# Patient Record
Sex: Male | Born: 1945
Health system: Southern US, Community
[De-identification: ages and names within clinical notes are randomized; demographics above are authoritative.]

## PROBLEM LIST (undated history)

## (undated) DIAGNOSIS — J45909 Unspecified asthma, uncomplicated: Secondary | ICD-10-CM

## (undated) DIAGNOSIS — H269 Unspecified cataract: Secondary | ICD-10-CM

## (undated) DIAGNOSIS — J8410 Pulmonary fibrosis, unspecified: Secondary | ICD-10-CM

## (undated) DIAGNOSIS — J84112 Idiopathic pulmonary fibrosis: Secondary | ICD-10-CM

## (undated) DIAGNOSIS — R0600 Dyspnea, unspecified: Secondary | ICD-10-CM

## (undated) DIAGNOSIS — H9190 Unspecified hearing loss, unspecified ear: Secondary | ICD-10-CM

## (undated) DIAGNOSIS — I4891 Unspecified atrial fibrillation: Secondary | ICD-10-CM

## (undated) DIAGNOSIS — R079 Chest pain, unspecified: Secondary | ICD-10-CM

## (undated) DIAGNOSIS — K219 Gastro-esophageal reflux disease without esophagitis: Secondary | ICD-10-CM

## (undated) DIAGNOSIS — E785 Hyperlipidemia, unspecified: Secondary | ICD-10-CM

## (undated) DIAGNOSIS — H409 Unspecified glaucoma: Secondary | ICD-10-CM

## (undated) DIAGNOSIS — M25512 Pain in left shoulder: Secondary | ICD-10-CM

## (undated) DIAGNOSIS — M1990 Unspecified osteoarthritis, unspecified site: Secondary | ICD-10-CM

## (undated) DIAGNOSIS — I219 Acute myocardial infarction, unspecified: Secondary | ICD-10-CM

## (undated) DIAGNOSIS — M47812 Spondylosis without myelopathy or radiculopathy, cervical region: Secondary | ICD-10-CM

## (undated) DIAGNOSIS — Z789 Other specified health status: Secondary | ICD-10-CM

## (undated) DIAGNOSIS — I10 Essential (primary) hypertension: Secondary | ICD-10-CM

## (undated) DIAGNOSIS — L409 Psoriasis, unspecified: Secondary | ICD-10-CM

## (undated) HISTORY — DX: Chest pain, unspecified: R07.9

## (undated) HISTORY — DX: Pain in left shoulder: M25.512

## (undated) HISTORY — DX: Spondylosis without myelopathy or radiculopathy, cervical region: M47.812

## (undated) HISTORY — DX: Hyperlipidemia, unspecified: E78.5

## (undated) HISTORY — PX: NECK SURGERY: SHX720

## (undated) HISTORY — PX: COLONOSCOPY: SHX174

## (undated) HISTORY — DX: Gastro-esophageal reflux disease without esophagitis: K21.9

## (undated) HISTORY — DX: Pulmonary fibrosis, unspecified: J84.10

## (undated) HISTORY — DX: Psoriasis, unspecified: L40.9

## (undated) HISTORY — DX: Other specified health status: Z78.9

## (undated) HISTORY — DX: Unspecified cataract: H26.9

## (undated) HISTORY — DX: Unspecified hearing loss, unspecified ear: H91.90

## (undated) HISTORY — PX: BRAIN SURGERY: SHX531

---

## 2000-11-04 ENCOUNTER — Ambulatory Visit (HOSPITAL_COMMUNITY): Admission: RE | Admit: 2000-11-04 | Discharge: 2000-11-04 | Payer: Self-pay | Admitting: Family Medicine

## 2000-11-04 ENCOUNTER — Encounter: Payer: Self-pay | Admitting: Family Medicine

## 2000-11-06 ENCOUNTER — Ambulatory Visit (HOSPITAL_COMMUNITY): Admission: RE | Admit: 2000-11-06 | Discharge: 2000-11-07 | Payer: Self-pay | Admitting: Neurosurgery

## 2008-11-16 ENCOUNTER — Ambulatory Visit (HOSPITAL_COMMUNITY): Admission: RE | Admit: 2008-11-16 | Discharge: 2008-11-16 | Payer: Self-pay | Admitting: Family Medicine

## 2009-01-07 DIAGNOSIS — M25512 Pain in left shoulder: Secondary | ICD-10-CM

## 2009-01-07 HISTORY — DX: Pain in left shoulder: M25.512

## 2009-03-17 ENCOUNTER — Ambulatory Visit (HOSPITAL_COMMUNITY): Admission: RE | Admit: 2009-03-17 | Discharge: 2009-03-17 | Payer: Self-pay | Admitting: Family Medicine

## 2009-03-23 ENCOUNTER — Ambulatory Visit (HOSPITAL_COMMUNITY): Admission: RE | Admit: 2009-03-23 | Discharge: 2009-03-23 | Payer: Self-pay | Admitting: Family Medicine

## 2009-04-27 ENCOUNTER — Ambulatory Visit (HOSPITAL_COMMUNITY): Admission: RE | Admit: 2009-04-27 | Discharge: 2009-04-27 | Payer: Self-pay | Admitting: Family Medicine

## 2009-08-15 ENCOUNTER — Ambulatory Visit (HOSPITAL_COMMUNITY): Admission: RE | Admit: 2009-08-15 | Discharge: 2009-08-15 | Payer: Self-pay | Admitting: Family Medicine

## 2009-10-19 ENCOUNTER — Ambulatory Visit (HOSPITAL_COMMUNITY)
Admission: RE | Admit: 2009-10-19 | Discharge: 2009-10-19 | Payer: Self-pay | Source: Home / Self Care | Admitting: Family Medicine

## 2009-12-08 ENCOUNTER — Ambulatory Visit (HOSPITAL_COMMUNITY)
Admission: RE | Admit: 2009-12-08 | Discharge: 2009-12-08 | Payer: Self-pay | Source: Home / Self Care | Admitting: Neurosurgery

## 2009-12-15 ENCOUNTER — Ambulatory Visit (HOSPITAL_COMMUNITY)
Admission: RE | Admit: 2009-12-15 | Discharge: 2009-12-15 | Payer: Self-pay | Source: Home / Self Care | Attending: Neurosurgery | Admitting: Neurosurgery

## 2010-01-07 DIAGNOSIS — J8410 Pulmonary fibrosis, unspecified: Secondary | ICD-10-CM

## 2010-01-07 HISTORY — DX: Pulmonary fibrosis, unspecified: J84.10

## 2010-01-28 ENCOUNTER — Encounter: Payer: Self-pay | Admitting: Family Medicine

## 2010-02-01 ENCOUNTER — Ambulatory Visit: Admit: 2010-02-01 | Payer: Self-pay | Admitting: Pulmonary Disease

## 2010-02-16 ENCOUNTER — Institutional Professional Consult (permissible substitution): Payer: Self-pay | Admitting: Pulmonary Disease

## 2010-03-16 ENCOUNTER — Institutional Professional Consult (permissible substitution) (INDEPENDENT_AMBULATORY_CARE_PROVIDER_SITE_OTHER): Payer: Medicare PPO | Admitting: Pulmonary Disease

## 2010-03-16 ENCOUNTER — Other Ambulatory Visit: Payer: Self-pay | Admitting: Pulmonary Disease

## 2010-03-16 ENCOUNTER — Encounter: Payer: Self-pay | Admitting: Pulmonary Disease

## 2010-03-16 ENCOUNTER — Other Ambulatory Visit: Payer: Medicare PPO

## 2010-03-16 DIAGNOSIS — L409 Psoriasis, unspecified: Secondary | ICD-10-CM | POA: Insufficient documentation

## 2010-03-16 DIAGNOSIS — R0989 Other specified symptoms and signs involving the circulatory and respiratory systems: Secondary | ICD-10-CM

## 2010-03-16 DIAGNOSIS — J8410 Pulmonary fibrosis, unspecified: Secondary | ICD-10-CM

## 2010-03-16 DIAGNOSIS — J309 Allergic rhinitis, unspecified: Secondary | ICD-10-CM | POA: Insufficient documentation

## 2010-03-16 DIAGNOSIS — R0609 Other forms of dyspnea: Secondary | ICD-10-CM | POA: Insufficient documentation

## 2010-03-16 LAB — SEDIMENTATION RATE: Sed Rate: 20 mm/hr (ref 0–22)

## 2010-03-20 ENCOUNTER — Telehealth (INDEPENDENT_AMBULATORY_CARE_PROVIDER_SITE_OTHER): Payer: Self-pay | Admitting: *Deleted

## 2010-03-27 ENCOUNTER — Ambulatory Visit (INDEPENDENT_AMBULATORY_CARE_PROVIDER_SITE_OTHER): Payer: Medicare PPO | Admitting: Pulmonary Disease

## 2010-03-27 DIAGNOSIS — J8410 Pulmonary fibrosis, unspecified: Secondary | ICD-10-CM

## 2010-03-27 LAB — PULMONARY FUNCTION TEST

## 2010-03-27 NOTE — Progress Notes (Signed)
Summary: returning call  Phone Note Call from Patient Call back at Home Phone [phone]   Caller: Patient Call For: clance Summary of Call: Returning Megan's call. Initial call taken by: Darletta Moll,  March 20, 2010 12:40 PM  Follow-up for Phone Call        Spoke with pt and notified of the lab results per [facility] append.  Pt verbalized understanding and denied any questions.  Follow-up by: Vernie Murders,  March 20, 2010 12:48 PM

## 2010-03-27 NOTE — Progress Notes (Signed)
PFT performed today. 

## 2010-04-04 ENCOUNTER — Encounter: Payer: Self-pay | Admitting: Pulmonary Disease

## 2010-04-04 DIAGNOSIS — J8410 Pulmonary fibrosis, unspecified: Secondary | ICD-10-CM

## 2010-04-05 ENCOUNTER — Telehealth: Payer: Self-pay | Admitting: Pulmonary Disease

## 2010-04-05 NOTE — Assessment & Plan Note (Signed)
Summary: consult for evaluation of ISLD   Visit Type:  Initial Consult Copy to:  Lilyan Punt Primary Provider/Referring Provider:  Lilyan Punt  CC:  Pulmonary Consult.  Pt c/o difficulty breathing x 1 year.  Pt states he has "scar tissue in his lungs." .  History of Present Illness: the pt is a 65y/o male who I have been asked to see for ISLD noted on ct chest.  The pt has doe over the last 5-68yrs, and has been especially progressive over the last 2 yrs.  He has doe primarily with heavier exertional activities, but at times can get winded with lighter activities.  He thinks he can walk a mile or more at moderate pace without getting sob, but will get winded walking up one flight of stairs.  He denies chronic cough, but can have intermittantly that is dry in nature.  He denies any significant occupational exposures, though he did have very limited asbestos exposure while in the service (no intense or prolonged exposure).  He denies any symptoms c/w autoimmune disease, though does have joint discomfort.  He has never been on macrodantin or amiodarone.  He has had spirometry which shows no obstruction and definite restriction.  His ct chest shows subpleural fibrotic changes with some pleural thickening (no calcified pleural plaque).    Preventive Screening-Counseling & Management  Alcohol-Tobacco     Smoking Status: quit  Current Medications (verified): 1)  Advair Diskus 500-50 Mcg/dose  Misc (Fluticasone-Salmeterol) .... Use As Needed 2)  Aleve 220 Mg Tabs (Naproxen Sodium) .... Take 2 Tabs By Mouth Daily 3)  Prilosec Otc 20 Mg Tbec (Omeprazole Magnesium) .... Take 1 Tab By Mouth Every Other Day 4)  Hydrochlorothiazide 25 Mg Tabs (Hydrochlorothiazide) .... Take 1 Tablet By Mouth Once A Day 5)  Ventolin Hfa 108 (90 Base) Mcg/act  Aers (Albuterol Sulfate) .Marland Kitchen.. 1-2 Puffs Every 4-6 Hours As Needed  Allergies (verified): 1)  ! Prednisone  Past History:  Past Medical History:  ALLERGIC  RHINITIS (ICD-477.9) HYPERLIPIDEMIA (ICD-272.4) HYPERTENSION (ICD-401.9) PSORIASIS (ICD-696.1)    Past Surgical History: neck surgery d/t ruptured disc 2002  Family History: Reviewed history and no changes required. allergies: mother, brother, sisters asthma: mother rheumatism: mother, sisters cancer: sister (breast)   Social History: Reviewed history and no changes required. Patient states former smoker.  started at age 63.  1/2 ppd.  Quit Fall 2009.  pt is married and lives with spouse. pt has children. pt is a retired Music therapist. Smoking Status:  quit  Review of Systems       The patient complains of shortness of breath with activity, productive cough, headaches, nasal congestion/difficulty breathing through nose, hand/feet swelling, joint stiffness or pain, and rash.  The patient denies shortness of breath at rest, non-productive cough, coughing up blood, chest pain, irregular heartbeats, acid heartburn, indigestion, loss of appetite, weight change, abdominal pain, difficulty swallowing, sore throat, tooth/dental problems, sneezing, itching, ear ache, anxiety, depression, change in color of mucus, and fever.    Vital Signs:  Patient profile:   65 year old male Height:      73 inches Weight:      266.38 pounds BMI:     35.27 O2 Sat:      91 % on Room air Temp:     97.9 degrees F oral Pulse rate:   79 / minute BP sitting:   140 / 82  (left arm) Cuff size:   large  Vitals Entered By: Arman Filter LPN (March 16, 2010 2:30 PM)  O2 Flow:  Room air  Serial Vital Signs/Assessments:  Comments: 3:53 PM Ambulatory Pulse Oximetry  Resting; HR__89___    02 Sat_93%ra____  Lap1 (185 feet)   HR___104__   02 Sat__93%ra___ Lap2 (185 feet)   HR___108__   02 Sat___92%ra__    Lap3 (185 feet)   HR___112__   02 Sat__86%ra___  _X__Test Completed without Difficulty ___Test Stopped due to:  Arman Filter LPN  March 15, 9145 3:54 PM  By: Arman Filter LPN   CC: Pulmonary  Consult.  Pt c/o difficulty breathing x 1 year.  Pt states he has "scar tissue in his lungs."  Comments Medications reviewed with patient Arman Filter LPN  March 15, 8293 2:43 PM    Physical Exam  General:  ow male in nad  Eyes:  PERRLA and EOMI.   Nose:  patent with mild septal deviation to left Mouth:  clear, no exudates or lesions seen  Neck:  no jvd, tmg, LN Lungs:  crackles 1/3 up bilat, no wheezing  Heart:  rrr, no mrg  Abdomen:  soft and nontender, bs+ Extremities:  mild ankle edema, no cyanosis  pulses intact distally Neurologic:  alert and oriented, moves all 4  Skin:  psoriasis changes on UE, LE   Impression & Recommendations:  Problem # 1:  INTERSTITIAL LUNG DISEASE (ICD-515) the pt has ISLD on his ct chest that is in a pattern most suspicious for IPF.  However, he did have some asbestos exposure in the past, and has some areas of pleural thickening.  However, the pattern of fibrosis is not typical for that seen with asbestosis.  Although the pt is symptomatic, he still maintains a very high QOL.  At this point, would like to establish his baseline with full pfts, and also r/o possible autoimmune process.  I have had a long discussion with him about ISLD, the different causes, and some of the longterm ramifications.  If this is IPF, the only treatment shown to be effective at this time is transplant.  Obviously, he is not at that point currently.  I also have discussed with him the ongoing trials with the drug pirfenadone, accepted in [place] as a disease "stabilizer", but requiring additional trials in the [place] before being accepted.  Our practice is participating in the trial, and offered him the option of being screened for this.  He will consider.  He did desaturate in the office today with rapid ambulation.  I offered him supplemental oxygen for home with heavier exertional activities, but he did not think he would wear it.  Will call if he changes his mind.  Finally, would  consider possible referral to pulmonary rehab at [facility], but the pt feels he is motivated enough on his own.  Medications Added to Medication List This Visit: 1)  Advair Diskus 500-50 Mcg/dose Misc (Fluticasone-salmeterol) .... Use as needed 2)  Aleve 220 Mg Tabs (Naproxen sodium) .... Take 2 tabs by mouth daily 3)  Prilosec Otc 20 Mg Tbec (Omeprazole magnesium) .... Take 1 tab by mouth every other day 4)  Hydrochlorothiazide 25 Mg Tabs (Hydrochlorothiazide) .... Take 1 tablet by mouth once a day 5)  Ventolin Hfa 108 (90 Base) Mcg/act Aers (Albuterol sulfate) .Marland Kitchen.. 1-2 puffs every 4-6 hours as needed  Other Orders: Consultation Level V (62130) T-Angiotensin i-Converting Enzyme (86578-46962) T-Antinuclear Antib (ANA) (95284-13244) T-Rheumatoid Factor (01027-25366) TLB-Sedimentation Rate (ESR) (85652-ESR) Pulse Oximetry, Ambulatory (44034)  Patient Instructions: 1)  will check bloodwork today to  evaluate for autoimmune disease 2)  will schedule for full breathing studies, and call you with the results. 3)  work on weight loss, exercise program 4)  followup with me in 6mos, or sooner if you feel your breathing is worsening.

## 2010-04-05 NOTE — Telephone Encounter (Signed)
Called and spoke with pt.  Pt aware of PFT results and KC's recs.  Pt verbalized understanding and denied any questions.   

## 2010-04-05 NOTE — Telephone Encounter (Signed)
Let pt know that his breathing tests show his lungs are moderately restricted, as expected.  Keep apptm with me as discussed.

## 2010-04-11 ENCOUNTER — Encounter: Payer: Self-pay | Admitting: Pulmonary Disease

## 2010-05-25 NOTE — Op Note (Signed)
[place]  Patient:    Curtis [name], Curtis [name] Visit Number: [id number] MRN: [id number]          Service Type: DSU Location: [id number] Attending Physician:  Cristi Loron Dictated by:   Cristi Loron, M.D. Proc. Date: 11/06/00 Admit Date:  11/06/2000 Discharge Date: 11/07/2000                             Operative Report  PREOPERATIVE DIAGNOSES:  C4-5 herniated nucleus pulposus, spinal stenosis, degenerative disk disease, cervical radiculopathy, cervicalgia.  POSTOPERATIVE DIAGNOSES:  C4-5 herniated nucleus pulposus, spinal stenosis, degenerative disk disease, cervical radiculopathy, cervicalgia.  PROCEDURES:  C4-5 extensive anterior cervical diskectomy, interbody iliac crest allograft arthrodesis, anterior cervical plating (Codman titanium plate and screws).  SURGEON:  Cristi Loron, M.D.  ASSISTANT:  Stefani Dama, M.D.  ANESTHESIA:  General endotracheal.  ESTIMATED BLOOD LOSS:  100 cc.  SPECIMENS:  None.  DRAINS:  None.  COMPLICATIONS:  None.  BRIEF HISTORY:  This patient is a 65 year old white male who has suffered from approximately two weeks of severe neck and left shoulder pain.  He has developed significant left deltoid weakness approximately six days ago.  He has been worked up with a cervical MRI, which demonstrated a large herniated disk at C4-5 on the left as well as degenerative disk disease and spondylosis at C5-6 and C6-7, predominantly anteriorly.  I recommended surgery at C4-5, and the patient weighed the risks, benefits, and alternatives of surgery and decided to proceed with the operation.  DESCRIPTION OF PROCEDURE:  The patient was brought to the operating room by the anesthesia team.  General endotracheal anesthesia was induced.  The patient remained in supine position.  A roll was placed under the patients shoulders to place his neck in slight extension.  The anterior cervical region was  then prepared with Betadine scrub and Betadine solution, and sterile drapes were applied.  I then injected the area to be incised with Marcaine with epinephrine solution and used a scalpel to make a transverse incision in the patients left anterior neck.  I used the Metzenbaum scissors to divide the platysma muscle and dissect medial to the sternocleidomastoid muscle, jugular vein, and carotid artery.  I bluntly dissected down toward the anterior cervical spine, carefully identifying the esophagus and retracting it medially.  I cleared soft tissue from the exposed interspace using the Kitner swabs.  I inserted a bent spinal needle into the exposed interspace and obtained the intraoperative radiograph.  It demonstrated we were at C4-5.  I used the electrocautery to detach the medial border of the longus colli muscle bilaterally from the C4-5 interspace.  Of note, there were large bone spurs anteriorly at C5-6 and C6-7.  I inserted the Caspar self-retaining retractor for exposure, and then I incised the C4-5 intervertebral disk with the 15 blade scalpel.  I performed a partial diskectomy using the pituitary forceps. I then inserted the distraction screws at C4 and C5, distracted the interspace, and then I used the high-speed drill to decorticate the vertebral end plates at X3-2, drill away the remainder of the C4-5 intervertebral disk. I thinned out the posterior longitudinal ligament with the drill, incised it with the arachnoid knife, and removed it with the Kerrison punch, undercutting the vertebral end plates at G4-0, decompressing the thecal sac.  There was a large herniated disk at C4-5 on the left, which I removed with a pituitary  forceps and the Kerrison punch.  I performed a foraminotomy about the bilateral C5 nerve root.  At this point I had a good decompression of the thecal sac and the bilateral C5 nerve roots.  I now turned my attention to the arthrodesis.  I obtained the iliac  crest tricortical allograft bone graft and fashioned it to these approximate dimensions:  8 mm in height, 1 cm in depth.  I then inserted the bone graft into the distracted C4-5 interspace.  I removed the distraction screws.  There was a good, snug fit of the bone graft.  I now turned my attention to the anterior spinal instrumentation and obtained the appropriate length Codman anterior cervical plate, laid it along the anterior aspect of C4 and C5 after drilling away some bone spurs from the C5 vertebral body anteriorly.  I drilled two holes at C4, two at C5, tapped the holes, and secured the plate to the vertebral bodies with two 15 mm screws at each level.  I then obtained the intraoperative radiograph, and it demonstrated good position of the plate, screws, and interbody graft at C4-5. I then secured the screws to the plate using the cam tightener at each screw. I then achieved stringent hemostasis using bipolar electrocautery and Gelfoam. I copiously irrigated the wound with bacitracin solution.  I removed the solution and then removed the Caspar self-retaining retractor and inspected the esophagus for any damage.  There was none apparent.  I then reapproximated the patients platysma muscle with interrupted 3-0 Vicryl suture, subcutaneous tissue with interrupted 3-0 Vicryl suture, and the skin with Steri-Strips and benzoin.  The wound was then coated with bacitracin ointment and a sterile dressing was applied, the drapes were removed, and the patient was subsequently extubated by the anesthesia team and transported to the postanesthesia care unit in stable condition.  All sponge, instrument, and needle counts were correct at the end of this case. Dictated by:   Cristi Loron, M.D. Attending Physician:  Tressie Stalker D DD:  11/06/00 TD:  11/08/00 Job: [id number]

## 2010-05-30 ENCOUNTER — Telehealth: Payer: Self-pay | Admitting: Pulmonary Disease

## 2010-05-30 DIAGNOSIS — R0602 Shortness of breath: Secondary | ICD-10-CM

## 2010-05-30 NOTE — Telephone Encounter (Signed)
I spoke to the pt and he states he will wear the oxygen with activity, so per [facility] send order for 2 liters with exertion. Order placed. Pt aware.Carron Curie, CMA

## 2010-05-30 NOTE — Telephone Encounter (Signed)
Spouse advised pt is c/o DOE with the warmer days and is requesting O2 therapy. Per KC last OV from 03/12 "He did desaturate in the office today with rapid ambulation. I offered him supplemental oxygen for home with heavier exertional activities, but he did not think he would wear it. Will call if he changes his mind." Pt does not have a DME. Please advise. Thank you.

## 2010-05-30 NOTE — Telephone Encounter (Signed)
The pt's spouse is requesting oxygen, but will the pt wear it.  Please ask the pt if he will wear it, and if so will send an order to [facility].

## 2010-05-30 NOTE — Telephone Encounter (Signed)
ATC pt line busy and no option to leave msg

## 2010-05-30 NOTE — Telephone Encounter (Signed)
PLEASE CALL BACK AT [phone]

## 2010-05-30 NOTE — Telephone Encounter (Signed)
Also advise spouse that she will receive a call back tomorrow, she is okay with this and can be reached at her work # [phone].

## 2010-06-01 ENCOUNTER — Ambulatory Visit (INDEPENDENT_AMBULATORY_CARE_PROVIDER_SITE_OTHER): Payer: Medicare PPO | Admitting: Pulmonary Disease

## 2010-06-01 ENCOUNTER — Ambulatory Visit: Payer: Medicare PPO | Admitting: Pulmonary Disease

## 2010-06-01 ENCOUNTER — Encounter: Payer: Self-pay | Admitting: Pulmonary Disease

## 2010-06-01 DIAGNOSIS — R0602 Shortness of breath: Secondary | ICD-10-CM

## 2010-06-01 NOTE — Progress Notes (Signed)
SATURATION QUALIFICATIONS:  Patient Saturations on Room Air at Rest = 93%  Patient Saturations on Room Air while Ambulating = 86%  Patient Saturations on 2 Liters of oxygen while Ambulating = 93%  Arman Filter, LPN

## 2010-06-01 NOTE — Progress Notes (Signed)
SATURATION QUALIFICATIONS:  Patient Saturations on Room Air at Rest = 93%  Patient Saturations on Room Air while Ambulating = 86%  Patient Saturations on 2 Liters of oxygen while Ambulating = 93%  Romain Erion, LPN  

## 2010-06-14 ENCOUNTER — Telehealth: Payer: Self-pay | Admitting: *Deleted

## 2010-06-15 NOTE — Telephone Encounter (Signed)
Faxed notes.Curtis [name]

## 2010-09-19 ENCOUNTER — Encounter: Payer: Self-pay | Admitting: Pulmonary Disease

## 2010-09-19 ENCOUNTER — Ambulatory Visit (INDEPENDENT_AMBULATORY_CARE_PROVIDER_SITE_OTHER): Payer: Medicare PPO | Admitting: Pulmonary Disease

## 2010-09-19 VITALS — BP 140/82 | HR 81 | Temp 97.8°F | Ht 73.0 in | Wt 271.0 lb

## 2010-09-19 DIAGNOSIS — J8410 Pulmonary fibrosis, unspecified: Secondary | ICD-10-CM

## 2010-09-19 NOTE — Assessment & Plan Note (Addendum)
The patient has interstitial lung disease of unknown origin, but continues to be very functional and has seen little change overall in his breathing.  At this point, both he and I would like to follow him clinically with yearly chest x-rays and PFTs, and he will let us know if subjectively his symptoms worsen.  I suspect this is IPF, and therefore no good treatment is available other than transplantation.  The patient is not interested in the ascend trial at this time.

## 2010-09-19 NOTE — Patient Instructions (Signed)
Will check cxr next visit Will schedule for breathing tests in 6mos, and see you back same day for review. Stay active, and call if you feel your breathing is becoming an issue.

## 2010-09-19 NOTE — Progress Notes (Signed)
  Subjective:    Patient ID: Curtis [name], male    DOB: 03/24/1945, 65 y.o.   MRN: [id number]  HPI The pt comes in today for f/u of his known ISLD or unknown origin.  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  He had a little more shortness of breath this summer in the high heat and humidity, but this is much improved since the temperatures have gotten cooler.  He denies any significant cough or mucus production.   Review of Systems  Constitutional: Negative for fever and unexpected weight change.  HENT: Negative for ear pain, nosebleeds, congestion, sore throat, rhinorrhea, sneezing, trouble swallowing, dental problem, postnasal drip and sinus pressure.   Eyes: Positive for redness and itching.  Respiratory: Positive for cough, shortness of breath and wheezing. Negative for chest tightness.   Cardiovascular: Negative for palpitations and leg swelling.  Gastrointestinal: Negative for nausea and vomiting.  Genitourinary: Negative for dysuria.  Musculoskeletal: Negative for joint swelling.  Skin: Negative for rash.  Neurological: Positive for headaches.  Hematological: Does not bruise/bleed easily.  Psychiatric/Behavioral: Negative for dysphoric mood. The patient is not nervous/anxious.        Objective:   Physical Exam Ow male in nad Nose without purulence or discharge noted. Chest with basilar crackles, no wheezing or rhonchi Cor with rrr LE with mild ankle edema, no cyanosis Alert and oriented, moves all 4        Assessment & Plan:

## 2010-09-19 NOTE — Progress Notes (Signed)
Addended by: Salli Quarry on: 09/19/2010 09:53 AM   Modules accepted: Orders

## 2011-03-21 ENCOUNTER — Other Ambulatory Visit: Payer: Self-pay | Admitting: Pulmonary Disease

## 2011-03-21 DIAGNOSIS — J8410 Pulmonary fibrosis, unspecified: Secondary | ICD-10-CM

## 2011-03-22 ENCOUNTER — Ambulatory Visit (INDEPENDENT_AMBULATORY_CARE_PROVIDER_SITE_OTHER): Payer: Medicare Other | Admitting: Pulmonary Disease

## 2011-03-22 ENCOUNTER — Ambulatory Visit (INDEPENDENT_AMBULATORY_CARE_PROVIDER_SITE_OTHER)
Admission: RE | Admit: 2011-03-22 | Discharge: 2011-03-22 | Disposition: A | Discharge status: Home / Self Care | Payer: Medicare Other | Source: Ambulatory Visit | Attending: Pulmonary Disease | Admitting: Pulmonary Disease

## 2011-03-22 ENCOUNTER — Encounter: Payer: Self-pay | Admitting: Pulmonary Disease

## 2011-03-22 VITALS — BP 146/90 | HR 86 | Temp 97.9°F | Ht 74.0 in | Wt 268.0 lb

## 2011-03-22 DIAGNOSIS — J8410 Pulmonary fibrosis, unspecified: Secondary | ICD-10-CM

## 2011-03-22 LAB — PULMONARY FUNCTION TEST

## 2011-03-22 NOTE — Progress Notes (Signed)
PFT done today. 

## 2011-03-22 NOTE — Assessment & Plan Note (Signed)
The patient has known interstitial disease that has been fairly stable from a subjective and objective standpoint.  His chest x-ray is unchanged today as are his symptoms.  His pulmonary function studies show a 600 cc decline in total lung capacity from a year ago, however his diffusion capacity is mildly improved.  He does have some centripetal obesity.  Given the fact that his CT appearance is most consistent with UIP, and that his diagnosis is likely IPF, we will continue to follow him.  If he has a significant decline in objective or subjective symptoms, he may require a lung biopsy.  He has not been interested in investigational studies, however I would start him on pirfenidone if approved by the FDA.

## 2011-03-22 NOTE — Patient Instructions (Signed)
No change in treatment plans for now except consider coming off advair for a trial Work on weight loss and conditioning Wear your oxygen with consistent exertional activities as much as possible.  followup with me in 6mos, but call if breathing changes.

## 2011-03-22 NOTE — Progress Notes (Signed)
  Subjective:    Patient ID: Curtis [name], male    DOB: May 02, 1945, 66 y.o.   MRN: [id number]  HPI The patient comes in today for followup of his known interstitial disease.  He is felt to have IPF on the basis of his CT chest and a negative autoimmune workup.  The patient has been very functional, and has not had a significant change in objective parameters such as chest x-ray and PFTs.  He comes in today where he feels he is near his usual baseline, but clearly gets winded with moderate to heavy exertional activities.  He is not wearing his oxygen and with heavy exertion compliant light.  He has very little cough at this time.  The patient is taking Advair, but does not have air flow obstruction by his pulmonary function studies.  I have asked him to consider coming off of this.   Review of Systems  Constitutional: Negative for fever and unexpected weight change.  HENT: Positive for congestion, rhinorrhea and postnasal drip. Negative for ear pain, nosebleeds, sore throat, sneezing, trouble swallowing, dental problem and sinus pressure.   Eyes: Negative for redness and itching.  Respiratory: Positive for cough, shortness of breath and wheezing. Negative for chest tightness.   Cardiovascular: Negative for palpitations and leg swelling.  Gastrointestinal: Negative for nausea and vomiting.  Genitourinary: Negative for dysuria.  Musculoskeletal: Negative for joint swelling.  Skin: Negative for rash.  Neurological: Negative for headaches.  Hematological: Does not bruise/bleed easily.  Psychiatric/Behavioral: Negative for dysphoric mood. The patient is not nervous/anxious.        Objective:   Physical Exam Overweight male in no acute distress Nose without purulence or discharge noted Chest with faint basilar crackles, no wheezes or rhonchi Cardiac exam with regular rate and rhythm Lower extremities without significant edema, No cyanosis Alert and oriented, moves all 4  extremities.       Assessment & Plan:

## 2011-09-27 ENCOUNTER — Ambulatory Visit: Payer: Medicare Other | Admitting: Pulmonary Disease

## 2011-10-04 ENCOUNTER — Ambulatory Visit (INDEPENDENT_AMBULATORY_CARE_PROVIDER_SITE_OTHER): Payer: Medicare Other | Admitting: Pulmonary Disease

## 2011-10-04 ENCOUNTER — Encounter: Payer: Self-pay | Admitting: Pulmonary Disease

## 2011-10-04 VITALS — BP 132/88 | HR 70 | Temp 97.7°F | Ht 73.0 in | Wt 265.8 lb

## 2011-10-04 DIAGNOSIS — J8410 Pulmonary fibrosis, unspecified: Secondary | ICD-10-CM

## 2011-10-04 NOTE — Assessment & Plan Note (Signed)
The patient has been stable from a pulmonary standpoint since the last visit.  I have encouraged him to work aggressively on weight loss and some type of conditioning program.  He is due for followup in 6 months, and we will do his yearly PFTs and chest x-ray at that visit.

## 2011-10-04 NOTE — Patient Instructions (Addendum)
Continue working on weight loss and conditioning.  Will see you back in 6mos with breathing tests and cxr the same day.

## 2011-10-04 NOTE — Progress Notes (Signed)
  Subjective:    Patient ID: Curtis [name], male    DOB: [birth year], 66 y.o.   MRN: [id number]  HPI Patient comes in today for followup of his known interstitial lung disease, felt secondary to IPF.  He is doing very well from a breathing standpoint, and feels that his exertional tolerance is quite stable.  He does not have any significant cough or chest discomfort.  He has decided to stay on the Advair because he thinks it helps with his breathing during the day.   Review of Systems  Constitutional: Negative for fever and unexpected weight change.  HENT: Negative for ear pain, nosebleeds, congestion, sore throat, rhinorrhea, sneezing, trouble swallowing, dental problem, postnasal drip and sinus pressure.   Eyes: Negative for redness and itching.  Respiratory: Negative for cough, chest tightness, shortness of breath and wheezing.   Cardiovascular: Negative for palpitations and leg swelling.  Gastrointestinal: Negative for nausea and vomiting.  Genitourinary: Negative for dysuria.  Musculoskeletal: Negative for joint swelling.  Skin: Negative for rash.  Neurological: Positive for headaches.  Hematological: Does not bruise/bleed easily.  Psychiatric/Behavioral: Negative for dysphoric mood. The patient is not nervous/anxious.        Objective:   Physical Exam Overweight male in no acute distress Nose without purulence or discharge noted Chest with basilar crackles one third the way up bilaterally, no wheezing Cardiac exam is regular rate and rhythm Lower extremities with mild ankle edema, no cyanosis Alert and oriented, moves all 4 extremities.       Assessment & Plan:

## 2011-10-24 ENCOUNTER — Emergency Department (HOSPITAL_COMMUNITY): Payer: Medicare Other

## 2011-10-24 ENCOUNTER — Encounter (HOSPITAL_COMMUNITY): Payer: Self-pay | Admitting: Emergency Medicine

## 2011-10-24 ENCOUNTER — Emergency Department (HOSPITAL_COMMUNITY)
Admission: EM | Admit: 2011-10-24 | Discharge: 2011-10-24 | Disposition: A | Discharge status: Home / Self Care | Payer: Medicare Other | Attending: Emergency Medicine | Admitting: Emergency Medicine

## 2011-10-24 DIAGNOSIS — M129 Arthropathy, unspecified: Secondary | ICD-10-CM | POA: Insufficient documentation

## 2011-10-24 DIAGNOSIS — Z87891 Personal history of nicotine dependence: Secondary | ICD-10-CM | POA: Insufficient documentation

## 2011-10-24 DIAGNOSIS — I10 Essential (primary) hypertension: Secondary | ICD-10-CM | POA: Insufficient documentation

## 2011-10-24 DIAGNOSIS — E7800 Pure hypercholesterolemia, unspecified: Secondary | ICD-10-CM | POA: Insufficient documentation

## 2011-10-24 DIAGNOSIS — R079 Chest pain, unspecified: Secondary | ICD-10-CM | POA: Insufficient documentation

## 2011-10-24 HISTORY — DX: Essential (primary) hypertension: I10

## 2011-10-24 HISTORY — DX: Unspecified osteoarthritis, unspecified site: M19.90

## 2011-10-24 HISTORY — DX: Idiopathic pulmonary fibrosis: J84.112

## 2011-10-24 LAB — CBC WITH DIFFERENTIAL/PLATELET
Basophils Absolute: 0 10*3/uL (ref 0.0–0.1)
Basophils Relative: 0 % (ref 0–1)
Eosinophils Absolute: 0.6 10*3/uL (ref 0.0–0.7)
Eosinophils Relative: 6 % — ABNORMAL HIGH (ref 0–5)
Lymphs Abs: 3.2 10*3/uL (ref 0.7–4.0)
MCH: 31.5 pg (ref 26.0–34.0)
MCHC: 34.5 g/dL (ref 30.0–36.0)
MCV: 91.2 fL (ref 78.0–100.0)
Neutrophils Relative %: 55 % (ref 43–77)
Platelets: 110 10*3/uL — ABNORMAL LOW (ref 150–400)
RDW: 12.8 % (ref 11.5–15.5)

## 2011-10-24 LAB — BASIC METABOLIC PANEL
Calcium: 9.6 mg/dL (ref 8.4–10.5)
GFR calc Af Amer: >90 mL/min (ref >90)
GFR calc non Af Amer: 85 mL/min — ABNORMAL LOW (ref >90)
Glucose, Bld: 88 mg/dL (ref 70–99)
Potassium: 3.9 mEq/L (ref 3.5–5.1)
Sodium: 138 mEq/L (ref 135–145)

## 2011-10-24 MED ORDER — ASPIRIN 81 MG PO CHEW
324.0000 mg | CHEWABLE_TABLET | Freq: Once | ORAL | Status: AC
  Administered 2011-10-24: 324 mg via ORAL
  Filled 2011-10-24: qty 4

## 2011-10-24 NOTE — ED Notes (Signed)
Pt c/o chest pressure with tingling in both arms x 2-3 days

## 2011-10-24 NOTE — ED Notes (Signed)
Family member now states that pt is willing to stay (admit)

## 2011-10-24 NOTE — ED Provider Notes (Addendum)
History     CSN: [id number]  Arrival date & time 10/24/11  1842   First MD Initiated Contact with Patient 10/24/11 1857      Chief Complaint  Patient presents with  . Chest Pain    (Consider location/radiation/quality/duration/timing/severity/associated sxs/prior treatment) Patient is a 66 y.o. male presenting with chest pain. The history is provided by the patient.  Chest Pain   He has a long history of interstitial lung disease. He had onset yesterday of episodes of chest heaviness and tightness. Episodes will last approximately 5-10 seconds and resolve. He does notice increased dyspnea when these episodes occur. There is no associated nausea, vomiting, diaphoresis. He is ability to exert himself is severely impaired by his underlying lung disease, but he has not noted any exertional component to the pain. Nothing seems to be connected to when they come on and nothing seems to worsen them and nothing makes them better. He has not tried any treatment. He has not had similar episodes before.  Past Medical History  Diagnosis Date  . Hypertension   . High cholesterol   . Idiopathic pulmonary fibrosis   . Arthritis     Past Surgical History  Procedure Date  . Neck surgery     No family history on file.  History  Substance Use Topics  . Smoking status: Former Smoker -- 0.3 packs/day for 52 years    Types: Cigarettes    Quit date: 09/07/2004  . Smokeless tobacco: Never Used  . Alcohol Use: No     quit drinking 2004      Review of Systems  Cardiovascular: Positive for chest pain.  All other systems reviewed and are negative.    Allergies  Prednisone  Home Medications   Current Outpatient Rx  Name Route Sig Dispense Refill  . ALBUTEROL SULFATE HFA 108 (90 BASE) MCG/ACT IN AERS Inhalation Inhale 2 puffs into the lungs every 6 (six) hours as needed.    . CHLORPHENIRAMINE MALEATE 4 MG PO TABS Oral Take 4 mg by mouth as needed.    Marland Kitchen FLUTICASONE-SALMETEROL 250-50  MCG/DOSE IN AEPB Inhalation Inhale 1 puff into the lungs every 12 (twelve) hours.    Marland Kitchen HYDROCHLOROTHIAZIDE 25 MG PO TABS Oral Take 25 mg by mouth daily.     . IBUPROFEN 200 MG PO TABS Oral Take 600 mg by mouth every 6 (six) hours as needed.    Marland Kitchen OMEPRAZOLE 20 MG PO CPDR Oral Take 20 mg by mouth as needed.      BP 150/90  Pulse 77  Temp 98 F (36.7 C)  Resp 18  Ht 6\' 1"  (1.854 m)  Wt 260 lb (117.935 kg)  BMI 34.30 kg/m2  SpO2 98%  Physical Exam  Nursing note and vitals reviewed. 66 year old male, resting comfortably and in no acute distress. Vital signs are significant for mild hypertension with blood pressure 150/90. Oxygen saturation is 98%, which is normal. Head is normocephalic and atraumatic. PERRLA, EOMI. Oropharynx is clear. Neck is nontender and supple without adenopathy or JVD. Back is nontender and there is no CVA tenderness. Lungs are clear without rales, wheezes, or rhonchi. Chest is nontender. Heart has regular rate and rhythm without murmur. Abdomen is soft, flat, nontender without masses or hepatosplenomegaly and peristalsis is normoactive. Extremities have no cyanosis or edema, full range of motion is present. Skin is warm and dry without rash. Neurologic: Mental status is normal, cranial nerves are intact, there are no motor or sensory deficits.  ED Course  Procedures (including critical care time)  Results for orders placed during the hospital encounter of 10/24/11  CBC WITH DIFFERENTIAL      Component Value Range   WBC 9.9  4.0 - 10.5 K/uL   RBC 4.99  4.22 - 5.81 MIL/uL   Hemoglobin 15.7  13.0 - 17.0 g/dL   HCT 16.1  09.6 - 04.5 %   MCV 91.2  78.0 - 100.0 fL   MCH 31.5  26.0 - 34.0 pg   MCHC 34.5  30.0 - 36.0 g/dL   RDW 40.9  81.1 - 91.4 %   Platelets 110 (*) 150 - 400 K/uL   Neutrophils Relative 55  43 - 77 %   Neutro Abs 5.5  1.7 - 7.7 K/uL   Lymphocytes Relative 32  12 - 46 %   Lymphs Abs 3.2  0.7 - 4.0 K/uL   Monocytes Relative 7  3 - 12 %    Monocytes Absolute 0.7  0.1 - 1.0 K/uL   Eosinophils Relative 6 (*) 0 - 5 %   Eosinophils Absolute 0.6  0.0 - 0.7 K/uL   Basophils Relative 0  0 - 1 %   Basophils Absolute 0.0  0.0 - 0.1 K/uL  BASIC METABOLIC PANEL      Component Value Range   Sodium 138  135 - 145 mEq/L   Potassium 3.9  3.5 - 5.1 mEq/L   Chloride 101  96 - 112 mEq/L   CO2 30  19 - 32 mEq/L   Glucose, Bld 88  70 - 99 mg/dL   BUN 9  6 - 23 mg/dL   Creatinine, Ser 7.82  0.50 - 1.35 mg/dL   Calcium 9.6  8.4 - 95.6 mg/dL   GFR calc non Af Amer 85 (*) >90 mL/min   GFR calc Af Amer >90  >90 mL/min  TROPONIN I      Component Value Range   Troponin I <0.30  <0.30 ng/mL   Dg Chest 2 View  10/24/2011  *RADIOLOGY REPORT*  Clinical Data: Chest pain.  Chronic pulmonary fibrosis.  CHEST - 2 VIEW  Comparison: 03/22/2011  Findings: Chronic subpleural interstitial lung disease is again seen which is unchanged in appearance.  There is no evidence of acute infiltrate or edema.  No evidence of pleural effusion.  Heart size is within normal limits.  No definite mass or lymphadenopathy identified.  IMPRESSION: Stable appearance of chronic interstitial lung disease.  No acute findings.   Original Report Authenticated By: Danae Orleans, M.D.       Date: 10/24/2011  Rate: 80  Rhythm: normal sinus rhythm  QRS Axis: normal  Intervals: normal  ST/T Wave abnormalities: nonspecific ST changes  Conduction Disutrbances:none  Narrative Interpretation: Left ventricular hypertrophy, minor nonspecific ST changes. When compared with ECG of 11/05/2000, no significant changes are seen.  Old EKG Reviewed: unchanged    1. Chest pain       MDM  Chest pain which is not likely to be cardiac. However, I do not see any evidence of any cardiac evaluation in the past. Cardiac markers will be to obtained as well as chest x-ray and he'll be given a dose of aspirin.  Workup is unremarkable. I have recommended hospital stay so they can be evaluated by a  cardiologist and set up for stress testing. Patient states he does not wish to be admitted to the hospital. That is not unreasonable since his pain is really quite atypical. I have discussed  his case with the cardiology fellow at [facility] who will be bored with [facility] cardiology to get him set up for a stress test in the next several days. Because of his lung disease, he will need to have a chemical stress tests since he cannot walk on a treadmill.  Dione Booze, MD 10/24/11 3875  Dione Booze, MD 10/24/11 2106

## 2011-10-24 NOTE — ED Notes (Signed)
Pt states that he does not want to be admitted and will discuss with Dr. Orvan Falconer, no IV access done at this time

## 2011-10-24 NOTE — ED Notes (Signed)
Pt placed on telemetry now pt in room with monitor

## 2011-10-28 ENCOUNTER — Encounter: Payer: Self-pay | Admitting: Cardiology

## 2011-10-29 ENCOUNTER — Encounter: Payer: Self-pay | Admitting: *Deleted

## 2011-10-30 ENCOUNTER — Encounter: Payer: Self-pay | Admitting: *Deleted

## 2011-10-30 ENCOUNTER — Ambulatory Visit (INDEPENDENT_AMBULATORY_CARE_PROVIDER_SITE_OTHER): Payer: Medicare Other | Admitting: Cardiology

## 2011-10-30 ENCOUNTER — Encounter: Payer: Self-pay | Admitting: Cardiology

## 2011-10-30 VITALS — BP 109/80 | HR 91 | Ht 73.0 in | Wt 259.0 lb

## 2011-10-30 DIAGNOSIS — J8410 Pulmonary fibrosis, unspecified: Secondary | ICD-10-CM

## 2011-10-30 DIAGNOSIS — Z888 Allergy status to other drugs, medicaments and biological substances status: Secondary | ICD-10-CM

## 2011-10-30 DIAGNOSIS — I10 Essential (primary) hypertension: Secondary | ICD-10-CM

## 2011-10-30 DIAGNOSIS — R079 Chest pain, unspecified: Secondary | ICD-10-CM

## 2011-10-30 DIAGNOSIS — Z789 Other specified health status: Secondary | ICD-10-CM | POA: Insufficient documentation

## 2011-10-30 DIAGNOSIS — K219 Gastro-esophageal reflux disease without esophagitis: Secondary | ICD-10-CM

## 2011-10-30 NOTE — Progress Notes (Signed)
Patient ID: Curtis [name], male   DOB: 20-Oct-1945, 66 y.o.   MRN: [id number]   HPI  The patient is seen for cardiac consultation for the evaluation of chest discomfort. The patient is very active. He has had an injury to his left arm related to cervical disc disease. He has had surgery in the past. Also he has known interstitial lung disease. This is followed by our pulmonary group in [place] office. The patient does have some GI reflux. He uses meds intermittently. More recently on October 17 he had a burning sensation that persisted in his upper chest. There was no nausea vomiting or diaphoresis. He did feel that it radiated up towards the shoulder that is chronically injured. He also said that he had a sensation in his left arm and tingling in both hands. He went to the emergency room. While he was there he was stable. He felt better with the aspirin he received. His troponin in the emergency room was normal. There was no acute EKG change. The discomfort has been primarily at rest. Thinks he may have had some with stress. He tells me today that his chest felt sore for several days after his emergency room visit. On October 18 he saw Dr.Scott Luking in his office. Decision was made for further cardiac evaluation. He does have risk factors for coronary disease including hypertension, high cholesterol. He has smoked in the past. It is my understanding that he quit in October, 2009.  Allergies  Allergen Reactions  . Prednisone     REACTION: gi upset, nausea    Current Outpatient Prescriptions  Medication Sig Dispense Refill  . albuterol (VENTOLIN HFA) 108 (90 BASE) MCG/ACT inhaler Inhale 2 puffs into the lungs every 6 (six) hours as needed. For shortness of breath      . betamethasone valerate ointment (VALISONE) 0.1 % Apply 1 application topically Once daily as needed. For psioriasis      . fexofenadine (ALLEGRA) 180 MG tablet Take 180 mg by mouth daily as needed. For allergies      .  Fluticasone-Salmeterol (ADVAIR) 250-50 MCG/DOSE AEPB Inhale 1 puff into the lungs daily.       . Glucosamine-Chondroitin (GLUCOSAMINE CHONDR COMPLEX PO) Take 1 tablet by mouth daily.      . hydrochlorothiazide 25 MG tablet Take 25 mg by mouth daily.       Marland Kitchen ibuprofen (ADVIL,MOTRIN) 200 MG tablet Take 600 mg by mouth every 6 (six) hours as needed. For pain/relief      . naproxen sodium (ALEVE) 220 MG tablet Take 440 mg by mouth daily as needed. For pain/relief      . omeprazole (PRILOSEC) 20 MG capsule Take 20 mg by mouth daily as needed. For acid reflux      . zolpidem (AMBIEN) 10 MG tablet Take 5-10 mg by mouth At bedtime as needed. For sleep        History   Social History  . Marital Status: Married    Spouse Name: N/A    Number of Children: N/A  . Years of Education: N/A   Occupational History  . Not on file.   Social History Main Topics  . Smoking status: Former Smoker -- 0.5 packs/day for 52 years    Types: Cigarettes    Quit date: 09/07/2004  . Smokeless tobacco: Never Used  . Alcohol Use: No     quit drinking 2004  . Drug Use: No  . Sexually Active: Not on file   Other Topics  Concern  . Not on file   Social History Narrative  . No narrative on file    No family history on file.  Past Medical History  Diagnosis Date  . Hypertension   . Dyslipidemia     Statin intolerant  . Idiopathic pulmonary fibrosis   . Arthritis   . Chest pain     Emergency room October 24, 2011, no MI  . Psoriasis   . Statin intolerance     Past Surgical History  Procedure Date  . Neck surgery     Patient Active Problem List  Diagnosis  . INTERSTITIAL LUNG DISEASE  . PSORIASIS  . Chest pain  . Hypertension  . Statin intolerance  . Dyslipidemia    ROS  Patient denies fever, chills, headache, sweats, rash, change in vision, change in hearing, cough, nausea vomiting, urinary symptoms. All other systems are reviewed and are negative.  PHYSICAL EXAM Patient is here with  family member. He is oriented to person time and place. Affect is normal. There is no jugulovenous distention. Lungs reveal decreased breath sounds. There is no respiratory distress. Cardiac exam her vitals S1 and S2. There are no clicks or significant murmurs. The abdomen is soft. Is no peripheral edema. He has skin lesions compatible with his psoriasis. There no musculoskeletal deformities.  Filed Vitals:   10/30/11 1311  BP: 109/80  Pulse: 91  Height: 6\' 1"  (1.854 m)  Weight: 259 lb (117.482 kg)  SpO2: 95%   EKG is done today and reviewed by me. There is no significant change since the tracing of October 24, 2011. However the very thin inferior Q waves are slightly more marked. They now register with the computer as inferior infarct, age undetermined.  ASSESSMENT & PLAN

## 2011-10-30 NOTE — Assessment & Plan Note (Signed)
Patient has lipid abnormalities. Unfortunately he is statin intolerant. We still do not have a good proven drug for the treatment of low HDL.

## 2011-10-30 NOTE — Assessment & Plan Note (Signed)
The patient does have significant lung disease. No further workup by me at this time.

## 2011-10-30 NOTE — Assessment & Plan Note (Signed)
At this point the patient does need further workup of his chest sensation of burning. I am not yet convinced that he has unstable cardiac symptoms. We are making plans for a Lexiscan nuclear stress test. He is not able to walk well. We will then arrange for early followup in our office. If his stress study is completely normal I would suggest following him with a followup visit in the office to be sure how he is doing over time. If he has signs of ischemia, proceeding with cardiac catheterization would be appropriate. I have kept him on his single dose of aspirin daily with food. I have not added any other medications.

## 2011-10-30 NOTE — Patient Instructions (Addendum)
Your physician recommends that you schedule a follow-up appointment in: 1 - 2 weeks  Your physician has requested that you have a lexiscan myoview. For further information please visit [email or web address] Please follow instruction sheet, as given.

## 2011-10-30 NOTE — Assessment & Plan Note (Signed)
I do think the patient is having some GERD symptoms. I've encouraged him to use omeprazole 30 minutes before breakfast and 30 minutes before supper for 2 full weeks.

## 2011-10-30 NOTE — Assessment & Plan Note (Signed)
Blood pressure is controlled today. No change in therapy. 

## 2011-11-04 ENCOUNTER — Encounter (HOSPITAL_COMMUNITY)
Admission: RE | Admit: 2011-11-04 | Discharge: 2011-11-04 | Disposition: A | Discharge status: Home / Self Care | Payer: Medicare Other | Source: Ambulatory Visit | Attending: Cardiology | Admitting: Cardiology

## 2011-11-04 ENCOUNTER — Encounter (HOSPITAL_COMMUNITY): Payer: Self-pay

## 2011-11-04 ENCOUNTER — Encounter (HOSPITAL_COMMUNITY): Payer: Self-pay | Admitting: Cardiology

## 2011-11-04 ENCOUNTER — Ambulatory Visit (HOSPITAL_COMMUNITY)
Admission: RE | Admit: 2011-11-04 | Discharge: 2011-11-04 | Disposition: A | Discharge status: Home / Self Care | Payer: Medicare Other | Source: Ambulatory Visit | Attending: Cardiology | Admitting: Cardiology

## 2011-11-04 DIAGNOSIS — R079 Chest pain, unspecified: Secondary | ICD-10-CM

## 2011-11-04 DIAGNOSIS — J8410 Pulmonary fibrosis, unspecified: Secondary | ICD-10-CM | POA: Insufficient documentation

## 2011-11-04 HISTORY — DX: Unspecified asthma, uncomplicated: J45.909

## 2011-11-04 MED ORDER — TECHNETIUM TC 99M SESTAMIBI - CARDIOLITE
30.0000 | Freq: Once | INTRAVENOUS | Status: AC | PRN
  Administered 2011-11-04: 13:00:00 30 via INTRAVENOUS

## 2011-11-04 MED ORDER — REGADENOSON 0.4 MG/5ML IV SOLN
INTRAVENOUS | Status: AC
  Administered 2011-11-04: 0.4 mg via INTRAVENOUS
  Filled 2011-11-04: qty 5

## 2011-11-04 MED ORDER — SODIUM CHLORIDE 0.9 % IJ SOLN
INTRAMUSCULAR | Status: AC
  Administered 2011-11-04: 10 mL via INTRAVENOUS
  Filled 2011-11-04: qty 10

## 2011-11-04 MED ORDER — TECHNETIUM TC 99M SESTAMIBI - CARDIOLITE
10.0000 | Freq: Once | INTRAVENOUS | Status: AC | PRN
  Administered 2011-11-04: 10 via INTRAVENOUS

## 2011-11-04 NOTE — Progress Notes (Signed)
Stress Lab Nurses Notes - [facility] 11/04/2011 Reason for doing test: Chest Pain Type of test: Marlane Hatcher Nurse performing test: Parke Poisson, RN Nuclear Medicine Tech: Lyndel Pleasure Echo Tech: Not Applicable MD performing test: R. Dietrich Pates Family MD: Lilyan Punt Test explained and consent signed: yes IV started: 22g jelco, Saline lock flushed, No redness or edema and Saline lock started in radiology Symptoms: Chest pressure & Flushed Treatment/Intervention: None Reason test stopped: protocol completed After recovery IV was: Discontinued via X-ray tech and No redness or edema Patient to return to Nuc. Med at :13:45 Patient discharged: Home Patient's Condition upon discharge was: stable Comments: During test BP 130/68 & HR 93.  Recovery BP 131/75 &  HR 78.  Symptoms resolved in recovery Erskine Speed T

## 2011-11-07 ENCOUNTER — Encounter: Payer: Self-pay | Admitting: Cardiology

## 2011-11-07 DIAGNOSIS — R079 Chest pain, unspecified: Secondary | ICD-10-CM | POA: Insufficient documentation

## 2011-11-13 ENCOUNTER — Ambulatory Visit: Payer: Medicare Other | Admitting: Physician Assistant

## 2012-02-11 ENCOUNTER — Encounter: Payer: Self-pay | Admitting: Physician Assistant

## 2012-04-03 ENCOUNTER — Encounter (HOSPITAL_COMMUNITY): Payer: Self-pay | Admitting: *Deleted

## 2012-04-03 ENCOUNTER — Ambulatory Visit: Payer: Medicare Other | Admitting: Pulmonary Disease

## 2012-04-03 ENCOUNTER — Emergency Department (HOSPITAL_COMMUNITY): Payer: Medicare Other

## 2012-04-03 ENCOUNTER — Observation Stay (HOSPITAL_COMMUNITY)
Admission: EM | Admit: 2012-04-03 | Discharge: 2012-04-04 | DRG: 446 | Disposition: A | Discharge status: Home / Self Care | Payer: Medicare Other | Attending: General Surgery | Admitting: General Surgery

## 2012-04-03 DIAGNOSIS — K219 Gastro-esophageal reflux disease without esophagitis: Secondary | ICD-10-CM | POA: Diagnosis present

## 2012-04-03 DIAGNOSIS — K8020 Calculus of gallbladder without cholecystitis without obstruction: Principal | ICD-10-CM | POA: Diagnosis present

## 2012-04-03 DIAGNOSIS — J84112 Idiopathic pulmonary fibrosis: Secondary | ICD-10-CM | POA: Diagnosis present

## 2012-04-03 DIAGNOSIS — R109 Unspecified abdominal pain: Secondary | ICD-10-CM

## 2012-04-03 DIAGNOSIS — E785 Hyperlipidemia, unspecified: Secondary | ICD-10-CM | POA: Diagnosis present

## 2012-04-03 DIAGNOSIS — M129 Arthropathy, unspecified: Secondary | ICD-10-CM | POA: Diagnosis present

## 2012-04-03 DIAGNOSIS — Z79899 Other long term (current) drug therapy: Secondary | ICD-10-CM

## 2012-04-03 DIAGNOSIS — J45909 Unspecified asthma, uncomplicated: Secondary | ICD-10-CM | POA: Diagnosis present

## 2012-04-03 DIAGNOSIS — Z87891 Personal history of nicotine dependence: Secondary | ICD-10-CM

## 2012-04-03 DIAGNOSIS — I10 Essential (primary) hypertension: Secondary | ICD-10-CM | POA: Diagnosis present

## 2012-04-03 DIAGNOSIS — L408 Other psoriasis: Secondary | ICD-10-CM | POA: Diagnosis present

## 2012-04-03 LAB — COMPREHENSIVE METABOLIC PANEL
ALT: 37 U/L (ref 0–53)
AST: 47 U/L — ABNORMAL HIGH (ref 0–37)
Alkaline Phosphatase: 79 U/L (ref 39–117)
CO2: 29 mEq/L (ref 19–32)
Calcium: 9.2 mg/dL (ref 8.4–10.5)
GFR calc non Af Amer: 84 mL/min — ABNORMAL LOW (ref >90)
Potassium: 3.6 mEq/L (ref 3.5–5.1)
Sodium: 139 mEq/L (ref 135–145)
Total Protein: 7.6 g/dL (ref 6.0–8.3)

## 2012-04-03 LAB — CBC WITH DIFFERENTIAL/PLATELET
Eosinophils Relative: 3 % (ref 0–5)
HCT: 44.4 % (ref 39.0–52.0)
Lymphocytes Relative: 30 % (ref 12–46)
Lymphs Abs: 3.5 10*3/uL (ref 0.7–4.0)
MCV: 88.4 fL (ref 78.0–100.0)
Monocytes Absolute: 0.8 10*3/uL (ref 0.1–1.0)
Monocytes Relative: 7 % (ref 3–12)
RBC: 5.02 MIL/uL (ref 4.22–5.81)
WBC: 11.8 10*3/uL — ABNORMAL HIGH (ref 4.0–10.5)

## 2012-04-03 MED ORDER — IOHEXOL 300 MG/ML  SOLN: 50.0000 mL | Freq: Once | INTRAMUSCULAR | Status: AC | PRN

## 2012-04-03 MED ORDER — IOHEXOL 300 MG/ML  SOLN: 100.0000 mL | Freq: Once | INTRAMUSCULAR | Status: AC | PRN

## 2012-04-03 MED ORDER — HYDROMORPHONE HCL PF 1 MG/ML IJ SOLN
2.0000 mg | Freq: Once | INTRAMUSCULAR | Status: AC
  Administered 2012-04-03: 2 mg via INTRAVENOUS
  Filled 2012-04-03: qty 2

## 2012-04-03 MED ORDER — SODIUM CHLORIDE 0.9 % IV SOLN
1000.0000 mL | INTRAVENOUS | Status: DC
  Administered 2012-04-03: 1000 mL via INTRAVENOUS

## 2012-04-03 MED ORDER — ONDANSETRON HCL 4 MG/2ML IJ SOLN
4.0000 mg | Freq: Once | INTRAMUSCULAR | Status: AC
  Administered 2012-04-03: 4 mg via INTRAVENOUS
  Filled 2012-04-03: qty 2

## 2012-04-03 MED ORDER — SODIUM CHLORIDE 0.9 % IV SOLN: 1000.0000 mL | Freq: Once | INTRAVENOUS | Status: DC

## 2012-04-03 NOTE — ED Provider Notes (Signed)
History    This chart was scribed for No att. providers found by Toya Smothers, ED Scribe. The patient was seen in room APA16A/APA16A. Patient's care was started at 2052.  CSN: [id number]  Arrival date & time 04/03/12  2052   None     Chief Complaint  Patient presents with  . epi gastric pain     The history is provided by the patient. No language interpreter was used.    Curtis [name] is a 67 y.o. male who presents to the Emergency Department complaining of 5 hours of gradual onset, constant, progressive lower anterior epigastric and RUQ pain after eating. Per Pt, pain began as indigestion and gas, and is unlike any pain before. Typically healthy. Pt reports one episode of emesis.  Nausea has subsided. Despite taking OTC reflux tablets there has been no improvement. No fever, chills, cough, congestion, rhinorrhea, chest pain, SOB, or diarrhea. Pt denies use of tobacco, alcohol, and illicit drug use. Current medications include ASA, aleve, and Omeprazole.   Past Medical History  Diagnosis Date  . Hypertension   . Dyslipidemia     Statin intolerant  . Idiopathic pulmonary fibrosis   . Arthritis   . Chest pain     Emergency room October 24, 2011, no MI  //   Nuclear, October, 2013, adenosine, EF 61%, no scar or ischemia  . Psoriasis   . Statin intolerance   . GERD (gastroesophageal reflux disease)   . Asthma     Past Surgical History  Procedure Laterality Date  . Neck surgery      History reviewed. No pertinent family history.  History  Substance Use Topics  . Smoking status: Former Smoker -- 0.50 packs/day for 52 years    Types: Cigarettes    Quit date: 09/07/2004  . Smokeless tobacco: Never Used  . Alcohol Use: No     Comment: quit drinking 2004      Review of Systems  Gastrointestinal: Positive for vomiting and abdominal pain.    Allergies  Prednisone  Home Medications   Current Outpatient Rx  Name  Route  Sig  Dispense  Refill  . albuterol  (VENTOLIN HFA) 108 (90 BASE) MCG/ACT inhaler   Inhalation   Inhale 2 puffs into the lungs every 6 (six) hours as needed. For shortness of breath         . betamethasone valerate ointment (VALISONE) 0.1 %   Topical   Apply 1 application topically Once daily as needed. For psioriasis         . fexofenadine (ALLEGRA) 180 MG tablet   Oral   Take 180 mg by mouth daily as needed. For allergies         . Fluticasone-Salmeterol (ADVAIR) 250-50 MCG/DOSE AEPB   Inhalation   Inhale 1 puff into the lungs daily.          . Glucosamine-Chondroitin (GLUCOSAMINE CHONDR COMPLEX PO)   Oral   Take 1 tablet by mouth daily.         . hydrochlorothiazide 25 MG tablet   Oral   Take 25 mg by mouth daily.          Marland Kitchen ibuprofen (ADVIL,MOTRIN) 200 MG tablet   Oral   Take 600 mg by mouth every 6 (six) hours as needed. For pain/relief         . naproxen sodium (ALEVE) 220 MG tablet   Oral   Take 440 mg by mouth daily as needed. For pain/relief         .  omeprazole (PRILOSEC) 20 MG capsule   Oral   Take 20 mg by mouth daily as needed. For acid reflux         . zolpidem (AMBIEN) 10 MG tablet   Oral   Take 5-10 mg by mouth At bedtime as needed. For sleep           BP 162/104  Pulse 85  Temp(Src) 98.2 F (36.8 C) (Oral)  Resp 30  SpO2 97%  Physical Exam  Nursing note and vitals reviewed. Constitutional: He is oriented to person, place, and time. He appears well-developed and well-nourished. No distress.  HENT:  Head: Normocephalic and atraumatic.  Eyes: Conjunctivae and EOM are normal.  Neck: Normal range of motion. Neck supple.  No meningeal signs  Cardiovascular: Normal rate, regular rhythm and normal heart sounds.   No murmur heard. Pulmonary/Chest: Effort normal and breath sounds normal. No respiratory distress.  Abdominal: Soft. Bowel sounds are normal. He exhibits no mass. There is no guarding.  Epigastric tenderness.  Musculoskeletal: Normal range of motion. He  exhibits no edema and no tenderness.  Neurological: He is alert and oriented to person, place, and time. No cranial nerve deficit.  Skin: Skin is warm and dry. He is not diaphoretic. No erythema.    ED Course  Procedures DIAGNOSTIC STUDIES: Oxygen Saturation is 97% on room air, normal by my interpretation.    COORDINATION OF CARE: 21:58- Evaluated Pt. Pt is awake, alert, and without distress. 22:03- Patient understand and agree with initial ED impression and plan with expectations set for ED visit. 22:14- Ordered CT Abdomen Pelvis W Contrast 1 time imaging.    10:17 PM  Date: 04/03/2012  Rate: 74  Rhythm: normal sinus rhythm and premature atrial contractions (PAC)  QRS Axis: normal  Intervals: normal  ST/T Wave abnormalities: normal  Conduction Disutrbances:none  Narrative Interpretation: Normal EKG  Old EKG Reviewed: unchanged   11:32 PM Results for orders placed during the hospital encounter of 04/03/12  COMPREHENSIVE METABOLIC PANEL      Result Value Range   Sodium 139  135 - 145 mEq/L   Potassium 3.6  3.5 - 5.1 mEq/L   Chloride 100  96 - 112 mEq/L   CO2 29  19 - 32 mEq/L   Glucose, Bld 90  70 - 99 mg/dL   BUN 8  6 - 23 mg/dL   Creatinine, Ser 1.61  0.50 - 1.35 mg/dL   Calcium 9.2  8.4 - 09.6 mg/dL   Total Protein 7.6  6.0 - 8.3 g/dL   Albumin 3.6  3.5 - 5.2 g/dL   AST 47 (*) 0 - 37 U/L   ALT 37  0 - 53 U/L   Alkaline Phosphatase 79  39 - 117 U/L   Total Bilirubin 1.1  0.3 - 1.2 mg/dL   GFR calc non Af Amer 84 (*) >90 mL/min   GFR calc Af Amer >90  >90 mL/min  LIPASE, BLOOD      Result Value Range   Lipase 59  11 - 59 U/L  CBC WITH DIFFERENTIAL      Result Value Range   WBC 11.8 (*) 4.0 - 10.5 K/uL   RBC 5.02  4.22 - 5.81 MIL/uL   Hemoglobin 15.8  13.0 - 17.0 g/dL   HCT 04.5  40.9 - 81.1 %   MCV 88.4  78.0 - 100.0 fL   MCH 31.5  26.0 - 34.0 pg   MCHC 35.6  30.0 - 36.0 g/dL  RDW 12.8  11.5 - 15.5 %   Platelets 139 (*) 150 - 400 K/uL   Neutrophils  Relative 61  43 - 77 %   Neutro Abs 7.2  1.7 - 7.7 K/uL   Lymphocytes Relative 30  12 - 46 %   Lymphs Abs 3.5  0.7 - 4.0 K/uL   Monocytes Relative 7  3 - 12 %   Monocytes Absolute 0.8  0.1 - 1.0 K/uL   Eosinophils Relative 3  0 - 5 %   Eosinophils Absolute 0.4  0.0 - 0.7 K/uL   Basophils Relative 0  0 - 1 %   Basophils Absolute 0.0  0.0 - 0.1 K/uL   Lab tests show mildly elevated WBC of 11,800, with normal differential.  Chemistries including lipase are essentially normal.  He is doing better after IV pain medicines, and is drinking oral contrast prior to getting abdominal and pelvic CT x-rays.  Signed out to Dr. Colon Branch   CT of abdomen/pelvis showed distended gallbladder, presumably from a gallstone at the gall bladder neck.  Dr. Colon Branch contacted Dr. Franky Macho, general surgeon, who admitted pt.  IMP:  Acute cholecystitis    I personally performed the services described in this documentation, which was scribed in my presence. The recorded information has been reviewed and is accurate.  Osvaldo Human, MD         Carleene Cooper III, MD 04/04/12 [phone]

## 2012-04-03 NOTE — ED Notes (Signed)
Pain rt lower ant chest and RUQ for 5 hours,  Nausea, vomited 1 time.

## 2012-04-03 NOTE — ED Notes (Signed)
Pt reports abdominal pain and cramping today. Pt reports taking laxative today secondary to decreased BM x 3 days after having " the GI virus last week". Pt c/o upper right quad pain and mid abdominal cramping. Denies chest pain.

## 2012-04-03 NOTE — ED Notes (Signed)
Pt in CT, will assess pt when he returns

## 2012-04-04 ENCOUNTER — Encounter (HOSPITAL_COMMUNITY): Payer: Self-pay | Admitting: General Practice

## 2012-04-04 LAB — URINALYSIS, ROUTINE W REFLEX MICROSCOPIC
Bilirubin Urine: NEGATIVE
Glucose, UA: NEGATIVE mg/dL
Hgb urine dipstick: NEGATIVE
Specific Gravity, Urine: <1.005 — ABNORMAL LOW (ref 1.005–1.030)

## 2012-04-04 MED ORDER — HYDROCODONE-ACETAMINOPHEN 5-325 MG PO TABS: 1.0000 | ORAL_TABLET | Freq: Four times a day (QID) | ORAL | Status: DC | PRN

## 2012-04-04 MED ORDER — ONDANSETRON HCL 4 MG/2ML IJ SOLN
4.0000 mg | Freq: Four times a day (QID) | INTRAMUSCULAR | Status: DC | PRN
  Filled 2012-04-04: qty 2

## 2012-04-04 MED ORDER — PANTOPRAZOLE SODIUM 40 MG IV SOLR
40.0000 mg | Freq: Once | INTRAVENOUS | Status: AC
  Administered 2012-04-04: 40 mg via INTRAVENOUS
  Filled 2012-04-04: qty 40

## 2012-04-04 MED ORDER — IOHEXOL 300 MG/ML  SOLN
100.0000 mL | Freq: Once | INTRAMUSCULAR | Status: AC | PRN
  Administered 2012-04-04: 100 mL via INTRAVENOUS

## 2012-04-04 MED ORDER — SODIUM CHLORIDE 0.9 % IV SOLN
INTRAVENOUS | Status: DC
  Administered 2012-04-04: 04:00:00 via INTRAVENOUS

## 2012-04-04 MED ORDER — IOHEXOL 300 MG/ML  SOLN
50.0000 mL | Freq: Once | INTRAMUSCULAR | Status: AC | PRN
  Administered 2012-04-04: 50 mL via ORAL

## 2012-04-04 MED ORDER — HYDROMORPHONE HCL PF 1 MG/ML IJ SOLN: 1.0000 mg | INTRAMUSCULAR | Status: DC | PRN

## 2012-04-04 NOTE — Progress Notes (Signed)
Patient states understanding of discharge instructions, prescriptions given 

## 2012-04-04 NOTE — ED Provider Notes (Signed)
2335 Assumed care/disposition of patient who is here with abdominal pain x 5 hours accompanied by an episode of vomiting. Pain is on the RUQ. Awaiting CT. 1258 CT has returned. Reviewed results with patient and his daugther. Ct Abdomen Pelvis W Contrast  04/04/2012  *RADIOLOGY REPORT*  Clinical Data: Right lower chest and right upper quadrant pain for 5 hours, nausea, vomiting, history hypertension, asthma, GERD  CT ABDOMEN AND PELVIS WITH CONTRAST  Technique:  Multidetector CT imaging of the abdomen and pelvis was performed following the standard protocol during bolus administration of intravenous contrast. Sagittal and coronal MPR images reconstructed from axial data set.  Contrast: 50mL OMNIPAQUE IOHEXOL 300 MG/ML  SOLN, OMNIPAQUE IOHEXOL 300 MG/ML  SOLN  Comparison: None  Findings: Peripheral bibasilar pulmonary fibrosis. Nodular hepatic contours suggesting cirrhosis. Distended gallbladder with density at gallbladder neck which could represent a noncalcified gallstone 9 mm diameter. No definite gallbladder wall thickening or pericholecystic infiltrative changes by CT. Spleen 13 mm diameter. No additional abnormalities of liver, spleen, pancreas, kidneys, or adrenal glands.  Descending and sigmoid colonic diverticulosis without the evidence of diverticulitis. Stomach and bowel loops otherwise unremarkable. Normal appendix. No mass, adenopathy, free fluid or inflammatory process. Unremarkable bladder and ureters. Small umbilical hernia containing fat. No acute osseous findings.  IMPRESSION: Cirrhotic appearing liver. Distended gallbladder with 9 mm density at gallbladder neck question cholelithiasis. Distal colonic diverticulosis without evidence of diverticulitis.   Original Report Authenticated By: Ulyses Southward, M.D.   1:01 AM:  T/C to Dr. Lovell Sheehan, general surgeon, case discussed, including:  HPI, pertinent PM/SHx, VS/PE, dx testing, ED course and treatment.  Agreeable to admission.  Requests to write  temporary orders,med-surg bed.  Pt stable in ED with no significant deterioration in condition.The patient appears reasonably stabilized for admission considering the current resources, flow, and capabilities available in the ED at this time, and I doubt any other [facility] requiring further screening and/or treatment in the ED prior to admission.  Coding   Nicoletta Dress. Colon Branch, MD 04/04/12 7829

## 2012-04-04 NOTE — ED Notes (Signed)
Pt denies any pain at this time, states no chest pain or abd pain, informed pt we are waiting for rsults of CT scan.

## 2012-04-04 NOTE — Discharge Summary (Signed)
Physician Discharge Summary  Patient ID: Curtis [name] MRN: [id number] DOB/AGE: 1945-06-27 67 y.o.  Admit date: 04/03/2012 Discharge date: 04/04/2012  Admission Diagnoses: Right upper quadrant abdominal pain, biliary colic, cholelithiasis, idiopathic chronic pulmonary fibrosis  Discharge Diagnoses: Same Active Problems:   * No active hospital problems. *   Discharged Condition: good  Hospital Course: Patient is a 67 year old white male who was in his usual state of health when he began experiencing right upper quadrant abdominal pain, nausea, vomiting. He presented emergency room and was found in CT scan the abdomen to have biliary colic secondary to cholelithiasis. He did have some gallbladder wall thickening. Was also noted to have some nodular cirrhotic changes in the liver. His liver tests were within normal limits. His white blood cell count was only slightly elevated. He was admitted to the hospital for control of his nausea and pain. His pain and nausea have resolved. He would like to be discharged home and scheduled for laparoscopic cholecystectomy as an outpatient. He is being discharged home in good improving condition.  Discharge Exam: Blood pressure 123/75, pulse 66, temperature 97.5 F (36.4 C), temperature source Oral, resp. rate 20, height 6' 1.5" (1.867 m), weight 113.535 kg (250 lb 4.8 oz), SpO2 95.00%. General appearance: alert, cooperative and no distress Eyes: No scleral icterus Resp: clear to auscultation bilaterally and Minimal expiratory wheezing noted. Cardio: regular rate and rhythm, S1, S2 normal, no murmur, click, rub or gallop GI: soft, non-tender; bowel sounds normal; no masses,  no organomegaly  Disposition: 01-Home or Self Care   Future Appointments Provider Department Dept Phone   05/08/2012 3:00 PM [facility] [phone]   05/08/2012 4:00 PM Barbaraann Share, MD [facility] [phone]       Medication  List    TAKE these medications       ALEVE 220 MG tablet  Generic drug:  naproxen sodium  Take 440 mg by mouth daily as needed. For pain/relief     betamethasone valerate ointment 0.1 %  Commonly known as:  VALISONE  Apply 1 application topically Once daily as needed. For psioriasis     fexofenadine 180 MG tablet  Commonly known as:  ALLEGRA  Take 180 mg by mouth daily as needed. For allergies     Fluticasone-Salmeterol 250-50 MCG/DOSE Aepb  Commonly known as:  ADVAIR  Inhale 1 puff into the lungs daily.     GLUCOSAMINE CHONDR COMPLEX PO  Take 1 tablet by mouth daily.     hydrochlorothiazide 25 MG tablet  Commonly known as:  HYDRODIURIL  Take 25 mg by mouth daily.     HYDROcodone-acetaminophen 5-325 MG per tablet  Commonly known as:  NORCO  Take 1-2 tablets by mouth every 6 (six) hours as needed for pain.     ibuprofen 200 MG tablet  Commonly known as:  ADVIL,MOTRIN  Take 600 mg by mouth every 6 (six) hours as needed. For pain/relief     omeprazole 20 MG capsule  Commonly known as:  PRILOSEC  Take 20 mg by mouth daily as needed. For acid reflux     VENTOLIN HFA 108 (90 BASE) MCG/ACT inhaler  Generic drug:  albuterol  Inhale 2 puffs into the lungs every 6 (six) hours as needed. For shortness of breath     zolpidem 10 MG tablet  Commonly known as:  AMBIEN  Take 5-10 mg by mouth At bedtime as needed. For sleep  Follow-up Information   Follow up with Dalia Heading, MD. (Call office to schedule surgery)    Contact information:   [address] [place] [address] [phone]       Signed: Franky Macho A 04/04/2012, 10:19 AM

## 2012-04-04 NOTE — H&P (Signed)
Curtis [name] is an 67 y.o. male.   Chief Complaint: Right upper quadrant abdominal pain HPI: Patient is a 67 year old white male who presented emergency room with a less than 24-hour history of worsening right upper quadrant abdominal pain, nausea, vomiting. CT scan the abdomen revealed cholelithiasis with a normal common bile duct. Mild gallbladder wall thickening is noted. Evidence of possible nodular cirrhosis was also noted. Patient states that this is his first episode.  Past Medical History  Diagnosis Date  . Hypertension   . Dyslipidemia     Statin intolerant  . Idiopathic pulmonary fibrosis   . Arthritis   . Chest pain     Emergency room October 24, 2011, no MI  //   Nuclear, October, 2013, adenosine, EF 61%, no scar or ischemia  . Psoriasis   . Statin intolerance   . GERD (gastroesophageal reflux disease)   . Asthma     Past Surgical History  Procedure Laterality Date  . Neck surgery      History reviewed. No pertinent family history. Social History:  reports that he quit smoking about 7 years ago. His smoking use included Cigarettes. He has a 26 pack-year smoking history. He has never used smokeless tobacco. He reports that he does not drink alcohol or use illicit drugs.  Allergies:  Allergies  Allergen Reactions  . Prednisone Anxiety    Irritation    Medications Prior to Admission  Medication Sig Dispense Refill  . albuterol (VENTOLIN HFA) 108 (90 BASE) MCG/ACT inhaler Inhale 2 puffs into the lungs every 6 (six) hours as needed. For shortness of breath      . betamethasone valerate ointment (VALISONE) 0.1 % Apply 1 application topically Once daily as needed. For psioriasis      . fexofenadine (ALLEGRA) 180 MG tablet Take 180 mg by mouth daily as needed. For allergies      . Fluticasone-Salmeterol (ADVAIR) 250-50 MCG/DOSE AEPB Inhale 1 puff into the lungs daily.       . Glucosamine-Chondroitin (GLUCOSAMINE CHONDR COMPLEX PO) Take 1 tablet by mouth daily.       . hydrochlorothiazide 25 MG tablet Take 25 mg by mouth daily.       Marland Kitchen ibuprofen (ADVIL,MOTRIN) 200 MG tablet Take 600 mg by mouth every 6 (six) hours as needed. For pain/relief      . naproxen sodium (ALEVE) 220 MG tablet Take 440 mg by mouth daily as needed. For pain/relief      . omeprazole (PRILOSEC) 20 MG capsule Take 20 mg by mouth daily as needed. For acid reflux      . zolpidem (AMBIEN) 10 MG tablet Take 5-10 mg by mouth At bedtime as needed. For sleep        Results for orders placed during the hospital encounter of 04/03/12 (from the past 48 hour(s))  COMPREHENSIVE METABOLIC PANEL     Status: Abnormal   Collection Time    04/03/12 10:25 PM      Result Value Range   Sodium 139  135 - 145 mEq/L   Potassium 3.6  3.5 - 5.1 mEq/L   Chloride 100  96 - 112 mEq/L   CO2 29  19 - 32 mEq/L   Glucose, Bld 90  70 - 99 mg/dL   BUN 8  6 - 23 mg/dL   Creatinine, Ser 1.91  0.50 - 1.35 mg/dL   Calcium 9.2  8.4 - 47.8 mg/dL   Total Protein 7.6  6.0 - 8.3 g/dL   Albumin 3.6  3.5 - 5.2 g/dL   AST 47 (*) 0 - 37 U/L   ALT 37  0 - 53 U/L   Alkaline Phosphatase 79  39 - 117 U/L   Total Bilirubin 1.1  0.3 - 1.2 mg/dL   GFR calc non Af Amer 84 (*) >90 mL/min   GFR calc Af Amer >90  >90 mL/min   Comment:            The eGFR has been calculated     using the CKD EPI equation.     This calculation has not been     validated in all clinical     situations.     eGFR's persistently     <90 mL/min signify     possible Chronic Kidney Disease.  LIPASE, BLOOD     Status: None   Collection Time    04/03/12 10:25 PM      Result Value Range   Lipase 59  11 - 59 U/L  CBC WITH DIFFERENTIAL     Status: Abnormal   Collection Time    04/03/12 10:25 PM      Result Value Range   WBC 11.8 (*) 4.0 - 10.5 K/uL   RBC 5.02  4.22 - 5.81 MIL/uL   Hemoglobin 15.8  13.0 - 17.0 g/dL   HCT 16.1  09.6 - 04.5 %   MCV 88.4  78.0 - 100.0 fL   MCH 31.5  26.0 - 34.0 pg   MCHC 35.6  30.0 - 36.0 g/dL   RDW 40.9  81.1  - 91.4 %   Platelets 139 (*) 150 - 400 K/uL   Neutrophils Relative 61  43 - 77 %   Neutro Abs 7.2  1.7 - 7.7 K/uL   Lymphocytes Relative 30  12 - 46 %   Lymphs Abs 3.5  0.7 - 4.0 K/uL   Monocytes Relative 7  3 - 12 %   Monocytes Absolute 0.8  0.1 - 1.0 K/uL   Eosinophils Relative 3  0 - 5 %   Eosinophils Absolute 0.4  0.0 - 0.7 K/uL   Basophils Relative 0  0 - 1 %   Basophils Absolute 0.0  0.0 - 0.1 K/uL  URINALYSIS, ROUTINE W REFLEX MICROSCOPIC     Status: Abnormal   Collection Time    04/04/12  5:05 AM      Result Value Range   Color, Urine YELLOW  YELLOW   APPearance CLEAR  CLEAR   Specific Gravity, Urine <1.005 (*) 1.005 - 1.030   pH 7.5  5.0 - 8.0   Glucose, UA NEGATIVE  NEGATIVE mg/dL   Hgb urine dipstick NEGATIVE  NEGATIVE   Bilirubin Urine NEGATIVE  NEGATIVE   Ketones, ur NEGATIVE  NEGATIVE mg/dL   Protein, ur NEGATIVE  NEGATIVE mg/dL   Urobilinogen, UA 4.0 (*) 0.0 - 1.0 mg/dL   Nitrite NEGATIVE  NEGATIVE   Leukocytes, UA NEGATIVE  NEGATIVE   Comment: MICROSCOPIC NOT DONE ON URINES WITH NEGATIVE PROTEIN, BLOOD, LEUKOCYTES, NITRITE, OR GLUCOSE <1000 mg/dL.   Ct Abdomen Pelvis W Contrast  04/04/2012  *RADIOLOGY REPORT*  Clinical Data: Right lower chest and right upper quadrant pain for 5 hours, nausea, vomiting, history hypertension, asthma, GERD  CT ABDOMEN AND PELVIS WITH CONTRAST  Technique:  Multidetector CT imaging of the abdomen and pelvis was performed following the standard protocol during bolus administration of intravenous contrast. Sagittal and coronal MPR images reconstructed from axial data set.  Contrast: 50mL OMNIPAQUE IOHEXOL 300  MG/ML  SOLN, OMNIPAQUE IOHEXOL 300 MG/ML  SOLN  Comparison: None  Findings: Peripheral bibasilar pulmonary fibrosis. Nodular hepatic contours suggesting cirrhosis. Distended gallbladder with density at gallbladder neck which could represent a noncalcified gallstone 9 mm diameter. No definite gallbladder wall thickening or  pericholecystic infiltrative changes by CT. Spleen 13 mm diameter. No additional abnormalities of liver, spleen, pancreas, kidneys, or adrenal glands.  Descending and sigmoid colonic diverticulosis without the evidence of diverticulitis. Stomach and bowel loops otherwise unremarkable. Normal appendix. No mass, adenopathy, free fluid or inflammatory process. Unremarkable bladder and ureters. Small umbilical hernia containing fat. No acute osseous findings.  IMPRESSION: Cirrhotic appearing liver. Distended gallbladder with 9 mm density at gallbladder neck question cholelithiasis. Distal colonic diverticulosis without evidence of diverticulitis.   Original Report Authenticated By: Ulyses Southward, M.D.     Review of Systems  Constitutional: Positive for malaise/fatigue.  HENT: Negative.   Eyes: Negative.   Respiratory: Positive for shortness of breath.   Cardiovascular: Negative.   Gastrointestinal: Positive for heartburn, nausea and abdominal pain. Negative for vomiting.  Genitourinary: Negative.   Musculoskeletal: Negative.   Skin: Negative.   Neurological: Negative.   Endo/Heme/Allergies: Negative.     Blood pressure 123/75, pulse 66, temperature 97.5 F (36.4 C), temperature source Oral, resp. rate 20, height 6' 1.5" (1.867 m), weight 113.535 kg (250 lb 4.8 oz), SpO2 95.00%. Physical Exam  Constitutional: He is oriented to person, place, and time. He appears well-developed and well-nourished. No distress.  HENT:  Head: Normocephalic and atraumatic.  Eyes: No scleral icterus.  Neck: Normal range of motion. Neck supple.  Cardiovascular: Normal rate, regular rhythm and normal heart sounds.   Respiratory: Effort normal. No respiratory distress.  Minimal expiratory wheezing noted.  GI: Soft. Bowel sounds are normal. He exhibits no mass. There is no tenderness. There is no rebound.  Minimal right upper quadrant abdominal pain. No rigidity noted.  Neurological: He is alert and oriented to person,  place, and time.  Skin: Skin is warm and dry.  Psychiatric: He has a normal mood and affect. His behavior is normal.     Assessment/Plan Impression: Biliary colic, cholelithiasis Plan: Patient be admitted to the hospital for control of his pain and nausea. IV fluids have been instituted. Patient will need laparoscopic cholecystectomy. We'll review the patient's history of idiopathic pulmonary fibrosis.  Anadelia Kintz A 04/04/2012, 10:10 AM

## 2012-04-07 NOTE — Progress Notes (Signed)
UR Chart Review Completed  

## 2012-04-14 ENCOUNTER — Encounter (INDEPENDENT_AMBULATORY_CARE_PROVIDER_SITE_OTHER): Payer: Self-pay | Admitting: Surgery

## 2012-04-14 ENCOUNTER — Ambulatory Visit (INDEPENDENT_AMBULATORY_CARE_PROVIDER_SITE_OTHER): Payer: Medicare Other | Admitting: Surgery

## 2012-04-14 VITALS — BP 142/94 | HR 76 | Temp 97.7°F | Resp 16 | Ht 73.25 in | Wt 244.8 lb

## 2012-04-14 DIAGNOSIS — K8020 Calculus of gallbladder without cholecystitis without obstruction: Secondary | ICD-10-CM | POA: Insufficient documentation

## 2012-04-14 NOTE — Progress Notes (Signed)
Patient ID: Curtis [name], male   DOB: Dec 17, 1945, 67 y.o.   MRN: [id number]  Chief Complaint  Patient presents with  . Cholelithiasis    new pt    HPI Curtis [name] is a 66 y.o. male.   HPI This pleasant gentleman was recently admitted to him he can hospital for symptomatic cholelithiasis.  He quickly improved and was discharged home. Because his wife had surgery done here for her gallbladder, he decided he wanted to come here. Currently he has mild abdominal pain. During his attack, he had right upper quadrant abdominal pain nausea and vomiting. At the time of his workup, a CAT scan did suggest cirrhosis. He has had no other symptoms of cirrhosis. He has not had alcohol in 10 years. He does have a history of pulmonary fibrosis but is currently not short of breath. Past Medical History  Diagnosis Date  . Hypertension   . Dyslipidemia     Statin intolerant  . Idiopathic pulmonary fibrosis   . Arthritis   . Chest pain     Emergency room October 24, 2011, no MI  //   Nuclear, October, 2013, adenosine, EF 61%, no scar or ischemia  . Psoriasis   . Statin intolerance   . GERD (gastroesophageal reflux disease)   . Asthma     Past Surgical History  Procedure Laterality Date  . Neck surgery      Family History  Problem Relation Age of Onset  . Cancer Sister     breast    Social History History  Substance Use Topics  . Smoking status: Former Smoker -- 0.50 packs/day for 52 years    Types: Cigarettes    Quit date: 09/07/2004  . Smokeless tobacco: Never Used  . Alcohol Use: No     Comment: quit drinking 2004    Allergies  Allergen Reactions  . Prednisone Anxiety    Irritation    Current Outpatient Prescriptions  Medication Sig Dispense Refill  . albuterol (VENTOLIN HFA) 108 (90 BASE) MCG/ACT inhaler Inhale 2 puffs into the lungs every 6 (six) hours as needed. For shortness of breath      . betamethasone valerate ointment (VALISONE) 0.1 % Apply 1 application  topically Once daily as needed. For psioriasis      . fexofenadine (ALLEGRA) 180 MG tablet Take 180 mg by mouth daily as needed. For allergies      . fish oil-omega-3 fatty acids 1000 MG capsule Take 2 g by mouth daily.      . Fluticasone-Salmeterol (ADVAIR) 250-50 MCG/DOSE AEPB Inhale 1 puff into the lungs daily.       . hydrochlorothiazide 25 MG tablet Take 25 mg by mouth daily.       Marland Kitchen HYDROcodone-acetaminophen (NORCO) 5-325 MG per tablet Take 1-2 tablets by mouth every 6 (six) hours as needed for pain.  50 tablet  0  . ibuprofen (ADVIL,MOTRIN) 200 MG tablet Take 600 mg by mouth every 6 (six) hours as needed. For pain/relief      . naproxen sodium (ALEVE) 220 MG tablet Take 440 mg by mouth daily as needed. For pain/relief      . omeprazole (PRILOSEC) 20 MG capsule Take 20 mg by mouth daily as needed. For acid reflux      . zolpidem (AMBIEN) 10 MG tablet Take 5-10 mg by mouth At bedtime as needed. For sleep       No current facility-administered medications for this visit.    Review of Systems Review of Systems  Constitutional: Negative for fever, chills and unexpected weight change.  HENT: Negative for hearing loss, congestion, sore throat, trouble swallowing and voice change.   Eyes: Negative for visual disturbance.  Respiratory: Positive for shortness of breath. Negative for cough and wheezing.   Cardiovascular: Negative for chest pain, palpitations and leg swelling.  Gastrointestinal: Positive for nausea, vomiting and abdominal pain. Negative for diarrhea, constipation, blood in stool, abdominal distention, anal bleeding and rectal pain.  Genitourinary: Negative for hematuria and difficulty urinating.  Musculoskeletal: Negative for arthralgias.  Skin: Negative for rash and wound.  Neurological: Negative for seizures, syncope, weakness and headaches.  Hematological: Negative for adenopathy. Does not bruise/bleed easily.  Psychiatric/Behavioral: Negative for confusion.    Blood  pressure 142/94, pulse 76, temperature 97.7 F (36.5 C), temperature source Oral, resp. rate 16, height 6' 1.25" (1.861 m), weight 244 lb 12.8 oz (111.041 kg).  Physical Exam Physical Exam  Constitutional: He is oriented to person, place, and time. He appears well-developed and well-nourished. No distress.  HENT:  Head: Normocephalic and atraumatic.  Right Ear: External ear normal.  Left Ear: External ear normal.  Nose: Nose normal.  Mouth/Throat: Oropharynx is clear and moist. No oropharyngeal exudate.  Eyes: Conjunctivae are normal. Pupils are equal, round, and reactive to light. Right eye exhibits no discharge. Left eye exhibits no discharge. No scleral icterus.  Neck: Normal range of motion. Neck supple. No tracheal deviation present. No thyromegaly present.  Cardiovascular: Normal rate, regular rhythm, normal heart sounds and intact distal pulses.   No murmur heard. Pulmonary/Chest: Breath sounds normal. No respiratory distress. He has no wheezes.  Abdominal: Soft. Bowel sounds are normal. He exhibits no distension. There is no tenderness. There is no rebound.  Musculoskeletal: Normal range of motion.  Lymphadenopathy:    He has no cervical adenopathy.  Neurological: He is alert and oriented to person, place, and time.  Skin: Skin is warm and dry. No rash noted. He is not diaphoretic. No erythema.  Psychiatric: His behavior is normal. Judgment normal.    Data Reviewed   Assessment    Symptomatic cholelithiasis     Plan    Laparoscopic cholecystectomy with possible  Cholangiogram is recommended. He also may need a liver biopsy at that time if cirrhosis is present. I discussed the risk of surgery which includes but is not limited to bleeding, infection, bile duct injury, bile leak, injury to other structures, Need for conversion to an open procedure, etc. He understands and wishes to proceed. I also discussed postoperative pulmonary complications and DVT. Surgery will be  scheduled        Leonor Darnell A 04/14/2012, 11:53 AM

## 2012-04-27 ENCOUNTER — Ambulatory Visit (INDEPENDENT_AMBULATORY_CARE_PROVIDER_SITE_OTHER): Payer: Self-pay | Admitting: General Surgery

## 2012-05-05 NOTE — Pre-Procedure Instructions (Signed)
Curtis [name]  05/05/2012   Your procedure is scheduled on:  Friday May 08, 2012  Report to [facility] at 0700 AM.  Call this number if you have problems the morning of surgery: [phone]   Remember:   Do not eat food or drink liquids after midnight.Thursday   Take these medicines the morning of surgery with A SIP OF WATER: Omeprazole (Prilosec), and Allegra if needed for allergies. Use and bring with you day of surgery Albuterol inhaler, and Advair . May have Hydrocodone-Acetaminophen if needed for pain.   Do not wear jewelry.  Do not wear lotions, or powders. You may wear deodorant.             Men may shave face and neck.  Do not bring valuables to the hospital.  Contacts, dentures or bridgework may not be worn into surgery.  Leave suitcase in the car. After surgery it may be brought to your room.  For patients admitted to the hospital, checkout time is 11:00 AM the day of  discharge.   Patients discharged the day of surgery will not be allowed to drive  home.  Name and phone number of your driver:   Special Instructions: Shower using CHG 2 nights before surgery and the night before surgery.  If you shower the day of surgery use CHG.  Use special wash - you have one bottle of CHG for all showers.  You should use approximately 1/3 of the bottle for each shower.   Please read over the following fact sheets that you were given: Pain Booklet, Coughing and Deep Breathing, MRSA Information and Surgical Site Infection Prevention

## 2012-05-06 ENCOUNTER — Encounter (HOSPITAL_COMMUNITY)
Admission: RE | Admit: 2012-05-06 | Discharge: 2012-05-06 | Disposition: A | Discharge status: Home / Self Care | Payer: Medicare Other | Source: Ambulatory Visit | Attending: Surgery | Admitting: Surgery

## 2012-05-06 ENCOUNTER — Encounter (HOSPITAL_COMMUNITY): Payer: Self-pay

## 2012-05-06 LAB — CBC WITH DIFFERENTIAL/PLATELET
Basophils Absolute: 0 10*3/uL (ref 0.0–0.1)
Basophils Relative: 0 % (ref 0–1)
Eosinophils Absolute: 0.6 10*3/uL (ref 0.0–0.7)
Eosinophils Relative: 6 % — ABNORMAL HIGH (ref 0–5)
HCT: 44.3 % (ref 39.0–52.0)
Lymphocytes Relative: 27 % (ref 12–46)
MCH: 31.9 pg (ref 26.0–34.0)
MCHC: 35.9 g/dL (ref 30.0–36.0)
MCV: 89 fL (ref 78.0–100.0)
Monocytes Absolute: 0.7 10*3/uL (ref 0.1–1.0)
RDW: 12.8 % (ref 11.5–15.5)

## 2012-05-06 LAB — COMPREHENSIVE METABOLIC PANEL
AST: 42 U/L — ABNORMAL HIGH (ref 0–37)
CO2: 25 mEq/L (ref 19–32)
Calcium: 9.3 mg/dL (ref 8.4–10.5)
Creatinine, Ser: 0.87 mg/dL (ref 0.50–1.35)
GFR calc non Af Amer: 87 mL/min — ABNORMAL LOW (ref >90)
Total Protein: 8 g/dL (ref 6.0–8.3)

## 2012-05-06 LAB — SURGICAL PCR SCREEN: MRSA, PCR: NEGATIVE

## 2012-05-06 LAB — PROTIME-INR: INR: 1.13 (ref 0.00–1.49)

## 2012-05-07 MED ORDER — CEFAZOLIN SODIUM-DEXTROSE 2-3 GM-% IV SOLR: 2.0000 g | INTRAVENOUS | Status: DC

## 2012-05-08 ENCOUNTER — Encounter (HOSPITAL_COMMUNITY): Payer: Self-pay | Admitting: Anesthesiology

## 2012-05-08 ENCOUNTER — Ambulatory Visit (HOSPITAL_COMMUNITY): Payer: Medicare Other | Admitting: Anesthesiology

## 2012-05-08 ENCOUNTER — Encounter (HOSPITAL_COMMUNITY)
Admission: RE | Disposition: A | Discharge status: Home / Self Care | Payer: Self-pay | Source: Ambulatory Visit | Attending: Surgery

## 2012-05-08 ENCOUNTER — Ambulatory Visit: Payer: Medicare Other | Admitting: Pulmonary Disease

## 2012-05-08 ENCOUNTER — Observation Stay (HOSPITAL_COMMUNITY)
Admission: RE | Admit: 2012-05-08 | Discharge: 2012-05-09 | Disposition: A | Discharge status: Home / Self Care | Payer: Medicare Other | Source: Ambulatory Visit | Attending: Surgery | Admitting: Surgery

## 2012-05-08 DIAGNOSIS — K8010 Calculus of gallbladder with chronic cholecystitis without obstruction: Secondary | ICD-10-CM

## 2012-05-08 DIAGNOSIS — K7460 Unspecified cirrhosis of liver: Secondary | ICD-10-CM | POA: Insufficient documentation

## 2012-05-08 DIAGNOSIS — R933 Abnormal findings on diagnostic imaging of other parts of digestive tract: Secondary | ICD-10-CM

## 2012-05-08 DIAGNOSIS — K8020 Calculus of gallbladder without cholecystitis without obstruction: Principal | ICD-10-CM | POA: Insufficient documentation

## 2012-05-08 HISTORY — PX: LIVER BIOPSY: SHX301

## 2012-05-08 HISTORY — PX: CHOLECYSTECTOMY: SHX55

## 2012-05-08 LAB — CBC
Hemoglobin: 15.4 g/dL (ref 13.0–17.0)
MCH: 31.4 pg (ref 26.0–34.0)
MCHC: 34.8 g/dL (ref 30.0–36.0)

## 2012-05-08 SURGERY — LAPAROSCOPIC CHOLECYSTECTOMY
Anesthesia: General | Site: Abdomen | Wound class: Contaminated

## 2012-05-08 MED ORDER — BUPIVACAINE-EPINEPHRINE 0.25% -1:200000 IJ SOLN
INTRAMUSCULAR | Status: DC | PRN
  Administered 2012-05-08: 20 mL

## 2012-05-08 MED ORDER — ONDANSETRON HCL 4 MG/2ML IJ SOLN
INTRAMUSCULAR | Status: DC | PRN
  Administered 2012-05-08: 4 mg via INTRAVENOUS

## 2012-05-08 MED ORDER — MORPHINE SULFATE 4 MG/ML IJ SOLN: 4.0000 mg | INTRAMUSCULAR | Status: DC | PRN

## 2012-05-08 MED ORDER — HYDROMORPHONE HCL PF 1 MG/ML IJ SOLN
1.0000 mg | INTRAMUSCULAR | Status: DC | PRN
  Administered 2012-05-08 – 2012-05-09 (×4): 1 mg via INTRAVENOUS
  Filled 2012-05-08 (×3): qty 1

## 2012-05-08 MED ORDER — ONDANSETRON HCL 4 MG PO TABS: 4.0000 mg | ORAL_TABLET | Freq: Four times a day (QID) | ORAL | Status: DC | PRN

## 2012-05-08 MED ORDER — ONDANSETRON HCL 4 MG/2ML IJ SOLN: 4.0000 mg | Freq: Once | INTRAMUSCULAR | Status: DC | PRN

## 2012-05-08 MED ORDER — MORPHINE SULFATE 2 MG/ML IJ SOLN
INTRAMUSCULAR | Status: AC
  Administered 2012-05-08: 2 mg via INTRAVENOUS
  Filled 2012-05-08: qty 1

## 2012-05-08 MED ORDER — SODIUM CHLORIDE 0.9 % IJ SOLN: 3.0000 mL | INTRAMUSCULAR | Status: DC | PRN

## 2012-05-08 MED ORDER — PROPOFOL 10 MG/ML IV BOLUS
INTRAVENOUS | Status: DC | PRN
  Administered 2012-05-08: 150 mg via INTRAVENOUS

## 2012-05-08 MED ORDER — LIDOCAINE HCL (CARDIAC) 20 MG/ML IV SOLN
INTRAVENOUS | Status: DC | PRN
  Administered 2012-05-08: 50 mg via INTRAVENOUS

## 2012-05-08 MED ORDER — SODIUM CHLORIDE 0.9 % IR SOLN
Status: DC | PRN
  Administered 2012-05-08: 1

## 2012-05-08 MED ORDER — CEFAZOLIN SODIUM-DEXTROSE 2-3 GM-% IV SOLR
INTRAVENOUS | Status: AC
  Administered 2012-05-08: 2 g via INTRAVENOUS
  Filled 2012-05-08: qty 50

## 2012-05-08 MED ORDER — FENTANYL CITRATE 0.05 MG/ML IJ SOLN
INTRAMUSCULAR | Status: DC | PRN
  Administered 2012-05-08: 50 ug via INTRAVENOUS
  Administered 2012-05-08: 100 ug via INTRAVENOUS
  Administered 2012-05-08: 50 ug via INTRAVENOUS

## 2012-05-08 MED ORDER — ONDANSETRON HCL 4 MG/2ML IJ SOLN: 4.0000 mg | Freq: Four times a day (QID) | INTRAMUSCULAR | Status: DC | PRN

## 2012-05-08 MED ORDER — ACETAMINOPHEN 325 MG PO TABS: 650.0000 mg | ORAL_TABLET | ORAL | Status: DC | PRN

## 2012-05-08 MED ORDER — HYDROMORPHONE HCL PF 1 MG/ML IJ SOLN
INTRAMUSCULAR | Status: AC
  Filled 2012-05-08: qty 1

## 2012-05-08 MED ORDER — OXYCODONE HCL 5 MG PO TABS
ORAL_TABLET | ORAL | Status: AC
  Filled 2012-05-08: qty 1

## 2012-05-08 MED ORDER — MIDAZOLAM HCL 5 MG/5ML IJ SOLN
INTRAMUSCULAR | Status: DC | PRN
  Administered 2012-05-08: 1 mg via INTRAVENOUS

## 2012-05-08 MED ORDER — LACTATED RINGERS IV SOLN
INTRAVENOUS | Status: DC | PRN
  Administered 2012-05-08 (×2): via INTRAVENOUS

## 2012-05-08 MED ORDER — OXYCODONE HCL 5 MG PO TABS
5.0000 mg | ORAL_TABLET | ORAL | Status: DC | PRN
  Administered 2012-05-08 (×2): 5 mg via ORAL

## 2012-05-08 MED ORDER — NEOSTIGMINE METHYLSULFATE 1 MG/ML IJ SOLN
INTRAMUSCULAR | Status: DC | PRN
  Administered 2012-05-08: 4 mg via INTRAVENOUS

## 2012-05-08 MED ORDER — LACTATED RINGERS IV SOLN
INTRAVENOUS | Status: DC
  Administered 2012-05-08: 08:00:00 via INTRAVENOUS

## 2012-05-08 MED ORDER — GLYCOPYRROLATE 0.2 MG/ML IJ SOLN
INTRAMUSCULAR | Status: DC | PRN
  Administered 2012-05-08: 0.6 mg via INTRAVENOUS

## 2012-05-08 MED ORDER — OXYCODONE-ACETAMINOPHEN 5-325 MG PO TABS
1.0000 | ORAL_TABLET | ORAL | Status: DC | PRN
  Administered 2012-05-09: 2 via ORAL
  Filled 2012-05-08: qty 2

## 2012-05-08 MED ORDER — ARTIFICIAL TEARS OP OINT
TOPICAL_OINTMENT | OPHTHALMIC | Status: DC | PRN
  Administered 2012-05-08: 1 via OPHTHALMIC

## 2012-05-08 MED ORDER — ROCURONIUM BROMIDE 100 MG/10ML IV SOLN
INTRAVENOUS | Status: DC | PRN
Start: 2012-05-08 — End: 2012-05-08
  Administered 2012-05-08: 40 mg via INTRAVENOUS

## 2012-05-08 MED ORDER — OXYCODONE HCL 5 MG PO TABS: 5.0000 mg | ORAL_TABLET | Freq: Four times a day (QID) | ORAL | Status: DC | PRN

## 2012-05-08 MED ORDER — KETOROLAC TROMETHAMINE 30 MG/ML IJ SOLN
30.0000 mg | Freq: Three times a day (TID) | INTRAMUSCULAR | Status: AC
  Administered 2012-05-08 – 2012-05-09 (×3): 30 mg via INTRAVENOUS
  Filled 2012-05-08 (×3): qty 1

## 2012-05-08 MED ORDER — ACETAMINOPHEN 650 MG RE SUPP: 650.0000 mg | RECTAL | Status: DC | PRN

## 2012-05-08 MED ORDER — SODIUM CHLORIDE 0.9 % IJ SOLN: 3.0000 mL | Freq: Two times a day (BID) | INTRAMUSCULAR | Status: DC

## 2012-05-08 MED ORDER — BUPIVACAINE-EPINEPHRINE PF 0.25-1:200000 % IJ SOLN
INTRAMUSCULAR | Status: AC
  Filled 2012-05-08: qty 30

## 2012-05-08 MED ORDER — ACETAMINOPHEN 10 MG/ML IV SOLN
INTRAVENOUS | Status: AC
  Filled 2012-05-08: qty 100

## 2012-05-08 MED ORDER — SODIUM CHLORIDE 0.9 % IV SOLN: 250.0000 mL | INTRAVENOUS | Status: DC | PRN

## 2012-05-08 MED ORDER — POTASSIUM CHLORIDE IN NACL 20-0.9 MEQ/L-% IV SOLN
INTRAVENOUS | Status: DC
  Administered 2012-05-08 (×2): via INTRAVENOUS
  Filled 2012-05-08 (×4): qty 1000

## 2012-05-08 MED ORDER — MORPHINE SULFATE 2 MG/ML IJ SOLN
INTRAMUSCULAR | Status: AC
  Administered 2012-05-08: 2 mg via INTRAMUSCULAR
  Filled 2012-05-08: qty 1

## 2012-05-08 MED ORDER — ACETAMINOPHEN 10 MG/ML IV SOLN
1000.0000 mg | Freq: Once | INTRAVENOUS | Status: AC | PRN
  Administered 2012-05-08: 1000 mg via INTRAVENOUS

## 2012-05-08 MED ORDER — 0.9 % SODIUM CHLORIDE (POUR BTL) OPTIME
TOPICAL | Status: DC | PRN
  Administered 2012-05-08: 1000 mL

## 2012-05-08 SURGICAL SUPPLY — 41 items
APPLIER CLIP 5 13 M/L LIGAMAX5 (MISCELLANEOUS) ×3
BANDAGE ADHESIVE 1X3 (GAUZE/BANDAGES/DRESSINGS) ×12 IMPLANT
BENZOIN TINCTURE PRP APPL 2/3 (GAUZE/BANDAGES/DRESSINGS) ×3 IMPLANT
CANISTER SUCTION 2500CC (MISCELLANEOUS) ×3 IMPLANT
CHLORAPREP W/TINT 26ML (MISCELLANEOUS) ×3 IMPLANT
CLIP APPLIE 5 13 M/L LIGAMAX5 (MISCELLANEOUS) ×2 IMPLANT
CLOTH BEACON ORANGE TIMEOUT ST (SAFETY) ×3 IMPLANT
COVER MAYO STAND STRL (DRAPES) IMPLANT
COVER SURGICAL LIGHT HANDLE (MISCELLANEOUS) ×3 IMPLANT
DECANTER SPIKE VIAL GLASS SM (MISCELLANEOUS) IMPLANT
DRAPE C-ARM 42X72 X-RAY (DRAPES) IMPLANT
DRESSING TELFA 8X3 (GAUZE/BANDAGES/DRESSINGS) ×3 IMPLANT
ELECT REM PT RETURN 9FT ADLT (ELECTROSURGICAL) ×3
ELECTRODE REM PT RTRN 9FT ADLT (ELECTROSURGICAL) ×2 IMPLANT
GLOVE BIO SURGEON STRL SZ7.5 (GLOVE) ×3 IMPLANT
GLOVE BIOGEL PI IND STRL 7.5 (GLOVE) ×4 IMPLANT
GLOVE BIOGEL PI INDICATOR 7.5 (GLOVE) ×2
GLOVE SURG SIGNA 7.5 PF LTX (GLOVE) ×3 IMPLANT
GLOVE SURG SS PI 6.5 STRL IVOR (GLOVE) ×3 IMPLANT
GOWN PREVENTION PLUS XLARGE (GOWN DISPOSABLE) ×6 IMPLANT
GOWN STRL NON-REIN LRG LVL3 (GOWN DISPOSABLE) ×9 IMPLANT
KIT BASIN OR (CUSTOM PROCEDURE TRAY) ×3 IMPLANT
KIT ROOM TURNOVER OR (KITS) ×3 IMPLANT
NEEDLE BIOPSY 14X6 SOFT TISS (NEEDLE) ×3 IMPLANT
NS IRRIG 1000ML POUR BTL (IV SOLUTION) ×3 IMPLANT
PAD ARMBOARD 7.5X6 YLW CONV (MISCELLANEOUS) ×6 IMPLANT
POUCH SPECIMEN RETRIEVAL 10MM (ENDOMECHANICALS) ×3 IMPLANT
SCISSORS LAP 5X35 DISP (ENDOMECHANICALS) IMPLANT
SET CHOLANGIOGRAPH 5 50 .035 (SET/KITS/TRAYS/PACK) IMPLANT
SET IRRIG TUBING LAPAROSCOPIC (IRRIGATION / IRRIGATOR) ×3 IMPLANT
SLEEVE ENDOPATH XCEL 5M (ENDOMECHANICALS) ×6 IMPLANT
SPECIMEN JAR SMALL (MISCELLANEOUS) ×3 IMPLANT
STRIP CLOSURE SKIN 1/2X4 (GAUZE/BANDAGES/DRESSINGS) ×3 IMPLANT
SUT MNCRL AB 4-0 PS2 18 (SUTURE) ×6 IMPLANT
SUT MON AB 4-0 PC3 18 (SUTURE) ×3 IMPLANT
TOWEL OR 17X24 6PK STRL BLUE (TOWEL DISPOSABLE) ×3 IMPLANT
TOWEL OR 17X26 10 PK STRL BLUE (TOWEL DISPOSABLE) ×3 IMPLANT
TRAY LAPAROSCOPIC (CUSTOM PROCEDURE TRAY) ×3 IMPLANT
TROCAR XCEL BLUNT TIP 100MML (ENDOMECHANICALS) ×3 IMPLANT
TROCAR XCEL NON-BLD 5MMX100MML (ENDOMECHANICALS) ×3 IMPLANT
WATER STERILE IRR 1000ML POUR (IV SOLUTION) IMPLANT

## 2012-05-08 NOTE — Anesthesia Postprocedure Evaluation (Signed)
  Anesthesia Post-op Note  Patient: Curtis [name]  Procedure(s) Performed: Procedure(s): LAPAROSCOPIC CHOLECYSTECTOMY (N/A) LIVER BIOPSY (N/A)  Patient Location: PACU  Anesthesia Type:General  Level of Consciousness: awake, oriented, sedated and patient cooperative  Airway and Oxygen Therapy: Patient Spontanous Breathing  Post-op Pain: mild  Post-op Assessment: Post-op Vital signs reviewed, Patient's Cardiovascular Status Stable, Respiratory Function Stable, Patent Airway, No signs of Nausea or vomiting and Pain level controlled  Post-op Vital Signs: stable  Complications: No apparent anesthesia complications

## 2012-05-08 NOTE — Progress Notes (Signed)
DR [facility] NOTIFIED OF PAIN WAS 5/10 AND NOW IS 10/10 AND ABD FEELS FIRM; ORDER NOTED

## 2012-05-08 NOTE — Progress Notes (Signed)
O2 SAT 89-91 AND CLIENT STATES WEARS HOME O2 AND O2 STARTED AT 2L/MIN AND SAT 95

## 2012-05-08 NOTE — Interval H&P Note (Signed)
History and Physical Interval Note: no change  05/08/2012 8:50 AM  Curtis [name]  has presented today for surgery, with the diagnosis of gallstones, possible cirrhosis  The various methods of treatment have been discussed with the patient and family. After consideration of risks, benefits and other options for treatment, the patient has consented to  Procedure(s): LAPAROSCOPIC CHOLECYSTECTOMY (N/A) INTRAOPERATIVE CHOLANGIOGRAM (N/A) as a surgical intervention .  The patient's history has been reviewed, patient examined, no change in status, stable for surgery.  I have reviewed the patient's chart and labs.  Questions were answered to the patient's satisfaction.     Angella Montas A

## 2012-05-08 NOTE — Op Note (Signed)
LAPAROSCOPIC CHOLECYSTECTOMY, LIVER BIOPSY  Procedure Note  Curtis [name] 05/08/2012   Pre-op Diagnosis: gallstones, possible cirrhosis     Post-op Diagnosis: same  Procedure(s): LAPAROSCOPIC CHOLECYSTECTOMY LIVER BIOPSY  Surgeon(s): Shelly Rubenstein, MD  Anesthesia: General  Staff:  Circulator: Eppie Gibson, RN Relief Circulator: Doy Mince, RN Scrub Person: Janeece Agee Pingue, CST; Doy Mince, RN Circulator Assistant: Sudie Bailey, RN; Megan Day Cavanaugh, RN  Estimated Blood Loss: Minimal               Specimens: sent to path          [facility] A   Date: 05/08/2012  Time: 10:00 AM

## 2012-05-08 NOTE — Progress Notes (Signed)
REPORT CALLED TO RN FOR [phone] AND PAIN MED GIVEN AND CLIENT STATES PAIN DECREASED TO 7/10 AND TRANSFERRED TO 9U04 VIA BED

## 2012-05-08 NOTE — Anesthesia Procedure Notes (Signed)
Procedure Name: Intubation Date/Time: 05/08/2012 9:21 AM Performed by: Fransisca Kaufmann Pre-anesthesia Checklist: Patient identified, Emergency Drugs available, Suction available, Patient being monitored and Timeout performed Patient Re-evaluated:Patient Re-evaluated prior to inductionOxygen Delivery Method: Circle system utilized Preoxygenation: Pre-oxygenation with 100% oxygen Intubation Type: IV induction Ventilation: Mask ventilation without difficulty Laryngoscope Size: Miller and 3 Grade View: Grade I Tube type: Oral Tube size: 7.5 mm Number of attempts: 1 Airway Equipment and Method: Stylet Placement Confirmation: ETT inserted through vocal cords under direct vision and positive ETCO2 Secured at: 7.5 cm Tube secured with: Tape Dental Injury: Teeth and Oropharynx as per pre-operative assessment

## 2012-05-08 NOTE — Progress Notes (Signed)
CLIENT WAS C/O 5/10 ABD PAIN AND NOW MOANING WITH PAIN AND STATES IS 10/10 PAIN; ABD FIRM FEELING AND PAGED DR Magnus Ivan

## 2012-05-08 NOTE — Transfer of Care (Signed)
Immediate Anesthesia Transfer of Care Note  Patient: Curtis [name]  Procedure(s) Performed: Procedure(s): LAPAROSCOPIC CHOLECYSTECTOMY (N/A) LIVER BIOPSY (N/A)  Patient Location: PACU  Anesthesia Type:General  Level of Consciousness: awake, alert , oriented and sedated  Airway & Oxygen Therapy: Patient Spontanous Breathing and Patient connected to nasal cannula oxygen  Post-op Assessment: Report given to PACU RN, Post -op Vital signs reviewed and stable and Patient moving all extremities  Post vital signs: Reviewed and stable  Complications: No apparent anesthesia complications

## 2012-05-08 NOTE — Anesthesia Preprocedure Evaluation (Signed)
Anesthesia Evaluation  Patient identified by MRN, date of birth, ID band Patient awake    Reviewed: Allergy & Precautions, H&P , NPO status , Patient's Chart, lab work & pertinent test results  Airway Mallampati: II TM Distance: >3 FB     Dental  (+) Teeth Intact and Dental Advisory Given   Pulmonary  breath sounds clear to auscultation        Cardiovascular Rhythm:Regular Rate:Normal     Neuro/Psych    GI/Hepatic   Endo/Other    Renal/GU      Musculoskeletal   Abdominal   Peds  Hematology   Anesthesia Other Findings   Reproductive/Obstetrics                           Anesthesia Physical Anesthesia Plan  ASA: III  Anesthesia Plan: General   Post-op Pain Management:    Induction: Intravenous  Airway Management Planned: Oral ETT  Additional Equipment:   Intra-op Plan:   Post-operative Plan: Extubation in OR  Informed Consent: I have reviewed the patients History and Physical, chart, labs and discussed the procedure including the risks, benefits and alternatives for the proposed anesthesia with the patient or authorized representative who has indicated his/her understanding and acceptance.   Dental advisory given  Plan Discussed with: CRNA and Anesthesiologist  Anesthesia Plan Comments:         Anesthesia Quick Evaluation

## 2012-05-08 NOTE — Anesthesia Postprocedure Evaluation (Signed)
  Anesthesia Post-op Note  Patient: Curtis [name]  Procedure(s) Performed: Procedure(s): LAPAROSCOPIC CHOLECYSTECTOMY (N/A) LIVER BIOPSY (N/A)  Patient Location: PACU  Anesthesia Type:General  Level of Consciousness: awake, alert , oriented and patient cooperative  Airway and Oxygen Therapy: Patient Spontanous Breathing  Post-op Pain: mild  Post-op Assessment: Post-op Vital signs reviewed, Patient's Cardiovascular Status Stable, Respiratory Function Stable, Patent Airway, No signs of Nausea or vomiting and Pain level controlled  Post-op Vital Signs: stable  Complications: No apparent anesthesia complications

## 2012-05-08 NOTE — H&P (Signed)
Patient ID: Curtis [name], male DOB: [birth year], 67 y.o. MRN: [id number]  Chief Complaint   Patient presents with   .  Cholelithiasis     new pt   HPI  Curtis [name] is a 67 y.o. male.  HPI  This pleasant gentleman was recently admitted to [facility] for symptomatic cholelithiasis. He quickly improved and was discharged home. Because his wife had surgery done here for her gallbladder, he decided he wanted to come here. Currently he has mild abdominal pain. During his attack, he had right upper quadrant abdominal pain nausea and vomiting. At the time of his workup, a CAT scan did suggest cirrhosis. He has had no other symptoms of cirrhosis. He has not had alcohol in 10 years. He does have a history of pulmonary fibrosis but is currently not short of breath.  Past Medical History   Diagnosis  Date   .  Hypertension    .  Dyslipidemia      Statin intolerant   .  Idiopathic pulmonary fibrosis    .  Arthritis    .  Chest pain      Emergency room October 24, 2011, no MI // Nuclear, October, 2013, adenosine, EF 61%, no scar or ischemia   .  Psoriasis    .  Statin intolerance    .  GERD (gastroesophageal reflux disease)    .  Asthma     Past Surgical History   Procedure  Laterality  Date   .  Neck surgery      Family History   Problem  Relation  Age of Onset   .  Cancer  Sister      breast   Social History  History   Substance Use Topics   .  Smoking status:  Former Smoker -- 0.50 packs/day for 52 years     Types:  Cigarettes     Quit date:  09/07/2004   .  Smokeless tobacco:  Never Used   .  Alcohol Use:  No      Comment: quit drinking 2004    Allergies   Allergen  Reactions   .  Prednisone  Anxiety     Irritation    Current Outpatient Prescriptions   Medication  Sig  Dispense  Refill   .  albuterol (VENTOLIN HFA) 108 (90 BASE) MCG/ACT inhaler  Inhale 2 puffs into the lungs every 6 (six) hours as needed. For shortness of breath     .  betamethasone  valerate ointment (VALISONE) 0.1 %  Apply 1 application topically Once daily as needed. For psioriasis     .  fexofenadine (ALLEGRA) 180 MG tablet  Take 180 mg by mouth daily as needed. For allergies     .  fish oil-omega-3 fatty acids 1000 MG capsule  Take 2 g by mouth daily.     .  Fluticasone-Salmeterol (ADVAIR) 250-50 MCG/DOSE AEPB  Inhale 1 puff into the lungs daily.     .  hydrochlorothiazide 25 MG tablet  Take 25 mg by mouth daily.     Marland Kitchen  HYDROcodone-acetaminophen (NORCO) 5-325 MG per tablet  Take 1-2 tablets by mouth every 6 (six) hours as needed for pain.  50 tablet  0   .  ibuprofen (ADVIL,MOTRIN) 200 MG tablet  Take 600 mg by mouth every 6 (six) hours as needed. For pain/relief     .  naproxen sodium (ALEVE) 220 MG tablet  Take 440 mg by mouth daily as needed. For  pain/relief     .  omeprazole (PRILOSEC) 20 MG capsule  Take 20 mg by mouth daily as needed. For acid reflux     .  zolpidem (AMBIEN) 10 MG tablet  Take 5-10 mg by mouth At bedtime as needed. For sleep      No current facility-administered medications for this visit.   Review of Systems  Review of Systems  Constitutional: Negative for fever, chills and unexpected weight change.  HENT: Negative for hearing loss, congestion, sore throat, trouble swallowing and voice change.  Eyes: Negative for visual disturbance.  Respiratory: Positive for shortness of breath. Negative for cough and wheezing.  Cardiovascular: Negative for chest pain, palpitations and leg swelling.  Gastrointestinal: Positive for nausea, vomiting and abdominal pain. Negative for diarrhea, constipation, blood in stool, abdominal distention, anal bleeding and rectal pain.  Genitourinary: Negative for hematuria and difficulty urinating.  Musculoskeletal: Negative for arthralgias.  Skin: Negative for rash and wound.  Neurological: Negative for seizures, syncope, weakness and headaches.  Hematological: Negative for adenopathy. Does not bruise/bleed easily.   Psychiatric/Behavioral: Negative for confusion.  Blood pressure 142/94, pulse 76, temperature 97.7 F (36.5 C), temperature source Oral, resp. rate 16, height 6' 1.25" (1.861 m), weight 244 lb 12.8 oz (111.041 kg).  Physical Exam  Physical Exam  Constitutional: He is oriented to person, place, and time. He appears well-developed and well-nourished. No distress.  HENT:  Head: Normocephalic and atraumatic.  Right Ear: External ear normal.  Left Ear: External ear normal.  Nose: Nose normal.  Mouth/Throat: Oropharynx is clear and moist. No oropharyngeal exudate.  Eyes: Conjunctivae are normal. Pupils are equal, round, and reactive to light. Right eye exhibits no discharge. Left eye exhibits no discharge. No scleral icterus.  Neck: Normal range of motion. Neck supple. No tracheal deviation present. No thyromegaly present.  Cardiovascular: Normal rate, regular rhythm, normal heart sounds and intact distal pulses.  No murmur heard.  Pulmonary/Chest: Breath sounds normal. No respiratory distress. He has no wheezes.  Abdominal: Soft. Bowel sounds are normal. He exhibits no distension. There is no tenderness. There is no rebound.  Musculoskeletal: Normal range of motion.  Lymphadenopathy:  He has no cervical adenopathy.  Neurological: He is alert and oriented to person, place, and time.  Skin: Skin is warm and dry. No rash noted. He is not diaphoretic. No erythema.  Psychiatric: His behavior is normal. Judgment normal.  Data Reviewed  Assessment   Symptomatic cholelithiasis   Plan   Laparoscopic cholecystectomy with possible Cholangiogram is recommended. He also may need a liver biopsy at that time if cirrhosis is present. I discussed the risk of surgery which includes but is not limited to bleeding, infection, bile duct injury, bile leak, injury to other structures, Need for conversion to an open procedure, etc. He understands and wishes to proceed. I also discussed postoperative pulmonary  complications and DVT. Surgery will be scheduled   Dimitri Dsouza A

## 2012-05-09 ENCOUNTER — Telehealth (INDEPENDENT_AMBULATORY_CARE_PROVIDER_SITE_OTHER): Payer: Self-pay | Admitting: General Surgery

## 2012-05-09 LAB — CBC
HCT: 39.8 % (ref 39.0–52.0)
Hemoglobin: 13.7 g/dL (ref 13.0–17.0)
MCH: 31.8 pg (ref 26.0–34.0)
MCHC: 34.4 g/dL (ref 30.0–36.0)
MCV: 92.3 fL (ref 78.0–100.0)
Platelets: 88 10*3/uL — ABNORMAL LOW (ref 150–400)
RBC: 4.31 MIL/uL (ref 4.22–5.81)
RDW: 13.3 % (ref 11.5–15.5)
WBC: 17.9 10*3/uL — ABNORMAL HIGH (ref 4.0–10.5)

## 2012-05-09 LAB — COMPREHENSIVE METABOLIC PANEL
ALT: 36 U/L (ref 0–53)
Calcium: 8.7 mg/dL (ref 8.4–10.5)
GFR calc Af Amer: 86 mL/min — ABNORMAL LOW (ref >90)
Glucose, Bld: 93 mg/dL (ref 70–99)
Sodium: 140 mEq/L (ref 135–145)
Total Protein: 6.7 g/dL (ref 6.0–8.3)

## 2012-05-09 NOTE — Discharge Summary (Signed)
  Physician Discharge Summary  Patient ID: Curtis [name] MRN: [id number] DOB/AGE: 1945/08/11 67 y.o.  Admit date: 05/08/2012 Discharge date: 05/09/2012  Admitting Diagnosis: Symptomatic cholelithiasis   Discharge Diagnosis same  Consultants None  Procedures Laparoscopic Cholecystectomy with [facility]  Hospital Course 67 yr old male who presented to our office with symptomatic cholelithiasis.  Workup showed cholelithiasis.  Patient was admitted and underwent procedure listed above.  Tolerated procedure well and was transferred to the floor.  Diet was advanced as tolerated.  On POD#1, the patient was voiding well, tolerating diet, ambulating well, pain well controlled, vital signs stable, incisions c/d/i and felt stable for discharge home.  Patient will follow up in our office in 2 weeks and knows to call with questions or concerns.  Physical Exam VSS afebrile Heart: RRR Lungs: CTA bil Abd: Soft, mildly tender, incisions cdi    Medication List    TAKE these medications       ALEVE 220 MG tablet  Generic drug:  naproxen sodium  Take 440 mg by mouth daily as needed. For pain/relief     betamethasone valerate ointment 0.1 %  Commonly known as:  VALISONE  Apply 1 application topically Once daily as needed. For psioriasis     fexofenadine 180 MG tablet  Commonly known as:  ALLEGRA  Take 180 mg by mouth daily as needed. For allergies     fish oil-omega-3 fatty acids 1000 MG capsule  Take 2 g by mouth daily.     Fluticasone-Salmeterol 250-50 MCG/DOSE Aepb  Commonly known as:  ADVAIR  Inhale 1 puff into the lungs daily.     hydrochlorothiazide 25 MG tablet  Commonly known as:  HYDRODIURIL  Take 25 mg by mouth daily.     HYDROcodone-acetaminophen 5-325 MG per tablet  Commonly known as:  NORCO  Take 1-2 tablets by mouth every 6 (six) hours as needed for pain.     ibuprofen 200 MG tablet  Commonly known as:  ADVIL,MOTRIN  Take 600 mg by mouth every 6 (six) hours as  needed. For pain/relief     mupirocin ointment 2 %  Commonly known as:  BACTROBAN  Place 1 application into the nose 2 (two) times daily.     omeprazole 20 MG capsule  Commonly known as:  PRILOSEC  Take 20 mg by mouth daily as needed. For acid reflux     oxyCODONE 5 MG immediate release tablet  Commonly known as:  Oxy IR/ROXICODONE  Take 1-2 tablets (5-10 mg total) by mouth every 6 (six) hours as needed for pain.     VENTOLIN HFA 108 (90 BASE) MCG/ACT inhaler  Generic drug:  albuterol  Inhale 2 puffs into the lungs every 6 (six) hours as needed. For shortness of breath     zolpidem 10 MG tablet  Commonly known as:  AMBIEN  Take 5-10 mg by mouth At bedtime as needed. For sleep             Follow-up Information   Follow up with [facility] A, MD. Call in 2 weeks.   Contact information:   [address] [place] [address] [phone]       Signed: Denny Levy [facility] [phone]  05/09/2012, 9:43 AM

## 2012-05-09 NOTE — Discharge Summary (Signed)
Discussed with LW,PA. He has done well overnight, got out before I could see him however.

## 2012-05-09 NOTE — Op Note (Signed)
NAMEYONY, ROULSTON NO.:  [id number]  MEDICAL RECORD NO.:  [id number]  LOCATION:  6N29C                        FACILITY:  [facility]  PHYSICIAN:  Abigail Miyamoto, M.D. DATE OF BIRTH:  07-10-1945  DATE OF PROCEDURE:  05/08/2012 DATE OF DISCHARGE:                              OPERATIVE REPORT   POSTOPERATIVE DIAGNOSES:  Symptomatic cholelithiasis, possible cirrhosis.  POSTOPERATIVE DIAGNOSES:  Symptomatic cholelithiasis, cirrhosis.  PROCEDURES: 1. Laparoscopic cholecystectomy. 2. Tru-Cut liver biopsy.  SURGEON:  Abigail Miyamoto, M.D.  ANESTHESIA:  General endotracheal anesthesia.  ESTIMATED BLOOD LOSS:  Minimal.  INDICATIONS:  This is an 67 year old gentleman who presents with symptomatic cholelithiasis.  He has had a previous CAT scan suggesting cirrhosis, his liver function tests have been normal.  Decision was made to proceed with laparoscopic cholecystectomy and possible liver biopsy.  FINDINGS:  The patient was found to have moderate cirrhosis of the liver.  A Tru-Cut biopsy was performed.  PROCEDURE IN DETAIL:  The patient was brought to the operating room, identified as Curtis [name].  He was placed supine on the operating room table and general anesthesia was induced.  His abdomen was then prepped and draped in usual sterile fashion.  I made a small incision just above the umbilicus.  I took this down to the fascia, which was opened with the scalpel.  A hemostat was then used to pass into the peritoneal cavity under direct vision.  A 0 Vicryl pursestring suture was then placed around the fascial opening.  The Hasson port was placed through the opening and insufflation of the abdomen was begun.  A 5-mm port was then placed to the patient's epigastrium and two more in the right upper quadrant all under direct vision.  The patient's liver was cirrhotic in appearance, but was soft.  The gallbladder was mildly inflamed.  I was able to grasp and  easily retract it above the liver bed.  Dissection at the base of the gallbladder was then carried out. The patient's cystic artery was slightly anterior and I clipped it 3 times proximally, once distally and transected.  The cystic duct was then easily identified and critical window was achieved around it.  It was clipped 3 times proximally, once distally, and transected as well. The gallbladder was then easily dissected off the liver bed with the electrocautery.  Once it was free from the liver bed, I placed an endosac and removed it through the incision at the umbilicus.  I evaluated the liver bed and again hemostasis had already appeared to be easily achieved.  I made a separate incision at the patient's right upper quadrant and then placed a Tru-Cut needle biopsy through this and did a single biopsy of the liver.  Adequate tissue appeared to be achieved.  I achieved hemostasis at the biopsy site with the electrocautery.  At this point, I thoroughly irrigated the abdomen with normal saline.  Again, hemostasis appeared to be achieved.  All ports were then removed under direct vision and the abdomen was deflated.  The 0 Vicryl with umbilicus was tied in place closing the fascial defect. All incisions were anesthetized with Marcaine and closed with 4-0 Monocryl subcuticular sutures.  Steri-Strips and Band-Aids were then applied.  The patient tolerated the procedure well. All of the counts were correct at the end of the procedure.  The patient was then extubated in the operating room and taken in a stable condition to the recovery room.     Abigail Miyamoto, M.D.     DB/MEDQ  D:  05/08/2012  T:  05/09/2012  Job:  [id number]

## 2012-05-09 NOTE — Telephone Encounter (Signed)
Called this evening due to low grade fevers and burning with urination.  Denied nausea or severe abd pain.  Instructed pt on deep breathing, drinking plenty of fluids and starting some scheduled tylenol with his oxycodone.  They will call back if his symptoms get worse.

## 2012-05-11 ENCOUNTER — Encounter (HOSPITAL_COMMUNITY): Payer: Self-pay | Admitting: Surgery

## 2012-05-13 ENCOUNTER — Other Ambulatory Visit (HOSPITAL_COMMUNITY): Payer: Self-pay | Admitting: Family Medicine

## 2012-05-14 NOTE — Telephone Encounter (Signed)
May have 4 refills 

## 2012-05-15 ENCOUNTER — Encounter: Payer: Self-pay | Admitting: *Deleted

## 2012-05-26 ENCOUNTER — Ambulatory Visit (INDEPENDENT_AMBULATORY_CARE_PROVIDER_SITE_OTHER): Payer: Medicare Other | Admitting: Surgery

## 2012-05-26 ENCOUNTER — Encounter (INDEPENDENT_AMBULATORY_CARE_PROVIDER_SITE_OTHER): Payer: Self-pay | Admitting: Surgery

## 2012-05-26 VITALS — BP 132/76 | HR 66 | Temp 98.1°F | Resp 14 | Ht 73.5 in | Wt 238.6 lb

## 2012-05-26 DIAGNOSIS — Z09 Encounter for follow-up examination after completed treatment for conditions other than malignant neoplasm: Secondary | ICD-10-CM

## 2012-05-26 NOTE — Progress Notes (Signed)
Subjective:     Patient ID: Curtis [name], male   DOB: [birth year], 67 y.o.   MRN: [id number]  HPI He is here for his first postop visit status post laparoscopic cholecystectomy and liver biopsy He is doing well and has no complaints Review of Systems     Objective:   Physical Exam His incisions are healing well   The final pathology showed chronic cholecystitis with gallstones. The liver biopsy showed mild chronic inflammation without obvious cirrhosis Assessment:     Patient stable postop     Plan:     He may resume normal activity. He will continue his care with his primary care physicians. I will see him back as needed.  I did renew his hydrocodone

## 2012-05-29 ENCOUNTER — Encounter (HOSPITAL_COMMUNITY): Payer: Self-pay | Admitting: Pharmacy Technician

## 2012-06-03 ENCOUNTER — Encounter (HOSPITAL_COMMUNITY): Payer: Self-pay

## 2012-06-03 ENCOUNTER — Encounter (HOSPITAL_COMMUNITY)
Admission: RE | Admit: 2012-06-03 | Discharge: 2012-06-03 | Disposition: A | Discharge status: Home / Self Care | Payer: Medicare Other | Source: Ambulatory Visit | Attending: Ophthalmology | Admitting: Ophthalmology

## 2012-06-03 HISTORY — DX: Unspecified glaucoma: H40.9

## 2012-06-03 NOTE — Patient Instructions (Addendum)
Your procedure is scheduled on: 06/11/2012  Report to [facility] at  1020  AM.  Call this number if you have problems the morning of surgery: [phone]   Do not eat food or drink liquids :After Midnight.      Take these medicines the morning of surgery with A SIP OF WATER: allegra,hydrochlorothiazide,prilosec, oxycodone. Take albuterol and advair before you come.   Do not wear jewelry, make-up or nail polish.  Do not wear lotions, powders, or perfumes.  Do not shave 48 hours prior to surgery.  Do not bring valuables to the hospital.  Contacts, dentures or bridgework may not be worn into surgery.  Leave suitcase in the car. After surgery it may be brought to your room.  For patients admitted to the hospital, checkout time is 11:00 AM the day of discharge.   Patients discharged the day of surgery will not be allowed to drive home.  :     Please read over the following fact sheets that you were given: Coughing and Deep Breathing, Surgical Site Infection Prevention, Anesthesia Post-op Instructions and Care and Recovery After Surgery    Cataract A cataract is a clouding of the lens of the eye. When a lens becomes cloudy, vision is reduced based on the degree and nature of the clouding. Many cataracts reduce vision to some degree. Some cataracts make people more near-sighted as they develop. Other cataracts increase glare. Cataracts that are ignored and become worse can sometimes look white. The white color can be seen through the pupil. CAUSES   Aging. However, cataracts may occur at any age, even in newborns.   Certain drugs.   Trauma to the eye.   Certain diseases such as diabetes.   Specific eye diseases such as chronic inflammation inside the eye or a sudden attack of a rare form of glaucoma.   Inherited or acquired medical problems.  SYMPTOMS   Gradual, progressive drop in vision in the affected eye.   Severe, rapid visual loss. This most often happens when trauma is the cause.   DIAGNOSIS  To detect a cataract, an eye doctor examines the lens. Cataracts are best diagnosed with an exam of the eyes with the pupils enlarged (dilated) by drops.  TREATMENT  For an early cataract, vision may improve by using different eyeglasses or stronger lighting. If that does not help your vision, surgery is the only effective treatment. A cataract needs to be surgically removed when vision loss interferes with your everyday activities, such as driving, reading, or watching TV. A cataract may also have to be removed if it prevents examination or treatment of another eye problem. Surgery removes the cloudy lens and usually replaces it with a substitute lens (intraocular lens, IOL).  At a time when both you and your doctor agree, the cataract will be surgically removed. If you have cataracts in both eyes, only one is usually removed at a time. This allows the operated eye to heal and be out of danger from any possible problems after surgery (such as infection or poor wound healing). In rare cases, a cataract may be doing damage to your eye. In these cases, your caregiver may advise surgical removal right away. The vast majority of people who have cataract surgery have better vision afterward. HOME CARE INSTRUCTIONS  If you are not planning surgery, you may be asked to do the following:  Use different eyeglasses.   Use stronger or brighter lighting.   Ask your eye doctor about reducing  your medicine dose or changing medicines if it is thought that a medicine caused your cataract. Changing medicines does not make the cataract go away on its own.   Become familiar with your surroundings. Poor vision can lead to injury. Avoid bumping into things on the affected side. You are at a higher risk for tripping or falling.   Exercise extreme care when driving or operating machinery.   Wear sunglasses if you are sensitive to bright light or experiencing problems with glare.  SEEK IMMEDIATE MEDICAL CARE  IF:   You have a worsening or sudden vision loss.   You notice redness, swelling, or increasing pain in the eye.   You have a fever.  Document Released: 12/24/2004 Document Revised: 12/13/2010 Document Reviewed: 08/17/2010 [facility] Patient Information 2012 [place].PATIENT INSTRUCTIONS POST-ANESTHESIA  IMMEDIATELY FOLLOWING SURGERY:  Do not drive or operate machinery for the first twenty four hours after surgery.  Do not make any important decisions for twenty four hours after surgery or while taking narcotic pain medications or sedatives.  If you develop intractable nausea and vomiting or a severe headache please notify your doctor immediately.  FOLLOW-UP:  Please make an appointment with your surgeon as instructed. You do not need to follow up with anesthesia unless specifically instructed to do so.  WOUND CARE INSTRUCTIONS (if applicable):  Keep a dry clean dressing on the anesthesia/puncture wound site if there is drainage.  Once the wound has quit draining you may leave it open to air.  Generally you should leave the bandage intact for twenty four hours unless there is drainage.  If the epidural site drains for more than 36-48 hours please call the anesthesia department.  QUESTIONS?:  Please feel free to call your physician or the hospital operator if you have any questions, and they will be happy to assist you.

## 2012-06-10 MED ORDER — PHENYLEPHRINE HCL 2.5 % OP SOLN
OPHTHALMIC | Status: AC
  Filled 2012-06-10: qty 2

## 2012-06-10 MED ORDER — TETRACAINE HCL 0.5 % OP SOLN
OPHTHALMIC | Status: AC
  Filled 2012-06-10: qty 2

## 2012-06-10 MED ORDER — NEOMYCIN-POLYMYXIN-DEXAMETH 3.5-10000-0.1 OP OINT
TOPICAL_OINTMENT | OPHTHALMIC | Status: AC
  Filled 2012-06-10: qty 3.5

## 2012-06-10 MED ORDER — LIDOCAINE HCL (PF) 1 % IJ SOLN
INTRAMUSCULAR | Status: AC
  Filled 2012-06-10: qty 2

## 2012-06-10 MED ORDER — CYCLOPENTOLATE-PHENYLEPHRINE 0.2-1 % OP SOLN
OPHTHALMIC | Status: AC
  Filled 2012-06-10: qty 2

## 2012-06-10 MED ORDER — LIDOCAINE HCL 3.5 % OP GEL
OPHTHALMIC | Status: AC
  Filled 2012-06-10: qty 5

## 2012-06-11 ENCOUNTER — Encounter (HOSPITAL_COMMUNITY): Payer: Self-pay | Admitting: Anesthesiology

## 2012-06-11 ENCOUNTER — Other Ambulatory Visit: Payer: Self-pay | Admitting: Ophthalmology

## 2012-06-11 ENCOUNTER — Ambulatory Visit (HOSPITAL_COMMUNITY): Payer: Medicare Other | Admitting: Anesthesiology

## 2012-06-11 ENCOUNTER — Encounter (HOSPITAL_COMMUNITY): Payer: Self-pay | Admitting: *Deleted

## 2012-06-11 ENCOUNTER — Ambulatory Visit (HOSPITAL_COMMUNITY)
Admission: RE | Admit: 2012-06-11 | Discharge: 2012-06-11 | Disposition: A | Discharge status: Home / Self Care | Payer: Medicare Other | Source: Ambulatory Visit | Attending: Ophthalmology | Admitting: Ophthalmology

## 2012-06-11 ENCOUNTER — Encounter (HOSPITAL_COMMUNITY)
Admission: RE | Disposition: A | Discharge status: Home / Self Care | Payer: Self-pay | Source: Ambulatory Visit | Attending: Ophthalmology

## 2012-06-11 DIAGNOSIS — H2589 Other age-related cataract: Secondary | ICD-10-CM | POA: Insufficient documentation

## 2012-06-11 DIAGNOSIS — Z79899 Other long term (current) drug therapy: Secondary | ICD-10-CM | POA: Insufficient documentation

## 2012-06-11 DIAGNOSIS — I10 Essential (primary) hypertension: Secondary | ICD-10-CM | POA: Insufficient documentation

## 2012-06-11 HISTORY — PX: CATARACT EXTRACTION W/PHACO: SHX586

## 2012-06-11 SURGERY — PHACOEMULSIFICATION, CATARACT, WITH IOL INSERTION
Anesthesia: Monitor Anesthesia Care | Site: Eye | Laterality: Right | Wound class: Clean

## 2012-06-11 MED ORDER — ACETAZOLAMIDE 250 MG PO TABS
250.0000 mg | ORAL_TABLET | Freq: Once | ORAL | Status: AC
  Administered 2012-06-11: 250 mg via ORAL

## 2012-06-11 MED ORDER — LACTATED RINGERS IV SOLN
INTRAVENOUS | Status: DC
  Administered 2012-06-11: 11:00:00 via INTRAVENOUS

## 2012-06-11 MED ORDER — LIDOCAINE HCL (PF) 1 % IJ SOLN
INTRAOCULAR | Status: DC | PRN
  Administered 2012-06-11: 11:00:00 via OPHTHALMIC

## 2012-06-11 MED ORDER — MIDAZOLAM HCL 2 MG/2ML IJ SOLN
INTRAMUSCULAR | Status: AC
  Filled 2012-06-11: qty 2

## 2012-06-11 MED ORDER — TETRACAINE HCL 0.5 % OP SOLN
1.0000 [drp] | OPHTHALMIC | Status: AC
  Administered 2012-06-11 (×3): 1 [drp] via OPHTHALMIC

## 2012-06-11 MED ORDER — LIDOCAINE HCL 3.5 % OP GEL
1.0000 "application " | Freq: Once | OPHTHALMIC | Status: AC
  Administered 2012-06-11: 1 via OPHTHALMIC

## 2012-06-11 MED ORDER — PHENYLEPHRINE HCL 2.5 % OP SOLN
1.0000 [drp] | OPHTHALMIC | Status: AC
  Administered 2012-06-11 (×3): 1 [drp] via OPHTHALMIC

## 2012-06-11 MED ORDER — POVIDONE-IODINE 5 % OP SOLN
OPHTHALMIC | Status: DC | PRN
  Administered 2012-06-11: 1 via OPHTHALMIC

## 2012-06-11 MED ORDER — CYCLOPENTOLATE-PHENYLEPHRINE 0.2-1 % OP SOLN
1.0000 [drp] | OPHTHALMIC | Status: AC
  Administered 2012-06-11 (×3): 1 [drp] via OPHTHALMIC

## 2012-06-11 MED ORDER — EPINEPHRINE HCL 1 MG/ML IJ SOLN
INTRAMUSCULAR | Status: AC
  Filled 2012-06-11: qty 1

## 2012-06-11 MED ORDER — EPINEPHRINE HCL 1 MG/ML IJ SOLN
INTRAOCULAR | Status: DC | PRN
  Administered 2012-06-11: 11:00:00

## 2012-06-11 MED ORDER — NEOMYCIN-POLYMYXIN-DEXAMETH 0.1 % OP OINT
TOPICAL_OINTMENT | OPHTHALMIC | Status: DC | PRN
  Administered 2012-06-11: 1 via OPHTHALMIC

## 2012-06-11 MED ORDER — ACETAZOLAMIDE ER 500 MG PO CP12: 500.0000 mg | ORAL_CAPSULE | Freq: Once | ORAL | Status: DC

## 2012-06-11 MED ORDER — LIDOCAINE HCL (PF) 1 % IJ SOLN: INTRAMUSCULAR | Status: DC | PRN

## 2012-06-11 MED ORDER — LIDOCAINE 3.5 % OP GEL OPTIME - NO CHARGE
OPHTHALMIC | Status: DC | PRN
  Administered 2012-06-11: 1 [drp] via OPHTHALMIC

## 2012-06-11 MED ORDER — BSS IO SOLN
INTRAOCULAR | Status: DC | PRN
  Administered 2012-06-11: 15 mL via INTRAOCULAR

## 2012-06-11 MED ORDER — ACETAZOLAMIDE 250 MG PO TABS
ORAL_TABLET | ORAL | Status: AC
  Filled 2012-06-11: qty 1

## 2012-06-11 MED ORDER — PROVISC 10 MG/ML IO SOLN
INTRAOCULAR | Status: DC | PRN
  Administered 2012-06-11: 8.5 mg via INTRAOCULAR

## 2012-06-11 MED ORDER — MIDAZOLAM HCL 2 MG/2ML IJ SOLN
1.0000 mg | INTRAMUSCULAR | Status: DC | PRN
  Administered 2012-06-11: 2 mg via INTRAVENOUS

## 2012-06-11 SURGICAL SUPPLY — 32 items

## 2012-06-11 NOTE — Op Note (Signed)
Date of Admission: 06/11/2012  Date of Surgery: 06/11/2012  Pre-Op Dx: Cataract  Right  Eye  Post-Op Dx: Combined Cataract  Right  Eye,  Dx Code 366.19  Surgeon: Gemma Payor, M.D.  Assistants: None  Anesthesia: Topical with MAC  Indications: Painless, progressive loss of vision with compromise of daily activities.  Surgery: Cataract Extraction with Intraocular lens Implant Right Eye  Discription: The patient had dilating drops and viscous lidocaine placed into the left eye in the pre-op holding area. After transfer to the operating room, a time out was performed. The patient was then prepped and draped. Beginning with a 75 degree blade a paracentesis port was made at the surgeon's 2 o'clock position. The anterior chamber was then filled with 1% non-preserved lidocaine. This was followed by filling the anterior chamber with Provisc. A bent cystatome needle was used to create a continuous tear capsulotomy. Hydrodissection was performed with balanced salt solution on a Fine canula. The lens nucleus was then removed using the phacoemulsification handpiece. Residual cortex was removed with the I&A handpiece. The anterior chamber and capsular bag were refilled with Provisc. A posterior chamber intraocular lens was placed into the capsular bag with it's injector. The implant was positioned with the Kuglan hook. The Provisc was then removed from the anterior chamber and capsular bag with the I&A handpiece. Stromal hydration of the main incision and paracentesis port was performed with BSS on a Fine canula. The wounds were tested for leak which was negative. The patient tolerated the procedure well. There were no operative complications. The patient was then transferred to the recovery room in stable condition.  Complications: None  Specimen: None  EBL: None  Prosthetic device: B&L enVista, MX60, power 21.5.

## 2012-06-11 NOTE — Anesthesia Postprocedure Evaluation (Signed)
  Anesthesia Post-op Note  Patient: Curtis [name]  Procedure(s) Performed: Procedure(s) with comments: CATARACT EXTRACTION PHACO AND INTRAOCULAR LENS PLACEMENT (IOC) (Right) - CDE:  14.43  Patient Location: Short Stay  Anesthesia Type:MAC  Level of Consciousness: awake, alert , oriented and patient cooperative  Airway and Oxygen Therapy: Patient Spontanous Breathing  Post-op Pain: none  Post-op Assessment: Post-op Vital signs reviewed, Patient's Cardiovascular Status Stable, Respiratory Function Stable, Patent Airway and Pain level controlled  Post-op Vital Signs: Reviewed and stable  Complications: No apparent anesthesia complications

## 2012-06-11 NOTE — H&P (Signed)
I have reviewed the H&P, the patient was re-examined, and I have identified no interval changes in medical condition and plan of care since the history and physical of record  

## 2012-06-11 NOTE — Transfer of Care (Signed)
Immediate Anesthesia Transfer of Care Note  Patient: Curtis [name]  Procedure(s) Performed: Procedure(s) with comments: CATARACT EXTRACTION PHACO AND INTRAOCULAR LENS PLACEMENT (IOC) (Right) - CDE:  14.43  Patient Location: Short Stay  Anesthesia Type:MAC  Level of Consciousness: awake, alert , oriented and patient cooperative  Airway & Oxygen Therapy: Patient Spontanous Breathing  Post-op Assessment: Report given to PACU RN and Post -op Vital signs reviewed and stable  Post vital signs: Reviewed and stable  Complications: No apparent anesthesia complications

## 2012-06-11 NOTE — Brief Op Note (Signed)
Pre-Op Dx: Cataract OD Post-Op Dx: Cataract OD Surgeon: Makai Agostinelli Anesthesia: Topical with MAC Surgery: Cataract Extraction with Intraocular lens Implant OD Implant: B&L enVista Specimen: None Complications: None 

## 2012-06-11 NOTE — Anesthesia Preprocedure Evaluation (Signed)
Anesthesia Evaluation  Patient identified by MRN, date of birth, ID band Patient awake    Reviewed: Allergy & Precautions, H&P , NPO status , Patient's Chart, lab work & pertinent test results  Airway Mallampati: II TM Distance: >3 FB     Dental  (+) Teeth Intact and Dental Advisory Given   Pulmonary shortness of breath, asthma , pneumonia -, resolved,  breath sounds clear to auscultation        Cardiovascular hypertension, Rhythm:Regular Rate:Normal     Neuro/Psych    GI/Hepatic GERD-  Medicated,  Endo/Other    Renal/GU      Musculoskeletal   Abdominal   Peds  Hematology   Anesthesia Other Findings   Reproductive/Obstetrics                           Anesthesia Physical Anesthesia Plan  ASA: III  Anesthesia Plan: MAC   Post-op Pain Management:    Induction: Intravenous  Airway Management Planned:   Additional Equipment:   Intra-op Plan:   Post-operative Plan:   Informed Consent: I have reviewed the patients History and Physical, chart, labs and discussed the procedure including the risks, benefits and alternatives for the proposed anesthesia with the patient or authorized representative who has indicated his/her understanding and acceptance.     Plan Discussed with:   Anesthesia Plan Comments:         Anesthesia Quick Evaluation  

## 2012-06-12 ENCOUNTER — Encounter (HOSPITAL_COMMUNITY): Payer: Self-pay | Admitting: Pharmacy Technician

## 2012-06-12 ENCOUNTER — Encounter (HOSPITAL_COMMUNITY): Payer: Self-pay | Admitting: Ophthalmology

## 2012-06-23 ENCOUNTER — Encounter (HOSPITAL_COMMUNITY)
Admission: RE | Admit: 2012-06-23 | Discharge: 2012-06-23 | Disposition: A | Discharge status: Home / Self Care | Payer: Medicare Other | Source: Ambulatory Visit | Attending: Ophthalmology | Admitting: Ophthalmology

## 2012-06-26 ENCOUNTER — Ambulatory Visit: Payer: Medicare Other | Admitting: Pulmonary Disease

## 2012-06-26 MED ORDER — ONDANSETRON HCL 4 MG/2ML IJ SOLN: 4.0000 mg | Freq: Once | INTRAMUSCULAR | Status: AC | PRN

## 2012-06-26 MED ORDER — LIDOCAINE HCL 3.5 % OP GEL
OPHTHALMIC | Status: AC
  Filled 2012-06-26: qty 5

## 2012-06-26 MED ORDER — CYCLOPENTOLATE-PHENYLEPHRINE 0.2-1 % OP SOLN
OPHTHALMIC | Status: AC
  Filled 2012-06-26: qty 2

## 2012-06-26 MED ORDER — LIDOCAINE HCL (PF) 1 % IJ SOLN
INTRAMUSCULAR | Status: AC
  Filled 2012-06-26: qty 2

## 2012-06-26 MED ORDER — FENTANYL CITRATE 0.05 MG/ML IJ SOLN: 25.0000 ug | INTRAMUSCULAR | Status: DC | PRN

## 2012-06-26 MED ORDER — TETRACAINE HCL 0.5 % OP SOLN
OPHTHALMIC | Status: AC
  Filled 2012-06-26: qty 2

## 2012-06-26 MED ORDER — NEOMYCIN-POLYMYXIN-DEXAMETH 3.5-10000-0.1 OP OINT
TOPICAL_OINTMENT | OPHTHALMIC | Status: AC
  Filled 2012-06-26: qty 3.5

## 2012-06-29 ENCOUNTER — Ambulatory Visit (HOSPITAL_COMMUNITY): Payer: Medicare Other | Admitting: Anesthesiology

## 2012-06-29 ENCOUNTER — Encounter (HOSPITAL_COMMUNITY): Payer: Self-pay | Admitting: *Deleted

## 2012-06-29 ENCOUNTER — Encounter (HOSPITAL_COMMUNITY): Payer: Self-pay | Admitting: Anesthesiology

## 2012-06-29 ENCOUNTER — Encounter (HOSPITAL_COMMUNITY)
Admission: RE | Disposition: A | Discharge status: Home / Self Care | Payer: Self-pay | Source: Ambulatory Visit | Attending: Ophthalmology

## 2012-06-29 ENCOUNTER — Ambulatory Visit (HOSPITAL_COMMUNITY)
Admission: RE | Admit: 2012-06-29 | Discharge: 2012-06-29 | Disposition: A | Discharge status: Home / Self Care | Payer: Medicare Other | Source: Ambulatory Visit | Attending: Ophthalmology | Admitting: Ophthalmology

## 2012-06-29 DIAGNOSIS — H2589 Other age-related cataract: Secondary | ICD-10-CM | POA: Insufficient documentation

## 2012-06-29 DIAGNOSIS — H268 Other specified cataract: Secondary | ICD-10-CM | POA: Insufficient documentation

## 2012-06-29 DIAGNOSIS — I10 Essential (primary) hypertension: Secondary | ICD-10-CM | POA: Insufficient documentation

## 2012-06-29 HISTORY — PX: CATARACT EXTRACTION W/PHACO: SHX586

## 2012-06-29 SURGERY — PHACOEMULSIFICATION, CATARACT, WITH IOL INSERTION
Anesthesia: Monitor Anesthesia Care | Site: Eye | Laterality: Left | Wound class: Clean

## 2012-06-29 MED ORDER — POVIDONE-IODINE 5 % OP SOLN
OPHTHALMIC | Status: DC | PRN
  Administered 2012-06-29: 1 via OPHTHALMIC

## 2012-06-29 MED ORDER — EPINEPHRINE HCL 1 MG/ML IJ SOLN
INTRAOCULAR | Status: DC | PRN
  Administered 2012-06-29: 12:00:00

## 2012-06-29 MED ORDER — TETRACAINE HCL 0.5 % OP SOLN
1.0000 [drp] | OPHTHALMIC | Status: AC
  Administered 2012-06-29 (×3): 1 [drp] via OPHTHALMIC

## 2012-06-29 MED ORDER — EPINEPHRINE HCL 1 MG/ML IJ SOLN
INTRAMUSCULAR | Status: AC
  Filled 2012-06-29: qty 1

## 2012-06-29 MED ORDER — FENTANYL CITRATE 0.05 MG/ML IJ SOLN: 25.0000 ug | INTRAMUSCULAR | Status: DC | PRN

## 2012-06-29 MED ORDER — CYCLOPENTOLATE-PHENYLEPHRINE 0.2-1 % OP SOLN
1.0000 [drp] | OPHTHALMIC | Status: AC
  Administered 2012-06-29 (×3): 1 [drp] via OPHTHALMIC

## 2012-06-29 MED ORDER — MIDAZOLAM HCL 2 MG/2ML IJ SOLN
INTRAMUSCULAR | Status: AC
  Filled 2012-06-29: qty 2

## 2012-06-29 MED ORDER — LIDOCAINE HCL 3.5 % OP GEL
1.0000 "application " | Freq: Once | OPHTHALMIC | Status: AC
  Administered 2012-06-29: 1 via OPHTHALMIC

## 2012-06-29 MED ORDER — LIDOCAINE HCL (PF) 1 % IJ SOLN
INTRAOCULAR | Status: DC | PRN
  Administered 2012-06-29: 12:00:00 via OPHTHALMIC

## 2012-06-29 MED ORDER — MIDAZOLAM HCL 2 MG/2ML IJ SOLN
1.0000 mg | INTRAMUSCULAR | Status: DC | PRN
  Administered 2012-06-29 (×2): 2 mg via INTRAVENOUS

## 2012-06-29 MED ORDER — NEOMYCIN-POLYMYXIN-DEXAMETH 0.1 % OP OINT
TOPICAL_OINTMENT | OPHTHALMIC | Status: DC | PRN
  Administered 2012-06-29: 1 via OPHTHALMIC

## 2012-06-29 MED ORDER — LACTATED RINGERS IV SOLN
INTRAVENOUS | Status: DC
  Administered 2012-06-29: 11:00:00 via INTRAVENOUS

## 2012-06-29 MED ORDER — BSS IO SOLN
INTRAOCULAR | Status: DC | PRN
  Administered 2012-06-29: 15 mL via INTRAOCULAR

## 2012-06-29 MED ORDER — PHENYLEPHRINE HCL 2.5 % OP SOLN
1.0000 [drp] | OPHTHALMIC | Status: AC
  Administered 2012-06-29 (×3): 1 [drp] via OPHTHALMIC

## 2012-06-29 MED ORDER — ONDANSETRON HCL 4 MG/2ML IJ SOLN: 4.0000 mg | Freq: Once | INTRAMUSCULAR | Status: DC | PRN

## 2012-06-29 MED ORDER — PROVISC 10 MG/ML IO SOLN
INTRAOCULAR | Status: DC | PRN
  Administered 2012-06-29: 8.5 mg via INTRAOCULAR

## 2012-06-29 SURGICAL SUPPLY — 31 items
CAPSULAR TENSION RING-AMO (OPHTHALMIC RELATED) IMPLANT
CLOTH BEACON ORANGE TIMEOUT ST (SAFETY) ×2 IMPLANT
EYE SHIELD UNIVERSAL CLEAR (GAUZE/BANDAGES/DRESSINGS) ×2 IMPLANT
GLOVE BIO SURGEON STRL SZ 6.5 (GLOVE) IMPLANT
GLOVE BIOGEL PI IND STRL 6.5 (GLOVE) ×1 IMPLANT
GLOVE BIOGEL PI IND STRL 7.0 (GLOVE) ×1 IMPLANT
GLOVE BIOGEL PI IND STRL 7.5 (GLOVE) IMPLANT
GLOVE BIOGEL PI INDICATOR 6.5 (GLOVE) ×1
GLOVE BIOGEL PI INDICATOR 7.0 (GLOVE) ×1
GLOVE BIOGEL PI INDICATOR 7.5 (GLOVE)
GLOVE ECLIPSE 6.5 STRL STRAW (GLOVE) IMPLANT
GLOVE ECLIPSE 7.0 STRL STRAW (GLOVE) IMPLANT
GLOVE ECLIPSE 7.5 STRL STRAW (GLOVE) IMPLANT
GLOVE EXAM NITRILE LRG STRL (GLOVE) IMPLANT
GLOVE EXAM NITRILE MD LF STRL (GLOVE) IMPLANT
GLOVE SKINSENSE NS SZ6.5 (GLOVE)
GLOVE SKINSENSE NS SZ7.0 (GLOVE)
GLOVE SKINSENSE STRL SZ6.5 (GLOVE) IMPLANT
GLOVE SKINSENSE STRL SZ7.0 (GLOVE) IMPLANT
KIT VITRECTOMY (OPHTHALMIC RELATED) IMPLANT
PAD ARMBOARD 7.5X6 YLW CONV (MISCELLANEOUS) ×2 IMPLANT
PROC W NO LENS (INTRAOCULAR LENS)
PROC W SPEC LENS (INTRAOCULAR LENS)
PROCESS W NO LENS (INTRAOCULAR LENS) IMPLANT
PROCESS W SPEC LENS (INTRAOCULAR LENS) IMPLANT
RING MALYGIN (MISCELLANEOUS) IMPLANT
SIGHTPATH CAT PROC W REG LENS (Ophthalmic Related) ×2 IMPLANT
SYR TB 1ML LL NO SAFETY (SYRINGE) ×2 IMPLANT
TAPE CLOTH SOFT 2X10 (GAUZE/BANDAGES/DRESSINGS) ×2 IMPLANT
VISCOELASTIC ADDITIONAL (OPHTHALMIC RELATED) IMPLANT
WATER STERILE IRR 250ML POUR (IV SOLUTION) ×2 IMPLANT

## 2012-06-29 NOTE — Anesthesia Postprocedure Evaluation (Signed)
  Anesthesia Post-op Note  Patient: Curtis [name]  Procedure(s) Performed: Procedure(s) with comments: CATARACT EXTRACTION PHACO AND INTRAOCULAR LENS PLACEMENT (IOC) (Left) - CDE:14.11  Patient Location: PACU and Short Stay  Anesthesia Type:MAC  Level of Consciousness: awake, alert  and oriented  Airway and Oxygen Therapy: Patient Spontanous Breathing  Post-op Pain: none  Post-op Assessment: Post-op Vital signs reviewed, Patient's Cardiovascular Status Stable, Respiratory Function Stable, Patent Airway and No signs of Nausea or vomiting  Post-op Vital Signs: Reviewed and stable  Complications: No apparent anesthesia complications

## 2012-06-29 NOTE — Op Note (Signed)
Date of Admission: 06/29/2012  Date of Surgery: 06/29/2012  Pre-Op Dx: Cataract  Left  Eye, Pseudoexfoliation syndrome Left Eye  Post-Op Dx: Cataract  Left  Eye,  Dx Code 366.19, Pseudoexfoliation syndrome Left Eye, Dx code 366.11  Surgeon: Gemma Payor, M.D.  Assistants: None  Anesthesia: Topical with MAC  Indications: Painless, progressive loss of vision with compromise of daily activities.  Surgery: Cataract Extraction with Intraocular lens Implant Left Eye  Discription: The patient had dilating drops and viscous lidocaine placed into the left eye in the pre-op holding area. After transfer to the operating room, a time out was performed. The patient was then prepped and draped. Beginning with a 75 degree blade a paracentesis port was made at the surgeon's 2 o'clock position. The anterior chamber was then filled with 1% non-preserved lidocaine. This was followed by filling the anterior chamber with Provisc. A bent cystatome needle was used to create a continuous tear capsulotomy. Hydrodissection was performed with balanced salt solution on a Fine canula. The lens nucleus was then removed using the phacoemulsification handpiece. Residual cortex was removed with the I&A handpiece. The anterior chamber and capsular bag were refilled with Provisc. A posterior chamber intraocular lens was placed into the capsular bag with it's injector. The implant was positioned with the Kuglan hook. The Provisc was then removed from the anterior chamber and capsular bag with the I&A handpiece. Stromal hydration of the main incision and paracentesis port was performed with BSS on a Fine canula. The wounds were tested for leak which was negative. The patient tolerated the procedure well. There were no operative complications. The patient was then transferred to the recovery room in stable condition.  Complications: None  Specimen: None  EBL: None  Prosthetic device: B&L enVista, MX60, power 21.5D, SN  [id number].

## 2012-06-29 NOTE — H&P (Signed)
I have reviewed the H&P, the patient was re-examined, and I have identified no interval changes in medical condition and plan of care since the history and physical of record  

## 2012-06-29 NOTE — Transfer of Care (Signed)
Immediate Anesthesia Transfer of Care Note  Patient: Curtis [name]  Procedure(s) Performed: Procedure(s) with comments: CATARACT EXTRACTION PHACO AND INTRAOCULAR LENS PLACEMENT (IOC) (Left) - CDE:14.11  Patient Location: PACU and Short Stay  Anesthesia Type:MAC  Level of Consciousness: awake  Airway & Oxygen Therapy: Patient Spontanous Breathing  Post-op Assessment: Report given to PACU RN  Post vital signs: Reviewed  Complications: No apparent anesthesia complications

## 2012-06-29 NOTE — Anesthesia Preprocedure Evaluation (Signed)
Anesthesia Evaluation  Patient identified by MRN, date of birth, ID band Patient awake    Reviewed: Allergy & Precautions, H&P , NPO status , Patient's Chart, lab work & pertinent test results  Airway Mallampati: II TM Distance: >3 FB     Dental  (+) Teeth Intact and Dental Advisory Given   Pulmonary shortness of breath, asthma , pneumonia -, resolved,  breath sounds clear to auscultation        Cardiovascular hypertension, Rhythm:Regular Rate:Normal     Neuro/Psych    GI/Hepatic GERD-  Medicated,  Endo/Other    Renal/GU      Musculoskeletal   Abdominal   Peds  Hematology   Anesthesia Other Findings   Reproductive/Obstetrics                           Anesthesia Physical Anesthesia Plan  ASA: III  Anesthesia Plan: MAC   Post-op Pain Management:    Induction: Intravenous  Airway Management Planned:   Additional Equipment:   Intra-op Plan:   Post-operative Plan:   Informed Consent: I have reviewed the patients History and Physical, chart, labs and discussed the procedure including the risks, benefits and alternatives for the proposed anesthesia with the patient or authorized representative who has indicated his/her understanding and acceptance.     Plan Discussed with:   Anesthesia Plan Comments:         Anesthesia Quick Evaluation

## 2012-06-30 ENCOUNTER — Encounter (HOSPITAL_COMMUNITY): Payer: Self-pay | Admitting: Ophthalmology

## 2012-08-12 ENCOUNTER — Other Ambulatory Visit: Payer: Self-pay

## 2012-09-09 ENCOUNTER — Ambulatory Visit (INDEPENDENT_AMBULATORY_CARE_PROVIDER_SITE_OTHER): Payer: Medicare Other | Admitting: Family Medicine

## 2012-09-09 ENCOUNTER — Encounter: Payer: Self-pay | Admitting: Family Medicine

## 2012-09-09 VITALS — BP 120/72 | Ht 73.5 in | Wt 246.5 lb

## 2012-09-09 DIAGNOSIS — E785 Hyperlipidemia, unspecified: Secondary | ICD-10-CM

## 2012-09-09 DIAGNOSIS — Z86010 Personal history of colonic polyps: Secondary | ICD-10-CM

## 2012-09-09 DIAGNOSIS — Z79899 Other long term (current) drug therapy: Secondary | ICD-10-CM

## 2012-09-09 DIAGNOSIS — J8410 Pulmonary fibrosis, unspecified: Secondary | ICD-10-CM

## 2012-09-09 DIAGNOSIS — E669 Obesity, unspecified: Secondary | ICD-10-CM | POA: Insufficient documentation

## 2012-09-09 DIAGNOSIS — Z23 Encounter for immunization: Secondary | ICD-10-CM

## 2012-09-09 DIAGNOSIS — M542 Cervicalgia: Secondary | ICD-10-CM

## 2012-09-09 DIAGNOSIS — Z125 Encounter for screening for malignant neoplasm of prostate: Secondary | ICD-10-CM

## 2012-09-09 DIAGNOSIS — IMO0002 Reserved for concepts with insufficient information to code with codable children: Secondary | ICD-10-CM | POA: Insufficient documentation

## 2012-09-09 DIAGNOSIS — G8929 Other chronic pain: Secondary | ICD-10-CM

## 2012-09-09 MED ORDER — HYDROCODONE-ACETAMINOPHEN 5-325 MG PO TABS: 1.0000 | ORAL_TABLET | ORAL | Status: DC | PRN

## 2012-09-09 NOTE — Progress Notes (Signed)
  Subjective:    Patient ID: Curtis [name], male    DOB: 03/28/1945, 67 y.o.   MRN: [id number]  Hyperlipidemia This is a chronic problem. The current episode started more than 1 year ago. The problem is controlled. There are no known factors aggravating his hyperlipidemia. He is currently on no antihyperlipidemic treatment. The current treatment provides moderate improvement of lipids. There are no compliance problems.  There are no known risk factors for coronary artery disease.   Patient states that he has been having headaches for about 3 weeks now. He wants to discuss pain medications (hydrocodone and oxycodone) that he was prescribed a few months ago by another doctor. Patient relates that he gets pinched nerve in his neck causes pain to start in the left posterior neck radiates up the back of the head causes significant pain and discomfort he denies numbness or tingling down the arm he relates hydrocodone helps wear his oxycodone causes itching he denies any allergic response to hyperbaric he denies abusing  Patient with history of psoriasis hypertension he also went through gallbladder surgery earlier this year and did well Family history noncontributory Social does not smoke    Review of Systems Positive for shortness of breath with activity denies any chest pressure denies vomiting    Objective:   Physical Exam Mild tenderness posterior aspect left side the neck lungs are clear no crackles heart regular pulse normal extremities no edema skin warm       Assessment & Plan:  #1 lung disease-continue current pulmonary medicines. Will be due to pneumonia vaccine in 2016 #2 left posterior neck pain-hydrocodone prescribed #45 one refill one every 4 hours as needed for severe pain cautioned drowsiness not for frequent use #3 hyperlipidemia Dini of labs ordered continue medication #4 followup for wellness exam #5 patient had abnormal colonoscopy several years ago needs followup  colonoscopy

## 2012-09-10 LAB — BASIC METABOLIC PANEL
BUN: 9 mg/dL (ref 6–23)
Calcium: 8.8 mg/dL (ref 8.4–10.5)
Creat: 0.9 mg/dL (ref 0.50–1.35)
Glucose, Bld: 87 mg/dL (ref 70–99)

## 2012-09-10 LAB — LIPID PANEL
Cholesterol: 149 mg/dL (ref 0–200)
Triglycerides: 135 mg/dL (ref <150)

## 2012-09-19 ENCOUNTER — Other Ambulatory Visit: Payer: Self-pay | Admitting: Family Medicine

## 2012-09-21 ENCOUNTER — Telehealth: Payer: Self-pay | Admitting: Family Medicine

## 2012-09-21 NOTE — Telephone Encounter (Addendum)
Rx sent electronically to pharmacy. Patient notified. 

## 2012-09-21 NOTE — Telephone Encounter (Signed)
Patient needs Rx for blood pressure medicine   [facility]

## 2012-09-22 ENCOUNTER — Ambulatory Visit (INDEPENDENT_AMBULATORY_CARE_PROVIDER_SITE_OTHER): Payer: Medicare Other | Admitting: Family Medicine

## 2012-09-22 ENCOUNTER — Encounter: Payer: Self-pay | Admitting: Family Medicine

## 2012-09-22 VITALS — BP 122/84 | HR 60 | Ht 73.5 in | Wt 241.0 lb

## 2012-09-22 DIAGNOSIS — Z0000 Encounter for general adult medical examination without abnormal findings: Secondary | ICD-10-CM

## 2012-09-22 NOTE — Progress Notes (Signed)
  Subjective:    Patient ID: Curtis [name], male    DOB: 1945/01/26, 67 y.o.   MRN: [id number]  HPIHere for physical.   Sometimes has a pain in groin on right side for the past 4-5 months.  AWV- Annual Wellness Visit  The patient was seen for their annual wellness visit. The patient's past medical history, surgical history, and family history were reviewed. Pertinent vaccines were reviewed ( tetanus, pneumonia, shingles, flu) The patient's medication list was reviewed and updated. The height and weight were entered. The patient's current BMI is:  Cognitive screening was completed. Outcome of Mini - WUJ:WJXBJY  Falls within the past 6 months:no Current tobacco usage:none (All patients who use tobacco were given written and verbal information on quitting)  Recent listing of emergency department/hospitalizations over the past year were reviewed.  current specialist the patient sees on a regular basis:none, he will see pulmonary at [facility] in Oct   Medicare annual wellness visit patient questionnaire was reviewed.  A written screening schedule for the patient for the next 5-10 years was given. Appropriate discussion of followup regarding next visit was discussed.       Review of Systems  Constitutional: Negative for fever, activity change and appetite change.  HENT: Negative for congestion, rhinorrhea and neck pain.   Eyes: Negative for discharge.  Respiratory: Negative for cough, chest tightness and wheezing.   Cardiovascular: Negative for chest pain.  Gastrointestinal: Negative for vomiting, abdominal pain and blood in stool.  Genitourinary: Negative for frequency and difficulty urinating.  Musculoskeletal:       Occasional r groin sharp pain  Skin: Negative for rash.  Allergic/Immunologic: Negative for environmental allergies and food allergies.  Neurological: Negative for weakness and headaches.  Psychiatric/Behavioral: Negative for agitation.       Objective:    Physical Exam  Vitals reviewed. Constitutional: He appears well-developed and well-nourished.  HENT:  Head: Normocephalic and atraumatic.  Right Ear: External ear normal.  Left Ear: External ear normal.  Nose: Nose normal.  Mouth/Throat: Oropharynx is clear and moist.  Eyes: EOM are normal. Pupils are equal, round, and reactive to light.  Neck: Normal range of motion. Neck supple. No thyromegaly present.  Cardiovascular: Normal rate, regular rhythm and normal heart sounds.   No murmur heard. Pulmonary/Chest: Effort normal and breath sounds normal. No respiratory distress. He has no wheezes.  Abdominal: Soft. Bowel sounds are normal. He exhibits no distension and no mass. There is no tenderness.  Genitourinary: Prostate normal and penis normal.  No hernia  Musculoskeletal: Normal range of motion. He exhibits no edema.  Lymphadenopathy:    He has no cervical adenopathy.  Neurological: He is alert. He exhibits normal muscle tone.  Skin: Skin is warm and dry. No erythema.  Psychiatric: He has a normal mood and affect. His behavior is normal. Judgment normal.          Assessment & Plan:  See AVS Wellness exam-overall health fairly good followup within 6 months for regular health checkup. Healthy diet, safety, exercise, compliance with medication all discussed.

## 2012-09-22 NOTE — Patient Instructions (Addendum)
Thank you for coming for your annual wellness visit.  Please follow through on any advice that was given to you by today's visit. Remember to maintain compliance with your medications as discussed today.  Also remember it is important to eat a healthy diet and to stay physically active on a daily basis.  Please follow through with any testing or recommended followup office visits as was discussed today. You are due the following test coming up:             Vaccines-pneumonia 2015    Males- Prostate Exam_ in 1 year     Finally remembered that the annual wellness visit does not take the place of regularly scheduled office visits  chronic health problems such as hypertension/diabetes/cholesterol visits.

## 2012-09-29 ENCOUNTER — Encounter: Payer: Self-pay | Admitting: *Deleted

## 2012-11-12 ENCOUNTER — Other Ambulatory Visit: Payer: Self-pay

## 2012-11-19 ENCOUNTER — Other Ambulatory Visit: Payer: Self-pay | Admitting: Family Medicine

## 2012-11-19 NOTE — Telephone Encounter (Signed)
Refill x4 

## 2013-01-22 DIAGNOSIS — J849 Interstitial pulmonary disease, unspecified: Secondary | ICD-10-CM | POA: Insufficient documentation

## 2013-02-02 ENCOUNTER — Other Ambulatory Visit: Payer: Self-pay | Admitting: *Deleted

## 2013-02-02 ENCOUNTER — Telehealth: Payer: Self-pay | Admitting: Family Medicine

## 2013-02-02 MED ORDER — HYDROCODONE-ACETAMINOPHEN 5-325 MG PO TABS: 1.0000 | ORAL_TABLET | ORAL | Status: DC | PRN

## 2013-02-02 MED ORDER — BENZONATATE 200 MG PO CAPS: 200.0000 mg | ORAL_CAPSULE | Freq: Three times a day (TID) | ORAL | Status: DC | PRN

## 2013-02-02 NOTE — Telephone Encounter (Signed)
May have refill, #45, explain to pt now a class II med , will need ov before additional

## 2013-02-02 NOTE — Telephone Encounter (Signed)
Med sent to pharm. Pt notified on voicemail.  

## 2013-02-02 NOTE — Telephone Encounter (Signed)
Having a dry cough. Off and on since November. No wheezing. No fever. NO other symptoms. Using inhalers. Seeing a lung specialist. Next appt feb 18th.  Can something be called in for cough. Cough is during the day while he is awake. [facility] [place].

## 2013-02-02 NOTE — Telephone Encounter (Signed)
Tessalon 200 mg 1 ,3 times a day when necessary cough, swallow don't chew, #21 2 refills

## 2013-02-02 NOTE — Telephone Encounter (Signed)
Patient needs Rx for hydrocodone. °

## 2013-02-05 ENCOUNTER — Other Ambulatory Visit: Payer: Self-pay | Admitting: Family Medicine

## 2013-02-05 MED ORDER — OSELTAMIVIR PHOSPHATE 75 MG PO CAPS: 75.0000 mg | ORAL_CAPSULE | Freq: Two times a day (BID) | ORAL | Status: DC

## 2013-03-22 ENCOUNTER — Other Ambulatory Visit: Payer: Self-pay | Admitting: Family Medicine

## 2013-04-12 ENCOUNTER — Telehealth: Payer: Self-pay | Admitting: Family Medicine

## 2013-04-12 NOTE — Telephone Encounter (Signed)
Letter received from [facility] concerning pt's ZOLPIDEM.  Please see letter, please advise.  (Not sure what I should do with this)

## 2013-04-18 NOTE — Telephone Encounter (Signed)
Patient went patient can currently get this medication. But in the future they might deny in because of his age. On his next visit we can discuss other options including melatonin as well as trazodone.

## 2013-05-20 ENCOUNTER — Other Ambulatory Visit: Payer: Self-pay | Admitting: Family Medicine

## 2013-05-20 NOTE — Telephone Encounter (Signed)
May refill this +3 additional refills, send patient a car that he needs to followup in June or July

## 2013-05-20 NOTE — Telephone Encounter (Signed)
Pt seen 09/22/12

## 2013-05-21 ENCOUNTER — Telehealth: Payer: Self-pay | Admitting: Family Medicine

## 2013-05-21 NOTE — Telephone Encounter (Signed)
Patients wife notified

## 2013-05-21 NOTE — Telephone Encounter (Signed)
Patient needs Rx for zolpidem to [facility].

## 2013-05-24 ENCOUNTER — Telehealth: Payer: Self-pay | Admitting: Family Medicine

## 2013-05-24 NOTE — Telephone Encounter (Signed)
Patient needs [place] to call [facility] and call in his [place]. There fax machine is broke so they have not been receiving our faxes for this.

## 2013-05-24 NOTE — Telephone Encounter (Signed)
Notified patient that [facility] called and we gave a verbal order for his medication to be filled.

## 2013-06-24 ENCOUNTER — Encounter: Payer: Self-pay | Admitting: Family Medicine

## 2013-06-25 ENCOUNTER — Ambulatory Visit (INDEPENDENT_AMBULATORY_CARE_PROVIDER_SITE_OTHER): Payer: Medicare Other | Admitting: Family Medicine

## 2013-06-25 ENCOUNTER — Encounter: Payer: Self-pay | Admitting: Family Medicine

## 2013-06-25 VITALS — BP 130/80 | Temp 97.8°F | Ht 73.5 in | Wt 246.0 lb

## 2013-06-25 DIAGNOSIS — R740 Nonspecific elevation of levels of transaminase and lactic acid dehydrogenase [LDH]: Secondary | ICD-10-CM

## 2013-06-25 DIAGNOSIS — Z79899 Other long term (current) drug therapy: Secondary | ICD-10-CM

## 2013-06-25 DIAGNOSIS — IMO0002 Reserved for concepts with insufficient information to code with codable children: Secondary | ICD-10-CM | POA: Insufficient documentation

## 2013-06-25 DIAGNOSIS — R197 Diarrhea, unspecified: Secondary | ICD-10-CM

## 2013-06-25 DIAGNOSIS — R7401 Elevation of levels of liver transaminase levels: Secondary | ICD-10-CM | POA: Insufficient documentation

## 2013-06-25 DIAGNOSIS — R109 Unspecified abdominal pain: Secondary | ICD-10-CM

## 2013-06-25 DIAGNOSIS — R17 Unspecified jaundice: Secondary | ICD-10-CM

## 2013-06-25 LAB — CBC WITH DIFFERENTIAL/PLATELET
BASOS ABS: 0 10*3/uL (ref 0.0–0.1)
BASOS PCT: 0 % (ref 0–1)
EOS ABS: 0.1 10*3/uL (ref 0.0–0.7)
Eosinophils Relative: 1 % (ref 0–5)
HCT: 47.1 % (ref 39.0–52.0)
Hemoglobin: 16.3 g/dL (ref 13.0–17.0)
LYMPHS PCT: 17 % (ref 12–46)
Lymphs Abs: 1.7 10*3/uL (ref 0.7–4.0)
MCH: 30.8 pg (ref 26.0–34.0)
MCHC: 34.6 g/dL (ref 30.0–36.0)
MCV: 89 fL (ref 78.0–100.0)
MONO ABS: 0.5 10*3/uL (ref 0.1–1.0)
Monocytes Relative: 5 % (ref 3–12)
Neutro Abs: 7.8 10*3/uL — ABNORMAL HIGH (ref 1.7–7.7)
Neutrophils Relative %: 77 % (ref 43–77)
PLATELETS: 131 10*3/uL — AB (ref 150–400)
RBC: 5.29 MIL/uL (ref 4.22–5.81)
RDW: 13.6 % (ref 11.5–15.5)
WBC: 10.1 10*3/uL (ref 4.0–10.5)

## 2013-06-25 MED ORDER — DICYCLOMINE HCL 20 MG PO TABS: 20.0000 mg | ORAL_TABLET | Freq: Three times a day (TID) | ORAL | Status: DC | PRN

## 2013-06-25 MED ORDER — DIPHENOXYLATE-ATROPINE 2.5-0.025 MG PO TABS: 1.0000 | ORAL_TABLET | Freq: Four times a day (QID) | ORAL | Status: DC | PRN

## 2013-06-25 NOTE — Progress Notes (Signed)
   Subjective:    Patient ID: Curtis [name], male    DOB: 08-Sep-1945, 68 y.o.   MRN: [id number]  Abdominal Pain This is a new problem. The current episode started in the past 7 days. The onset quality is sudden. The problem occurs constantly. The problem has been unchanged. The pain is located in the generalized abdominal region. The pain is moderate. The quality of the pain is cramping. The abdominal pain does not radiate. Associated symptoms include diarrhea and flatus. Pertinent negatives include no fever or nausea. Associated symptoms comments: bloating. Nothing aggravates the pain. The pain is relieved by nothing. Treatments tried: Immodium. The treatment provided mild relief.  Patient states he has blurred vision in right eye.  He denies any fevers currently   Review of Systems  Constitutional: Positive for chills, appetite change (less) and fatigue. Negative for fever and activity change.  HENT: Negative for congestion.   Respiratory: Negative for cough and shortness of breath.   Cardiovascular: Negative for chest pain.  Gastrointestinal: Positive for abdominal pain, diarrhea and flatus. Negative for nausea.   he should be noted that this patient shortness of breath is improved on the prednisone but he is nowhere near normal he has pulmonary fibrosis with disability CT scan from one year ago did not show any aortic aneurysm    Objective:   Physical Exam  His lungs are clear hearts regular pulse normal abdomen soft no masses extremities no edema slight tenderness left side no guarding or rebound      Assessment & Plan:  Loose stools we will use Lomotil also recommend stool specimens. We will also check a liver enzymes because of history of elevated liver enzymes and probable cirrhosis. No antibiotics indicated currently

## 2013-06-26 LAB — HEPATIC FUNCTION PANEL
ALBUMIN: 3.7 g/dL (ref 3.5–5.2)
ALT: 21 U/L (ref 0–53)
AST: 32 U/L (ref 0–37)
Alkaline Phosphatase: 68 U/L (ref 39–117)
BILIRUBIN INDIRECT: 1.8 mg/dL — AB (ref 0.2–1.2)
Bilirubin, Direct: 0.5 mg/dL — ABNORMAL HIGH (ref 0.0–0.3)
TOTAL PROTEIN: 7.5 g/dL (ref 6.0–8.3)
Total Bilirubin: 2.3 mg/dL — ABNORMAL HIGH (ref 0.2–1.2)

## 2013-06-26 LAB — BASIC METABOLIC PANEL
BUN: 14 mg/dL (ref 6–23)
CO2: 28 mEq/L (ref 19–32)
Calcium: 9.3 mg/dL (ref 8.4–10.5)
Chloride: 101 mEq/L (ref 96–112)
Creat: 1.06 mg/dL (ref 0.50–1.35)
Glucose, Bld: 100 mg/dL — ABNORMAL HIGH (ref 70–99)
Potassium: 4.8 mEq/L (ref 3.5–5.3)
SODIUM: 137 meq/L (ref 135–145)

## 2013-06-28 LAB — CLOSTRIDIUM DIFFICILE BY PCR: Toxigenic C. Difficile by PCR: NOT DETECTED

## 2013-06-29 LAB — FECAL LACTOFERRIN, QUANT: Lactoferrin: POSITIVE

## 2013-06-30 LAB — STOOL CULTURE

## 2013-06-30 NOTE — Addendum Note (Signed)
Addended byCharolotte Capuchin D on: 06/30/2013 10:12 AM   Modules accepted: Orders

## 2013-07-02 ENCOUNTER — Ambulatory Visit (HOSPITAL_COMMUNITY): Payer: Medicare Other

## 2013-07-21 ENCOUNTER — Other Ambulatory Visit: Payer: Self-pay | Admitting: Family Medicine

## 2013-08-27 ENCOUNTER — Telehealth: Payer: Self-pay | Admitting: Family Medicine

## 2013-08-27 MED ORDER — ZOLPIDEM TARTRATE 10 MG PO TABS: ORAL_TABLET | ORAL | Status: DC

## 2013-08-27 NOTE — Telephone Encounter (Signed)
6 mo worth ok 

## 2013-08-27 NOTE — Telephone Encounter (Signed)
Rx faxed to pharmacy. Patient notified. 

## 2013-08-27 NOTE — Telephone Encounter (Signed)
zolpidem (AMBIEN) 10 MG tablet  Pts spouse states they have been trying since  Wed 8/19 to get this med filled  [facility]

## 2013-09-02 ENCOUNTER — Telehealth: Payer: Self-pay | Admitting: Family Medicine

## 2013-09-02 NOTE — Telephone Encounter (Signed)
Rx prior auth APPROVED for pt's Zolpidem tartrate 10mg  tab, expires 08/27/14 per [facility] w/[employer]

## 2013-09-18 ENCOUNTER — Other Ambulatory Visit: Payer: Self-pay | Admitting: Family Medicine

## 2013-09-20 NOTE — Telephone Encounter (Signed)
Last seen 06/25/13.

## 2013-09-28 ENCOUNTER — Ambulatory Visit (INDEPENDENT_AMBULATORY_CARE_PROVIDER_SITE_OTHER): Payer: Medicare Other

## 2013-09-28 ENCOUNTER — Telehealth: Payer: Self-pay | Admitting: Family Medicine

## 2013-09-28 ENCOUNTER — Other Ambulatory Visit: Payer: Self-pay | Admitting: *Deleted

## 2013-09-28 DIAGNOSIS — Z23 Encounter for immunization: Secondary | ICD-10-CM

## 2013-09-28 MED ORDER — HYDROCODONE-ACETAMINOPHEN 5-325 MG PO TABS: 1.0000 | ORAL_TABLET | ORAL | Status: DC | PRN

## 2013-09-28 NOTE — Telephone Encounter (Signed)
May have this refill, due to [facility] rules he will need OV before next refill ( he may want to schedule accordingly this fall to avoid running out of medication)

## 2013-09-28 NOTE — Telephone Encounter (Signed)
HYDROcodone-acetaminophen (NORCO/VICODIN) 5-325 MG per tablet  Pt has had this in the past, appears last fill date was June 2015  Needs for should, knee pain for intermittent use   Would like to pick up by 2 this afternoon when he comes in for his Flu Shot

## 2013-09-28 NOTE — Telephone Encounter (Signed)
Script ready at nurse's station.  Pt will pick up at nurse visit today.

## 2013-11-20 ENCOUNTER — Other Ambulatory Visit: Payer: Self-pay | Admitting: Family Medicine

## 2013-11-27 ENCOUNTER — Other Ambulatory Visit: Payer: Self-pay | Admitting: Family Medicine

## 2013-11-28 NOTE — Telephone Encounter (Signed)
This patient may have this prescription +2 additional refills. Please send him a card he is due for wellness exam and this needs to be completed within the next 60 days he should call and schedule.

## 2013-11-29 ENCOUNTER — Other Ambulatory Visit: Payer: Self-pay

## 2013-11-29 ENCOUNTER — Other Ambulatory Visit: Payer: Self-pay | Admitting: *Deleted

## 2013-11-29 MED ORDER — ZOLPIDEM TARTRATE 10 MG PO TABS: ORAL_TABLET | ORAL | Status: DC

## 2013-12-23 ENCOUNTER — Telehealth: Payer: Self-pay | Admitting: Family Medicine

## 2013-12-23 NOTE — Telephone Encounter (Signed)
Last seen 06/2013

## 2013-12-23 NOTE — Telephone Encounter (Signed)
Pt is requesting a refill on hydrocodone.  [facility]

## 2013-12-23 NOTE — Telephone Encounter (Signed)
This is now a class II medicine this patient may not understand that it just can't simply be refilled are called in without being seen on a regular basis essentially every 3 months since he has not been seen in greater than 3 months he may have a prescription for 20 tablets and he will need to set up an appointment in January thank you

## 2013-12-23 NOTE — Telephone Encounter (Signed)
I don't think he understood that fact that it is a routine office visit for this med,  He did say however, that he has a change in insurance (which is going to cost him  Significantly more for the hydrocodone script)  come the new year an  That was why he was requesting to fill it till he came back in for his next visit.

## 2013-12-24 NOTE — Telephone Encounter (Signed)
pts family calling to check on this for him, are we still giving him  The 20 days only

## 2014-02-03 LAB — HM DIABETES EYE EXAM

## 2014-02-18 ENCOUNTER — Other Ambulatory Visit: Payer: Self-pay | Admitting: Family Medicine

## 2014-03-01 ENCOUNTER — Encounter: Payer: Self-pay | Admitting: Family Medicine

## 2014-03-01 ENCOUNTER — Ambulatory Visit (INDEPENDENT_AMBULATORY_CARE_PROVIDER_SITE_OTHER): Payer: Medicare HMO | Admitting: Family Medicine

## 2014-03-01 VITALS — BP 128/86 | Ht 73.5 in | Wt 259.0 lb

## 2014-03-01 DIAGNOSIS — R7401 Elevation of levels of liver transaminase levels: Secondary | ICD-10-CM

## 2014-03-01 DIAGNOSIS — K219 Gastro-esophageal reflux disease without esophagitis: Secondary | ICD-10-CM

## 2014-03-01 DIAGNOSIS — M549 Dorsalgia, unspecified: Secondary | ICD-10-CM

## 2014-03-01 DIAGNOSIS — I10 Essential (primary) hypertension: Secondary | ICD-10-CM

## 2014-03-01 DIAGNOSIS — G8929 Other chronic pain: Secondary | ICD-10-CM

## 2014-03-01 DIAGNOSIS — R740 Nonspecific elevation of levels of transaminase and lactic acid dehydrogenase [LDH]: Secondary | ICD-10-CM

## 2014-03-01 DIAGNOSIS — E785 Hyperlipidemia, unspecified: Secondary | ICD-10-CM

## 2014-03-01 DIAGNOSIS — J8410 Pulmonary fibrosis, unspecified: Secondary | ICD-10-CM

## 2014-03-01 DIAGNOSIS — R739 Hyperglycemia, unspecified: Secondary | ICD-10-CM

## 2014-03-01 LAB — LIPID PANEL
Cholesterol: 164 mg/dL (ref 0–200)
HDL: 29 mg/dL — ABNORMAL LOW (ref >=40)
LDL Cholesterol: 109 mg/dL — ABNORMAL HIGH (ref 0–99)
TRIGLYCERIDES: 129 mg/dL (ref <150)
Total CHOL/HDL Ratio: 5.7 Ratio
VLDL: 26 mg/dL (ref 0–40)

## 2014-03-01 LAB — HEMOGLOBIN A1C
HEMOGLOBIN A1C: 5.8 % — AB (ref <5.7)
Mean Plasma Glucose: 120 mg/dL — ABNORMAL HIGH (ref <117)

## 2014-03-01 LAB — HEPATIC FUNCTION PANEL
ALK PHOS: 67 U/L (ref 39–117)
ALT: 22 U/L (ref 0–53)
AST: 25 U/L (ref 0–37)
Albumin: 3.9 g/dL (ref 3.5–5.2)
BILIRUBIN INDIRECT: 1.4 mg/dL — AB (ref 0.2–1.2)
Bilirubin, Direct: 0.3 mg/dL (ref 0.0–0.3)
Total Bilirubin: 1.7 mg/dL — ABNORMAL HIGH (ref 0.2–1.2)
Total Protein: 7.8 g/dL (ref 6.0–8.3)

## 2014-03-01 LAB — BASIC METABOLIC PANEL
BUN: 12 mg/dL (ref 6–23)
CALCIUM: 9.5 mg/dL (ref 8.4–10.5)
CO2: 29 mEq/L (ref 19–32)
Chloride: 102 mEq/L (ref 96–112)
Creat: 0.95 mg/dL (ref 0.50–1.35)
Glucose, Bld: 84 mg/dL (ref 70–99)
Potassium: 4.3 mEq/L (ref 3.5–5.3)
Sodium: 141 mEq/L (ref 135–145)

## 2014-03-01 MED ORDER — HYDROCHLOROTHIAZIDE 25 MG PO TABS: 25.0000 mg | ORAL_TABLET | Freq: Every day | ORAL | Status: DC

## 2014-03-01 MED ORDER — ZOLPIDEM TARTRATE 10 MG PO TABS: ORAL_TABLET | ORAL | Status: DC

## 2014-03-01 MED ORDER — HYDROCODONE-ACETAMINOPHEN 5-325 MG PO TABS: 1.0000 | ORAL_TABLET | ORAL | Status: DC | PRN

## 2014-03-01 NOTE — Progress Notes (Signed)
   Subjective:    Patient ID: Curtis [name], male    DOB: 1945-01-14, 69 y.o.   MRN: [id number]  Hypertension This is a chronic problem. The current episode started more than 1 year ago. Pertinent negatives include no chest pain.   Takes hydrocodone about once every 3 -4 weeks. This patient does have chronic intermittent back pain as well as knee pain he only uses hydrocodone intermittently does not use it often he will need a prescription today He does have a history of insomnia uses it intermittently needs a prescription today.  Needs all refills sent to [facility] except [facility]ien [facility].   Concerns about feeling shaky/nervous. Goes away after eating or drinking. This does not happen frequently. He does try to eat on a regular basis stays physically active. Could be related to slight sugar changes we will be checking some lab work  Dr [facility] -optho . This is his ophthalmologist who is following him closely Review of Systems  Constitutional: Negative for activity change, appetite change and fatigue.  HENT: Negative for congestion.   Respiratory: Negative for cough.   Cardiovascular: Negative for chest pain.  Gastrointestinal: Negative for abdominal pain.  Endocrine: Negative for polydipsia and polyphagia.  Neurological: Negative for weakness.  Psychiatric/Behavioral: Negative for confusion.       Objective:   Physical Exam  Constitutional: He appears well-nourished. No distress.  Cardiovascular: Normal rate, regular rhythm and normal heart sounds.   No murmur heard. Pulmonary/Chest: Effort normal and breath sounds normal. No respiratory distress.  Musculoskeletal: He exhibits no edema.  Lymphadenopathy:    He has no cervical adenopathy.  Neurological: He is alert.  Psychiatric: His behavior is normal.  Vitals reviewed.         Assessment & Plan:  1. INTERSTITIAL LUNG DISEASE His interstitial lung disease is stable. They will see him back again later this year.  He understands that if he starts getting flu like illness he needs to be seen right away - Lipid panel - Basic metabolic panel - PSA - Hemoglobin A1c - Hepatic function panel  2. Hyperglycemia He does have a history hyperglycemia I do recommend checking hemoglobin A1c he is on prednisone I told him to watch carbohydrate intake closely - Lipid panel - Basic metabolic panel - PSA - Hemoglobin A1c - Hepatic function panel  3. Elevated transaminase level He does have a history of elevated transaminase level as well as elevated bilirubin we will check liver profile. - Lipid panel - Basic metabolic panel - PSA - Hemoglobin A1c - Hepatic function panel  4. Essential hypertension Blood pressure under decent control continue current measures follow-up in 6 months. Watch diet closely. - Lipid panel - Basic metabolic panel - PSA - Hemoglobin A1c - Hepatic function panel  5. Gastroesophageal reflux disease without esophagitis His reflexes doing well he is tolerating medicines well not having any flareups. - Lipid panel - Basic metabolic panel - PSA - Hemoglobin A1c - Hepatic function panel  6. Dyslipidemia He does have history of cholesterol issues. Cannot tolerate statins. Check lipid profile await the results - Lipid panel - Basic metabolic panel - PSA - Hemoglobin A1c - Hepatic function panel  25 minutes spent with patient greater than half in discussion of multiple issues Wellness exam somewhere within the next 90 days. Follow-up office visit by early fall.

## 2014-03-02 ENCOUNTER — Encounter: Payer: Self-pay | Admitting: Family Medicine

## 2014-03-02 LAB — PSA: PSA: 0.68 ng/mL (ref <=4.00)

## 2014-03-14 ENCOUNTER — Ambulatory Visit: Payer: Medicare HMO | Admitting: Family Medicine

## 2014-03-30 ENCOUNTER — Ambulatory Visit: Payer: Medicare HMO | Admitting: Neurology

## 2014-04-01 ENCOUNTER — Ambulatory Visit (INDEPENDENT_AMBULATORY_CARE_PROVIDER_SITE_OTHER): Payer: Medicare HMO | Admitting: Neurology

## 2014-04-01 ENCOUNTER — Encounter: Payer: Self-pay | Admitting: Neurology

## 2014-04-01 VITALS — BP 130/92 | HR 88 | Resp 16 | Ht 73.0 in | Wt 259.0 lb

## 2014-04-01 DIAGNOSIS — G500 Trigeminal neuralgia: Secondary | ICD-10-CM

## 2014-04-01 MED ORDER — GABAPENTIN 300 MG PO CAPS: ORAL_CAPSULE | ORAL | Status: DC

## 2014-04-01 NOTE — Progress Notes (Signed)
PATIENT: Curtis [name] DOB: Sep 03, 1945  HISTORICAL  Curtis [name] is a 69 ambidextrous male, referred by his dentist Dr. Modena Nunnery for evaluation of right jaw pain, he is with his wife,   He had a history of interstitial pulmonary fibrosis since 2012, taking prednisone 10 mg daily, not oxygen dependent, since 2014, he had intermittent right lower jaw pain, but it was tolerable, since January 2016, he had increased frequency, and severity of shooting pain starting from right lower molar region worse his right ear, increased frequency, to the point of difficulty drinking, eating, sometimes paresthesia at right lower lip, occasionally right upper lip, he has difficulty eating, brushing his teeth, triggered by touch, movement,  He denies visual loss, had oral x-ray, there was no significant abnormality found He has to take oxycodone, only with temporary relief  REVIEW OF SYSTEMS: Full 14 system review of systems performed and notable only for hearing loss, trouble swallowing, blurry vision, shortness of breath, cough, wheezing, easy bruising, joints pain, allergy, headaches, difficulty swallowing, insomnia  ALLERGIES: Allergies  Allergen Reactions  . Oxycodone Itching  . Pravastatin Other (See Comments)    Chills    . Simvastatin Other (See Comments)    Chills  Chills   . Prednisone Anxiety    Irritation    HOME MEDICATIONS: Current Outpatient Prescriptions  Medication Sig Dispense Refill  . hydrochlorothiazide (HYDRODIURIL) 25 MG tablet Take 1 tablet (25 mg total) by mouth daily. 30 tablet 6  . HYDROcodone-acetaminophen (NORCO/VICODIN) 5-325 MG per tablet Take 1 tablet by mouth every 4 (four) hours as needed. 45 tablet 0  . latanoprost (XALATAN) 0.005 % ophthalmic solution     . naproxen sodium (ALEVE) 220 MG tablet Take 440 mg by mouth daily as needed. For pain/relief    . omeprazole (PRILOSEC) 20 MG capsule Take 20 mg by mouth daily as needed. For acid reflux    .  PREDNISONE PO Take 10 mg by mouth daily.     Marland Kitchen zolpidem (AMBIEN) 10 MG tablet take 1 tablet by mouth at bedtime if needed 30 tablet 5     PAST MEDICAL HISTORY: Past Medical History  Diagnosis Date  . Hypertension   . Dyslipidemia     Statin intolerant  . Idiopathic pulmonary fibrosis   . Arthritis   . Chest pain     Emergency room October 24, 2011, no MI  //   Nuclear, October, 2013, adenosine, EF 61%, no scar or ischemia  . Psoriasis   . Statin intolerance   . GERD (gastroesophageal reflux disease)   . Asthma   . Left shoulder pain 2011  . Cervical spondylosis   . Interstitial pulmonary fibrosis 2012  . Glaucoma   . Hearing loss     PAST SURGICAL HISTORY: Past Surgical History  Procedure Laterality Date  . Neck surgery      cervical disc with cliip  . Colonoscopy    . Cholecystectomy  05/08/2012  . Cholecystectomy N/A 05/08/2012    Procedure: LAPAROSCOPIC CHOLECYSTECTOMY;  Surgeon: Harl Bowie, MD;  Location: [facility];  Service: General;  Laterality: N/A;  . Liver biopsy N/A 05/08/2012    Procedure: LIVER BIOPSY;  Surgeon: Harl Bowie, MD;  Location: [facility];  Service: General;  Laterality: N/A;  . Cataract extraction w/phaco Right 06/11/2012    Procedure: CATARACT EXTRACTION PHACO AND INTRAOCULAR LENS PLACEMENT ([place]);  Surgeon: Tonny Branch, MD;  Location: [facility];  Service: Ophthalmology;  Laterality: Right;  CDE:  14.43  .  Cataract extraction w/phaco Left 06/29/2012    Procedure: CATARACT EXTRACTION PHACO AND INTRAOCULAR LENS PLACEMENT ([facility]);  Surgeon: Tonny Branch, MD;  Location: [facility];  Service: Ophthalmology;  Laterality: Left;  CDE:14.11    FAMILY HISTORY: Family History  Problem Relation Age of Onset  . Cancer Sister     breast  . Hypertension Mother   . Congestive Heart Failure Mother   . COPD Mother   . Stroke Father     SOCIAL HISTORY:  History   Social History  . Marital Status: Married    Spouse Name: N/A  . Number of Children: N/A  . Years of  Education: N/A   Occupational History  . Not on file.   Social History Main Topics  . Smoking status: Former Smoker -- 0.50 packs/day for 52 years    Types: Cigarettes    Quit date: 09/08/2003  . Smokeless tobacco: Never Used  . Alcohol Use: No     Comment: quit drinking 2004  . Drug Use: No  . Sexual Activity: Yes    Birth Control/ Protection: None   Other Topics Concern  . Not on file   Social History Narrative     PHYSICAL EXAM   Filed Vitals:   04/01/14 0940  BP: 130/92  Pulse: 88  Resp: 16  Height: 6\' 1"  (1.854 m)  Weight: 259 lb (117.482 kg)    Not recorded      Body mass index is 34.18 kg/(m^2).  PHYSICAL EXAMNIATION:  Gen: NAD, conversant, well nourised, obese, well groomed                     Cardiovascular: Regular rate rhythm, no peripheral edema, warm, nontender. Eyes: Conjunctivae clear without exudates or hemorrhage Neck: Supple, no carotid bruise. Pulmonary: Clear to auscultation bilaterally   NEUROLOGICAL EXAM:  MENTAL STATUS: Speech:    Speech is normal; fluent and spontaneous with normal comprehension.  Cognition:    The patient is oriented to person, place, and time;     recent and remote memory intact;     language fluent;     normal attention, concentration,     fund of knowledge.  CRANIAL NERVES: CN II: Visual fields are full to confrontation. Fundoscopic exam is normal with sharp discs and no vascular changes. Venous pulsations are present bilaterally. Pupils are 4 mm and briskly reactive to light. Visual acuity is 20/20 bilaterally. CN III, IV, VI: extraocular movement are normal. No ptosis. CN V: Facial sensation is intact to pinprick in all 3 divisions bilaterally. Corneal responses are intact.  CN VII: Face is symmetric with normal eye closure and smile. CN VIII: Hearing is normal to rubbing fingers CN IX, X: Palate elevates symmetrically. Phonation is normal. He has no right lower molar CN XI: Head turning and shoulder  shrug are intact CN XII: Tongue is midline with normal movements and no atrophy.  MOTOR: There is no pronator drift of out-stretched arms. Muscle bulk and tone are normal. Muscle strength is normal.   Shoulder abduction Shoulder external rotation Elbow flexion Elbow extension Wrist flexion Wrist extension Finger abduction Hip flexion Knee flexion Knee extension Ankle dorsi flexion Ankle plantar flexion  R 5 5 5 5 5 5 5 5 5 5 5 5   L 5 5 5 5 5 5 5 5 5 5 5 5     REFLEXES: Reflexes are 2+ and symmetric at the biceps, triceps, knees, and ankles. Plantar responses are flexor.  SENSORY: Light touch, pinprick,  position sense, and vibration sense are intact in fingers and toes.  COORDINATION: Rapid alternating movements and fine finger movements are intact. There is no dysmetria on finger-to-nose and heel-knee-shin. There are no abnormal or extraneous movements.   GAIT/STANCE: Posture is normal. Gait is steady with normal steps, base, arm swing, and turning. Heel and toe walking are normal. Tandem gait is normal.  Romberg is absent.   DIAGNOSTIC DATA (LABS, IMAGING, TESTING) - I reviewed patient records, labs, notes, testing and imaging myself where available.  Lab Results  Component Value Date   WBC 10.1 06/25/2013   HGB 16.3 06/25/2013   HCT 47.1 06/25/2013   MCV 89.0 06/25/2013   PLT 131* 06/25/2013      Component Value Date/Time   NA 141 03/01/2014 0940   K 4.3 03/01/2014 0940   CL 102 03/01/2014 0940   CO2 29 03/01/2014 0940   GLUCOSE 84 03/01/2014 0940   BUN 12 03/01/2014 0940   CREATININE 0.95 03/01/2014 0940   CREATININE 1.01 05/09/2012 0404   CALCIUM 9.5 03/01/2014 0940   PROT 7.8 03/01/2014 0940   ALBUMIN 3.9 03/01/2014 0940   AST 25 03/01/2014 0940   ALT 22 03/01/2014 0940   ALKPHOS 67 03/01/2014 0940   BILITOT 1.7* 03/01/2014 0940   GFRNONAA 74* 05/09/2012 0404   GFRAA 86* 05/09/2012 0404   Lab Results  Component Value Date   CHOL 164 03/01/2014   HDL 29*  03/01/2014   LDLCALC 109* 03/01/2014   TRIG 129 03/01/2014   CHOLHDL 5.7 03/01/2014   Lab Results  Component Value Date   HGBA1C 5.8* 03/01/2014   No results found for: VITAMINB12 No results found for: TSH    ASSESSMENT AND PLAN  Curtis [name] is a 69 y.o. male  with history of pulmonary fibrosis, presenting with intermittent right facial pain, consistent with right trigeminal neuralgia  1. MRI of the brain with without contrast 2. Neurontin 300 mg titrating to 3 tablets 3 times a day 3. return to clinic in 2 weeks  Marcial Pacas, M.D. Ph.D.  [facility] [address] [place], [place] [address] Ph: [phone] Fax: [phone]

## 2014-04-05 ENCOUNTER — Ambulatory Visit (INDEPENDENT_AMBULATORY_CARE_PROVIDER_SITE_OTHER): Payer: Medicare HMO | Admitting: Family Medicine

## 2014-04-05 ENCOUNTER — Encounter: Payer: Self-pay | Admitting: Family Medicine

## 2014-04-05 VITALS — BP 118/72 | Ht 73.5 in | Wt 260.6 lb

## 2014-04-05 DIAGNOSIS — Z0000 Encounter for general adult medical examination without abnormal findings: Secondary | ICD-10-CM

## 2014-04-05 NOTE — Progress Notes (Signed)
   Subjective:    Patient ID: Curtis [name], male    DOB: [birth year], 69 y.o.   MRN: [id number]  HPI AWV- Annual Wellness Visit  The patient was seen for their annual wellness visit. The patient's past medical history, surgical history, and family history were reviewed. Pertinent vaccines were reviewed ( tetanus, pneumonia, shingles, flu) The patient's medication list was reviewed and updated.  The height and weight were entered. The patient's current BMI is:33.9  Cognitive screening was completed. Outcome of Mini - Cog: pass  Falls within the past 6 months:none  Current tobacco usage: no (All patients who use tobacco were given written and verbal information on quitting)  Recent listing of emergency department/hospitalizations over the past year were reviewed.  current specialist the patient sees on a regular basis: yes   Medicare annual wellness visit patient questionnaire was reviewed.  A written screening schedule for the patient for the next 5-10 years was given. Appropriate discussion of followup regarding next visit was discussed.   Seeing neurologist- having facial nerve pain-just started gabapentin- Patient also had a cold and having to use more oxygen then normal.   Review of Systems  Constitutional: Negative for fever, activity change and appetite change.  HENT: Negative for congestion and rhinorrhea.   Eyes: Negative for discharge.  Respiratory: Positive for shortness of breath and wheezing. Negative for cough.   Cardiovascular: Negative for chest pain.  Gastrointestinal: Negative for vomiting, abdominal pain and blood in stool.  Genitourinary: Negative for frequency and difficulty urinating.  Musculoskeletal: Negative for neck pain.  Skin: Negative for rash.  Allergic/Immunologic: Negative for environmental allergies and food allergies.  Neurological: Negative for weakness and headaches.  Psychiatric/Behavioral: Negative for agitation.         Objective:   Physical Exam  Constitutional: He appears well-developed and well-nourished.  HENT:  Head: Normocephalic and atraumatic.  Right Ear: External ear normal.  Left Ear: External ear normal.  Nose: Nose normal.  Mouth/Throat: Oropharynx is clear and moist.  Eyes: EOM are normal. Pupils are equal, round, and reactive to light.  Neck: Normal range of motion. Neck supple. No thyromegaly present.  Cardiovascular: Normal rate, regular rhythm and normal heart sounds.   No murmur heard. Pulmonary/Chest: Effort normal. No respiratory distress. He has wheezes.  Abdominal: Soft. Bowel sounds are normal. He exhibits no distension and no mass. There is no tenderness.  Genitourinary: Rectum normal, prostate normal and penis normal.  Musculoskeletal: Normal range of motion. He exhibits no edema.  Lymphadenopathy:    He has no cervical adenopathy.  Neurological: He is alert. He exhibits normal muscle tone.  Skin: Skin is warm and dry. No erythema.  Psychiatric: He has a normal mood and affect. His behavior is normal. Judgment normal.    Patient denies any fever any discolored drainage relate some coughing some wheezing chronic for pulmonary fibrosis      Assessment & Plan:  Patient has pulmonary fibrosis it is stable. He see specialist again in a few months they have a mom prednisone.  History of prediabetes needs watch diet closely and try to keep starch intake under control be careful with diet try to keep weight under control  Safety dietary measures reviewed  Follow-up in 6 months

## 2014-04-06 ENCOUNTER — Telehealth: Payer: Self-pay | Admitting: Neurology

## 2014-04-06 NOTE — Telephone Encounter (Signed)
[facility] calling asking that the MRI Brain to be changed to MRI Orbits due to the diagnosis being trigeminal neuralgia per their protocol.

## 2014-04-06 NOTE — Telephone Encounter (Signed)
Spoke with Dr. Jannifer Franklin, he said to keep MRI as ordered.  Sasser aware.

## 2014-04-07 ENCOUNTER — Telehealth: Payer: Self-pay | Admitting: Family Medicine

## 2014-04-07 MED ORDER — ALBUTEROL SULFATE HFA 108 (90 BASE) MCG/ACT IN AERS: 2.0000 | INHALATION_SPRAY | Freq: Four times a day (QID) | RESPIRATORY_TRACT | Status: DC | PRN

## 2014-04-07 MED ORDER — AMOXICILLIN 500 MG PO CAPS: 500.0000 mg | ORAL_CAPSULE | Freq: Three times a day (TID) | ORAL | Status: DC

## 2014-04-07 NOTE — Telephone Encounter (Signed)
Patients spouse is calling to speak to a nurse regarding the appointment that patient had earlier this week.  That was all the info she wanted to give me.  There is a DPR on file for [place] to be able to speak with her.

## 2014-04-07 NOTE — Telephone Encounter (Signed)
Pt seen on Tuesday for a physical with Dr. Nicki Reaper. Wife called and said that he has had a cough on and off for a month now along with wheezing at night. Would like something sent into pharmacy. No fever. (Has pulmonary fibrosis and is on prednisone 10mg  daily.)

## 2014-04-07 NOTE — Telephone Encounter (Signed)
Patient's wife notified and verbalized understanding.  

## 2014-04-07 NOTE — Telephone Encounter (Signed)
amox 500 tid ten d, albuterol spray two spr q 6 hrs prn wheeze

## 2014-04-08 ENCOUNTER — Telehealth: Payer: Self-pay | Admitting: Neurology

## 2014-04-08 MED ORDER — CARBAMAZEPINE 100 MG PO CHEW: CHEWABLE_TABLET | ORAL | Status: DC

## 2014-04-08 NOTE — Telephone Encounter (Signed)
WID - Patient's spouse stated he has increased Rx  gabapentin (NEURONTIN) 300 MG capsule to 3 tabs, 3 times a day has instructed.  Also taking Rx HYDROcodone-acetaminophen (NORCO/VICODIN) 5-325 MG per tablet that was written by another MD to help with pain, due to Gabapentin alone has not been beneficial.   Questioning what should they do?  Please call and advise.

## 2014-04-08 NOTE — Telephone Encounter (Signed)
I called the patient, talk with the wife. The patient is on 900 mg 3 times daily of the gabapentin, still not getting good pain control. We will add carbamazepine. Taking 100 mg twice daily for 5 days, then go to 200 mg twice daily.

## 2014-04-11 ENCOUNTER — Telehealth: Payer: Self-pay | Admitting: Family Medicine

## 2014-04-11 ENCOUNTER — Telehealth: Payer: Self-pay | Admitting: Neurology

## 2014-04-11 MED ORDER — PREDNISONE 20 MG PO TABS: ORAL_TABLET | ORAL | Status: DC

## 2014-04-11 MED ORDER — PREGABALIN 100 MG PO CAPS: 100.0000 mg | ORAL_CAPSULE | Freq: Three times a day (TID) | ORAL | Status: DC

## 2014-04-11 NOTE — Telephone Encounter (Signed)
He is aware of new treatment plan - agreeable to try Lyrica.  He will pick up rx 04/12/14.

## 2014-04-11 NOTE — Telephone Encounter (Signed)
Sharyn Lull, please let patient know, I have called in lyrica 100mg  tid for him, he should stop taking gabapentin 300mg  3 tabs tid, once he got the prescription of Lyrica, he should not overlap Lyrica with gabapentin,  Continue MRI of the brain, and follow-up visit

## 2014-04-11 NOTE — Telephone Encounter (Signed)
Pt seen 3/29 for annual exam but was having difficulty breathing at the time You told him if he didn't get better to call back for you to send in some  Prednisone . He is still wheezing   [place]

## 2014-04-11 NOTE — Telephone Encounter (Signed)
Send in prednisone 20 mg, 3qd for 3d then 2qd for 3d then 1qd for 3d, if fever or nasty phlegm then important to let us know

## 2014-04-11 NOTE — Addendum Note (Signed)
Addended by: Marcial Pacas on: 04/11/2014 02:45 PM   Modules accepted: Orders

## 2014-04-11 NOTE — Telephone Encounter (Signed)
Pt's wife is calling stating pt is having side effects of burning, body aches and bad headaches while taking gabapentin (NEURONTIN) 300 MG capsule, he was also given carbamazepine (TEGRETOL) 100 MG chewable tablet which is causing blurred vision.  Please call and advise.

## 2014-04-11 NOTE — Telephone Encounter (Signed)
Call spouse to let know its been sent at   [phone]

## 2014-04-11 NOTE — Telephone Encounter (Signed)
Reports still having pain mouth/facial pain after increasing gabapentin 300mg , three capsules TID.  He called in Friday and carbamazapine 100mg  with titration instructions was added to his treatment.  He added the Tegretol 100mg  - took it Friday night, Saturday BID and one Sunday morning - said the side effects were intolerable (muscle aches, blurred vision and headaches).  He said it did help his mouth pain but he is unable to continue med.  He is no longer taking any medication.  His mouth has not started hurting again yet but he would like something else to try in case it starts again.

## 2014-04-11 NOTE — Telephone Encounter (Signed)
Notified wife med sent to pharmacy and if fever or nasty phlegm then important to let us know. Wife verbalized understanding.

## 2014-04-12 ENCOUNTER — Telehealth: Payer: Self-pay | Admitting: Neurology

## 2014-04-12 NOTE — Telephone Encounter (Signed)
Patient will pick up samples tomorrow - he will keep his appointment to further discuss at his appointment on 04/15/14.

## 2014-04-12 NOTE — Telephone Encounter (Signed)
Pt's wife is calling stating that pt's copay for Lyrica is $268.  She wants to know if there are any samples the pt can get before picking up this medication to make sure this is something he can take.  Please call and advise.

## 2014-04-14 ENCOUNTER — Ambulatory Visit
Admission: RE | Admit: 2014-04-14 | Discharge: 2014-04-14 | Disposition: A | Discharge status: Home / Self Care | Payer: Medicare HMO | Source: Ambulatory Visit | Attending: Neurology | Admitting: Neurology

## 2014-04-14 ENCOUNTER — Inpatient Hospital Stay: Admission: RE | Admit: 2014-04-14 | Payer: Medicare Other | Source: Ambulatory Visit

## 2014-04-14 DIAGNOSIS — G500 Trigeminal neuralgia: Secondary | ICD-10-CM | POA: Diagnosis not present

## 2014-04-14 MED ORDER — GADOBENATE DIMEGLUMINE 529 MG/ML IV SOLN
20.0000 mL | Freq: Once | INTRAVENOUS | Status: AC | PRN
Start: 2014-04-14 — End: 2014-04-14

## 2014-04-15 ENCOUNTER — Telehealth: Payer: Self-pay | Admitting: Neurology

## 2014-04-15 ENCOUNTER — Telehealth: Payer: Self-pay | Admitting: *Deleted

## 2014-04-15 ENCOUNTER — Ambulatory Visit: Payer: Medicare HMO | Admitting: Neurology

## 2014-04-15 ENCOUNTER — Encounter: Payer: Self-pay | Admitting: Neurology

## 2014-04-15 ENCOUNTER — Ambulatory Visit (INDEPENDENT_AMBULATORY_CARE_PROVIDER_SITE_OTHER): Payer: Medicare HMO | Admitting: Neurology

## 2014-04-15 VITALS — BP 128/82 | HR 62 | Ht 73.5 in | Wt 256.0 lb

## 2014-04-15 DIAGNOSIS — G500 Trigeminal neuralgia: Secondary | ICD-10-CM

## 2014-04-15 MED ORDER — CARBAMAZEPINE ER 100 MG PO TB12: ORAL_TABLET | ORAL | Status: DC

## 2014-04-15 NOTE — Progress Notes (Signed)
PATIENT: Curtis [name] DOB: March 22, 1945  HISTORICAL  Avier Jech is a 30 ambidextrous male, referred by his dentist Dr. Modena Nunnery for evaluation of right jaw pain, he is with his wife,   He had a history of interstitial pulmonary fibrosis since 2012, taking prednisone 10 mg daily, not oxygen dependent, since 2014, he had intermittent right lower jaw pain, but it was tolerable, since January 2016, he had increased frequency, and severity of shooting pain starting from right lower molar region worse his right ear, increased frequency, to the point of difficulty drinking, eating, sometimes paresthesia at right lower lip, occasionally right upper lip, he has difficulty eating, brushing his teeth, triggered by touch, movement,  He denies visual loss, had oral x-ray, there was no significant abnormality found He has to take oxycodone, only with temporary relief  UPDATE April 8th 2016: He has tried Lyrica 100mg  tid, his right facial pain has much improved, but is too much expensive,   He could tolerate gabapentin 300mg  3 tab tid, along with tegretol 100mg  tid, which helped his right facial pain,  But he complains of dizziness, not feeling well, ,    REVIEW OF SYSTEMS: Full 14 system review of systems performed and notable only for hearing loss, trouble swallowing, blurry vision, shortness of breath, cough, wheezing, easy bruising, joints pain, allergy, headaches, difficulty swallowing, insomnia  ALLERGIES: Allergies  Allergen Reactions  . Oxycodone Itching  . Pravastatin Other (See Comments)    Chills    . Simvastatin Other (See Comments)    Chills  Chills   . Prednisone Anxiety    Irritation    HOME MEDICATIONS: Current Outpatient Prescriptions  Medication Sig Dispense Refill  . hydrochlorothiazide (HYDRODIURIL) 25 MG tablet Take 1 tablet (25 mg total) by mouth daily. 30 tablet 6  . HYDROcodone-acetaminophen (NORCO/VICODIN) 5-325 MG per tablet Take 1 tablet by mouth  every 4 (four) hours as needed. 45 tablet 0  . latanoprost (XALATAN) 0.005 % ophthalmic solution     . naproxen sodium (ALEVE) 220 MG tablet Take 440 mg by mouth daily as needed. For pain/relief    . omeprazole (PRILOSEC) 20 MG capsule Take 20 mg by mouth daily as needed. For acid reflux    . PREDNISONE PO Take 10 mg by mouth daily.     Marland Kitchen zolpidem (AMBIEN) 10 MG tablet take 1 tablet by mouth at bedtime if needed 30 tablet 5     PAST MEDICAL HISTORY: Past Medical History  Diagnosis Date  . Hypertension   . Dyslipidemia     Statin intolerant  . Idiopathic pulmonary fibrosis   . Arthritis   . Chest pain     Emergency room October 24, 2011, no MI  //   Nuclear, October, 2013, adenosine, EF 61%, no scar or ischemia  . Psoriasis   . Statin intolerance   . GERD (gastroesophageal reflux disease)   . Asthma   . Left shoulder pain 2011  . Cervical spondylosis   . Interstitial pulmonary fibrosis 2012  . Glaucoma   . Hearing loss     PAST SURGICAL HISTORY: Past Surgical History  Procedure Laterality Date  . Neck surgery      cervical disc with cliip  . Colonoscopy    . Cholecystectomy  05/08/2012  . Cholecystectomy N/A 05/08/2012    Procedure: LAPAROSCOPIC CHOLECYSTECTOMY;  Surgeon: Harl Bowie, MD;  Location: [facility];  Service: General;  Laterality: N/A;  . Liver biopsy N/A 05/08/2012    Procedure: LIVER BIOPSY;  Surgeon: Harl Bowie, MD;  Location: [facility];  Service: General;  Laterality: N/A;  . Cataract extraction w/phaco Right 06/11/2012    Procedure: CATARACT EXTRACTION PHACO AND INTRAOCULAR LENS PLACEMENT ([place]);  Surgeon: Tonny Branch, MD;  Location: [facility];  Service: Ophthalmology;  Laterality: Right;  CDE:  14.43  . Cataract extraction w/phaco Left 06/29/2012    Procedure: CATARACT EXTRACTION PHACO AND INTRAOCULAR LENS PLACEMENT ([facility]);  Surgeon: Tonny Branch, MD;  Location: [facility];  Service: Ophthalmology;  Laterality: Left;  CDE:14.11    FAMILY HISTORY: Family History    Problem Relation Age of Onset  . Cancer Sister     breast  . Hypertension Mother   . Congestive Heart Failure Mother   . COPD Mother   . Stroke Father     SOCIAL HISTORY:  History   Social History  . Marital Status: Married    Spouse Name: N/A  . Number of Children: N/A  . Years of Education: N/A   Occupational History  . Not on file.   Social History Main Topics  . Smoking status: Former Smoker -- 0.50 packs/day for 52 years    Types: Cigarettes    Quit date: 09/08/2003  . Smokeless tobacco: Never Used  . Alcohol Use: No     Comment: quit drinking 2004  . Drug Use: No  . Sexual Activity: Yes    Birth Control/ Protection: None   Other Topics Concern  . Not on file   Social History Narrative     PHYSICAL EXAM   Filed Vitals:   04/15/14 1008  BP: 128/82  Pulse: 62  Height: 6' 1.5" (1.867 m)  Weight: 256 lb (116.121 kg)    Not recorded      Body mass index is 33.31 kg/(m^2).  PHYSICAL EXAMNIATION:  Gen: NAD, conversant, well nourised, obese, well groomed                     Cardiovascular: Regular rate rhythm, no peripheral edema, warm, nontender. Eyes: Conjunctivae clear without exudates or hemorrhage Neck: Supple, no carotid bruise. Pulmonary: Clear to auscultation bilaterally   NEUROLOGICAL EXAM:  MENTAL STATUS: Speech:    Speech is normal; fluent and spontaneous with normal comprehension.  Cognition:    The patient is oriented to person, place, and time;     recent and remote memory intact;     language fluent;     normal attention, concentration,     fund of knowledge.  CRANIAL NERVES: CN II: Visual fields are full to confrontation. Fundoscopic exam is normal with sharp discs and no vascular changes. Venous pulsations are present bilaterally. Pupils are 4 mm and briskly reactive to light. Visual acuity is 20/20 bilaterally. CN III, IV, VI: extraocular movement are normal. No ptosis. CN V: Facial sensation is intact to pinprick in all 3  divisions bilaterally. Corneal responses are intact.  CN VII: Face is symmetric with normal eye closure and smile. CN VIII: Hearing is normal to rubbing fingers CN IX, X: Palate elevates symmetrically. Phonation is normal. He has no right lower molar CN XI: Head turning and shoulder shrug are intact CN XII: Tongue is midline with normal movements and no atrophy.  MOTOR: There is no pronator drift of out-stretched arms. Muscle bulk and tone are normal. Muscle strength is normal.   Shoulder abduction Shoulder external rotation Elbow flexion Elbow extension Wrist flexion Wrist extension Finger abduction Hip flexion Knee flexion Knee extension Ankle dorsi flexion Ankle plantar flexion  R 5 5 5 5 5 5 5 5 5 5 5 5   L 5 5 5 5 5 5 5 5 5 5 5 5     REFLEXES: Reflexes are 2+ and symmetric at the biceps, triceps, knees, and ankles. Plantar responses are flexor.  SENSORY: Light touch, pinprick, position sense, and vibration sense are intact in fingers and toes.  COORDINATION: Rapid alternating movements and fine finger movements are intact. There is no dysmetria on finger-to-nose and heel-knee-shin. There are no abnormal or extraneous movements.   GAIT/STANCE: Posture is normal. Gait is steady with normal steps, base, arm swing, and turning. Heel and toe walking are normal. Tandem gait is normal.  Romberg is absent.   DIAGNOSTIC DATA (LABS, IMAGING, TESTING) - I reviewed patient records, labs, notes, testing and imaging myself where available.  Lab Results  Component Value Date   WBC 10.1 06/25/2013   HGB 16.3 06/25/2013   HCT 47.1 06/25/2013   MCV 89.0 06/25/2013   PLT 131* 06/25/2013      Component Value Date/Time   NA 141 03/01/2014 0940   K 4.3 03/01/2014 0940   CL 102 03/01/2014 0940   CO2 29 03/01/2014 0940   GLUCOSE 84 03/01/2014 0940   BUN 12 03/01/2014 0940   CREATININE 0.95 03/01/2014 0940   CREATININE 1.01 05/09/2012 0404   CALCIUM 9.5 03/01/2014 0940   PROT 7.8  03/01/2014 0940   ALBUMIN 3.9 03/01/2014 0940   AST 25 03/01/2014 0940   ALT 22 03/01/2014 0940   ALKPHOS 67 03/01/2014 0940   BILITOT 1.7* 03/01/2014 0940   GFRNONAA 74* 05/09/2012 0404   GFRAA 86* 05/09/2012 0404   Lab Results  Component Value Date   CHOL 164 03/01/2014   HDL 29* 03/01/2014   LDLCALC 109* 03/01/2014   TRIG 129 03/01/2014   CHOLHDL 5.7 03/01/2014   Lab Results  Component Value Date   HGBA1C 5.8* 03/01/2014    ASSESSMENT AND PLAN  Skylor Schnapp is a 69 y.o. male  with history of pulmonary fibrosis, presenting with intermittent right facial pain, consistent with right trigeminal neuralgia  1. Rx Tegretol 100 mg, may titrating to 2 tablets 3 times a day,  also giving him Lyrica sample 50 mg, try Tegretol first, only taking Lyrica if Tegretol alone does not work,  2. return to clinic in 1  weeks  Marcial Pacas, M.D. Ph.D.  [facility] [address] [place], [place] [address] Ph: [phone] Fax: [phone]

## 2014-04-15 NOTE — Telephone Encounter (Signed)
I have reviewed MRi brain with patient, IMPRESSION: Slightly abnormal MRI scan of the brain showing age-related changes of chronic microvascular ischemia and generalized cerebral atrophy. 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

## 2014-04-15 NOTE — Telephone Encounter (Signed)
Apolonio Schneiders with [facility] @ [phone], questioning second refill request for Rx carbamazepine (TEGRETOL XR) 100 MG 12 hr tablet.  Please call and advise.

## 2014-04-15 NOTE — Telephone Encounter (Signed)
I called the pharmacy back.  Verified Rx.  They will fill as prescribed.

## 2014-04-15 NOTE — Telephone Encounter (Signed)
His copay for Lyrica $268 - he is on a fixed income - could you please check on patient assistance for him?

## 2014-04-18 ENCOUNTER — Encounter: Payer: Self-pay | Admitting: *Deleted

## 2014-04-18 ENCOUNTER — Telehealth: Payer: Self-pay | Admitting: Neurology

## 2014-04-18 NOTE — Telephone Encounter (Signed)
Pt is calling stating he has information for the nurse regarding medication.  Please call and advise.

## 2014-04-18 NOTE — Telephone Encounter (Signed)
He attempted to take Tegretol - said he had adverse reaction - flushed face, itching - he discontinued med.  He has continued his Lyrica 100mg  TID and says his symptoms have been well controlled.

## 2014-04-19 NOTE — Telephone Encounter (Addendum)
Jessica: Please check on Lyrica assistant program for him, he needs 100 mg 3 times a day,  He has  a follow-up visit in April 15, likely he had allergic reaction to Tegretol, documented.

## 2014-04-19 NOTE — Telephone Encounter (Signed)
I called the patient.  Got no answer.  Left message with phone number for [employer] [phone].  Patient will need to contact them and provide personal info (income, number of people in household, out of pocket expenses, etc.).  They will then do a screening to determine if he is eligible for assistance.  Asked that he call us back with any questions.

## 2014-04-19 NOTE — Telephone Encounter (Signed)
Spoke to patient - he has contacted [employer] - said they told him he was eligible and will be sending him a form for completion and income verification.  He currently has samples of Lyrica to take.

## 2014-04-22 ENCOUNTER — Ambulatory Visit (INDEPENDENT_AMBULATORY_CARE_PROVIDER_SITE_OTHER): Payer: Medicare HMO | Admitting: Neurology

## 2014-04-22 ENCOUNTER — Telehealth: Payer: Self-pay | Admitting: *Deleted

## 2014-04-22 ENCOUNTER — Encounter: Payer: Self-pay | Admitting: Neurology

## 2014-04-22 VITALS — BP 124/84 | HR 75 | Ht 73.5 in | Wt 256.0 lb

## 2014-04-22 DIAGNOSIS — G500 Trigeminal neuralgia: Secondary | ICD-10-CM

## 2014-04-22 MED ORDER — BACLOFEN 10 MG PO TABS: 10.0000 mg | ORAL_TABLET | Freq: Three times a day (TID) | ORAL | Status: DC

## 2014-04-22 NOTE — Telephone Encounter (Signed)
Hi Curtis [name], This patient is applying for patient assistance for Lyrica.  His dose was increased at today's visit to 150mg  TID.  We provided him with samples to last a couple of weeks while he is getting his paperwork finalized. Thanks, Sharyn Lull

## 2014-04-22 NOTE — Telephone Encounter (Signed)
Noted! Thank you

## 2014-04-22 NOTE — Progress Notes (Signed)
PATIENT: Curtis [name] DOB: [birth year]  HISTORICAL  Lorris Carducci is a 19 ambidextrous male, referred by his dentist Dr. Modena Nunnery for evaluation of right jaw pain, he is with his wife,   He had a history of interstitial pulmonary fibrosis since 2012, taking prednisone 10 mg daily, not oxygen dependent, since 2014, he had intermittent right lower jaw pain, but it was tolerable, since January 2016, he had increased frequency, and severity of shooting pain starting from right lower molar region worse his right ear, increased frequency, to the point of difficulty drinking, eating, sometimes paresthesia at right lower lip, occasionally right upper lip, he has difficulty eating, brushing his teeth, triggered by touch, movement,  He denies visual loss, had oral x-ray, there was no significant abnormality found He has to take oxycodone, only with temporary relief  UPDATE April 8th 2016: He has tried Lyrica 100mg  tid, his right facial pain has much improved, but is too much expensive,   He could tolerate gabapentin 300mg  3 tab tid, along with tegretol 100mg  tid, which helped his right facial pain,  But he complains of dizziness, not feeling well,   UPDATE April 15th 2016: Tegretol has caused rash, skin flush, he is no longer taking it, on allergy list Lyrica 100mg  seems to help, but sometimes, he still has mild to moderate right facial pain, has to take one tab of hydrocodone for his right facial pain  REVIEW OF SYSTEMS: Full 14 system review of systems performed and notable only for right facial pain  ALLERGIES: Allergies  Allergen Reactions  . Oxycodone Itching  . Pravastatin Other (See Comments)    Chills    . Simvastatin Other (See Comments)    Chills  Chills   . Prednisone Anxiety    Irritation  . Tegretol [Carbamazepine] Rash    HOME MEDICATIONS: Current Outpatient Prescriptions  Medication Sig Dispense Refill  . hydrochlorothiazide (HYDRODIURIL) 25 MG tablet Take  1 tablet (25 mg total) by mouth daily. 30 tablet 6  . HYDROcodone-acetaminophen (NORCO/VICODIN) 5-325 MG per tablet Take 1 tablet by mouth every 4 (four) hours as needed. 45 tablet 0  . latanoprost (XALATAN) 0.005 % ophthalmic solution     . naproxen sodium (ALEVE) 220 MG tablet Take 440 mg by mouth daily as needed. For pain/relief    . omeprazole (PRILOSEC) 20 MG capsule Take 20 mg by mouth daily as needed. For acid reflux    . PREDNISONE PO Take 10 mg by mouth daily.     Marland Kitchen zolpidem (AMBIEN) 10 MG tablet take 1 tablet by mouth at bedtime if needed 30 tablet 5     PAST MEDICAL HISTORY: Past Medical History  Diagnosis Date  . Hypertension   . Dyslipidemia     Statin intolerant  . Idiopathic pulmonary fibrosis   . Arthritis   . Chest pain     Emergency room October 24, 2011, no MI  //   Nuclear, October, 2013, adenosine, EF 61%, no scar or ischemia  . Psoriasis   . Statin intolerance   . GERD (gastroesophageal reflux disease)   . Asthma   . Left shoulder pain 2011  . Cervical spondylosis   . Interstitial pulmonary fibrosis 2012  . Glaucoma   . Hearing loss     PAST SURGICAL HISTORY: Past Surgical History  Procedure Laterality Date  . Neck surgery      cervical disc with cliip  . Colonoscopy    . Cholecystectomy  05/08/2012  . Cholecystectomy N/A 05/08/2012  Procedure: LAPAROSCOPIC CHOLECYSTECTOMY;  Surgeon: Harl Bowie, MD;  Location: [facility];  Service: General;  Laterality: N/A;  . Liver biopsy N/A 05/08/2012    Procedure: LIVER BIOPSY;  Surgeon: Harl Bowie, MD;  Location: [facility];  Service: General;  Laterality: N/A;  . Cataract extraction w/phaco Right 06/11/2012    Procedure: CATARACT EXTRACTION PHACO AND INTRAOCULAR LENS PLACEMENT ([place]);  Surgeon: Tonny Branch, MD;  Location: [facility];  Service: Ophthalmology;  Laterality: Right;  CDE:  14.43  . Cataract extraction w/phaco Left 06/29/2012    Procedure: CATARACT EXTRACTION PHACO AND INTRAOCULAR LENS PLACEMENT ([facility]);   Surgeon: Tonny Branch, MD;  Location: [facility];  Service: Ophthalmology;  Laterality: Left;  CDE:14.11    FAMILY HISTORY: Family History  Problem Relation Age of Onset  . Cancer Sister     breast  . Hypertension Mother   . Congestive Heart Failure Mother   . COPD Mother   . Stroke Father     SOCIAL HISTORY:  History   Social History  . Marital Status: Married    Spouse Name: N/A  . Number of Children: N/A  . Years of Education: N/A   Occupational History  . Not on file.   Social History Main Topics  . Smoking status: Former Smoker -- 0.50 packs/day for 52 years    Types: Cigarettes    Quit date: 09/08/2003  . Smokeless tobacco: Never Used  . Alcohol Use: No     Comment: quit drinking 2004  . Drug Use: No  . Sexual Activity: Yes    Birth Control/ Protection: None   Other Topics Concern  . Not on file   Social History Narrative     PHYSICAL EXAM   Filed Vitals:   04/22/14 0851  Height: 6' 1.5" (1.867 m)  Weight: 256 lb (116.121 kg)    Not recorded      Body mass index is 33.31 kg/(m^2).  PHYSICAL EXAMNIATION:  Gen: NAD, conversant, well nourised, obese, well groomed                     Cardiovascular: Regular rate rhythm, no peripheral edema, warm, nontender. Eyes: Conjunctivae clear without exudates or hemorrhage Neck: Supple, no carotid bruise. Pulmonary: Clear to auscultation bilaterally   NEUROLOGICAL EXAM:  MENTAL STATUS: Speech:    Speech is normal; fluent and spontaneous with normal comprehension.  Cognition:    The patient is oriented to person, place, and time;     recent and remote memory intact;     language fluent;     normal attention, concentration,     fund of knowledge.  CRANIAL NERVES: CN II: Visual fields are full to confrontation. Fundoscopic exam is normal with sharp discs and no vascular changes. Venous pulsations are present bilaterally. Pupils are 4 mm and briskly reactive to light. Visual acuity is 20/20 bilaterally. CN  III, IV, VI: extraocular movement are normal. No ptosis. CN V: Facial sensation is intact to pinprick in all 3 divisions bilaterally. Corneal responses are intact.  CN VII: Face is symmetric with normal eye closure and smile. CN VIII: Hearing is normal to rubbing fingers CN IX, X: Palate elevates symmetrically. Phonation is normal. He has no right lower molar CN XI: Head turning and shoulder shrug are intact CN XII: Tongue is midline with normal movements and no atrophy.  MOTOR: There is no pronator drift of out-stretched arms. Muscle bulk and tone are normal. Muscle strength is normal.   Shoulder abduction  Shoulder external rotation Elbow flexion Elbow extension Wrist flexion Wrist extension Finger abduction Hip flexion Knee flexion Knee extension Ankle dorsi flexion Ankle plantar flexion  R 5 5 5 5 5 5 5 5 5 5 5 5   L 5 5 5 5 5 5 5 5 5 5 5 5     REFLEXES: Reflexes are 2+ and symmetric at the biceps, triceps, knees, and ankles. Plantar responses are flexor.  SENSORY: Light touch, pinprick, position sense, and vibration sense are intact in fingers and toes.  COORDINATION: Rapid alternating movements and fine finger movements are intact. There is no dysmetria on finger-to-nose and heel-knee-shin. There are no abnormal or extraneous movements.   GAIT/STANCE: Posture is normal. Gait is steady with normal steps, base, arm swing, and turning. Heel and toe walking are normal. Tandem gait is normal.  Romberg is absent.   DIAGNOSTIC DATA (LABS, IMAGING, TESTING) - I reviewed patient records, labs, notes, testing and imaging myself where available.  Lab Results  Component Value Date   WBC 10.1 06/25/2013   HGB 16.3 06/25/2013   HCT 47.1 06/25/2013   MCV 89.0 06/25/2013   PLT 131* 06/25/2013      Component Value Date/Time   NA 141 03/01/2014 0940   K 4.3 03/01/2014 0940   CL 102 03/01/2014 0940   CO2 29 03/01/2014 0940   GLUCOSE 84 03/01/2014 0940   BUN 12 03/01/2014 0940    CREATININE 0.95 03/01/2014 0940   CREATININE 1.01 05/09/2012 0404   CALCIUM 9.5 03/01/2014 0940   PROT 7.8 03/01/2014 0940   ALBUMIN 3.9 03/01/2014 0940   AST 25 03/01/2014 0940   ALT 22 03/01/2014 0940   ALKPHOS 67 03/01/2014 0940   BILITOT 1.7* 03/01/2014 0940   GFRNONAA 74* 05/09/2012 0404   GFRAA 86* 05/09/2012 0404   Lab Results  Component Value Date   CHOL 164 03/01/2014   HDL 29* 03/01/2014   LDLCALC 109* 03/01/2014   TRIG 129 03/01/2014   CHOLHDL 5.7 03/01/2014   Lab Results  Component Value Date   HGBA1C 5.8* 03/01/2014    ASSESSMENT AND PLAN  Alver Leete is a 69 y.o. male  with history of pulmonary fibrosis, presenting with intermittent right facial pain, consistent with right trigeminal neuralgia, gabapentin up to 2700 mg a day fail to improve his symptoms, he developed allergic reaction to Tegretol,  1. He responded well to Lyrica, titrating dosage to 150 mg 3 times a day 2, he will get Lyrica medical assistant program also give him baclofen prescription 10 mg 3 times a day 3. Return to clinic in 2 months  Leanord Hawking, M.D. Ph.D.  [facility] [address], [place], [place] [address] Ph: [phone] Fax: [phone]

## 2014-05-13 ENCOUNTER — Other Ambulatory Visit: Payer: Self-pay | Admitting: Neurology

## 2014-05-17 NOTE — Telephone Encounter (Signed)
I called P[employer] to follow up on patient's enrollment.  Spoke with [employer].  She said it is still being processed and asked that we allow another 7-10 days for this to be completed.

## 2014-05-18 ENCOUNTER — Telehealth: Payer: Self-pay | Admitting: *Deleted

## 2014-05-18 ENCOUNTER — Ambulatory Visit: Payer: Medicare HMO | Admitting: Neurology

## 2014-05-18 NOTE — Telephone Encounter (Signed)
Patient called the morning of his appt to cancel.

## 2014-05-20 NOTE — Telephone Encounter (Signed)
I called Patient [employer] to follow up on enrollment.  Spoke with [employer].  She verified the patient's application has been approved, and the medication is scheduled to ship next week.  They have notified the patient of this decision.  Approval is valid until 01/07/2015.

## 2014-05-30 ENCOUNTER — Telehealth: Payer: Self-pay | Admitting: Neurology

## 2014-05-30 NOTE — Telephone Encounter (Signed)
Patient's wife Bethena Roys is calling in regard to Lyrica 100 mg.  She states medicine makes him feel terrible, sleepy, groggy, and is not taking care of his pain.  Please call.

## 2014-05-30 NOTE — Telephone Encounter (Signed)
Spoke to patient's wife - Curtis [name] is still having pain despite taking Lyrica 100mg , TID.  In addition to the pain, the medication is still making him feel groggy and is having difficulty working his job in [employer].  He would like to taper off Lyrica and he also wanted to know if there is an alternate medication to try.

## 2014-05-31 NOTE — Telephone Encounter (Signed)
Curtis [name]: chart reviewed, he responded to lyrica well in the past, has tried and failed neurontin, trileptal, tegretol.  All the neuropathic pain medications have similar side effect of dizziness, fatigue,   If he continues to have pain, may give him an earlier follow up visit, may consider other treatment options, such as gamma knife at [facility].

## 2014-05-31 NOTE — Telephone Encounter (Signed)
Spoke to patient - he stopped taking Lyrica and Baclofen last Friday (05/27/14).  Reports the medications had stopped helping his pain and was making him drowsy.  Says his pain is as severe as when he was diagnosed.  He is agreeable to an earlier follow up appt and has been scheduled.

## 2014-06-01 ENCOUNTER — Ambulatory Visit (INDEPENDENT_AMBULATORY_CARE_PROVIDER_SITE_OTHER): Payer: Medicare HMO | Admitting: Neurology

## 2014-06-01 ENCOUNTER — Encounter: Payer: Self-pay | Admitting: Neurology

## 2014-06-01 VITALS — BP 149/102 | HR 77 | Ht 73.5 in | Wt 256.0 lb

## 2014-06-01 DIAGNOSIS — G500 Trigeminal neuralgia: Secondary | ICD-10-CM | POA: Diagnosis not present

## 2014-06-01 MED ORDER — OXCARBAZEPINE 150 MG PO TABS: 150.0000 mg | ORAL_TABLET | Freq: Two times a day (BID) | ORAL | Status: DC

## 2014-06-01 NOTE — Progress Notes (Signed)
Chief Complaint  Patient presents with  . Trigeminal Neuralgia    Reports pain has returned to the same severity as when he was first diagnosed.  He has discontinued use of both baclofen and Lyrica because they were not helpful.  He is here to discuss other treatment options.      PATIENT: Curtis [name] DOB: Jun 02, 1945  HISTORICAL  Curtis [name] is a 7 ambidextrous male, referred by his dentist Dr. Modena Nunnery for evaluation of right jaw pain, he is with his wife,   He had a history of interstitial pulmonary fibrosis since 2012, taking prednisone 10 mg daily, not oxygen dependent, since 2014, he had intermittent right lower jaw pain, but it was tolerable, since January 2016, he had increased frequency, and severity of shooting pain starting from right lower molar region worse his right ear, increased frequency, to the point of difficulty drinking, eating, sometimes paresthesia at right lower lip, occasionally right upper lip, he has difficulty eating, brushing his teeth, triggered by touch, movement,  He denies visual loss, had oral x-ray, there was no significant abnormality found He has to take oxycodone, only with temporary relief  UPDATE April 8th 2016: He has tried Lyrica 100mg  tid, his right facial pain has much improved, but is too much expensive,   He could not tolerate gabapentin 300mg  3 tab tid, along with tegretol 100mg  tid, which helped his right facial pain,  But he complains of dizziness, not feeling well,  There was a concern of possible allergic reaction to tegretol, he had a lot of sun exposure related skin rash, worsening skin itching while taking Tegretol, but he denies new rash developed his chest region  UPDATE April 15th 2016: Lyrica 100mg  3 times a day seems to help, but sometimes, he still has mild to moderate right facial pain, has to take one tab of hydrocodone for his right facial pain, office help him to get Lyrica assistant program 150 mg 3 times a  day  UPDATE May 25 th 2016: Lyrica 150 mg 3 times a day did help him some initially, but the benefit seems to wearing off over the past couple weeks, he also does not like the side effect of blurry vision, dizziness, skin sensitive sensation.  He has quit Lyrica since May 27 2014, now with intermittent but throughout the day right lower jaw radiating pain, pain-free time is less than 50% of day,  He has a lot of bilateral upper extremity and facial rashes, related to previous sun exposure, he works as a Games developer  REVIEW OF SYSTEMS: Full 14 system review of systems performed and notable only for right facial pain  ALLERGIES: Allergies  Allergen Reactions  . Oxycodone Itching  . Pravastatin Other (See Comments)    Chills    . Simvastatin Other (See Comments)    Chills  Chills   . Prednisone Anxiety    Irritation  . Tegretol [Carbamazepine] Rash    HOME MEDICATIONS: Current Outpatient Prescriptions  Medication Sig Dispense Refill  . hydrochlorothiazide (HYDRODIURIL) 25 MG tablet Take 1 tablet (25 mg total) by mouth daily. 30 tablet 6  . HYDROcodone-acetaminophen (NORCO/VICODIN) 5-325 MG per tablet Take 1 tablet by mouth every 4 (four) hours as needed. 45 tablet 0  . latanoprost (XALATAN) 0.005 % ophthalmic solution     . naproxen sodium (ALEVE) 220 MG tablet Take 440 mg by mouth daily as needed. For pain/relief    . omeprazole (PRILOSEC) 20 MG capsule Take 20 mg by mouth daily as needed.  For acid reflux    . PREDNISONE PO Take 10 mg by mouth daily.     Marland Kitchen zolpidem (AMBIEN) 10 MG tablet take 1 tablet by mouth at bedtime if needed 30 tablet 5     PAST MEDICAL HISTORY: Past Medical History  Diagnosis Date  . Hypertension   . Dyslipidemia     Statin intolerant  . Idiopathic pulmonary fibrosis   . Arthritis   . Chest pain     Emergency room October 24, 2011, no MI  //   Nuclear, October, 2013, adenosine, EF 61%, no scar or ischemia  . Psoriasis   . Statin intolerance   .  GERD (gastroesophageal reflux disease)   . Asthma   . Left shoulder pain 2011  . Cervical spondylosis   . Interstitial pulmonary fibrosis 2012  . Glaucoma   . Hearing loss     PAST SURGICAL HISTORY: Past Surgical History  Procedure Laterality Date  . Neck surgery      cervical disc with cliip  . Colonoscopy    . Cholecystectomy  05/08/2012  . Cholecystectomy N/A 05/08/2012    Procedure: LAPAROSCOPIC CHOLECYSTECTOMY;  Surgeon: Harl Bowie, MD;  Location: [facility];  Service: General;  Laterality: N/A;  . Liver biopsy N/A 05/08/2012    Procedure: LIVER BIOPSY;  Surgeon: Harl Bowie, MD;  Location: [facility];  Service: General;  Laterality: N/A;  . Cataract extraction w/phaco Right 06/11/2012    Procedure: CATARACT EXTRACTION PHACO AND INTRAOCULAR LENS PLACEMENT ([place]);  Surgeon: Tonny Branch, MD;  Location: [facility];  Service: Ophthalmology;  Laterality: Right;  CDE:  14.43  . Cataract extraction w/phaco Left 06/29/2012    Procedure: CATARACT EXTRACTION PHACO AND INTRAOCULAR LENS PLACEMENT ([facility]);  Surgeon: Tonny Branch, MD;  Location: [facility];  Service: Ophthalmology;  Laterality: Left;  CDE:14.11    FAMILY HISTORY: Family History  Problem Relation Age of Onset  . Cancer Sister     breast  . Hypertension Mother   . Congestive Heart Failure Mother   . COPD Mother   . Stroke Father     SOCIAL HISTORY:  History   Social History  . Marital Status: Married    Spouse Name: N/A  . Number of Children: N/A  . Years of Education: N/A   Occupational History  . Not on file.   Social History Main Topics  . Smoking status: Former Smoker -- 0.50 packs/day for 52 years    Types: Cigarettes    Quit date: 09/08/2003  . Smokeless tobacco: Never Used  . Alcohol Use: No     Comment: quit drinking 2004  . Drug Use: No  . Sexual Activity: Yes    Birth Control/ Protection: None   Other Topics Concern  . Not on file   Social History Narrative     PHYSICAL EXAM   Filed Vitals:    06/01/14 0852  BP: 149/102  Pulse: 77  Height: 6' 1.5" (1.867 m)  Weight: 256 lb (116.121 kg)    Not recorded      Body mass index is 33.31 kg/(m^2).  PHYSICAL EXAMNIATION:  Gen: NAD, conversant, well nourised, obese, well groomed                     Cardiovascular: Regular rate rhythm, no peripheral edema, warm, nontender. Eyes: Conjunctivae clear without exudates or hemorrhage Neck: Supple, no carotid bruise. Pulmonary: Clear to auscultation bilaterally  Skin: bilateral arm erythematous skin rash  NEUROLOGICAL EXAM:  MENTAL  STATUS: Speech:    Speech is normal; fluent and spontaneous with normal comprehension.  Cognition:    The patient is oriented to person, place, and time;     recent and remote memory intact;     language fluent;     normal attention, concentration,     fund of knowledge.  CRANIAL NERVES: CN II: Visual fields are full to confrontation. Fundoscopic exam is normal with sharp discs and no vascular changes. Venous pulsations are present bilaterally. Pupils are 4 mm and briskly reactive to light. Visual acuity is 20/20 bilaterally. CN III, IV, VI: extraocular movement are normal. No ptosis. CN V: Facial sensation is intact to pinprick in all 3 divisions bilaterally. Corneal responses are intact.  CN VII: Face is symmetric with normal eye closure and smile. CN VIII: Hearing is normal to rubbing fingers CN IX, X: Palate elevates symmetrically. Phonation is normal. He has no right lower molar CN XI: Head turning and shoulder shrug are intact CN XII: Tongue is midline with normal movements and no atrophy.  MOTOR: There is no pronator drift of out-stretched arms. Muscle bulk and tone are normal. Muscle strength is normal. REFLEXES: Reflexes are 2+ and symmetric at the biceps, triceps, knees, and ankles. Plantar responses are flexor.  SENSORY: Light touch, pinprick, position sense, and vibration sense are intact in fingers and toes.  COORDINATION: Rapid  alternating movements and fine finger movements are intact. There is no dysmetria on finger-to-nose and heel-knee-shin. There are no abnormal or extraneous movements.   GAIT/STANCE: Posture is normal. Gait is steady with normal steps, base, arm swing, and turning. Heel and toe walking are normal. Tandem gait is normal.  Romberg is absent.   DIAGNOSTIC DATA (LABS, IMAGING, TESTING) - I reviewed patient records, labs, notes, testing and imaging myself where available.  Lab Results  Component Value Date   WBC 10.1 06/25/2013   HGB 16.3 06/25/2013   HCT 47.1 06/25/2013   MCV 89.0 06/25/2013   PLT 131* 06/25/2013      Component Value Date/Time   NA 141 03/01/2014 0940   K 4.3 03/01/2014 0940   CL 102 03/01/2014 0940   CO2 29 03/01/2014 0940   GLUCOSE 84 03/01/2014 0940   BUN 12 03/01/2014 0940   CREATININE 0.95 03/01/2014 0940   CREATININE 1.01 05/09/2012 0404   CALCIUM 9.5 03/01/2014 0940   PROT 7.8 03/01/2014 0940   ALBUMIN 3.9 03/01/2014 0940   AST 25 03/01/2014 0940   ALT 22 03/01/2014 0940   ALKPHOS 67 03/01/2014 0940   BILITOT 1.7* 03/01/2014 0940   GFRNONAA 74* 05/09/2012 0404   GFRAA 86* 05/09/2012 0404   Lab Results  Component Value Date   CHOL 164 03/01/2014   HDL 29* 03/01/2014   LDLCALC 109* 03/01/2014   TRIG 129 03/01/2014   CHOLHDL 5.7 03/01/2014   Lab Results  Component Value Date   HGBA1C 5.8* 03/01/2014    ASSESSMENT AND PLAN  Curtis [name] is a 69 y.o. male  with history of pulmonary fibrosis, presenting with intermittent right facial pain, consistent with right trigeminal neuralgia, gabapentin up to 2700 mg a day fail to improve his symptoms, could not tolerate side effect of Tegretol,  1. He responded well to Lyrica 150 mg 3 times a day in the past, but now become less  effective, also complains of dizziness, feeling fatigue, 2, we will try Trileptal 150 mg twice a day, potential side effect, including rash was explained to him, he had a  lot of limb skin rash, related to sun exposure, I have advised him to observe anterior chest for possible skin allergic reaction 3, referred to [facility] neurosurgeon Dr. Cleophas Dunker for [facility] knife evaluation  4. RTC in one month  Leanord Hawking, M.D. Ph.D.  [facility] [address] [place], [place] [address] Ph: [phone] Fax: [phone]

## 2014-06-07 ENCOUNTER — Encounter: Payer: Medicare HMO | Admitting: Family Medicine

## 2014-06-13 ENCOUNTER — Telehealth: Payer: Self-pay | Admitting: *Deleted

## 2014-06-13 NOTE — Telephone Encounter (Signed)
Referral placed by Dr. Krista Blue on 06/01/14 - all records faxed to Dr. Marygrace Drought office for review on 06/03/14.  I called Dr. Marygrace Drought office [phone]) and spoke to [place] - the referral was received and she will try to expedite the appointment process.  Spoke to [place] (pt's wife) to let her know the status.

## 2014-06-13 NOTE — Telephone Encounter (Signed)
I also provided Bethena Roys with Dr. Marygrace Drought office information.

## 2014-06-14 NOTE — Telephone Encounter (Signed)
Spoke to [place] - patient has an appointment scheduled on 07/05/14 with Dr. Salomon Fick.

## 2014-06-14 NOTE — Telephone Encounter (Signed)
Curtis [name] with Dr Marygrace Drought office returned Curtis [name]'s call. She can be reached at [phone].

## 2014-06-20 ENCOUNTER — Other Ambulatory Visit: Payer: Self-pay | Admitting: Neurology

## 2014-06-20 NOTE — Telephone Encounter (Signed)
Last OV note says: He has discontinued use of both baclofen and Lyrica because they were not helpful

## 2014-07-05 DIAGNOSIS — G500 Trigeminal neuralgia: Secondary | ICD-10-CM | POA: Insufficient documentation

## 2014-07-07 ENCOUNTER — Ambulatory Visit: Payer: Medicare HMO | Admitting: Neurology

## 2014-07-19 ENCOUNTER — Other Ambulatory Visit: Payer: Self-pay | Admitting: Family Medicine

## 2014-08-18 DIAGNOSIS — E785 Hyperlipidemia, unspecified: Secondary | ICD-10-CM | POA: Insufficient documentation

## 2014-08-18 DIAGNOSIS — I10 Essential (primary) hypertension: Secondary | ICD-10-CM | POA: Insufficient documentation

## 2014-08-18 DIAGNOSIS — K219 Gastro-esophageal reflux disease without esophagitis: Secondary | ICD-10-CM | POA: Insufficient documentation

## 2014-09-14 ENCOUNTER — Other Ambulatory Visit: Payer: Self-pay | Admitting: Family Medicine

## 2014-09-19 ENCOUNTER — Telehealth: Payer: Self-pay | Admitting: Family Medicine

## 2014-09-19 MED ORDER — HYDROCODONE-ACETAMINOPHEN 5-325 MG PO TABS: 1.0000 | ORAL_TABLET | ORAL | Status: DC | PRN

## 2014-09-19 NOTE — Telephone Encounter (Signed)
Error

## 2014-09-19 NOTE — Telephone Encounter (Signed)
May refill med once, if needs further then will need ov

## 2014-09-19 NOTE — Telephone Encounter (Signed)
Rx up front for patient pick up. Patient notified. 

## 2014-09-19 NOTE — Telephone Encounter (Signed)
HYDROcodone-acetaminophen (NORCO/VICODIN) 5-325 MG per tablet  Pt talked with Dr Zorita Pang, he agreed to write the patient a new refill  For his sore tooth. He was told to call here to remind him to write the script  Please do it as soon as possible, he only has one left.

## 2014-10-03 ENCOUNTER — Other Ambulatory Visit: Payer: Self-pay | Admitting: Neurology

## 2014-10-17 ENCOUNTER — Ambulatory Visit (INDEPENDENT_AMBULATORY_CARE_PROVIDER_SITE_OTHER): Payer: Medicare HMO | Admitting: Family Medicine

## 2014-10-17 ENCOUNTER — Encounter: Payer: Self-pay | Admitting: Family Medicine

## 2014-10-17 VITALS — BP 128/84 | Ht 73.25 in | Wt 262.0 lb

## 2014-10-17 DIAGNOSIS — R739 Hyperglycemia, unspecified: Secondary | ICD-10-CM | POA: Diagnosis not present

## 2014-10-17 DIAGNOSIS — G500 Trigeminal neuralgia: Secondary | ICD-10-CM

## 2014-10-17 DIAGNOSIS — I10 Essential (primary) hypertension: Secondary | ICD-10-CM

## 2014-10-17 DIAGNOSIS — Z23 Encounter for immunization: Secondary | ICD-10-CM

## 2014-10-17 DIAGNOSIS — G4700 Insomnia, unspecified: Secondary | ICD-10-CM

## 2014-10-17 LAB — POCT GLYCOSYLATED HEMOGLOBIN (HGB A1C): Hemoglobin A1C: 5.4

## 2014-10-17 MED ORDER — HYDROCODONE-ACETAMINOPHEN 5-325 MG PO TABS: 1.0000 | ORAL_TABLET | Freq: Four times a day (QID) | ORAL | Status: DC | PRN

## 2014-10-17 MED ORDER — HYDROCHLOROTHIAZIDE 25 MG PO TABS: 25.0000 mg | ORAL_TABLET | Freq: Every day | ORAL | Status: DC

## 2014-10-17 MED ORDER — ZOLPIDEM TARTRATE 5 MG PO TABS: ORAL_TABLET | ORAL | Status: DC

## 2014-10-17 NOTE — Progress Notes (Signed)
   Subjective:    Patient ID: Curtis [name], male    DOB: [birth year], 69 y.o.   MRN: [id number]  Hypertension This is a chronic problem. The current episode started more than 1 year ago. Pertinent negatives include no chest pain.   Having right jaw pain. Had surgery for this problem in august. Still no improvement. Taking tylenol, aleve and hydrocodone. Needs refill on hydrocodone. Takes one every 2 -3 days.   Needs refill on ambien. Takes one half tablet prn. Does not take every night.   Trouble with constipation since having gallbladder removed. Tried dulcolax and milk of magnesia.  Prediabetes. A1C today 5.4.  Flu vaccine today.    Review of Systems  Constitutional: Negative for activity change, appetite change and fatigue.  HENT: Negative for congestion.   Respiratory: Negative for cough.   Cardiovascular: Negative for chest pain.  Gastrointestinal: Negative for abdominal pain.  Endocrine: Negative for polydipsia and polyphagia.  Neurological: Negative for weakness.  Psychiatric/Behavioral: Negative for confusion.       Objective:   Physical Exam  Constitutional: He appears well-nourished. No distress.  Cardiovascular: Normal rate, regular rhythm and normal heart sounds.   No murmur heard. Pulmonary/Chest: Effort normal and breath sounds normal. No respiratory distress.  Musculoskeletal: He exhibits no edema.  Lymphadenopathy:    He has no cervical adenopathy.  Neurological: He is alert.  Psychiatric: His behavior is normal.  Vitals reviewed.   Patient has seen specialist for trigeminal neuralgia even tried gamma knife without success he uses hydrocodone sparingly for pain  Patient does try to watch his diet with his hyperglycemia.      Assessment & Plan:  Chronic back pain and arthritis/also chronic right trigeminal neuralgia-uses hydrocodone sparingly no more than 2 in a day. pain medication prescriptions were given when the patient gets to the end of the  his prescription he needs to follow-up patient was told that we can only do enough for 3 months at a time. Patient was told that pain medications can cause constipation is important for him to take MiraLAX half capful dailypatient up-to-date on colonoscopy he is to let us know if ongoing issues  Insomnia Ambien 5 mg daily at bedtime denies problems with it  Interstitial lung disease follows with specialist  HTN lab work in the past with good patient tolerating medicine well  Reflux under good control with current medication  hyperglycemia-A1c looks good watch diet closely  Greater than 25 minutes spent with patient discussing multiple issues including his hyperglycemia blood pressure chronic back pain and right trigeminal neuralgia

## 2014-10-17 NOTE — Patient Instructions (Signed)

## 2014-10-18 LAB — BASIC METABOLIC PANEL
BUN/Creatinine Ratio: 13 (ref 10–22)
BUN: 12 mg/dL (ref 8–27)
CO2: 27 mmol/L (ref 18–29)
CREATININE: 0.94 mg/dL (ref 0.76–1.27)
Calcium: 9.6 mg/dL (ref 8.6–10.2)
Chloride: 100 mmol/L (ref 97–108)
GFR calc Af Amer: 94 mL/min/{1.73_m2} (ref >59)
GFR calc non Af Amer: 81 mL/min/{1.73_m2} (ref >59)
GLUCOSE: 83 mg/dL (ref 65–99)
POTASSIUM: 4.8 mmol/L (ref 3.5–5.2)
Sodium: 140 mmol/L (ref 134–144)

## 2014-11-14 ENCOUNTER — Ambulatory Visit: Payer: Medicare HMO | Admitting: Family Medicine

## 2014-11-16 ENCOUNTER — Encounter: Payer: Self-pay | Admitting: Family Medicine

## 2014-11-16 ENCOUNTER — Ambulatory Visit (INDEPENDENT_AMBULATORY_CARE_PROVIDER_SITE_OTHER): Payer: Medicare HMO | Admitting: Family Medicine

## 2014-11-16 VITALS — BP 122/80 | Ht 73.25 in | Wt 258.0 lb

## 2014-11-16 DIAGNOSIS — M791 Myalgia: Secondary | ICD-10-CM | POA: Diagnosis not present

## 2014-11-16 DIAGNOSIS — M609 Myositis, unspecified: Secondary | ICD-10-CM | POA: Diagnosis not present

## 2014-11-16 DIAGNOSIS — IMO0001 Reserved for inherently not codable concepts without codable children: Secondary | ICD-10-CM

## 2014-11-16 DIAGNOSIS — M25551 Pain in right hip: Secondary | ICD-10-CM

## 2014-11-16 MED ORDER — PREDNISONE 20 MG PO TABS: ORAL_TABLET | ORAL | Status: DC

## 2014-11-16 NOTE — Progress Notes (Signed)
   Subjective:    Patient ID: Curtis [name], male    DOB: 05-30-1945, 69 y.o.   MRN: [id number]  Back Pain This is a new problem. Episode onset: 2 weeks ago. The pain is present in the lumbar spine (both legs). The pain radiates to the left thigh and right thigh. The pain is worse during the day. The symptoms are aggravated by standing (walking). Stiffness is present all day. He has tried heat (hydrocodone ) for the symptoms.    patient states this pain discomfort and right hip and into the legs started over the past 10 days worse over the past few days with sitting walking moving. Denies any numbness or tingling. Denies loss of bowel or bladder control.   Review of Systems  Musculoskeletal: Positive for back pain.   patient with back pain right hip pain pain radiates into both thighs worse with sitting     Objective:   Physical Exam  low back subjective discomfort negative straight leg raise increased pain in the right hip region with internal and external rotation strengthen the lower legs very good reflexes very brisk 3+.      Assessment & Plan:   patient overall I believe has inflammation in the muscles causing this it is possible could have a hip arthritis also possible could be spinal stenosis if symptoms persist MRI of the lower back referral probable next steps he may use hydrocodone as needed for pain we will do a prednisone taper over the next 12 days then resume his normal prednisone check a sedimentation rate patient is given [place] update the next couple weeks how he is doing

## 2014-11-17 LAB — SEDIMENTATION RATE: Sed Rate: 3 mm/hr (ref 0–30)

## 2014-12-09 ENCOUNTER — Other Ambulatory Visit: Payer: Self-pay | Admitting: Family Medicine

## 2015-01-13 ENCOUNTER — Telehealth: Payer: Self-pay | Admitting: Family Medicine

## 2015-01-13 MED ORDER — HYDROCODONE-ACETAMINOPHEN 5-325 MG PO TABS: 1.0000 | ORAL_TABLET | Freq: Four times a day (QID) | ORAL | Status: DC | PRN

## 2015-01-13 NOTE — Telephone Encounter (Signed)
Pt states he needs another refill on the hydrocodone He is going back to [facility] mid February for this tooth to be Fixed if he can get one more refill please

## 2015-01-13 NOTE — Telephone Encounter (Signed)
Notified patient script ready for pick up. 

## 2015-01-13 NOTE — Telephone Encounter (Signed)
Ok times one 

## 2015-02-09 ENCOUNTER — Ambulatory Visit (INDEPENDENT_AMBULATORY_CARE_PROVIDER_SITE_OTHER): Payer: Medicare HMO | Admitting: Family Medicine

## 2015-02-09 ENCOUNTER — Encounter: Payer: Self-pay | Admitting: Family Medicine

## 2015-02-09 VITALS — BP 132/80 | Temp 99.6°F | Ht 73.25 in | Wt 258.0 lb

## 2015-02-09 DIAGNOSIS — G8929 Other chronic pain: Secondary | ICD-10-CM | POA: Diagnosis not present

## 2015-02-09 DIAGNOSIS — G500 Trigeminal neuralgia: Secondary | ICD-10-CM

## 2015-02-09 DIAGNOSIS — J8410 Pulmonary fibrosis, unspecified: Secondary | ICD-10-CM

## 2015-02-09 DIAGNOSIS — M549 Dorsalgia, unspecified: Secondary | ICD-10-CM | POA: Diagnosis not present

## 2015-02-09 DIAGNOSIS — J019 Acute sinusitis, unspecified: Secondary | ICD-10-CM

## 2015-02-09 MED ORDER — AMOXICILLIN-POT CLAVULANATE 875-125 MG PO TABS: 1.0000 | ORAL_TABLET | Freq: Two times a day (BID) | ORAL | Status: DC

## 2015-02-09 MED ORDER — HYDROCODONE-ACETAMINOPHEN 5-325 MG PO TABS: 1.0000 | ORAL_TABLET | Freq: Four times a day (QID) | ORAL | Status: DC | PRN

## 2015-02-09 MED ORDER — BENZONATATE 100 MG PO CAPS: 100.0000 mg | ORAL_CAPSULE | Freq: Three times a day (TID) | ORAL | Status: DC | PRN

## 2015-02-09 MED ORDER — PREDNISONE 20 MG PO TABS: ORAL_TABLET | ORAL | Status: DC

## 2015-02-09 NOTE — Progress Notes (Signed)
   Subjective:    Patient ID: Curtis [name], male    DOB: 1945-06-29, 70 y.o.   MRN: [id number]  Cough This is a new problem. Episode onset: 7 - 10 days. Associated symptoms include chills, headaches, myalgias, nasal congestion, shortness of breath and wheezing. Treatments tried: advil, nyquil.   Patient relates increased shortness breath coughing Patient head congestion drainage coughing sinus pressure  Review of Systems  Constitutional: Positive for chills.  Respiratory: Positive for cough, shortness of breath and wheezing.   Musculoskeletal: Positive for myalgias.  Neurological: Positive for headaches.       Objective:   Physical Exam  Lungs not in respiratory distress does have crackles some wheezing pulmonary issue seems to be a little bit worse then usual consistent with his underlying chronic pulmonary issue hearts regular  Sinus nontender    Assessment & Plan:  Upper respiratory illness Secondary pulmonary infection Antibiotics prescribed warning signs discussed Chronic back pain and jaw pain patient takes anywhere between 1 and 4 tablets per day 3 prescriptions given he denies abusing it he will follow-up when these prescriptions are running out Patient is aware of the potential for drowsiness and he is aware not to overuse the medicine to minimize risk of addiction

## 2015-03-03 ENCOUNTER — Other Ambulatory Visit: Payer: Self-pay | Admitting: *Deleted

## 2015-03-03 ENCOUNTER — Telehealth: Payer: Self-pay | Admitting: Family Medicine

## 2015-03-03 MED ORDER — BENZONATATE 100 MG PO CAPS: 100.0000 mg | ORAL_CAPSULE | Freq: Three times a day (TID) | ORAL | Status: DC | PRN

## 2015-03-03 MED ORDER — CEFPROZIL 500 MG PO TABS: 500.0000 mg | ORAL_TABLET | Freq: Two times a day (BID) | ORAL | Status: DC

## 2015-03-03 NOTE — Telephone Encounter (Signed)
Med sent to pharm. Pt notified.  

## 2015-03-03 NOTE — Telephone Encounter (Signed)
Ok per dr Nicki Reaper. Med sent to pharm.

## 2015-03-03 NOTE — Telephone Encounter (Signed)
Cefzil 500 mg 1 twice a day 10 days follow-up if ongoing troubles 

## 2015-03-03 NOTE — Telephone Encounter (Signed)
Patient was prescribed an antibiotic on 02/09/15 for rhinosinusitis.  The antibiotic cleared it up for the most part, but he started coughing again over the last couple of days, coughing up yellow stuff.  Please advise.   [facility]

## 2015-03-03 NOTE — Telephone Encounter (Signed)
Seen 2/2 prescribed augmentin. Pt states he got better but now symptoms have come back in the past couple of days. Coughing up yellow mucus, wheezing, no fever

## 2015-03-03 NOTE — Telephone Encounter (Signed)
Pt wants to know if he can have refill on tessalon. Does not need a call back if you approve

## 2015-05-03 ENCOUNTER — Other Ambulatory Visit: Payer: Self-pay | Admitting: Family Medicine

## 2015-05-03 NOTE — Telephone Encounter (Signed)
Ok six mo 

## 2015-05-03 NOTE — Telephone Encounter (Signed)
May have this and 5 rf 

## 2015-06-08 ENCOUNTER — Ambulatory Visit (INDEPENDENT_AMBULATORY_CARE_PROVIDER_SITE_OTHER): Payer: Medicare HMO | Admitting: Family Medicine

## 2015-06-08 ENCOUNTER — Encounter: Payer: Self-pay | Admitting: Family Medicine

## 2015-06-08 VITALS — BP 134/80 | Ht 73.5 in | Wt 265.1 lb

## 2015-06-08 DIAGNOSIS — R739 Hyperglycemia, unspecified: Secondary | ICD-10-CM

## 2015-06-08 DIAGNOSIS — G8929 Other chronic pain: Secondary | ICD-10-CM

## 2015-06-08 DIAGNOSIS — D696 Thrombocytopenia, unspecified: Secondary | ICD-10-CM | POA: Diagnosis not present

## 2015-06-08 DIAGNOSIS — I10 Essential (primary) hypertension: Secondary | ICD-10-CM | POA: Diagnosis not present

## 2015-06-08 DIAGNOSIS — G4700 Insomnia, unspecified: Secondary | ICD-10-CM | POA: Insufficient documentation

## 2015-06-08 DIAGNOSIS — M542 Cervicalgia: Secondary | ICD-10-CM

## 2015-06-08 DIAGNOSIS — Z125 Encounter for screening for malignant neoplasm of prostate: Secondary | ICD-10-CM | POA: Diagnosis not present

## 2015-06-08 DIAGNOSIS — Z0000 Encounter for general adult medical examination without abnormal findings: Secondary | ICD-10-CM | POA: Diagnosis not present

## 2015-06-08 MED ORDER — ZOLPIDEM TARTRATE 10 MG PO TABS: 10.0000 mg | ORAL_TABLET | Freq: Every evening | ORAL | Status: DC | PRN

## 2015-06-08 MED ORDER — HYDROCODONE-ACETAMINOPHEN 5-325 MG PO TABS: 1.0000 | ORAL_TABLET | Freq: Four times a day (QID) | ORAL | Status: DC | PRN

## 2015-06-08 MED ORDER — HYDROCODONE-ACETAMINOPHEN 5-325 MG PO TABS
1.0000 | ORAL_TABLET | Freq: Four times a day (QID) | ORAL | Status: DC | PRN
Start: 2015-06-08 — End: 2015-06-08

## 2015-06-08 NOTE — Progress Notes (Signed)
   Subjective:    Patient ID: Curtis [name], male    DOB: Sep 13, 1945, 70 y.o.   MRN: [id number]  HPI AWV- Annual Wellness Visit  The patient was seen for their annual wellness visit. The patient's past medical history, surgical history, and family history were reviewed. Pertinent vaccines were reviewed ( tetanus, pneumonia, shingles, flu) The patient's medication list was reviewed and updated.  The height and weight were entered. The patient's current BMI is: 34.50  Cognitive screening was completed. Outcome of Mini - Cog:  Falls within the past 6 months: none  Current tobacco usage: non-smoker (All patients who use tobacco were given written and verbal information on quitting)  Recent listing of emergency department/hospitalizations over the past year were reviewed.  current specialist the patient sees on a regular basis: none   Medicare annual wellness visit patient questionnaire was reviewed.  A written screening schedule for the patient for the next 5-10 years was given. Appropriate discussion of followup regarding next visit was discussed.  Patient has no concerns at this time.  Past medical history was reviewed including hypertension interstitial lung disease reflux issues chronic neck pain and back pain also insomnia. Patient also has a history of psoriasis.   Review of Systems Patient denies any headaches nausea vomiting chest tightness pressure pain he does get short of breath with activity related to his lung disease he denies abdominal pains vomiting diarrhea rectal bleeding hematuria relates neck pain joint pains and back pain patient denies any falls denies any depression    Objective:   Physical Exam Lungs are clear hearts regular abdomen soft obese extremities no edema skin warm dry neurologic grossly normal       Assessment & Plan:  Adult wellness-complete.wellness physical was conducted today. Importance of diet and exercise were discussed in detail.  In addition to this a discussion regarding safety was also covered. We also reviewed over immunizations and gave recommendations regarding current immunization needed for age. In addition to this additional areas were also touched on including: Preventative health exams needed: Colonoscopy Patient up-to-date next colonoscopy 2025  Patient was advised yearly wellness exam Blood pressure good control continue current measures Chronic neck pain pain medication prescribed for the patient. He knows not to operate heavy equipment while taking this or drive while taking this he will use this only when at rest or at home Insomnia he relates Ambien 5 mg does not help and would like to use a 10 mg this was prescribed history hyperglycemia check metabolic 7 History thrombocytopenia check CBC patient states easy bruising Screening PSA Follow-up 6 months

## 2015-06-13 ENCOUNTER — Other Ambulatory Visit: Payer: Self-pay | Admitting: Family Medicine

## 2015-06-13 LAB — CBC WITH DIFFERENTIAL/PLATELET
BASOS ABS: 0 {cells}/uL (ref 0–200)
Basophils Relative: 0 %
EOS ABS: 372 {cells}/uL (ref 15–500)
Eosinophils Relative: 4 %
HEMATOCRIT: 44.1 % (ref 38.5–50.0)
HEMOGLOBIN: 14.9 g/dL (ref 13.2–17.1)
LYMPHS ABS: 3069 {cells}/uL (ref 850–3900)
LYMPHS PCT: 33 %
MCH: 31.7 pg (ref 27.0–33.0)
MCHC: 33.8 g/dL (ref 32.0–36.0)
MCV: 93.8 fL (ref 80.0–100.0)
MONO ABS: 837 {cells}/uL (ref 200–950)
MPV: 12.9 fL — AB (ref 7.5–12.5)
Monocytes Relative: 9 %
NEUTROS PCT: 54 %
Neutro Abs: 5022 cells/uL (ref 1500–7800)
Platelets: 121 10*3/uL — ABNORMAL LOW (ref 140–400)
RBC: 4.7 MIL/uL (ref 4.20–5.80)
RDW: 12.8 % (ref 11.0–15.0)
WBC: 9.3 10*3/uL (ref 3.8–10.8)

## 2015-06-13 LAB — HEMOGLOBIN A1C
Hgb A1c MFr Bld: 5.6 % (ref <5.7)
Mean Plasma Glucose: 114 mg/dL

## 2015-06-13 LAB — BASIC METABOLIC PANEL
BUN: 7 mg/dL (ref 7–25)
CALCIUM: 9 mg/dL (ref 8.6–10.3)
CO2: 24 mmol/L (ref 20–31)
CREATININE: 0.81 mg/dL (ref 0.70–1.18)
Chloride: 105 mmol/L (ref 98–110)
GLUCOSE: 99 mg/dL (ref 65–99)
Potassium: 3.7 mmol/L (ref 3.5–5.3)
Sodium: 140 mmol/L (ref 135–146)

## 2015-06-13 LAB — LIPID PANEL
CHOLESTEROL: 145 mg/dL (ref 125–200)
HDL: 33 mg/dL — ABNORMAL LOW (ref >=40)
LDL Cholesterol: 92 mg/dL (ref <130)
TRIGLYCERIDES: 101 mg/dL (ref <150)
Total CHOL/HDL Ratio: 4.4 Ratio (ref <=5.0)
VLDL: 20 mg/dL (ref <30)

## 2015-06-13 LAB — HEPATIC FUNCTION PANEL
ALK PHOS: 51 U/L (ref 40–115)
ALT: 17 U/L (ref 9–46)
AST: 23 U/L (ref 10–35)
Albumin: 3.5 g/dL — ABNORMAL LOW (ref 3.6–5.1)
BILIRUBIN INDIRECT: 1 mg/dL (ref 0.2–1.2)
Bilirubin, Direct: 0.3 mg/dL — ABNORMAL HIGH (ref <=0.2)
TOTAL PROTEIN: 6.3 g/dL (ref 6.1–8.1)
Total Bilirubin: 1.3 mg/dL — ABNORMAL HIGH (ref 0.2–1.2)

## 2015-06-14 LAB — PSA: PSA: 0.55 ng/mL (ref <=4.00)

## 2015-06-19 ENCOUNTER — Other Ambulatory Visit: Payer: Self-pay | Admitting: *Deleted

## 2015-06-19 ENCOUNTER — Telehealth: Payer: Self-pay | Admitting: Family Medicine

## 2015-06-19 MED ORDER — CEFPROZIL 500 MG PO TABS: 500.0000 mg | ORAL_TABLET | Freq: Two times a day (BID) | ORAL | Status: DC

## 2015-06-19 MED ORDER — BENZONATATE 100 MG PO CAPS: 100.0000 mg | ORAL_CAPSULE | Freq: Three times a day (TID) | ORAL | Status: DC | PRN

## 2015-06-19 NOTE — Telephone Encounter (Signed)
Having wheezing and coughing up yellow mucus for the past week. Seen 6/1 for physical. 2/2 for intersitial lung disease.wants to get tessalon and cefzil 500 one bid that he took in February.

## 2015-06-19 NOTE — Telephone Encounter (Signed)
Repeat the same per dr Richardson Landry and send message to dr scott for him to see. meds sent to pharm. Pt notified.

## 2015-06-19 NOTE — Telephone Encounter (Signed)
Pt called stating that he was seen back in the winter for a cough and was prescribed an antibiotic. Pt states that Dr. Nicki Reaper told him that if he developed the cough again to call and he would call him in an antibiotic. Pt states that it is from the scar tissue in his lungs? I informed the pt that Dr. Nicki Reaper was out of the office all this week and that he would need to be seen for the antibiotic to be called in. Pt states that is untrue and that only Dr. Nicki Reaper knows what is going on with him. He wants one of the other Drs to look back and call in the antibiotic that he was prescribed in the winter.    Festus Barren

## 2015-06-26 ENCOUNTER — Encounter: Payer: Self-pay | Admitting: Family Medicine

## 2015-06-26 NOTE — Telephone Encounter (Signed)
A letter was sent to the patient to help clarify then in the future if he should have respiratory illnesses it is best for him to be seen for evaluation regarding antibiotic prescription-I believe the patient misunderstood that when I told him to call if having lung related issues that did not mean that we would always send an antibiotic instead it is mentioned that he should schedule an appointment

## 2015-07-20 ENCOUNTER — Encounter: Payer: Self-pay | Admitting: Family Medicine

## 2015-07-20 ENCOUNTER — Ambulatory Visit (INDEPENDENT_AMBULATORY_CARE_PROVIDER_SITE_OTHER): Payer: Medicare HMO | Admitting: Family Medicine

## 2015-07-20 VITALS — BP 122/86 | Temp 98.6°F | Ht 73.5 in | Wt 259.1 lb

## 2015-07-20 DIAGNOSIS — J0100 Acute maxillary sinusitis, unspecified: Secondary | ICD-10-CM | POA: Diagnosis not present

## 2015-07-20 MED ORDER — DOXYCYCLINE HYCLATE 100 MG PO CAPS: 100.0000 mg | ORAL_CAPSULE | Freq: Two times a day (BID) | ORAL | Status: DC

## 2015-07-20 MED ORDER — HYDROCODONE-ACETAMINOPHEN 5-325 MG PO TABS: 1.0000 | ORAL_TABLET | Freq: Four times a day (QID) | ORAL | Status: DC | PRN

## 2015-07-20 NOTE — Progress Notes (Signed)
   Subjective:    Patient ID: Curtis [name], male    DOB: 1945-07-08, 70 y.o.   MRN: [id number]  Sinusitis This is a new problem. The current episode started in the past 7 days. Associated symptoms include coughing, ear pain, headaches, neck pain, sinus pressure and a sore throat. Treatments tried: Advil,    Patient states no other concerns this visit. Patient relates frontal headache also relates ear pain some sinus congestion and sore throat  Review of Systems  HENT: Positive for ear pain, sinus pressure and sore throat.   Respiratory: Positive for cough.   Musculoskeletal: Positive for neck pain.  Neurological: Positive for headaches.  Patient did have a night sweat last night denies any high fever denies tick bites     Objective:   Physical Exam Patient is not tender in the temple region. No sinus tenderness. Eardrums normal throat is normal lungs with some crackles consistent with interstitial lung disease heart regular no rails or rhonchi no respiratory distress  Finger to nose normal Romberg negative no sinus stroke     Assessment & Plan:  Probable sinusitis doxycycline twice a day for 10 days if high fevers difficulty breathing or other problems occur go to ER call us if not improving

## 2015-07-21 ENCOUNTER — Encounter (HOSPITAL_COMMUNITY): Payer: Self-pay | Admitting: Emergency Medicine

## 2015-07-21 ENCOUNTER — Telehealth: Payer: Self-pay | Admitting: Family Medicine

## 2015-07-21 ENCOUNTER — Emergency Department (HOSPITAL_COMMUNITY): Payer: Medicare HMO

## 2015-07-21 ENCOUNTER — Encounter: Payer: Self-pay | Admitting: Family Medicine

## 2015-07-21 ENCOUNTER — Emergency Department (HOSPITAL_COMMUNITY)
Admission: EM | Admit: 2015-07-21 | Discharge: 2015-07-21 | Disposition: A | Discharge status: Home / Self Care | Payer: Medicare HMO | Attending: Emergency Medicine | Admitting: Emergency Medicine

## 2015-07-21 ENCOUNTER — Ambulatory Visit (INDEPENDENT_AMBULATORY_CARE_PROVIDER_SITE_OTHER): Payer: Medicare HMO | Admitting: Family Medicine

## 2015-07-21 VITALS — BP 148/84 | Temp 98.7°F | Ht 73.5 in

## 2015-07-21 DIAGNOSIS — M1990 Unspecified osteoarthritis, unspecified site: Secondary | ICD-10-CM | POA: Insufficient documentation

## 2015-07-21 DIAGNOSIS — G510 Bell's palsy: Secondary | ICD-10-CM | POA: Insufficient documentation

## 2015-07-21 DIAGNOSIS — J45909 Unspecified asthma, uncomplicated: Secondary | ICD-10-CM | POA: Insufficient documentation

## 2015-07-21 DIAGNOSIS — E785 Hyperlipidemia, unspecified: Secondary | ICD-10-CM | POA: Diagnosis not present

## 2015-07-21 DIAGNOSIS — I10 Essential (primary) hypertension: Secondary | ICD-10-CM | POA: Insufficient documentation

## 2015-07-21 DIAGNOSIS — Z79899 Other long term (current) drug therapy: Secondary | ICD-10-CM | POA: Diagnosis not present

## 2015-07-21 DIAGNOSIS — Z5181 Encounter for therapeutic drug level monitoring: Secondary | ICD-10-CM | POA: Insufficient documentation

## 2015-07-21 DIAGNOSIS — R29810 Facial weakness: Secondary | ICD-10-CM | POA: Diagnosis not present

## 2015-07-21 DIAGNOSIS — Z87891 Personal history of nicotine dependence: Secondary | ICD-10-CM | POA: Diagnosis not present

## 2015-07-21 DIAGNOSIS — R270 Ataxia, unspecified: Secondary | ICD-10-CM

## 2015-07-21 DIAGNOSIS — Z791 Long term (current) use of non-steroidal anti-inflammatories (NSAID): Secondary | ICD-10-CM | POA: Insufficient documentation

## 2015-07-21 LAB — APTT: APTT: 36 s (ref 24–37)

## 2015-07-21 LAB — CBC
HCT: 48.1 % (ref 39.0–52.0)
HEMOGLOBIN: 16.1 g/dL (ref 13.0–17.0)
MCH: 31.6 pg (ref 26.0–34.0)
MCHC: 33.5 g/dL (ref 30.0–36.0)
MCV: 94.3 fL (ref 78.0–100.0)
PLATELETS: 85 10*3/uL — AB (ref 150–400)
RBC: 5.1 MIL/uL (ref 4.22–5.81)
RDW: 12.6 % (ref 11.5–15.5)
WBC: 7.5 10*3/uL (ref 4.0–10.5)

## 2015-07-21 LAB — COMPREHENSIVE METABOLIC PANEL
ALBUMIN: 3.7 g/dL (ref 3.5–5.0)
ALK PHOS: 49 U/L (ref 38–126)
ALT: 25 U/L (ref 17–63)
ANION GAP: 7 (ref 5–15)
AST: 31 U/L (ref 15–41)
BUN: 9 mg/dL (ref 6–20)
CALCIUM: 8.9 mg/dL (ref 8.9–10.3)
CO2: 28 mmol/L (ref 22–32)
Chloride: 104 mmol/L (ref 101–111)
Creatinine, Ser: 0.88 mg/dL (ref 0.61–1.24)
GFR calc Af Amer: >60 mL/min (ref >60)
GFR calc non Af Amer: >60 mL/min (ref >60)
GLUCOSE: 93 mg/dL (ref 65–99)
Potassium: 3.7 mmol/L (ref 3.5–5.1)
SODIUM: 139 mmol/L (ref 135–145)
Total Bilirubin: 2.5 mg/dL — ABNORMAL HIGH (ref 0.3–1.2)
Total Protein: 7.2 g/dL (ref 6.5–8.1)

## 2015-07-21 LAB — DIFFERENTIAL
BASOS ABS: 0 10*3/uL (ref 0.0–0.1)
Basophils Relative: 0 %
EOS PCT: 4 %
Eosinophils Absolute: 0.3 10*3/uL (ref 0.0–0.7)
LYMPHS PCT: 17 %
Lymphs Abs: 1.3 10*3/uL (ref 0.7–4.0)
Monocytes Absolute: 0.9 10*3/uL (ref 0.1–1.0)
Monocytes Relative: 12 %
NEUTROS ABS: 5 10*3/uL (ref 1.7–7.7)
NEUTROS PCT: 67 %

## 2015-07-21 LAB — CBG MONITORING, ED: Glucose-Capillary: 92 mg/dL (ref 65–99)

## 2015-07-21 LAB — PROTIME-INR
INR: 1.08 (ref 0.00–1.49)
PROTHROMBIN TIME: 14.2 s (ref 11.6–15.2)

## 2015-07-21 MED ORDER — IBUPROFEN 800 MG PO TABS
800.0000 mg | ORAL_TABLET | Freq: Once | ORAL | Status: AC
  Administered 2015-07-21: 800 mg via ORAL
  Filled 2015-07-21: qty 1

## 2015-07-21 MED ORDER — HYDROCODONE-ACETAMINOPHEN 5-325 MG PO TABS: 1.0000 | ORAL_TABLET | ORAL | Status: DC | PRN

## 2015-07-21 MED ORDER — VALACYCLOVIR HCL 500 MG PO TABS: 500.0000 mg | ORAL_TABLET | Freq: Two times a day (BID) | ORAL | Status: DC

## 2015-07-21 MED ORDER — PREDNISONE 50 MG PO TABS: ORAL_TABLET | ORAL | Status: DC

## 2015-07-21 NOTE — ED Provider Notes (Signed)
CSN: [id number]     Arrival date & time 07/21/15  1507 History   First MD Initiated Contact with Patient 07/21/15 1509     Chief Complaint  Patient presents with  . Facial Droop     (Consider location/radiation/quality/duration/timing/severity/associated sxs/prior Treatment) HPI.Marland KitchenMarland KitchenMarland KitchenPatient presents with numbness on the left side of face since this morning with associated inability to completely open the left eye, disturbance of the tongue on the left side, and left mouth drooping. No extremity weakness or numbness. No confusion. Review systems positive for L headache for 3-4 days.  He saw his primary care doctor today and was referred to the emergency department.  He has taken nothing for his symptoms. Severity is moderate.  Past Medical History  Diagnosis Date  . Hypertension   . Dyslipidemia     Statin intolerant  . Idiopathic pulmonary fibrosis ([place])   . Arthritis   . Chest pain     Emergency room October 24, 2011, no MI  //   Nuclear, October, 2013, adenosine, EF 61%, no scar or ischemia  . Psoriasis   . Statin intolerance   . GERD (gastroesophageal reflux disease)   . Asthma   . Left shoulder pain 2011  . Cervical spondylosis   . Interstitial pulmonary fibrosis ([place]) 2012  . Glaucoma   . Hearing loss    Past Surgical History  Procedure Laterality Date  . Neck surgery      cervical disc with cliip  . Colonoscopy    . Cholecystectomy  05/08/2012  . Cholecystectomy N/A 05/08/2012    Procedure: LAPAROSCOPIC CHOLECYSTECTOMY;  Surgeon: Harl Bowie, MD;  Location: [facility];  Service: General;  Laterality: N/A;  . Liver biopsy N/A 05/08/2012    Procedure: LIVER BIOPSY;  Surgeon: Harl Bowie, MD;  Location: [facility];  Service: General;  Laterality: N/A;  . Cataract extraction w/phaco Right 06/11/2012    Procedure: CATARACT EXTRACTION PHACO AND INTRAOCULAR LENS PLACEMENT ([place]);  Surgeon: Tonny Branch, MD;  Location: [facility];  Service: Ophthalmology;  Laterality: Right;  CDE:  14.43   . Cataract extraction w/phaco Left 06/29/2012    Procedure: CATARACT EXTRACTION PHACO AND INTRAOCULAR LENS PLACEMENT ([facility]);  Surgeon: Tonny Branch, MD;  Location: [facility];  Service: Ophthalmology;  Laterality: Left;  CDE:14.11  . Brain surgery     Family History  Problem Relation Age of Onset  . Cancer Sister     breast  . Hypertension Mother   . Congestive Heart Failure Mother   . COPD Mother   . Stroke Father    Social History  Substance Use Topics  . Smoking status: Former Smoker -- 0.50 packs/day for 52 years    Types: Cigarettes    Quit date: 09/08/2003  . Smokeless tobacco: Never Used  . Alcohol Use: No     Comment: quit drinking 2004    Review of Systems  All other systems reviewed and are negative.     Allergies  Oxycodone; Pravastatin; Simvastatin; Prednisone; and Tegretol  Home Medications   Prior to Admission medications   Medication Sig Start Date End Date Taking? Authorizing Provider  doxycycline (VIBRAMYCIN) 100 MG capsule Take 1 capsule (100 mg total) by mouth 2 (two) times daily. 07/20/15  Yes Kathyrn Drown, MD  hydrochlorothiazide (HYDRODIURIL) 25 MG tablet TAKE 1 TABLET BY MOUTH EVERY DAY 12/09/14  Yes Kathyrn Drown, MD  ibuprofen (ADVIL,MOTRIN) 200 MG tablet Take 600 mg by mouth every 6 (six) hours as needed for moderate pain.  Yes Historical Provider, MD  latanoprost (XALATAN) 0.005 % ophthalmic solution Place 1 drop into both eyes at bedtime. Reported on 07/21/2015 09/09/12  Yes Historical Provider, MD  omeprazole (PRILOSEC) 20 MG capsule Take 20 mg by mouth daily as needed. Reported on 07/21/2015   Yes Historical Provider, MD  zolpidem (AMBIEN) 10 MG tablet Take 1 tablet (10 mg total) by mouth at bedtime as needed for sleep. 06/08/15  Yes Kathyrn Drown, MD  benzonatate (TESSALON PERLES) 100 MG capsule Take 1 capsule (100 mg total) by mouth 3 (three) times daily as needed for cough. Patient not taking: Reported on 07/20/2015 06/19/15   Mikey Kirschner, MD   cefPROZIL (CEFZIL) 500 MG tablet Take 1 tablet (500 mg total) by mouth 2 (two) times daily. Patient not taking: Reported on 07/20/2015 06/19/15   Mikey Kirschner, MD  HYDROcodone-acetaminophen (NORCO/VICODIN) 5-325 MG tablet Take 1-2 tablets by mouth every 4 (four) hours as needed. 07/21/15   Nat Christen, MD  predniSONE (DELTASONE) 50 MG tablet 1 tablet for 5 days, one half tablet for 5 days 07/21/15   Nat Christen, MD  valACYclovir (VALTREX) 500 MG tablet Take 1 tablet (500 mg total) by mouth 2 (two) times daily. 07/21/15   Nat Christen, MD   BP 134/84 mmHg  Pulse 86  Temp(Src) 97.8 F (36.6 C) (Oral)  Resp 23  Ht 6' 1.25" (1.861 m)  Wt 259 lb (117.482 kg)  BMI 33.92 kg/m2  SpO2 95% Physical Exam  Constitutional: He is oriented to person, place, and time. He appears well-developed and well-nourished.  HENT:  Head: Normocephalic and atraumatic.  Eyes: Conjunctivae are normal.  Neck: Neck supple.  Cardiovascular: Normal rate and regular rhythm.   Pulmonary/Chest: Effort normal and breath sounds normal.  Abdominal: Soft. Bowel sounds are normal.  Musculoskeletal: Normal range of motion.  Neurological: He is alert and oriented to person, place, and time.  Left-sided facial numbness, drooping of the left eyelid, drooping of the left corner of the mouth.  No extremity motor or sensory deficits  Skin: Skin is warm and dry.  Psychiatric: He has a normal mood and affect. His behavior is normal.  Nursing note and vitals reviewed.   ED Course  Procedures (including critical care time) Labs Review Labs Reviewed  CBC - Abnormal; Notable for the following:    Platelets 85 (*)    All other components within normal limits  COMPREHENSIVE METABOLIC PANEL - Abnormal; Notable for the following:    Total Bilirubin 2.5 (*)    All other components within normal limits  PROTIME-INR  APTT  DIFFERENTIAL  CBG MONITORING, ED    Imaging Review Ct Head Wo Contrast  07/21/2015  CLINICAL DATA:   Left-sided pain for 3-4 days. EXAM: CT HEAD WITHOUT CONTRAST TECHNIQUE: Contiguous axial images were obtained from the base of the skull through the vertex without intravenous contrast. COMPARISON:  MR brain 04/14/2014, 10/19/2009 FINDINGS: There is no evidence of mass effect, midline shift or extra-axial fluid collections. There is no evidence of a space-occupying lesion or intracranial hemorrhage. There is no evidence of a cortical-based area of acute infarction. There is relative hyperdensity of the left MCA relative to the right which may reflect thrombus in the left MCA. The ventricles and sulci are appropriate for the patient's age. The basal cisterns are patent. Visualized portions of the orbits are unremarkable. The visualized portions of the paranasal sinuses and mastoid air cells are unremarkable. There is evidence of prior right suboccipital craniotomy. No  acute osseous abnormality. IMPRESSION: 1. No evidence of acute infarct. 2. Relative hyperdensity of the left MCA relative to the right which may reflect thrombus in the left MCA. If there is further clinical concern regarding acute MCA thrombosis, recommend further evaluation with a CTA of the brain. 3. . Electronically Signed   By: Kathreen Devoid   On: 07/21/2015 17:25   I have personally reviewed and evaluated these images and lab results as part of my medical decision-making.   EKG Interpretation   Date/Time:  Friday July 21 2015 15:16:16 EDT Ventricular Rate:  90 PR Interval:    QRS Duration: 93 QT Interval:  362 QTC Calculation: 443 R Axis:   29 Text Interpretation:  Sinus rhythm Atrial premature complexes Borderline  short PR interval Abnormal R-wave progression, early transition Borderline  ST depression, anterolateral leads Baseline wander in lead(s) I V3 V4 V5  V6 Confirmed by Ananiah Maciolek  MD, Rilley Stash ([id number]) on 07/21/2015 4:51:34 PM      MDM   Final diagnoses:  Bell's palsy    History and physical consistent with Bell's palsy.  Clinically, patient has no evidence of an acute infarct. Specifically no right or left-sided weakness or numbness. Discussed CT scan with radiologist. Discharge medications Valtrex, prednisone, Vicodin.    Nat Christen, MD 07/21/15 [id number]

## 2015-07-21 NOTE — Progress Notes (Signed)
   Subjective:    Patient ID: Curtis [name], male    DOB: 1945/09/23, 70 y.o.   MRN: [id number]  HPI Patient has c/o bells palsy to left side of face, difficulty chewing, slurred speech. Onset this morning.  This patient yesterday was having some sinus congestion some frontal headache in a sore throat patient did not have any weakness or numbness yesterday. Apparently had headaches through the night and stayed that the headache started becoming more on the left side of his head he states it's tender to the touch but no redness or rash  His wife states that she noted that he was having left sided facial weakness along with difficulty speaking because of the facial weakness the patient was also complaining of some dizziness when he stood up. PMH benign Review of Systems See above patient does relate low grade temperature at home    Objective:   Physical Exam  Eardrums are normal throat is normal lungs are relatively clear with some crackles consistent with interstitial lung disease heart regular pulse normal finger to nose is normal Romberg is negative patient walks slowly in the hallway with some ataxia that was not present yesterday Patient complains of pain on the left side of his had no lesions are seen Facial weakness patient is able to raise his eyebrows slightly able to close his eye to some degree but has profound left side facial weakness      Assessment & Plan:  Although this presents more like Bell's palsy because of his headache and the ataxia I am compelled to make sure that he is not suffering a stroke or bleed. I spoke with the ER doctor who will see him right away. They will proceed forward with doing scans. If negative for stroke then we'll treat as Bell's palsy with probable Valtrex prednisone and follow-up office visit Monday morning Obviously if stroke is present patient will need to be admitted entry did according

## 2015-07-21 NOTE — Discharge Instructions (Signed)
Bell Palsy Bell palsy is a condition in which the muscles on one side of the face become paralyzed. This often causes one side of the face to droop. It is a common condition and most people recover completely. RISK FACTORS Risk factors for Bell palsy include:  Pregnancy.  Diabetes.  An infection by a virus, such as infections that cause cold sores. CAUSES  Bell palsy is caused by damage to or inflammation of a nerve in your face. It is unclear why this happens, but an infection by a virus may lead to it. Most of the time the reason it happens is unknown. SIGNS AND SYMPTOMS  Symptoms can range from mild to severe and can take place over a number of hours. Symptoms may include:  Being unable to:  Raise one or both eyebrows.  Close one or both eyes.  Feel parts of your face (facial numbness).  Drooping of the eyelid and corner of the mouth.  Weakness in the face.  Paralysis of half your face.  Loss of taste.  Sensitivity to loud noises.  Difficulty chewing.  Tearing up of the affected eye.  Dryness in the affected eye.  Drooling.  Pain behind one ear. DIAGNOSIS  Diagnosis of Bell palsy may include:  A medical history and physical exam.  An MRI.  A CT scan.  Electromyography (EMG). This is a test that checks how your nerves are working. TREATMENT  Treatment may include antiviral medicine to help shorten the length of the condition. Sometimes treatment is not needed and the symptoms go away on their own. HOME CARE INSTRUCTIONS   Take medicines only as directed by your health care provider.  Do facial massages and exercises as directed by your health care provider.  If your eye is affected:  Use moisturizing eye drops to prevent drying of your eye as directed by your health care provider.  Protect your eye as directed by your health care provider. SEEK MEDICAL CARE IF:  Your symptoms do not get better or get worse.  You are drooling.  Your eye is red,  irritated, or hurts. SEEK IMMEDIATE MEDICAL CARE IF:   Another part of your body feels weak or numb.  You have difficulty swallowing.  You have a fever along with symptoms of Bell palsy.  You develop neck pain. MAKE SURE YOU:   Understand these instructions.  Will watch your condition.  Will get help right away if you are not doing well or get worse.   This information is not intended to replace advice given to you by your health care provider. Make sure you discuss any questions you have with your health care provider.   Document Released: 12/24/2004 Document Revised: 09/14/2014 Document Reviewed: 04/02/2013 Elsevier Interactive Patient Education 2016 [employer].   Prescription for prednisone, antiviral medication, pain medication. Follow-up your primary care doctor.

## 2015-07-21 NOTE — ED Notes (Signed)
Pt requesting medication for HA and otalgia. Dr. Lacinda Axon notified. Verbal order for PO 800mg  Motrin.

## 2015-07-21 NOTE — ED Notes (Addendum)
Patient complaining of pain to left side of head x 3-4 days. States he woke this morning at approximately 0900 with left sided facial droop and facial numbness. States he was fine when he went to bed last night. States he was seen by PCP yesterday and given antibiotic for "sinus and other stuff." States he had "brain surgery" for neuralgia in March 2017.

## 2015-07-21 NOTE — Telephone Encounter (Signed)
Patient came in for appointment per Dr. Nicki Reaper

## 2015-07-21 NOTE — Telephone Encounter (Signed)
Pt's wife has some questions regarding what the pt was told yesterday at his appt. Wife is able to speak with [place] per [employer].

## 2015-07-24 ENCOUNTER — Encounter: Payer: Self-pay | Admitting: Family Medicine

## 2015-07-24 ENCOUNTER — Ambulatory Visit (INDEPENDENT_AMBULATORY_CARE_PROVIDER_SITE_OTHER): Payer: Medicare HMO | Admitting: Family Medicine

## 2015-07-24 VITALS — BP 128/76 | Ht 73.5 in | Wt 259.0 lb

## 2015-07-24 DIAGNOSIS — G510 Bell's palsy: Secondary | ICD-10-CM | POA: Diagnosis not present

## 2015-07-24 MED ORDER — HYDROCODONE-ACETAMINOPHEN 10-325 MG PO TABS: 1.0000 | ORAL_TABLET | ORAL | Status: DC | PRN

## 2015-07-24 NOTE — Progress Notes (Signed)
   Subjective:    Patient ID: Curtis [name], male    DOB: 02-05-1945, 70 y.o.   MRN: [id number]  HPIFollow up ED visit for Bell's palsy.  Eye is still hurting, mouth is sore, hard to eat. Taking hydrocodone about every 4 hours.  Patient was seen in the office this past week and referred to the ER had further studies done there which help rule out stroke. Patient still having some weakness. Pt also wants forms filled out for [employer] so they will not turn off power since he has oxygen. Pt states they by accident turned his power off.   Review of Systems No fever no vomiting denies any unilateral numbness weakness.    Objective:   Physical Exam Patient has weakness on the left side of face including difficulty raising his eyebrow he has difficulty closing his eye all the way He denies any unilateral weakness and does not have any weakness on today's exam and states she's feeling better compared to how he was but he still has a facial weakness      Assessment & Plan:  Urgent referral to ENT for evaluation regarding Bell's palsy and ongoing discomfort Prescription for hydrocodone given continue Valtrex I doubt tick related illness but sometimes lines disease can cause facial weakness I recommend doxycycline twice a day for 7 days See ENT urgently for further evaluation Lacri-Lube and artificial tears for the eyes Recheck here within 10 days

## 2015-07-27 ENCOUNTER — Ambulatory Visit (INDEPENDENT_AMBULATORY_CARE_PROVIDER_SITE_OTHER): Payer: Medicare HMO | Admitting: Otolaryngology

## 2015-08-03 ENCOUNTER — Encounter: Payer: Self-pay | Admitting: Family Medicine

## 2015-08-03 ENCOUNTER — Ambulatory Visit (INDEPENDENT_AMBULATORY_CARE_PROVIDER_SITE_OTHER): Payer: Medicare HMO | Admitting: Family Medicine

## 2015-08-03 VITALS — BP 150/90 | Ht 73.5 in | Wt 254.6 lb

## 2015-08-03 DIAGNOSIS — G510 Bell's palsy: Secondary | ICD-10-CM | POA: Diagnosis not present

## 2015-08-03 NOTE — Progress Notes (Signed)
   Subjective:    Patient ID: Curtis [name], male    DOB: October 22, 1945, 70 y.o.   MRN: [id number]  HPI Patient arrives for a follow up on bell's palsy. Patient has not seen much improvement and is still having ear pain as well Patient states he does use a hydrocodone intermittently when he is at home denies any abusing of it. He states he sees minimal improvement in the facial area he is doing the best job he can keep his eyes moisturize  Review of Systems     Objective:   Physical Exam Significant weakness on the left side of face she is now starting to be able to raise his eyebrow but he has significant weakness of the left side of face I believe you need to see ENT may need MRI       Assessment & Plan:  Bell's palsy-I'm very concerned about this patient he is not recovering as well as I would expected I did refer him to ENT the patient did not go because he felt like he just been too too many doctors. I impressed upon the patient the necessity of going we try to get him set up for this coming week he is uncertain if he will go he was once again told the importance of go

## 2015-08-04 NOTE — Progress Notes (Signed)
Office note faxed.

## 2015-08-10 ENCOUNTER — Ambulatory Visit (INDEPENDENT_AMBULATORY_CARE_PROVIDER_SITE_OTHER): Payer: Medicare HMO | Admitting: Otolaryngology

## 2015-08-10 DIAGNOSIS — H903 Sensorineural hearing loss, bilateral: Secondary | ICD-10-CM | POA: Diagnosis not present

## 2015-08-10 DIAGNOSIS — G510 Bell's palsy: Secondary | ICD-10-CM

## 2015-08-25 ENCOUNTER — Telehealth: Payer: Self-pay | Admitting: Family Medicine

## 2015-08-25 NOTE — Telephone Encounter (Signed)
Form from [employer] to help Curtis [name] with his energy bill, in your yellow folder

## 2015-08-27 NOTE — Telephone Encounter (Signed)
Please verify with the patient regarding electronic medical men he has at home-oxygen concentrator? CPAP machine? Any other things? Please fill this into the form then forward back to the patient-thank you

## 2015-08-28 NOTE — Telephone Encounter (Signed)
Left message return call 08/28/15 

## 2015-08-30 NOTE — Telephone Encounter (Signed)
Left message return 08/30/15

## 2015-08-31 NOTE — Telephone Encounter (Signed)
Spoke with patient and patient stated that he has an oxygen concentrator in his home. Portion of form filled out with type of equipment. Patient notified that form is filled and ready for pick up. Patient verbalized understanding.

## 2015-09-05 ENCOUNTER — Other Ambulatory Visit: Payer: Self-pay | Admitting: Family Medicine

## 2015-09-06 ENCOUNTER — Telehealth: Payer: Self-pay | Admitting: Family Medicine

## 2015-09-06 MED ORDER — HYDROCHLOROTHIAZIDE 25 MG PO TABS: 25.0000 mg | ORAL_TABLET | Freq: Every day | ORAL | 0 refills | Status: DC

## 2015-09-06 NOTE — Telephone Encounter (Signed)
Pt is needing a refill on his hydrochlorothiazide (HYDRODIURIL) 25 MG tablet      [facility]

## 2015-09-06 NOTE — Telephone Encounter (Signed)
Prescription sent electronically to pharmacy. Patient notified. 

## 2015-09-29 ENCOUNTER — Ambulatory Visit: Payer: Medicare HMO | Admitting: Family Medicine

## 2015-10-02 ENCOUNTER — Encounter: Payer: Self-pay | Admitting: Family Medicine

## 2015-10-02 ENCOUNTER — Ambulatory Visit (INDEPENDENT_AMBULATORY_CARE_PROVIDER_SITE_OTHER): Payer: Medicare HMO | Admitting: Family Medicine

## 2015-10-02 VITALS — BP 114/80 | Temp 98.0°F | Ht 73.5 in | Wt 250.1 lb

## 2015-10-02 DIAGNOSIS — J019 Acute sinusitis, unspecified: Secondary | ICD-10-CM | POA: Diagnosis not present

## 2015-10-02 DIAGNOSIS — B9689 Other specified bacterial agents as the cause of diseases classified elsewhere: Secondary | ICD-10-CM

## 2015-10-02 DIAGNOSIS — J8410 Pulmonary fibrosis, unspecified: Secondary | ICD-10-CM | POA: Diagnosis not present

## 2015-10-02 MED ORDER — HYDROCODONE-ACETAMINOPHEN 10-325 MG PO TABS
1.0000 | ORAL_TABLET | Freq: Three times a day (TID) | ORAL | 0 refills | Status: DC | PRN
Start: 2015-10-02 — End: 2015-10-02

## 2015-10-02 MED ORDER — AMOXICILLIN-POT CLAVULANATE 875-125 MG PO TABS: 1.0000 | ORAL_TABLET | Freq: Two times a day (BID) | ORAL | 0 refills | Status: DC

## 2015-10-02 MED ORDER — HYDROCODONE-ACETAMINOPHEN 10-325 MG PO TABS: 1.0000 | ORAL_TABLET | Freq: Three times a day (TID) | ORAL | 0 refills | Status: DC | PRN

## 2015-10-02 NOTE — Progress Notes (Signed)
   Subjective:    Patient ID: Curtis [name], male    DOB: [birth year], 70 y.o.   MRN: [id number]  Cough  This is a recurrent problem. The current episode started in the past 7 days. Associated symptoms include rhinorrhea. Pertinent negatives include no chest pain, ear pain, fever or wheezing.  Patient has underlying lung condition. Denies any severe shortness of breath. States that albuterol never is helped his wheezing  Patient states no other concerns this visit.   Review of Systems  Constitutional: Negative for activity change and fever.  HENT: Positive for congestion and rhinorrhea. Negative for ear pain.   Eyes: Negative for discharge.  Respiratory: Positive for cough. Negative for wheezing.   Cardiovascular: Negative for chest pain.       Objective:   Physical Exam  Constitutional: He appears well-developed.  HENT:  Head: Normocephalic.  Mouth/Throat: Oropharynx is clear and moist. No oropharyngeal exudate.  Neck: Normal range of motion.  Cardiovascular: Normal rate, regular rhythm and normal heart sounds.   No murmur heard. Pulmonary/Chest: Effort normal and breath sounds normal. He has no wheezes.  Lymphadenopathy:    He has no cervical adenopathy.  Neurological: He exhibits normal muscle tone.  Skin: Skin is warm and dry.  Nursing note and vitals reviewed.         Assessment & Plan:  Interstitial lung disease stable Acute bronchitis Acute rhinosinusitis Antibiotics prescribed warning signs discussed Chronic back pain and neck pain prescription for pain medicines given. Regular follow-up at least every 3 months.

## 2015-10-23 ENCOUNTER — Ambulatory Visit (INDEPENDENT_AMBULATORY_CARE_PROVIDER_SITE_OTHER): Payer: Medicare HMO | Admitting: Family Medicine

## 2015-10-23 ENCOUNTER — Encounter: Payer: Self-pay | Admitting: Family Medicine

## 2015-10-23 VITALS — BP 122/80 | Temp 97.6°F | Ht 73.5 in | Wt 256.1 lb

## 2015-10-23 DIAGNOSIS — Z23 Encounter for immunization: Secondary | ICD-10-CM | POA: Diagnosis not present

## 2015-10-23 DIAGNOSIS — G8929 Other chronic pain: Secondary | ICD-10-CM | POA: Diagnosis not present

## 2015-10-23 DIAGNOSIS — J019 Acute sinusitis, unspecified: Secondary | ICD-10-CM

## 2015-10-23 DIAGNOSIS — M542 Cervicalgia: Secondary | ICD-10-CM | POA: Diagnosis not present

## 2015-10-23 DIAGNOSIS — B9689 Other specified bacterial agents as the cause of diseases classified elsewhere: Secondary | ICD-10-CM | POA: Diagnosis not present

## 2015-10-23 DIAGNOSIS — J8410 Pulmonary fibrosis, unspecified: Secondary | ICD-10-CM

## 2015-10-23 DIAGNOSIS — I480 Paroxysmal atrial fibrillation: Secondary | ICD-10-CM | POA: Diagnosis not present

## 2015-10-23 MED ORDER — CEFPROZIL 500 MG PO TABS: 500.0000 mg | ORAL_TABLET | Freq: Two times a day (BID) | ORAL | 0 refills | Status: DC

## 2015-10-23 MED ORDER — HYDROCODONE-ACETAMINOPHEN 10-325 MG PO TABS: 1.0000 | ORAL_TABLET | Freq: Three times a day (TID) | ORAL | 0 refills | Status: DC | PRN

## 2015-10-23 MED ORDER — ALBUTEROL SULFATE HFA 108 (90 BASE) MCG/ACT IN AERS: 2.0000 | INHALATION_SPRAY | Freq: Four times a day (QID) | RESPIRATORY_TRACT | 2 refills | Status: DC | PRN

## 2015-10-23 NOTE — Progress Notes (Signed)
Order for referral put in.  

## 2015-10-23 NOTE — Progress Notes (Signed)
   Subjective:    Patient ID: Curtis [name], male    DOB: 1945/02/18, 70 y.o.   MRN: [id number]  Cough  This is a new problem. The current episode started 1 to 4 weeks ago. The problem has been unchanged. Associated symptoms include rhinorrhea and wheezing. Pertinent negatives include no chest pain, ear pain or fever. Nothing aggravates the symptoms. Treatments tried: antibiotics. The treatment provided no relief.   Patient has no other concerns at this time.  The patient also has chronic back pain. He takes pain medicine typically twice a day. Sometimes has to take it 3 times a day. Still does part-time work. Denies any complications recently. Does not get drowsy with the medicine or nauseous with the medicine  Patient has noticed low energy over the past couple months he denies getting out of breath but just states he doesn't have as much energy is used to having done because of his age or cause of his chronic lung issue he is post to see the specialist at [facility] coming up  Review of Systems  Constitutional: Negative for activity change and fever.  HENT: Positive for congestion and rhinorrhea. Negative for ear pain.   Eyes: Negative for discharge.  Respiratory: Positive for cough and wheezing.   Cardiovascular: Negative for chest pain.       Objective:   Physical Exam  Constitutional: He appears well-developed.  HENT:  Head: Normocephalic.  Mouth/Throat: Oropharynx is clear and moist. No oropharyngeal exudate.  Neck: Normal range of motion.  Cardiovascular: Normal rate, regular rhythm and normal heart sounds.   No murmur heard. Pulmonary/Chest: Effort normal and breath sounds normal. He has no wheezes.  Lymphadenopathy:    He has no cervical adenopathy.  Neurological: He exhibits normal muscle tone.  Skin: Skin is warm and dry.  Nursing note and vitals reviewed.  On exam today I find no evidence of wheezing no evidence of a pneumonia.  25 minutes was spent  with the patient. Greater than half the time was spent in discussion and answering questions and counseling regarding the issues that the patient came in for today.      Assessment & Plan:  Viral syndrome Chronic interstitial lung disease follow-up with specialists Prescription for albuterol given in case a wheezing Flu shot today Probable sinus infection antibiotics prescribed New onset atrial fibrillation somewhere in the past 3 months compared to EKG from July which did not show any signs of atrial fibrillation Referral to cardiology he is at higher risk of stroke because of underlying health issues-cardiology may decide the further workup and try to get him back in a normal sinus rhythm Pain medications given 3 scripts given follow-up in approximate 3 months

## 2015-10-23 NOTE — Addendum Note (Signed)
Addended by: Carmelina Noun on: 10/23/2015 03:42 PM   Modules accepted: Orders

## 2015-10-24 ENCOUNTER — Encounter: Payer: Self-pay | Admitting: Family Medicine

## 2015-10-24 ENCOUNTER — Telehealth: Payer: Self-pay | Admitting: Family Medicine

## 2015-10-24 NOTE — Telephone Encounter (Signed)
Pt did not get inhaler yesterday due to his out of pocket cost  Is there something else cheaper he could use?    He did pick up antibiotic  Inhaler was like $140 and he can't afford that [employer])  (Wife is able to get it much cheaper with her insurance but they do not have the same coverage)

## 2015-10-24 NOTE — Telephone Encounter (Signed)
Unfortunately albuterol inhalers are often expensive. He or his wife can call [facility] to see if they have a better price on albuterol inhaler. If so we will be happy to send a prescription to them. Another option would be for the patient to talk with [facility] if albuterol nebulizers would be covered by his insurance if so that might be a lot cheaper than a inhaler. He or his wife will need to do some phone calls and then get back to [place]

## 2015-10-25 NOTE — Telephone Encounter (Signed)
Spoke with patient and informed her per Dr.Scott Luking- Unfortunately albuterol inhalers are often expensive. He or his wife can call [facility] to see if they have a better price on albuterol inhaler. If so we will be happy to send a prescription to them. Another option would be for the patient to talk with [facility] if albuterol nebulizers would be covered by his insurance if so that might be a lot cheaper than a inhaler. He or his wife will need to do some phone calls and then get back to [place]. Patient verbalized understanding.

## 2015-11-02 ENCOUNTER — Ambulatory Visit: Payer: Medicare HMO

## 2015-11-13 NOTE — Progress Notes (Signed)
Cardiology Office Note   Date:  11/14/2015   ID:  Thelonius Holstad, DOB 1945-09-16, MRN [id number]  PCP:  Sallee Lange, MD  Cardiologist:   Jenkins Rouge, MD   No chief complaint on file.     History of Present Illness: Curtis [name] is a 70 y.o. male who presents for evaluation of afib. New onset Since July by ECG at Dr Lance Sell office. History of COPD and chronic interstitial  Lung dx with dyspnea and fatigue  Seen by cardiology 2013 and had normal myovue study with EF 61%  Had atypical chest pain. History of HTN and elevated lipids but  Intolerant to statins  Having some issues affording albuterol for his COPD Sees Wynn Maudlin at [facility] for UIP and lung disease     Past Medical History:  Diagnosis Date  . Arthritis   . Asthma   . Cervical spondylosis   . Chest pain    Emergency room October 24, 2011, no MI  //   Nuclear, October, 2013, adenosine, EF 61%, no scar or ischemia  . Dyslipidemia    Statin intolerant  . GERD (gastroesophageal reflux disease)   . Glaucoma   . Hearing loss   . Hypertension   . Idiopathic pulmonary fibrosis ([place])   . Interstitial pulmonary fibrosis ([place]) 2012  . Left shoulder pain 2011  . Psoriasis   . Statin intolerance     Past Surgical History:  Procedure Laterality Date  . BRAIN SURGERY    . CATARACT EXTRACTION W/PHACO Right 06/11/2012   Procedure: CATARACT EXTRACTION PHACO AND INTRAOCULAR LENS PLACEMENT (IOC);  Surgeon: Tonny Branch, MD;  Location: [facility];  Service: Ophthalmology;  Laterality: Right;  CDE:  14.43  . CATARACT EXTRACTION W/PHACO Left 06/29/2012   Procedure: CATARACT EXTRACTION PHACO AND INTRAOCULAR LENS PLACEMENT (IOC);  Surgeon: Tonny Branch, MD;  Location: [facility];  Service: Ophthalmology;  Laterality: Left;  CDE:14.11  . CHOLECYSTECTOMY  05/08/2012  . CHOLECYSTECTOMY N/A 05/08/2012   Procedure: LAPAROSCOPIC CHOLECYSTECTOMY;  Surgeon: Harl Bowie, MD;  Location: [place];  Service: General;  Laterality: N/A;  .  COLONOSCOPY    . LIVER BIOPSY N/A 05/08/2012   Procedure: LIVER BIOPSY;  Surgeon: Harl Bowie, MD;  Location: [place];  Service: General;  Laterality: N/A;  . NECK SURGERY     cervical disc with cliip     Current Outpatient Prescriptions  Medication Sig Dispense Refill  . albuterol (PROVENTIL HFA;VENTOLIN HFA) 108 (90 Base) MCG/ACT inhaler Inhale 2 puffs into the lungs every 6 (six) hours as needed for wheezing. 1 Inhaler 2  . hydrochlorothiazide (HYDRODIURIL) 25 MG tablet Take 1 tablet (25 mg total) by mouth daily. 90 tablet 0  . HYDROcodone-acetaminophen (NORCO) 10-325 MG tablet Take 1 tablet by mouth 3 (three) times daily as needed. 90 tablet 0  . latanoprost (XALATAN) 0.005 % ophthalmic solution Place 1 drop into both eyes at bedtime. Reported on 07/21/2015    . omeprazole (PRILOSEC) 20 MG capsule Take 20 mg by mouth daily as needed. Reported on 07/21/2015    . predniSONE (DELTASONE) 10 MG tablet Take 10 mg by mouth daily with breakfast.     No current facility-administered medications for this visit.     Allergies:   Oxycodone; Pravastatin; Simvastatin; Prednisone; and Tegretol [carbamazepine]    Social History:  The patient  reports that he quit smoking about 12 years ago. His smoking use included Cigarettes. He has a 26.00 pack-year smoking history. He has never used smokeless  tobacco. He reports that he does not drink alcohol or use drugs.   Family History:  The patient's family history includes COPD in his mother; Cancer in his sister; Congestive Heart Failure in his mother; Hypertension in his mother; Stroke in his father.    ROS:  Please see the history of present illness.   Otherwise, review of systems are positive for none.   All other systems are reviewed and negative.    PHYSICAL EXAM: VS:  BP 130/80   Pulse 91   Ht 6' 1.25" (1.861 m)   Wt 116.1 kg (256 lb)   SpO2 95%   BMI 33.54 kg/m  , BMI Body mass index is 33.54 kg/m. Affect appropriate Healthy:  appears  stated age 49: normal Neck supple with no adenopathy JVP normal no bruits no thyromegaly Lungs Inspitory crackles and exp wheezing  Heart:  S1/S2 no murmur, no rub, gallop or click PMI normal Abdomen: benighn, BS positve, no tenderness, no AAA no bruit.  No HSM or HJR Distal pulses intact with no bruits No edema Neuro non-focal Skin warm and dry No muscular weakness    EKG:  afib normal ST segments 10/23/15  07/24/15 SR rate 90 afib new since July    Recent Labs: 07/21/2015: ALT 25; BUN 9; Creatinine, Ser 0.88; Hemoglobin 16.1; Platelets 85; Potassium 3.7; Sodium 139    Lipid Panel    Component Value Date/Time   CHOL 145 06/13/2015 0728   TRIG 101 06/13/2015 0728   HDL 33 (L) 06/13/2015 0728   CHOLHDL 4.4 06/13/2015 0728   VLDL 20 06/13/2015 0728   LDLCALC 92 06/13/2015 0728      Wt Readings from Last 3 Encounters:  11/14/15 116.1 kg (256 lb)  10/23/15 116.2 kg (256 lb 2 oz)  10/02/15 113.5 kg (250 lb 2 oz)      Other studies Reviewed: Additional studies/ records that were reviewed today include: Notes cardiology 2013 Notes [facility] pulmonary Dr Wolfgang Phoenix Notes and ECG .    ASSESSMENT AND PLAN:  1.  Afib:  Sub acute onset Likely contributing to more dyspnea Rate not that high Check 24 hr holter for average HR Echo for LA size and EF.  Start eliquis 5 bid Labs today including CBC PLT and BMET  F/U with me in 3-4 weeks to Plan [facility] if still in afib.   2 ILD:  F/u [facility] off steroids major limiting factor in regard to activity  3. HTN:  Well controlled.  Continue current medications and low sodium Dash type diet.      Current medicines are reviewed at length with the patient today.  The patient does not have concerns regarding medicines.  The following changes have been made:  Eliquis 5 bid   Labs/ tests ordered today include: CBC PLT BMET  No orders of the defined types were placed in this encounter.    Disposition:   FU with me in 3-4 weeks       Signed, Jenkins Rouge, MD  11/14/2015 8:49 AM    [facility] [place], [place], [place]  [address] Phone: [phone]; Fax: [phone]

## 2015-11-14 ENCOUNTER — Ambulatory Visit (INDEPENDENT_AMBULATORY_CARE_PROVIDER_SITE_OTHER): Payer: Medicare HMO | Admitting: Cardiovascular Disease

## 2015-11-14 ENCOUNTER — Encounter: Payer: Self-pay | Admitting: Cardiovascular Disease

## 2015-11-14 VITALS — BP 130/80 | HR 91 | Ht 73.25 in | Wt 256.0 lb

## 2015-11-14 DIAGNOSIS — I4891 Unspecified atrial fibrillation: Secondary | ICD-10-CM | POA: Diagnosis not present

## 2015-11-14 DIAGNOSIS — I481 Persistent atrial fibrillation: Secondary | ICD-10-CM | POA: Diagnosis not present

## 2015-11-14 DIAGNOSIS — I4819 Other persistent atrial fibrillation: Secondary | ICD-10-CM

## 2015-11-14 MED ORDER — APIXABAN 5 MG PO TABS: 5.0000 mg | ORAL_TABLET | Freq: Two times a day (BID) | ORAL | 3 refills | Status: DC

## 2015-11-14 NOTE — Patient Instructions (Addendum)
Medication Instructions:  START ELIQUIS 5 mg - Take 2 times daily   Labwork: Your physician recommends that you return for lab work in: [place] BMET   Testing/Procedures: Your physician has requested that you have an echocardiogram. Echocardiography is a painless test that uses sound waves to create images of your heart. It provides your doctor with information about the size and shape of your heart and how well your heart's chambers and valves are working. This procedure takes approximately one hour. There are no restrictions for this procedure.  Your physician has recommended that you wear a holter monitor. Holter monitors are medical devices that record the heart's electrical activity. Doctors most often use these monitors to diagnose arrhythmias. Arrhythmias are problems with the speed or rhythm of the heartbeat. The monitor is a small, portable device. You can wear one while you do your normal daily activities. This is usually used to diagnose what is causing palpitations/syncope (passing out).** 24 hour **     Follow-Up: Your physician recommends that you schedule a follow-up appointment in: 3 weeks in [place]    Any Other Special Instructions Will Be Listed Below (If Applicable).     If you need a refill on your cardiac medications before your next appointment, please call your pharmacy.

## 2015-11-15 LAB — CBC WITH DIFFERENTIAL/PLATELET
BASOS: 0 %
Basophils Absolute: 0 10*3/uL (ref 0.0–0.2)
EOS (ABSOLUTE): 0.5 10*3/uL — ABNORMAL HIGH (ref 0.0–0.4)
EOS: 5 %
HEMATOCRIT: 48.9 % (ref 37.5–51.0)
HEMOGLOBIN: 16.3 g/dL (ref 12.6–17.7)
IMMATURE GRANS (ABS): 0.1 10*3/uL (ref 0.0–0.1)
Immature Granulocytes: 1 %
LYMPHS ABS: 2.7 10*3/uL (ref 0.7–3.1)
LYMPHS: 25 %
MCH: 31.8 pg (ref 26.6–33.0)
MCHC: 33.3 g/dL (ref 31.5–35.7)
MCV: 96 fL (ref 79–97)
MONOCYTES: 9 %
Monocytes Absolute: 1 10*3/uL — ABNORMAL HIGH (ref 0.1–0.9)
NEUTROS ABS: 6.5 10*3/uL (ref 1.4–7.0)
Neutrophils: 60 %
Platelets: 135 10*3/uL — ABNORMAL LOW (ref 150–379)
RBC: 5.12 x10E6/uL (ref 4.14–5.80)
RDW: 13.3 % (ref 12.3–15.4)
WBC: 10.8 10*3/uL (ref 3.4–10.8)

## 2015-11-15 LAB — BASIC METABOLIC PANEL
BUN / CREAT RATIO: 14 (ref 10–24)
BUN: 12 mg/dL (ref 8–27)
CO2: 25 mmol/L (ref 18–29)
CREATININE: 0.83 mg/dL (ref 0.76–1.27)
Calcium: 9.6 mg/dL (ref 8.6–10.2)
Chloride: 97 mmol/L (ref 96–106)
GFR calc Af Amer: 102 mL/min/{1.73_m2} (ref >59)
GFR, EST NON AFRICAN AMERICAN: 88 mL/min/{1.73_m2} (ref >59)
Glucose: 96 mg/dL (ref 65–99)
Potassium: 4.2 mmol/L (ref 3.5–5.2)
SODIUM: 141 mmol/L (ref 134–144)

## 2015-11-23 ENCOUNTER — Ambulatory Visit (HOSPITAL_COMMUNITY)
Admission: RE | Admit: 2015-11-23 | Discharge: 2015-11-23 | Disposition: A | Discharge status: Home / Self Care | Payer: Medicare HMO | Source: Ambulatory Visit | Attending: Cardiovascular Disease | Admitting: Cardiovascular Disease

## 2015-11-23 DIAGNOSIS — I340 Nonrheumatic mitral (valve) insufficiency: Secondary | ICD-10-CM | POA: Insufficient documentation

## 2015-11-23 DIAGNOSIS — I4891 Unspecified atrial fibrillation: Secondary | ICD-10-CM | POA: Insufficient documentation

## 2015-11-23 LAB — ECHOCARDIOGRAM COMPLETE
CHL CUP TV REG PEAK VELOCITY: 260 cm/s
EWDT: 162 ms
FS: 20 % — AB (ref 28–44)
IV/PV OW: 1.02
LA diam end sys: 44 mm
LA vol index: 36.7 mL/m2
LADIAMINDEX: 1.77 cm/m2
LASIZE: 44 mm
LAVOL: 91.3 mL
LAVOLA4C: 96.2 mL
LV PW d: 10.6 mm — AB (ref 0.6–1.1)
LV sys vol index: 10 mL/m2
LVDIAVOL: 69 mL (ref 62–150)
LVDIAVOLIN: 28 mL/m2
LVOT SV: 51 mL
LVOT VTI: 14.8 cm
LVOT area: 3.46 cm2
LVOT peak grad rest: 3 mmHg
LVOT peak vel: 79.7 cm/s
LVOTD: 21 mm
LVSYSVOL: 25 mL (ref 21–61)
MV Dec: 162
MV pk E vel: 86 m/s
MVPG: 3 mmHg
RV sys press: 30 mmHg
Simpson's disk: 64
Stroke v: 44 ml
TAPSE: 18.4 mm
TRMAXVEL: 260 cm/s

## 2015-11-23 NOTE — Progress Notes (Signed)
*  PRELIMINARY RESULTS* Echocardiogram 2D Echocardiogram has been performed.  Curtis [name] 11/23/2015, 1:39 PM

## 2015-11-27 ENCOUNTER — Telehealth: Payer: Self-pay | Admitting: Cardiovascular Disease

## 2015-11-27 NOTE — Telephone Encounter (Signed)
Pt is wondering if he can get some samples of the Eliquis, he can't afford the copay for it at the pharmacy. Please give him a call @ [phone], ok to leave message on answering machine if he doesn't answer

## 2015-11-27 NOTE — Telephone Encounter (Signed)
2 boxes lot [id number], exp 03/2018 given,lm for pt

## 2015-11-29 ENCOUNTER — Telehealth: Payer: Self-pay | Admitting: *Deleted

## 2015-11-29 ENCOUNTER — Other Ambulatory Visit: Payer: Self-pay | Admitting: Family Medicine

## 2015-11-29 ENCOUNTER — Telehealth: Payer: Self-pay

## 2015-11-29 MED ORDER — CARVEDILOL 3.125 MG PO TABS: 3.1250 mg | ORAL_TABLET | Freq: Two times a day (BID) | ORAL | 3 refills | Status: DC

## 2015-11-29 NOTE — Telephone Encounter (Signed)
Called pt, no answer- left message to return call. Sent in rx for coreg 3.125 two times daily.

## 2015-11-29 NOTE — Telephone Encounter (Signed)
-----   Message from Josue Hector, MD sent at 11/29/2015  8:54 AM EST ----- afib average HR over 100 start coreg 3.125 bid

## 2015-11-29 NOTE — Telephone Encounter (Signed)
Called patient with test results. No answer. Left message to call back.  

## 2015-12-06 NOTE — Progress Notes (Signed)
Cardiology Office Note   Date:  12/07/2015   ID:  Curtis [name], DOB [birth year], MRN [id number]  PCP:  Sallee Lange, MD  Cardiologist:   Jenkins Rouge, MD   Chief Complaint  Patient presents with  . Atrial Fibrillation      History of Present Illness: Curtis [name] is a 70 y.o. male who presents for evaluation of afib. New onset Since July by ECG at Dr Lance Sell office. History of COPD and chronic interstitial  Lung dx with dyspnea and fatigue  Seen by cardiology 2013 and had normal myovue study with EF 61%  Had atypical chest pain. History of HTN and elevated lipids but  Intolerant to statins  Having some issues affording albuterol for his COPD Sees Wynn Maudlin at [facility] for [place] and lung disease   Started on Eliquis 11/14/15    Holter 11/23/15 average HR over 100 started on coreg Echo: 11/23/15 EF 55% mild AS mild MR LAE moderately dilated 44 mm  Past Medical History:  Diagnosis Date  . Arthritis   . Asthma   . Cervical spondylosis   . Chest pain    Emergency room October 24, 2011, no MI  //   Nuclear, October, 2013, adenosine, EF 61%, no scar or ischemia  . Dyslipidemia    Statin intolerant  . GERD (gastroesophageal reflux disease)   . Glaucoma   . Hearing loss   . Hypertension   . Idiopathic pulmonary fibrosis ([place])   . Interstitial pulmonary fibrosis ([place]) 2012  . Left shoulder pain 2011  . Psoriasis   . Statin intolerance     Past Surgical History:  Procedure Laterality Date  . BRAIN SURGERY    . CATARACT EXTRACTION W/PHACO Right 06/11/2012   Procedure: CATARACT EXTRACTION PHACO AND INTRAOCULAR LENS PLACEMENT (IOC);  Surgeon: Tonny Branch, MD;  Location: [facility];  Service: Ophthalmology;  Laterality: Right;  CDE:  14.43  . CATARACT EXTRACTION W/PHACO Left 06/29/2012   Procedure: CATARACT EXTRACTION PHACO AND INTRAOCULAR LENS PLACEMENT (IOC);  Surgeon: Tonny Branch, MD;  Location: [facility];  Service: Ophthalmology;  Laterality: Left;  CDE:14.11  .  CHOLECYSTECTOMY  05/08/2012  . CHOLECYSTECTOMY N/A 05/08/2012   Procedure: LAPAROSCOPIC CHOLECYSTECTOMY;  Surgeon: Harl Bowie, MD;  Location: [place];  Service: General;  Laterality: N/A;  . COLONOSCOPY    . LIVER BIOPSY N/A 05/08/2012   Procedure: LIVER BIOPSY;  Surgeon: Harl Bowie, MD;  Location: [place];  Service: General;  Laterality: N/A;  . NECK SURGERY     cervical disc with cliip     Current Outpatient Prescriptions  Medication Sig Dispense Refill  . albuterol (PROVENTIL HFA;VENTOLIN HFA) 108 (90 Base) MCG/ACT inhaler Inhale 2 puffs into the lungs every 6 (six) hours as needed for wheezing. 1 Inhaler 2  . apixaban (ELIQUIS) 5 MG TABS tablet Take 1 tablet (5 mg total) by mouth 2 (two) times daily. 60 tablet 3  . bisacodyl (DUCODYL) 5 MG EC tablet Take 5 mg by mouth daily as needed for moderate constipation.    . hydrochlorothiazide (HYDRODIURIL) 25 MG tablet TAKE 1 TABLET(25 MG) BY MOUTH DAILY 90 tablet 0  . HYDROcodone-acetaminophen (NORCO) 10-325 MG tablet Take 1 tablet by mouth 3 (three) times daily as needed. 90 tablet 0  . latanoprost (XALATAN) 0.005 % ophthalmic solution Place 1 drop into both eyes at bedtime. Reported on 07/21/2015    . omeprazole (PRILOSEC) 20 MG capsule Take 20 mg by mouth daily as needed. Reported on 07/21/2015    .  predniSONE (DELTASONE) 10 MG tablet Take 10 mg by mouth daily with breakfast.     No current facility-administered medications for this visit.     Allergies:   Oxycodone; Pravastatin; Simvastatin; Prednisone; and Tegretol [carbamazepine]    Social History:  The patient  reports that he quit smoking about 12 years ago. His smoking use included Cigarettes. He has a 26.00 pack-year smoking history. He has never used smokeless tobacco. He reports that he does not drink alcohol or use drugs.   Family History:  The patient's family history includes COPD in his mother; Cancer in his sister; Congestive Heart Failure in his mother; Hypertension  in his mother; Stroke in his father.    ROS:  Please see the history of present illness.   Otherwise, review of systems are positive for none.   All other systems are reviewed and negative.    PHYSICAL EXAM: VS:  BP 130/60   Pulse (!) 110   Ht 6\' 1"  (1.854 m)   Wt 117.4 kg (258 lb 12.8 oz)   SpO2 96%   BMI 34.14 kg/m  , BMI Body mass index is 34.14 kg/m. Affect appropriate Healthy:  appears stated age 60: normal Neck supple with no adenopathy JVP normal no bruits no thyromegaly Lungs Inspitory crackles and exp wheezing  Heart:  S1/S2 no murmur, no rub, gallop or click PMI normal Abdomen: benighn, BS positve, no tenderness, no AAA no bruit.  No HSM or HJR Distal pulses intact with no bruits No edema Neuro non-focal Skin warm and dry No muscular weakness    EKG:  afib normal ST segments 10/23/15  07/24/15 SR rate 90 afib new since July    Recent Labs: 07/21/2015: ALT 25; Hemoglobin 16.1 11/14/2015: BUN 12; Creatinine, Ser 0.83; Platelets 135; Potassium 4.2; Sodium 141    Lipid Panel    Component Value Date/Time   CHOL 145 06/13/2015 0728   TRIG 101 06/13/2015 0728   HDL 33 (L) 06/13/2015 0728   CHOLHDL 4.4 06/13/2015 0728   VLDL 20 06/13/2015 0728   LDLCALC 92 06/13/2015 0728      Wt Readings from Last 3 Encounters:  12/07/15 117.4 kg (258 lb 12.8 oz)  11/14/15 116.1 kg (256 lb)  10/23/15 116.2 kg (256 lb 2 oz)      Other studies Reviewed: Additional studies/ records that were reviewed today include: Notes cardiology 2013 Notes [facility] pulmonary Dr Wolfgang Phoenix Notes and ECG .    ASSESSMENT AND PLAN:  1.  Afib:  On Eliquis since 11/7 rate control with coreg Moderate LAE by echo   Coreg Makes him sick to his stomach and has not taken it Called in Cardizem 240 mg for rate Control Discussed [facility] will arrange for next [place] has not missed a dose of eliquis Samples given.  Risks including stroke and need for pacer and intubation discussed willing To proceed.  Will get pre [facility] labs at [facility] Tuesday  2 ILD:  F/u [facility] off steroids major limiting factor in regard to activity  3. HTN:  Well controlled.  Continue current medications and low sodium Dash type diet.      Current medicines are reviewed at length with the patient today.  The patient does not have concerns regarding medicines.  The following changes have been made:  Cardizem CD 240 mg   Labs/ tests ordered today include: CBC PLT BMET  No orders of the defined types were placed in this encounter.    Disposition:   FU with me  post [facility]      Signed, Jenkins Rouge, MD  12/07/2015 2:58 PM    [facility], [place], [place]  [address] Phone: [phone]; Fax: [phone]

## 2015-12-07 ENCOUNTER — Ambulatory Visit (INDEPENDENT_AMBULATORY_CARE_PROVIDER_SITE_OTHER): Payer: Medicare HMO | Admitting: Cardiovascular Disease

## 2015-12-07 ENCOUNTER — Telehealth: Payer: Self-pay | Admitting: Cardiovascular Disease

## 2015-12-07 ENCOUNTER — Encounter: Payer: Self-pay | Admitting: Cardiovascular Disease

## 2015-12-07 ENCOUNTER — Other Ambulatory Visit: Payer: Self-pay | Admitting: Cardiovascular Disease

## 2015-12-07 VITALS — BP 130/60 | HR 110 | Ht 73.0 in | Wt 258.8 lb

## 2015-12-07 DIAGNOSIS — I481 Persistent atrial fibrillation: Secondary | ICD-10-CM | POA: Diagnosis not present

## 2015-12-07 DIAGNOSIS — Z01812 Encounter for preprocedural laboratory examination: Secondary | ICD-10-CM | POA: Diagnosis not present

## 2015-12-07 DIAGNOSIS — I4819 Other persistent atrial fibrillation: Secondary | ICD-10-CM

## 2015-12-07 MED ORDER — DILTIAZEM HCL ER COATED BEADS 240 MG PO CP24: 240.0000 mg | ORAL_CAPSULE | Freq: Every day | ORAL | 3 refills | Status: DC

## 2015-12-07 NOTE — Patient Instructions (Addendum)
Medication Instructions:  Your physician has recommended you make the following change in your medication:  1-STOP Coreg 2-START Cardizem 240 mg by mouth daily.  Labwork: Your physician recommends that you return for lab work next Tuesday. BMET, CBC, INR/PT  Testing/Procedures: Your physician has recommended that you have a Cardioversion (DCCV). Electrical Cardioversion uses a jolt of electricity to your heart either through paddles or wired patches attached to your chest. This is a controlled, usually prescheduled, procedure. Defibrillation is done under light anesthesia in the hospital, and you usually go home the day of the procedure. This is done to get your heart back into a normal rhythm. You are not awake for the procedure. Please see the instruction sheet given to you today.  Follow-Up: Your physician wants you to follow-up in: 6 months with Dr. Johnsie Cancel. You will receive a reminder letter in the mail two months in advance. If you don't receive a letter, please call our office to schedule the follow-up appointment.   If you need a refill on your cardiac medications before your next appointment, please call your pharmacy.

## 2015-12-07 NOTE — Telephone Encounter (Signed)
Pt was in today and received a call from Dr. Kyla Balzarine nurse "Tammy"-pls call back -pt home now

## 2015-12-07 NOTE — Telephone Encounter (Signed)
Called patient to let him know that his lab orders should be with [facility]. Informed patient to give our office a call if orders need to be faxed to [facility]. Patient verbalized understanding.

## 2015-12-07 NOTE — Addendum Note (Signed)
Addended by: Aris Georgia, Britton Perkinson L on: 12/07/2015 03:21 PM   Modules accepted: Orders

## 2015-12-13 ENCOUNTER — Encounter (HOSPITAL_COMMUNITY): Payer: Self-pay | Admitting: Critical Care Medicine

## 2015-12-13 ENCOUNTER — Ambulatory Visit (HOSPITAL_COMMUNITY): Payer: Medicare HMO | Admitting: Critical Care Medicine

## 2015-12-13 ENCOUNTER — Ambulatory Visit (HOSPITAL_COMMUNITY)
Admission: RE | Admit: 2015-12-13 | Discharge: 2015-12-13 | Disposition: A | Discharge status: Home / Self Care | Payer: Medicare HMO | Source: Ambulatory Visit | Attending: Cardiovascular Disease | Admitting: Cardiovascular Disease

## 2015-12-13 ENCOUNTER — Encounter (HOSPITAL_COMMUNITY)
Admission: RE | Disposition: A | Discharge status: Home / Self Care | Payer: Self-pay | Source: Ambulatory Visit | Attending: Cardiovascular Disease

## 2015-12-13 DIAGNOSIS — J84112 Idiopathic pulmonary fibrosis: Secondary | ICD-10-CM | POA: Diagnosis not present

## 2015-12-13 DIAGNOSIS — I4891 Unspecified atrial fibrillation: Secondary | ICD-10-CM | POA: Insufficient documentation

## 2015-12-13 DIAGNOSIS — E785 Hyperlipidemia, unspecified: Secondary | ICD-10-CM | POA: Insufficient documentation

## 2015-12-13 DIAGNOSIS — L409 Psoriasis, unspecified: Secondary | ICD-10-CM | POA: Insufficient documentation

## 2015-12-13 DIAGNOSIS — Z87891 Personal history of nicotine dependence: Secondary | ICD-10-CM | POA: Diagnosis not present

## 2015-12-13 DIAGNOSIS — M1990 Unspecified osteoarthritis, unspecified site: Secondary | ICD-10-CM | POA: Diagnosis not present

## 2015-12-13 DIAGNOSIS — H409 Unspecified glaucoma: Secondary | ICD-10-CM | POA: Diagnosis not present

## 2015-12-13 DIAGNOSIS — K219 Gastro-esophageal reflux disease without esophagitis: Secondary | ICD-10-CM | POA: Insufficient documentation

## 2015-12-13 DIAGNOSIS — Z7901 Long term (current) use of anticoagulants: Secondary | ICD-10-CM | POA: Diagnosis not present

## 2015-12-13 DIAGNOSIS — Z79899 Other long term (current) drug therapy: Secondary | ICD-10-CM | POA: Insufficient documentation

## 2015-12-13 DIAGNOSIS — I10 Essential (primary) hypertension: Secondary | ICD-10-CM | POA: Diagnosis not present

## 2015-12-13 HISTORY — PX: CARDIOVERSION: SHX1299

## 2015-12-13 LAB — CBC WITH DIFFERENTIAL/PLATELET
BASOS ABS: 0 10*3/uL (ref 0.0–0.2)
Basos: 0 %
EOS (ABSOLUTE): 0.2 10*3/uL (ref 0.0–0.4)
EOS: 2 %
HEMATOCRIT: 46.8 % (ref 37.5–51.0)
HEMOGLOBIN: 15.3 g/dL (ref 13.0–17.7)
IMMATURE GRANS (ABS): 0.1 10*3/uL (ref 0.0–0.1)
IMMATURE GRANULOCYTES: 1 %
LYMPHS ABS: 1.3 10*3/uL (ref 0.7–3.1)
LYMPHS: 12 %
MCH: 31.1 pg (ref 26.6–33.0)
MCHC: 32.7 g/dL (ref 31.5–35.7)
MCV: 95 fL (ref 79–97)
Monocytes Absolute: 0.6 10*3/uL (ref 0.1–0.9)
Monocytes: 6 %
Neutrophils Absolute: 8.8 10*3/uL — ABNORMAL HIGH (ref 1.4–7.0)
Neutrophils: 79 %
Platelets: 128 10*3/uL — ABNORMAL LOW (ref 150–379)
RBC: 4.92 x10E6/uL (ref 4.14–5.80)
RDW: 13.2 % (ref 12.3–15.4)
WBC: 11 10*3/uL — AB (ref 3.4–10.8)

## 2015-12-13 LAB — PROTIME-INR
INR: 1.1 (ref 0.8–1.2)
Prothrombin Time: 11.6 s (ref 9.1–12.0)

## 2015-12-13 LAB — BRAIN NATRIURETIC PEPTIDE: BNP: 115.1 pg/mL — ABNORMAL HIGH (ref 0.0–100.0)

## 2015-12-13 SURGERY — CARDIOVERSION
Anesthesia: General

## 2015-12-13 MED ORDER — LIDOCAINE 2% (20 MG/ML) 5 ML SYRINGE
INTRAMUSCULAR | Status: DC | PRN
  Administered 2015-12-13: 60 mg via INTRAVENOUS

## 2015-12-13 MED ORDER — SODIUM CHLORIDE 0.9 % IV SOLN
INTRAVENOUS | Status: DC
  Administered 2015-12-13: 13:00:00 via INTRAVENOUS

## 2015-12-13 MED ORDER — PROPOFOL 10 MG/ML IV BOLUS
INTRAVENOUS | Status: DC | PRN
  Administered 2015-12-13: 70 mg via INTRAVENOUS

## 2015-12-13 MED ORDER — SODIUM CHLORIDE 0.45 % IV SOLN: INTRAVENOUS | Status: DC

## 2015-12-13 NOTE — Anesthesia Procedure Notes (Signed)
Date/Time: 12/13/2015 12:45 PM Performed by: Merrilyn Puma B Pre-anesthesia Checklist: Patient identified, Emergency Drugs available, Suction available, Patient being monitored and Timeout performed Patient Re-evaluated:Patient Re-evaluated prior to inductionOxygen Delivery Method: Ambu bag Intubation Type: IV induction Placement Confirmation: breath sounds checked- equal and bilateral Dental Injury: Teeth and Oropharynx as per pre-operative assessment

## 2015-12-13 NOTE — Anesthesia Preprocedure Evaluation (Signed)
Anesthesia Evaluation  Patient identified by MRN, date of birth, ID band Patient awake    Reviewed: Allergy & Precautions, NPO status , Patient's Chart, lab work & pertinent test results  Airway Mallampati: II       Dental  (+) Dental Advisory Given, Teeth Intact, Missing   Pulmonary asthma , former smoker,           Cardiovascular hypertension, Pt. on medications      Neuro/Psych    GI/Hepatic GERD  ,  Endo/Other    Renal/GU      Musculoskeletal  (+) Arthritis ,   Abdominal   Peds  Hematology   Anesthesia Other Findings   Reproductive/Obstetrics                             Anesthesia Physical Anesthesia Plan  ASA: III  Anesthesia Plan: General   Post-op Pain Management:    Induction: Intravenous  Airway Management Planned:   Additional Equipment:   Intra-op Plan:   Post-operative Plan:   Informed Consent: I have reviewed the patients History and Physical, chart, labs and discussed the procedure including the risks, benefits and alternatives for the proposed anesthesia with the patient or authorized representative who has indicated his/her understanding and acceptance.   Dental advisory given  Plan Discussed with: Anesthesiologist and Surgeon  Anesthesia Plan Comments:         Anesthesia Quick Evaluation

## 2015-12-13 NOTE — Interval H&P Note (Signed)
History and Physical Interval Note:  12/13/2015 12:56 PM  Curtis [name]  has presented today for surgery, with the diagnosis of AFIB  The various methods of treatment have been discussed with the patient and family. After consideration of risks, benefits and other options for treatment, the patient has consented to  Procedure(s): CARDIOVERSION (N/A) as a surgical intervention .  The patient's history has been reviewed, patient examined, no change in status, stable for surgery.  I have reviewed the patient's chart and labs.  Questions were answered to the patient's satisfaction.     Jenkins Rouge

## 2015-12-13 NOTE — Discharge Instructions (Signed)
Electrical Cardioversion, Care After °This sheet gives you information about how to care for yourself after your procedure. Your health care provider may also give you more specific instructions. If you have problems or questions, contact your health care provider. °What can I expect after the procedure? °After the procedure, it is common to have: °· Some redness on the skin where the shocks were given. °Follow these instructions at home: °· Do not drive for 24 hours if you were given a medicine to help you relax (sedative). °· Take over-the-counter and prescription medicines only as told by your health care provider. °· Ask your health care provider how to check your pulse. Check it often. °· Rest for 48 hours after the procedure or as told by your health care provider. °· Avoid or limit your caffeine use as told by your health care provider. °Contact a health care provider if: °· You feel like your heart is beating too quickly or your pulse is not regular. °· You have a serious muscle cramp that does not go away. °Get help right away if: °· You have discomfort in your chest. °· You are dizzy or you feel faint. °· You have trouble breathing or you are short of breath. °· Your speech is slurred. °· You have trouble moving an arm or leg on one side of your body. °· Your fingers or toes turn cold or blue. °This information is not intended to replace advice given to you by your health care provider. Make sure you discuss any questions you have with your health care provider. °Document Released: 10/14/2012 Document Revised: 07/28/2015 Document Reviewed: 06/30/2015 °Elsevier Interactive Patient Education © 2017 [employer] ° °

## 2015-12-13 NOTE — Anesthesia Preprocedure Evaluation (Signed)
Anesthesia Evaluation  Patient identified by MRN, date of birth, ID band Patient awake    Reviewed: Allergy & Precautions, NPO status , Patient's Chart, lab work & pertinent test results  Airway Mallampati: II  TM Distance: >3 FB Neck ROM: Full    Dental no notable dental hx.    Pulmonary former smoker,  IPF   Pulmonary exam normal breath sounds clear to auscultation       Cardiovascular hypertension, Normal cardiovascular exam+ dysrhythmias Atrial Fibrillation  Rhythm:Regular Rate:Normal     Neuro/Psych negative neurological ROS  negative psych ROS   GI/Hepatic negative GI ROS, Neg liver ROS,   Endo/Other  negative endocrine ROS  Renal/GU negative Renal ROS  negative genitourinary   Musculoskeletal negative musculoskeletal ROS (+)   Abdominal   Peds negative pediatric ROS (+)  Hematology negative hematology ROS (+)   Anesthesia Other Findings   Reproductive/Obstetrics negative OB ROS                             Anesthesia Physical Anesthesia Plan  ASA: III  Anesthesia Plan: General   Post-op Pain Management:    Induction: Intravenous  Airway Management Planned: Mask  Additional Equipment:   Intra-op Plan:   Post-operative Plan:   Informed Consent: I have reviewed the patients History and Physical, chart, labs and discussed the procedure including the risks, benefits and alternatives for the proposed anesthesia with the patient or authorized representative who has indicated his/her understanding and acceptance.   Dental advisory given  Plan Discussed with: CRNA and Surgeon  Anesthesia Plan Comments:         Anesthesia Quick Evaluation

## 2015-12-13 NOTE — CV Procedure (Signed)
[facility]: Lidocaine 60 mg Propofol 70 mg Dr Kalman Shan  [facility] x 2 120 J then 150 J Converted from afib rate 88 to NSR rates 70's   On Rx Eliquis  No immediate neurologic sequelae  Jenkins Rouge

## 2015-12-13 NOTE — H&P (View-Only) (Signed)
Cardiology Office Note   Date:  12/07/2015   ID:  Curtis [name], DOB 03-07-1945, MRN [id number]  PCP:  Sallee Lange, MD  Cardiologist:   Jenkins Rouge, MD   Chief Complaint  Patient presents with  . Atrial Fibrillation      History of Present Illness: Curtis [name] is a 70 y.o. male who presents for evaluation of afib. New onset Since July by ECG at Dr Lance Sell office. History of COPD and chronic interstitial  Lung dx with dyspnea and fatigue  Seen by cardiology 2013 and had normal myovue study with EF 61%  Had atypical chest pain. History of HTN and elevated lipids but  Intolerant to statins  Having some issues affording albuterol for his COPD Sees Wynn Maudlin at [facility] for [place] and lung disease   Started on Eliquis 11/14/15    Holter 11/23/15 average HR over 100 started on coreg Echo: 11/23/15 EF 55% mild AS mild MR LAE moderately dilated 44 mm  Past Medical History:  Diagnosis Date  . Arthritis   . Asthma   . Cervical spondylosis   . Chest pain    Emergency room October 24, 2011, no MI  //   Nuclear, October, 2013, adenosine, EF 61%, no scar or ischemia  . Dyslipidemia    Statin intolerant  . GERD (gastroesophageal reflux disease)   . Glaucoma   . Hearing loss   . Hypertension   . Idiopathic pulmonary fibrosis ([place])   . Interstitial pulmonary fibrosis ([place]) 2012  . Left shoulder pain 2011  . Psoriasis   . Statin intolerance     Past Surgical History:  Procedure Laterality Date  . BRAIN SURGERY    . CATARACT EXTRACTION W/PHACO Right 06/11/2012   Procedure: CATARACT EXTRACTION PHACO AND INTRAOCULAR LENS PLACEMENT (IOC);  Surgeon: Tonny Branch, MD;  Location: [facility];  Service: Ophthalmology;  Laterality: Right;  CDE:  14.43  . CATARACT EXTRACTION W/PHACO Left 06/29/2012   Procedure: CATARACT EXTRACTION PHACO AND INTRAOCULAR LENS PLACEMENT (IOC);  Surgeon: Tonny Branch, MD;  Location: [facility];  Service: Ophthalmology;  Laterality: Left;  CDE:14.11  .  CHOLECYSTECTOMY  05/08/2012  . CHOLECYSTECTOMY N/A 05/08/2012   Procedure: LAPAROSCOPIC CHOLECYSTECTOMY;  Surgeon: Harl Bowie, MD;  Location: [place];  Service: General;  Laterality: N/A;  . COLONOSCOPY    . LIVER BIOPSY N/A 05/08/2012   Procedure: LIVER BIOPSY;  Surgeon: Harl Bowie, MD;  Location: [place];  Service: General;  Laterality: N/A;  . NECK SURGERY     cervical disc with cliip     Current Outpatient Prescriptions  Medication Sig Dispense Refill  . albuterol (PROVENTIL HFA;VENTOLIN HFA) 108 (90 Base) MCG/ACT inhaler Inhale 2 puffs into the lungs every 6 (six) hours as needed for wheezing. 1 Inhaler 2  . apixaban (ELIQUIS) 5 MG TABS tablet Take 1 tablet (5 mg total) by mouth 2 (two) times daily. 60 tablet 3  . bisacodyl (DUCODYL) 5 MG EC tablet Take 5 mg by mouth daily as needed for moderate constipation.    . hydrochlorothiazide (HYDRODIURIL) 25 MG tablet TAKE 1 TABLET(25 MG) BY MOUTH DAILY 90 tablet 0  . HYDROcodone-acetaminophen (NORCO) 10-325 MG tablet Take 1 tablet by mouth 3 (three) times daily as needed. 90 tablet 0  . latanoprost (XALATAN) 0.005 % ophthalmic solution Place 1 drop into both eyes at bedtime. Reported on 07/21/2015    . omeprazole (PRILOSEC) 20 MG capsule Take 20 mg by mouth daily as needed. Reported on 07/21/2015    .  predniSONE (DELTASONE) 10 MG tablet Take 10 mg by mouth daily with breakfast.     No current facility-administered medications for this visit.     Allergies:   Oxycodone; Pravastatin; Simvastatin; Prednisone; and Tegretol [carbamazepine]    Social History:  The patient  reports that he quit smoking about 12 years ago. His smoking use included Cigarettes. He has a 26.00 pack-year smoking history. He has never used smokeless tobacco. He reports that he does not drink alcohol or use drugs.   Family History:  The patient's family history includes COPD in his mother; Cancer in his sister; Congestive Heart Failure in his mother; Hypertension  in his mother; Stroke in his father.    ROS:  Please see the history of present illness.   Otherwise, review of systems are positive for none.   All other systems are reviewed and negative.    PHYSICAL EXAM: VS:  BP 130/60   Pulse (!) 110   Ht 6\' 1"  (1.854 m)   Wt 117.4 kg (258 lb 12.8 oz)   SpO2 96%   BMI 34.14 kg/m  , BMI Body mass index is 34.14 kg/m. Affect appropriate Healthy:  appears stated age 76: normal Neck supple with no adenopathy JVP normal no bruits no thyromegaly Lungs Inspitory crackles and exp wheezing  Heart:  S1/S2 no murmur, no rub, gallop or click PMI normal Abdomen: benighn, BS positve, no tenderness, no AAA no bruit.  No HSM or HJR Distal pulses intact with no bruits No edema Neuro non-focal Skin warm and dry No muscular weakness    EKG:  afib normal ST segments 10/23/15  07/24/15 SR rate 90 afib new since July    Recent Labs: 07/21/2015: ALT 25; Hemoglobin 16.1 11/14/2015: BUN 12; Creatinine, Ser 0.83; Platelets 135; Potassium 4.2; Sodium 141    Lipid Panel    Component Value Date/Time   CHOL 145 06/13/2015 0728   TRIG 101 06/13/2015 0728   HDL 33 (L) 06/13/2015 0728   CHOLHDL 4.4 06/13/2015 0728   VLDL 20 06/13/2015 0728   LDLCALC 92 06/13/2015 0728      Wt Readings from Last 3 Encounters:  12/07/15 117.4 kg (258 lb 12.8 oz)  11/14/15 116.1 kg (256 lb)  10/23/15 116.2 kg (256 lb 2 oz)      Other studies Reviewed: Additional studies/ records that were reviewed today include: Notes cardiology 2013 Notes [facility] pulmonary Dr Wolfgang Phoenix Notes and ECG .    ASSESSMENT AND PLAN:  1.  Afib:  On Eliquis since 11/7 rate control with coreg Moderate LAE by echo   Coreg Makes him sick to his stomach and has not taken it Called in Cardizem 240 mg for rate Control Discussed [facility] will arrange for next [place] has not missed a dose of eliquis Samples given.  Risks including stroke and need for pacer and intubation discussed willing To proceed.  Will get pre [facility] labs at [facility] Tuesday  2 ILD:  F/u [facility] off steroids major limiting factor in regard to activity  3. HTN:  Well controlled.  Continue current medications and low sodium Dash type diet.      Current medicines are reviewed at length with the patient today.  The patient does not have concerns regarding medicines.  The following changes have been made:  Cardizem CD 240 mg   Labs/ tests ordered today include: CBC PLT BMET  No orders of the defined types were placed in this encounter.    Disposition:   FU with me  post [facility]      Signed, Jenkins Rouge, MD  12/07/2015 2:58 PM    [address], [place], [place]  [address] Phone: [phone]; Fax: [phone]

## 2015-12-13 NOTE — Transfer of Care (Signed)
Immediate Anesthesia Transfer of Care Note  Patient: Curtis [name]  Procedure(s) Performed: Procedure(s): CARDIOVERSION (N/A)  Patient Location: Endoscopy Unit  Anesthesia Type:General  Level of Consciousness: awake, alert  and oriented  Airway & Oxygen Therapy: Patient Spontanous Breathing  Post-op Assessment: Report given to RN and Post -op Vital signs reviewed and stable  Post vital signs: Reviewed and stable  Last Vitals:  Vitals:   12/13/15 1154  BP: (!) 148/90  Pulse: 80  Resp: 13  Temp: 36.9 C    Last Pain:  Vitals:   12/13/15 1154  TempSrc: Oral         Complications: No apparent anesthesia complications

## 2015-12-13 NOTE — Anesthesia Postprocedure Evaluation (Signed)
Anesthesia Post Note  Patient: Curtis [name]  Procedure(s) Performed: Procedure(s) (LRB): CARDIOVERSION (N/A)  Patient location during evaluation: PACU Anesthesia Type: General Level of consciousness: awake and alert Pain management: pain level controlled Vital Signs Assessment: post-procedure vital signs reviewed and stable Respiratory status: spontaneous breathing, nonlabored ventilation, respiratory function stable and patient connected to nasal cannula oxygen Cardiovascular status: blood pressure returned to baseline and stable Postop Assessment: no signs of nausea or vomiting Anesthetic complications: no    Last Vitals:  Vitals:   12/13/15 1154 12/13/15 1255  BP: (!) 148/90 136/84  Pulse: 80 72  Resp: 13 19  Temp: 36.9 C     Last Pain:  Vitals:   12/13/15 1255  TempSrc: Oral                 Levette Paulick S

## 2015-12-14 ENCOUNTER — Encounter (HOSPITAL_COMMUNITY): Payer: Self-pay | Admitting: Cardiovascular Disease

## 2015-12-29 ENCOUNTER — Ambulatory Visit (INDEPENDENT_AMBULATORY_CARE_PROVIDER_SITE_OTHER): Payer: Medicare HMO | Admitting: Adult Health

## 2015-12-29 ENCOUNTER — Encounter: Payer: Self-pay | Admitting: Adult Health

## 2015-12-29 VITALS — BP 146/86 | HR 80 | Ht 73.25 in | Wt 259.0 lb

## 2015-12-29 DIAGNOSIS — I10 Essential (primary) hypertension: Secondary | ICD-10-CM

## 2015-12-29 DIAGNOSIS — I4891 Unspecified atrial fibrillation: Secondary | ICD-10-CM | POA: Diagnosis not present

## 2015-12-29 NOTE — Progress Notes (Signed)
Name: Curtis [name]    DOB: [birth year]  Age: 70 y.o.  MR#: [id number]       PCP:  Sallee Lange, MD      Insurance: Payor: [place] [employer] MEDICARE / Plan: [employer] MEDICARE HMO/PPO / Product Type: *No Product type* /   CC:   No chief complaint on file.   VS Vitals:   12/29/15 1440  Pulse: 80  SpO2: 92%  Weight: 259 lb (117.5 kg)  Height: 6' 1.25" (1.861 m)    Weights Current Weight  12/29/15 259 lb (117.5 kg)  12/13/15 254 lb (115.2 kg)  12/07/15 258 lb 12.8 oz (117.4 kg)    Blood Pressure  BP Readings from Last 3 Encounters:  12/13/15 122/82  12/07/15 130/60  11/14/15 130/80     Admit date:  (Not on file) Last encounter with RMR:  Visit date not found   Allergy Oxycodone; Pravastatin; Simvastatin; Prednisone; and Tegretol [carbamazepine]  Current Outpatient Prescriptions  Medication Sig Dispense Refill  . albuterol (PROVENTIL HFA;VENTOLIN HFA) 108 (90 Base) MCG/ACT inhaler Inhale 2 puffs into the lungs every 6 (six) hours as needed for wheezing. 1 Inhaler 2  . apixaban (ELIQUIS) 5 MG TABS tablet Take 1 tablet (5 mg total) by mouth 2 (two) times daily. 60 tablet 3  . bisacodyl (DUCODYL) 5 MG EC tablet Take 5 mg by mouth daily as needed for moderate constipation.    Marland Kitchen diltiazem (CARDIZEM CD) 240 MG 24 hr capsule Take 1 capsule (240 mg total) by mouth daily. 90 capsule 3  . hydrochlorothiazide (HYDRODIURIL) 25 MG tablet TAKE 1 TABLET(25 MG) BY MOUTH DAILY 90 tablet 0  . HYDROcodone-acetaminophen (NORCO) 10-325 MG tablet Take 1 tablet by mouth 3 (three) times daily as needed. 90 tablet 0  . latanoprost (XALATAN) 0.005 % ophthalmic solution Place 1 drop into both eyes at bedtime. Reported on 07/21/2015    . omeprazole (PRILOSEC) 20 MG capsule Take 20 mg by mouth daily as needed. Reported on 07/21/2015    . predniSONE (DELTASONE) 10 MG tablet Take 10 mg by mouth daily with breakfast.     No current facility-administered medications for this visit.     Discontinued Meds:   There are  no discontinued medications.  Patient Active Problem List   Diagnosis Date Noted  . Bell's palsy 08/03/2015  . Insomnia 06/08/2015  . Right trigeminal neuralgia 04/01/2014  . Chronic back pain 03/01/2014  . Elevated transaminase level 06/25/2013  . Obesity (BMI 30-39.9) 09/09/2012  . Chronic neck pain 09/09/2012  . Symptomatic cholelithiasis 04/14/2012  . Chest pain   . Statin intolerance   . Dyslipidemia   . GERD (gastroesophageal reflux disease)   . Hypertension   . INTERSTITIAL LUNG DISEASE 03/16/2010  . PSORIASIS 03/16/2010    LABS    Component Value Date/Time   NA 141 11/14/2015 0930   NA 139 07/21/2015 1520   NA 140 06/13/2015 0728   NA 140 10/17/2014 0901   NA 141 03/01/2014 0940   K 4.2 11/14/2015 0930   K 3.7 07/21/2015 1520   K 3.7 06/13/2015 0728   CL 97 11/14/2015 0930   CL 104 07/21/2015 1520   CL 105 06/13/2015 0728   CO2 25 11/14/2015 0930   CO2 28 07/21/2015 1520   CO2 24 06/13/2015 0728   GLUCOSE 96 11/14/2015 0930   GLUCOSE 93 07/21/2015 1520   GLUCOSE 99 06/13/2015 0728   GLUCOSE 83 10/17/2014 0901   GLUCOSE 84 03/01/2014 0940   BUN 12 11/14/2015 0930  BUN 9 07/21/2015 1520   BUN 7 06/13/2015 0728   BUN 12 10/17/2014 0901   BUN 12 03/01/2014 0940   CREATININE 0.83 11/14/2015 0930   CREATININE 0.88 07/21/2015 1520   CREATININE 0.81 06/13/2015 0728   CREATININE 0.94 10/17/2014 0901   CREATININE 0.95 03/01/2014 0940   CREATININE 1.06 06/25/2013 1541   CALCIUM 9.6 11/14/2015 0930   CALCIUM 8.9 07/21/2015 1520   CALCIUM 9.0 06/13/2015 0728   GFRNONAA 88 11/14/2015 0930   GFRNONAA >60 07/21/2015 1520   GFRNONAA 81 10/17/2014 0901   GFRAA 102 11/14/2015 0930   GFRAA >60 07/21/2015 1520   GFRAA 94 10/17/2014 0901   CMP     Component Value Date/Time   NA 141 11/14/2015 0930   K 4.2 11/14/2015 0930   CL 97 11/14/2015 0930   CO2 25 11/14/2015 0930   GLUCOSE 96 11/14/2015 0930   GLUCOSE 93 07/21/2015 1520   BUN 12 11/14/2015 0930    CREATININE 0.83 11/14/2015 0930   CREATININE 0.81 06/13/2015 0728   CALCIUM 9.6 11/14/2015 0930   PROT 7.2 07/21/2015 1520   ALBUMIN 3.7 07/21/2015 1520   AST 31 07/21/2015 1520   ALT 25 07/21/2015 1520   ALKPHOS 49 07/21/2015 1520   BILITOT 2.5 (H) 07/21/2015 1520   GFRNONAA 88 11/14/2015 0930   GFRAA 102 11/14/2015 0930       Component Value Date/Time   WBC 11.0 (H) 12/12/2015 0821   WBC 10.8 11/14/2015 0930   WBC 7.5 07/21/2015 1520   WBC 9.3 06/13/2015 0728   WBC 10.1 06/25/2013 1541   HGB 16.1 07/21/2015 1520   HGB 14.9 06/13/2015 0728   HGB 16.3 06/25/2013 1541   HCT 46.8 12/12/2015 0821   HCT 48.9 11/14/2015 0930   HCT 48.1 07/21/2015 1520   HCT 44.1 06/13/2015 0728   HCT 47.1 06/25/2013 1541   MCV 95 12/12/2015 0821   MCV 96 11/14/2015 0930   MCV 94.3 07/21/2015 1520   MCV 93.8 06/13/2015 0728   MCV 89.0 06/25/2013 1541    Lipid Panel     Component Value Date/Time   CHOL 145 06/13/2015 0728   TRIG 101 06/13/2015 0728   HDL 33 (L) 06/13/2015 0728   CHOLHDL 4.4 06/13/2015 0728   VLDL 20 06/13/2015 0728   LDLCALC 92 06/13/2015 0728    ABG No results found for: PHART, PCO2ART, PO2ART, HCO3, TCO2, ACIDBASEDEF, O2SAT   No results found for: TSH BNP (last 3 results)  Recent Labs  12/12/15 0821  BNP 115.1*    ProBNP (last 3 results) No results for input(s): PROBNP in the last 8760 hours.  Cardiac Panel (last 3 results) No results for input(s): CKTOTAL, CKMB, TROPONINI, RELINDX in the last 72 hours.  Iron/TIBC/Ferritin/ %Sat No results found for: IRON, TIBC, FERRITIN, IRONPCTSAT   EKG Orders placed or performed in visit on 10/24/15  . EKG     Prior Assessment and Plan Problem List as of 12/29/2015 Reviewed: 12/13/2015 12:34 PM by Carola Frost, CRNA     Cardiovascular and Mediastinum   Hypertension   Last Assessment & Plan 10/30/2011 Office Visit Written 10/30/2011  2:12 PM by Carlena Bjornstad, MD    Blood pressure is controlled today. No  change in therapy.        Respiratory   INTERSTITIAL LUNG DISEASE   Last Assessment & Plan 10/30/2011 Office Visit Written 10/30/2011  2:11 PM by Carlena Bjornstad, MD    The patient does have significant lung  disease. No further workup by me at this time.        Digestive   GERD (gastroesophageal reflux disease)   Last Assessment & Plan 10/30/2011 Office Visit Written 10/30/2011  2:13 PM by Carlena Bjornstad, MD    I do think the patient is having some GERD symptoms. I've encouraged him to use omeprazole 30 minutes before breakfast and 30 minutes before supper for 2 full weeks.      Symptomatic cholelithiasis     Nervous and Auditory   Right trigeminal neuralgia   Bell's palsy     Musculoskeletal and Integument   PSORIASIS     Other   Statin intolerance   Last Assessment & Plan 10/30/2011 Office Visit Written 10/30/2011  2:12 PM by Carlena Bjornstad, MD    Patient has lipid abnormalities. Unfortunately he is statin intolerant. We still do not have a good proven drug for the treatment of low HDL.      Dyslipidemia   Chest pain   Obesity (BMI 30-39.9)   Chronic neck pain   Elevated transaminase level   Chronic back pain   Insomnia       Imaging: No results found.

## 2015-12-29 NOTE — Progress Notes (Signed)
Have him f/u in afib clinic sooner to possibly start antiarrhythmic

## 2015-12-29 NOTE — Progress Notes (Signed)
Cardiology Office Note   Date:  12/29/2015   ID:  Curtis [name], DOB 1945/05/12, MRN [id number]  PCP:  Sallee Lange, MD  Cardiologist:  Lamar Sprinkles, NP   Chief Complaint  Patient presents with  . Atrial Fibrillation     History of Present Illness: Curtis [name] is a 70 y.o. male who presents for post procedure follow-up after being seen by Dr. Johnsie Cancel, in the setting of atrial fibrillation with history of COPD, chronic interstitial lung disease, dyspnea and fatigue. Echocardiogram dated 11/23/2015 EF 55% mild left ear mild MR LAE moderately dilated at 44 mm.  The patient was scheduled for direct current cardioversion with Dr. Johnsie Cancel. This was completed on 12/13/2015, he was cardioverted using 120 J and then 150 J, and converted from atrial fibrillation to normal sinus rhythm in the 70s. There is no immediate neurologic sequelae. He is here for post procedure follow-up  He comes today with no reported palpitations racing heart rate or fatigue. He states that he could feel his heart rate being irregular and he had chronic fatigue before having had the cardioversion. He states his energy level is better. He has no complaints of bleeding or bruising or hemoptysis.  Past Medical History:  Diagnosis Date  . Arthritis   . Asthma   . Cervical spondylosis   . Chest pain    Emergency room October 24, 2011, no MI  //   Nuclear, October, 2013, adenosine, EF 61%, no scar or ischemia  . Dyslipidemia    Statin intolerant  . GERD (gastroesophageal reflux disease)   . Glaucoma   . Hearing loss   . Hypertension   . Idiopathic pulmonary fibrosis ([place])   . Interstitial pulmonary fibrosis ([place]) 2012  . Left shoulder pain 2011  . Psoriasis   . Statin intolerance     Past Surgical History:  Procedure Laterality Date  . BRAIN SURGERY    . CARDIOVERSION N/A 12/13/2015   Procedure: CARDIOVERSION;  Surgeon: Josue Hector, MD;  Location: [place];  Service: Cardiovascular;   Laterality: N/A;  . CATARACT EXTRACTION W/PHACO Right 06/11/2012   Procedure: CATARACT EXTRACTION PHACO AND INTRAOCULAR LENS PLACEMENT ([place]);  Surgeon: Tonny Branch, MD;  Location: [facility];  Service: Ophthalmology;  Laterality: Right;  CDE:  14.43  . CATARACT EXTRACTION W/PHACO Left 06/29/2012   Procedure: CATARACT EXTRACTION PHACO AND INTRAOCULAR LENS PLACEMENT (IOC);  Surgeon: Tonny Branch, MD;  Location: [facility];  Service: Ophthalmology;  Laterality: Left;  CDE:14.11  . CHOLECYSTECTOMY  05/08/2012  . CHOLECYSTECTOMY N/A 05/08/2012   Procedure: LAPAROSCOPIC CHOLECYSTECTOMY;  Surgeon: Harl Bowie, MD;  Location: [place];  Service: General;  Laterality: N/A;  . COLONOSCOPY    . LIVER BIOPSY N/A 05/08/2012   Procedure: LIVER BIOPSY;  Surgeon: Harl Bowie, MD;  Location: [place];  Service: General;  Laterality: N/A;  . NECK SURGERY     cervical disc with cliip     Current Outpatient Prescriptions  Medication Sig Dispense Refill  . albuterol (PROVENTIL HFA;VENTOLIN HFA) 108 (90 Base) MCG/ACT inhaler Inhale 2 puffs into the lungs every 6 (six) hours as needed for wheezing. 1 Inhaler 2  . apixaban (ELIQUIS) 5 MG TABS tablet Take 1 tablet (5 mg total) by mouth 2 (two) times daily. 60 tablet 3  . bisacodyl (DUCODYL) 5 MG EC tablet Take 5 mg by mouth daily as needed for moderate constipation.    Marland Kitchen diltiazem (CARDIZEM CD) 240 MG 24 hr capsule Take 1 capsule (240 mg total)  by mouth daily. 90 capsule 3  . hydrochlorothiazide (HYDRODIURIL) 25 MG tablet TAKE 1 TABLET(25 MG) BY MOUTH DAILY 90 tablet 0  . HYDROcodone-acetaminophen (NORCO) 10-325 MG tablet Take 1 tablet by mouth 3 (three) times daily as needed. 90 tablet 0  . latanoprost (XALATAN) 0.005 % ophthalmic solution Place 1 drop into both eyes at bedtime. Reported on 07/21/2015    . omeprazole (PRILOSEC) 20 MG capsule Take 20 mg by mouth daily as needed. Reported on 07/21/2015    . predniSONE (DELTASONE) 10 MG tablet Take 10 mg by mouth daily with  breakfast.     No current facility-administered medications for this visit.     Allergies:   Oxycodone; Pravastatin; Simvastatin; Prednisone; and Tegretol [carbamazepine]    Social History:  The patient  reports that he quit smoking about 12 years ago. His smoking use included Cigarettes. He has a 26.00 pack-year smoking history. He has never used smokeless tobacco. He reports that he does not drink alcohol or use drugs.   Family History:  The patient's family history includes COPD in his mother; Cancer in his sister; Congestive Heart Failure in his mother; Hypertension in his mother; Stroke in his father.    ROS: All other systems are reviewed and negative. Unless otherwise mentioned in H&P    PHYSICAL EXAM: VS:  BP (!) 146/86   Pulse 80   Ht 6' 1.25" (1.861 m)   Wt 259 lb (117.5 kg)   SpO2 92%   BMI 33.94 kg/m  , BMI Body mass index is 33.94 kg/m. GEN: Well nourished, well developed, in no acute distress  HEENT: normal  Neck: no JVD, carotid bruits, or masses Cardiac: IRRR; no murmurs, rubs, or gallops,no edema  Respiratory:  clear to auscultation bilaterally, normal work of breathing GI: soft, nontender, nondistended, + BS MS: no deformity or atrophy  Skin: warm and dry, no rash Neuro:  Strength and sensation are intact Psych: euthymic mood, full affect   EKG:  EKG ordered today. The ekg ordered today demonstrates atrial fibrillation heart rate of 97 bpm. T-wave inversion inferior lead III.   Recent Labs: 07/21/2015: ALT 25; Hemoglobin 16.1 11/14/2015: BUN 12; Creatinine, Ser 0.83; Potassium 4.2; Sodium 141 12/12/2015: BNP 115.1; Platelets 128    Lipid Panel    Component Value Date/Time   CHOL 145 06/13/2015 0728   TRIG 101 06/13/2015 0728   HDL 33 (L) 06/13/2015 0728   CHOLHDL 4.4 06/13/2015 0728   VLDL 20 06/13/2015 0728   LDLCALC 92 06/13/2015 0728      Wt Readings from Last 3 Encounters:  12/29/15 259 lb (117.5 kg)  12/13/15 254 lb (115.2 kg)   12/07/15 258 lb 12.8 oz (117.4 kg)      Other studies Reviewed: Echocardiogram 12-07-15 Left ventricle: The cavity size was normal. Wall thickness was   normal. The estimated ejection fraction was 55%. Although no   diagnostic regional wall motion abnormality was identified, this   possibility cannot be completely excluded on the basis of this   study. - Aortic valve: Moderately calcified annulus. Trileaflet; mildly   calcified leaflets. Cusp separation was reduced. Aortic valve was   not completely interrogated. There looks to be mild aortic   stenosis. Valve area by planimetry 1.8 cm^2. - Mitral valve: Calcified annulus. Mildly thickened, mildly   calcified leaflets . There was mild regurgitation. - Left atrium: The atrium was moderately dilated. - Right atrium: The atrium was mildly dilated. Central venous   pressure (est): 3 mm  Hg. - Atrial septum: No defect or patent foramen ovale was identified. - Tricuspid valve: There was trivial regurgitation. - Pulmonary arteries: PA peak pressure: 30 mm Hg (S). - Pericardium, extracardiac: There was no pericardial effusion.  ASSESSMENT AND PLAN:  1.  Persistent atrial fibrillation: The patient has undergone direct current cardioversion by Dr. Johnsie Cancel which did convert him to normal sinus rhythm. However today he is back in atrial fibrillation. I've explained to him that he will need to continue ELIQUIS along with diltiazem for rate control. He is to avoid caffeine. He is to follow-up with Dr. Johnsie Cancel on previously scheduled appointment in February. Can discuss repeat cardioversion versus referral to EP. At this time no changes in medication regimen.  2. Hypertension: Blood pressure is slightly elevated today. May need to consider increasing diltiazem if follow-up blood pressure remains elevated.  3. History of COPD: Follow PCP for ongoing recommendations and treatment. Referral to pulmonology if clinically indicated  Current medicines  are reviewed at length with the patient today.    Labs/ tests ordered today include:   Orders Placed This Encounter  Procedures  . EKG 12-Lead     Disposition:   FU with  Dr. Johnsie Cancel, Previously scheduled appointment in 6 weeks Signed, Jory Sims, NP  12/29/2015 4:19 PM    [facility]. [address] [place], [place] [address] Phone: [phone]; Fax: [phone]

## 2015-12-29 NOTE — Patient Instructions (Addendum)
  Medication Instructions:  Your physician recommends that you continue on your current medications as directed. Please refer to the Current Medication list given to you today.   Labwork: NONE  Testing/Procedures: NONE  Follow-Up: Your physician recommends that you schedule a follow-up appointment in: [place] February 12, 2015 with Dr. Johnsie Cancel    Any Other Special Instructions Will Be Listed Below (If Applicable).     If you need a refill on your cardiac medications before your next appointment, please call your pharmacy.

## 2016-01-02 ENCOUNTER — Ambulatory Visit (INDEPENDENT_AMBULATORY_CARE_PROVIDER_SITE_OTHER): Payer: Medicare HMO | Admitting: Family Medicine

## 2016-01-02 VITALS — BP 128/89 | Temp 98.6°F | Ht 73.25 in | Wt 256.4 lb

## 2016-01-02 DIAGNOSIS — J8410 Pulmonary fibrosis, unspecified: Secondary | ICD-10-CM | POA: Diagnosis not present

## 2016-01-02 DIAGNOSIS — B9689 Other specified bacterial agents as the cause of diseases classified elsewhere: Secondary | ICD-10-CM | POA: Diagnosis not present

## 2016-01-02 DIAGNOSIS — G510 Bell's palsy: Secondary | ICD-10-CM | POA: Diagnosis not present

## 2016-01-02 DIAGNOSIS — M542 Cervicalgia: Secondary | ICD-10-CM

## 2016-01-02 DIAGNOSIS — J019 Acute sinusitis, unspecified: Secondary | ICD-10-CM | POA: Diagnosis not present

## 2016-01-02 DIAGNOSIS — G8929 Other chronic pain: Secondary | ICD-10-CM | POA: Diagnosis not present

## 2016-01-02 DIAGNOSIS — M545 Low back pain: Secondary | ICD-10-CM | POA: Diagnosis not present

## 2016-01-02 MED ORDER — PREDNISONE 20 MG PO TABS: ORAL_TABLET | ORAL | 0 refills | Status: DC

## 2016-01-02 MED ORDER — HYDROCODONE-ACETAMINOPHEN 10-325 MG PO TABS: 1.0000 | ORAL_TABLET | Freq: Three times a day (TID) | ORAL | 0 refills | Status: DC | PRN

## 2016-01-02 MED ORDER — FLUTICASONE PROPIONATE 50 MCG/ACT NA SUSP: 2.0000 | Freq: Every day | NASAL | 5 refills | Status: AC

## 2016-01-02 MED ORDER — CEFPROZIL 500 MG PO TABS: 500.0000 mg | ORAL_TABLET | Freq: Two times a day (BID) | ORAL | 0 refills | Status: DC

## 2016-01-02 NOTE — Progress Notes (Signed)
   Subjective:    Patient ID: Curtis [name], male    DOB: December 13, 1945, 70 y.o.   MRN: [id number]  Cough  This is a new problem. The current episode started in the past 7 days. Associated symptoms include rhinorrhea and wheezing. Pertinent negatives include no chest pain, ear pain or fever. Associated symptoms comments: Wheezing worse at night.   This patient has a history of interstitial lung disease. He gets frequent flareups of respiratory illnesses and wheezing. This patient has chronic neck pain chronic back pain uses pain medicine no more than 3 times per day denies abusing it denies being addicted to it Patient also with chronic lung disease follows with the specialists as told him the day but gets flareups occasionally including this one Patient also has Bell's palsy which is gradually improving but does frustrate him because it makes it more difficult to eat and drink  Review of Systems  Constitutional: Negative for activity change and fever.  HENT: Positive for congestion and rhinorrhea. Negative for ear pain.   Eyes: Negative for discharge.  Respiratory: Positive for cough and wheezing.   Cardiovascular: Negative for chest pain.       Objective:   Physical Exam  Constitutional: He appears well-developed.  HENT:  Head: Normocephalic.  Mouth/Throat: Oropharynx is clear and moist. No oropharyngeal exudate.  Neck: Normal range of motion.  Cardiovascular: Normal rate, regular rhythm and normal heart sounds.   No murmur heard. Pulmonary/Chest: Effort normal. He has wheezes.  Lymphadenopathy:    He has no cervical adenopathy.  Neurological: He exhibits normal muscle tone.  Skin: Skin is warm and dry.  Nursing note and vitals reviewed.  Patient spelled palsy is improving Patient does have chronic back and neck pain he requested refills on his pain medicine. Patient is responsible with his medicine these refills were given follow-up within 3 months  Patient with severe  interstitial lung disease followed by specialist at [facility]. He would like to reestablish in [place]. He is taking his chronic medications. Stable until this flareup. Multiple issues were covered at this office visit including chronic pain review of his Bell's palsy referral to pulmonology for interstitial lung disease and treatment of the current infection 25 minutes spent with patient greater than half in discussion of all these issues    Assessment & Plan:  Viral syndrome Secondary rhinosinusitis Secondary bronchitis Antibiotics prescribed Prednisone taper Warning signs discussed follow-up if problems  Patient is requesting referral to pulmonology in [place] because it is too difficult for him to get back and forth to [facility] for his ongoing care. He would like to have more local.

## 2016-01-03 ENCOUNTER — Encounter: Payer: Self-pay | Admitting: Family Medicine

## 2016-01-09 ENCOUNTER — Telehealth: Payer: Self-pay | Admitting: Family Medicine

## 2016-01-09 MED ORDER — PREDNISONE 20 MG PO TABS: ORAL_TABLET | ORAL | 0 refills | Status: DC

## 2016-01-09 MED ORDER — LEVOFLOXACIN 500 MG PO TABS: 500.0000 mg | ORAL_TABLET | Freq: Every day | ORAL | 0 refills | Status: DC

## 2016-01-09 NOTE — Telephone Encounter (Signed)
Notified patient Levaquin 500 mg 1 daily for 10 days, prednisone 20 mg 2 a day for the next 4 days then 1 a day for the next 4 days then he may resume his previous prednisone dosing-please follow-up if ongoing troubles. Patient verbalized understanding. Med sent to pharmacy.

## 2016-01-09 NOTE — Telephone Encounter (Signed)
Levaquin 500 mg 1 daily for 10 days, prednisone 20 mg 2 a day for the next 4 days then 1 a day for the next 4 days then he may resume his previous prednisone dosing-please follow-up if ongoing troubles

## 2016-01-09 NOTE — Telephone Encounter (Signed)
Patient is requesting stronger antibiotic was seen 12/26 and given cefprozil 500,fluticasone.prednisone 20 mg.He states medication not helping wheezing,coughing real bad at night where he cant sleep.[facility]

## 2016-01-09 NOTE — Telephone Encounter (Signed)
Patient states that he is having wheezing, coughing bad at night. No fever or other symptoms.

## 2016-01-12 ENCOUNTER — Encounter (HOSPITAL_COMMUNITY): Payer: Self-pay | Admitting: Nurse Practitioner

## 2016-01-12 ENCOUNTER — Ambulatory Visit (HOSPITAL_COMMUNITY)
Admission: RE | Admit: 2016-01-12 | Discharge: 2016-01-12 | Disposition: A | Discharge status: Home / Self Care | Payer: PPO | Source: Ambulatory Visit | Attending: Nurse Practitioner | Admitting: Nurse Practitioner

## 2016-01-12 ENCOUNTER — Inpatient Hospital Stay (HOSPITAL_COMMUNITY): Admission: RE | Admit: 2016-01-12 | Payer: Medicare HMO | Source: Ambulatory Visit | Admitting: Nurse Practitioner

## 2016-01-12 VITALS — BP 120/84 | HR 103 | Ht 73.25 in | Wt 260.2 lb

## 2016-01-12 DIAGNOSIS — J45909 Unspecified asthma, uncomplicated: Secondary | ICD-10-CM | POA: Diagnosis not present

## 2016-01-12 DIAGNOSIS — I481 Persistent atrial fibrillation: Secondary | ICD-10-CM | POA: Insufficient documentation

## 2016-01-12 DIAGNOSIS — I10 Essential (primary) hypertension: Secondary | ICD-10-CM | POA: Diagnosis not present

## 2016-01-12 DIAGNOSIS — Z9889 Other specified postprocedural states: Secondary | ICD-10-CM | POA: Insufficient documentation

## 2016-01-12 DIAGNOSIS — Z8249 Family history of ischemic heart disease and other diseases of the circulatory system: Secondary | ICD-10-CM | POA: Diagnosis not present

## 2016-01-12 DIAGNOSIS — Z803 Family history of malignant neoplasm of breast: Secondary | ICD-10-CM | POA: Diagnosis not present

## 2016-01-12 DIAGNOSIS — Z9049 Acquired absence of other specified parts of digestive tract: Secondary | ICD-10-CM | POA: Diagnosis not present

## 2016-01-12 DIAGNOSIS — H409 Unspecified glaucoma: Secondary | ICD-10-CM | POA: Diagnosis not present

## 2016-01-12 DIAGNOSIS — E785 Hyperlipidemia, unspecified: Secondary | ICD-10-CM | POA: Insufficient documentation

## 2016-01-12 DIAGNOSIS — J449 Chronic obstructive pulmonary disease, unspecified: Secondary | ICD-10-CM | POA: Insufficient documentation

## 2016-01-12 DIAGNOSIS — J84112 Idiopathic pulmonary fibrosis: Secondary | ICD-10-CM | POA: Insufficient documentation

## 2016-01-12 DIAGNOSIS — Z823 Family history of stroke: Secondary | ICD-10-CM | POA: Diagnosis not present

## 2016-01-12 DIAGNOSIS — K219 Gastro-esophageal reflux disease without esophagitis: Secondary | ICD-10-CM | POA: Diagnosis not present

## 2016-01-12 DIAGNOSIS — Z79899 Other long term (current) drug therapy: Secondary | ICD-10-CM | POA: Insufficient documentation

## 2016-01-12 DIAGNOSIS — Z87891 Personal history of nicotine dependence: Secondary | ICD-10-CM | POA: Insufficient documentation

## 2016-01-12 DIAGNOSIS — I4819 Other persistent atrial fibrillation: Secondary | ICD-10-CM

## 2016-01-12 DIAGNOSIS — Z888 Allergy status to other drugs, medicaments and biological substances status: Secondary | ICD-10-CM | POA: Insufficient documentation

## 2016-01-12 NOTE — Progress Notes (Signed)
Primary Care Physician: Sallee Lange, MD Referring Physician: Dr. Jerolyn Center [place] is a 71 y.o. male with a h/o of persistent afib in the atrial fibrillation clinic for evaluation.  H/o new onset since July of  by ECG at Dr Lance Sell office. History of COPD and chronic interstitial lung dx with dyspnea and fatigue Previously seen by cardiology 2013 and had normal myoview study with EF 61%  Done for atypical chest pain. History of HTN and elevated lipids but Intolerant to statins. Is followed at [facility] for UIP and instertial lung disease .  He has a successful  cardioversion 12/5 which held pt in rhythm x 2 weeks. He saw a significant improvement in SR. He is on apixaban 5 mg bid and takes without fail.  Today, he denies symptoms of palpitations,  orthopnea, PND, lower extremity edema, dizziness, presyncope, syncope, or neurologic sequela.  Positive for shortness of breath and fatigue, palpitations.The patient is tolerating medications without difficulties and is otherwise without complaint today.   Past Medical History:  Diagnosis Date  . Arthritis   . Asthma   . Cervical spondylosis   . Chest pain    Emergency room October 24, 2011, no MI  //   Nuclear, October, 2013, adenosine, EF 61%, no scar or ischemia  . Dyslipidemia    Statin intolerant  . GERD (gastroesophageal reflux disease)   . Glaucoma   . Hearing loss   . Hypertension   . Idiopathic pulmonary fibrosis ([place])   . Interstitial pulmonary fibrosis ([place]) 2012  . Left shoulder pain 2011  . Psoriasis   . Statin intolerance    Past Surgical History:  Procedure Laterality Date  . BRAIN SURGERY    . CARDIOVERSION N/A 12/13/2015   Procedure: CARDIOVERSION;  Surgeon: Josue Hector, MD;  Location: [place];  Service: Cardiovascular;  Laterality: N/A;  . CATARACT EXTRACTION W/PHACO Right 06/11/2012   Procedure: CATARACT EXTRACTION PHACO AND INTRAOCULAR LENS PLACEMENT ([place]);  Surgeon: Tonny Branch, MD;  Location: [facility];   Service: Ophthalmology;  Laterality: Right;  CDE:  14.43  . CATARACT EXTRACTION W/PHACO Left 06/29/2012   Procedure: CATARACT EXTRACTION PHACO AND INTRAOCULAR LENS PLACEMENT ([facility]);  Surgeon: Tonny Branch, MD;  Location: [facility];  Service: Ophthalmology;  Laterality: Left;  CDE:14.11  . CHOLECYSTECTOMY  05/08/2012  . CHOLECYSTECTOMY N/A 05/08/2012   Procedure: LAPAROSCOPIC CHOLECYSTECTOMY;  Surgeon: Harl Bowie, MD;  Location: [place];  Service: General;  Laterality: N/A;  . COLONOSCOPY    . LIVER BIOPSY N/A 05/08/2012   Procedure: LIVER BIOPSY;  Surgeon: Harl Bowie, MD;  Location: [place];  Service: General;  Laterality: N/A;  . NECK SURGERY     cervical disc with cliip    Current Outpatient Prescriptions  Medication Sig Dispense Refill  . albuterol (PROVENTIL HFA;VENTOLIN HFA) 108 (90 Base) MCG/ACT inhaler Inhale 2 puffs into the lungs every 6 (six) hours as needed for wheezing. 1 Inhaler 2  . apixaban (ELIQUIS) 5 MG TABS tablet Take 1 tablet (5 mg total) by mouth 2 (two) times daily. 60 tablet 3  . bisacodyl (DUCODYL) 5 MG EC tablet Take 5 mg by mouth daily as needed for moderate constipation.    . cefPROZIL (CEFZIL) 500 MG tablet Take 1 tablet (500 mg total) by mouth 2 (two) times daily. 20 tablet 0  . diltiazem (CARDIZEM CD) 240 MG 24 hr capsule Take 1 capsule (240 mg total) by mouth daily. 90 capsule 3  . fluticasone (FLONASE) 50 MCG/ACT  nasal spray Place 2 sprays into both nostrils daily. 16 g 5  . hydrochlorothiazide (HYDRODIURIL) 25 MG tablet TAKE 1 TABLET(25 MG) BY MOUTH DAILY 90 tablet 0  . HYDROcodone-acetaminophen (NORCO) 10-325 MG tablet Take 1 tablet by mouth 3 (three) times daily as needed. 90 tablet 0  . latanoprost (XALATAN) 0.005 % ophthalmic solution Place 1 drop into both eyes at bedtime. Reported on 07/21/2015    . levofloxacin (LEVAQUIN) 500 MG tablet Take 1 tablet (500 mg total) by mouth daily. X 10 days 10 tablet 0  . omeprazole (PRILOSEC) 20 MG capsule Take 20 mg  by mouth daily as needed. Reported on 07/21/2015    . predniSONE (DELTASONE) 10 MG tablet Take 10 mg by mouth daily with breakfast.     No current facility-administered medications for this encounter.     Allergies  Allergen Reactions  . Oxycodone Itching  . Pravastatin Other (See Comments)    Chills    . Simvastatin Other (See Comments)    Chills    . Prednisone Anxiety    Irritation  . Tegretol [Carbamazepine] Other (See Comments)    dizziness    Social History   Social History  . Marital status: Married    Spouse name: N/A  . Number of children: N/A  . Years of education: N/A   Occupational History  . Not on file.   Social History Main Topics  . Smoking status: Former Smoker    Packs/day: 0.50    Years: 52.00    Types: Cigarettes    Quit date: 09/08/2003  . Smokeless tobacco: Never Used  . Alcohol use No     Comment: quit drinking 2004  . Drug use: No  . Sexual activity: Yes    Birth control/ protection: None   Other Topics Concern  . Not on file   Social History Narrative  . No narrative on file    Family History  Problem Relation Age of Onset  . Hypertension Mother   . Congestive Heart Failure Mother   . COPD Mother   . Stroke Father   . Cancer Sister     breast    ROS- All systems are reviewed and negative except as per the HPI above  Physical Exam: Vitals:   01/12/16 1143  BP: 120/84  Pulse: (!) 103  Weight: 260 lb 3.2 oz (118 kg)  Height: 6' 1.25" (1.861 m)   Wt Readings from Last 3 Encounters:  01/12/16 260 lb 3.2 oz (118 kg)  01/02/16 256 lb 6.4 oz (116.3 kg)  12/29/15 259 lb (117.5 kg)    Labs: Lab Results  Component Value Date   NA 141 11/14/2015   K 4.2 11/14/2015   CL 97 11/14/2015   CO2 25 11/14/2015   GLUCOSE 96 11/14/2015   BUN 12 11/14/2015   CREATININE 0.83 11/14/2015   CALCIUM 9.6 11/14/2015   Lab Results  Component Value Date   INR 1.1 12/12/2015   Lab Results  Component Value Date   CHOL 145 06/13/2015     HDL 33 (L) 06/13/2015   LDLCALC 92 06/13/2015   TRIG 101 06/13/2015     GEN- The patient is well appearing, alert and oriented x 3 today.   Head- normocephalic, atraumatic Eyes-  Sclera clear, conjunctiva pink Ears- hearing intact Oropharynx- clear Neck- supple, no JVP Lymph- no cervical lymphadenopathy Lungs- Clear to ausculation bilaterally, normal work of breathing Heart- irregular rate and rhythm, no murmurs, rubs or gallops, PMI not laterally displaced  GI- soft, NT, ND, + BS Extremities- no clubbing, cyanosis, or edema MS- no significant deformity or atrophy Skin- no rash or lesion Psych- euthymic mood, full affect Neuro- strength and sensation are intact  EKG-afib with rvr at 102 bpm qrs itn 90 ms, qtc 466 ms Epic records reviewed    Assessment and Plan: 1. Persistent symptomatic afib Pt definitely feels improved in SR Doubt cardioversion alone would be helpful to keep in rhythm long term  AAD discussed  Amiodarone is not an option due to baseline interstitial disease Discussed multq, flecainide, no h/o HF or CAD but it has been some years since pt has had a stress test Sotalol is not a good option since pt does have asthmas  and wheezes at times Tikosyn also an option if cost is not prohibitive, qtc in SR is around 440 ms, renal function looks ok for  use He will continue on apixaban, states no missed doses  F/u after pt checks on prices of above drugs, one week for definite therapy  Butch Penny C. Yahya Boldman, [facility] [address] [place], [place] [address] [phone]

## 2016-01-19 ENCOUNTER — Ambulatory Visit (HOSPITAL_COMMUNITY): Payer: Medicare HMO | Admitting: Nurse Practitioner

## 2016-01-19 ENCOUNTER — Ambulatory Visit (HOSPITAL_COMMUNITY)
Admission: RE | Admit: 2016-01-19 | Discharge: 2016-01-19 | Disposition: A | Discharge status: Home / Self Care | Payer: PPO | Source: Ambulatory Visit | Attending: Nurse Practitioner | Admitting: Nurse Practitioner

## 2016-01-19 VITALS — BP 118/70 | HR 103 | Ht 73.25 in | Wt 260.0 lb

## 2016-01-19 DIAGNOSIS — H409 Unspecified glaucoma: Secondary | ICD-10-CM | POA: Insufficient documentation

## 2016-01-19 DIAGNOSIS — R5383 Other fatigue: Secondary | ICD-10-CM | POA: Insufficient documentation

## 2016-01-19 DIAGNOSIS — Z803 Family history of malignant neoplasm of breast: Secondary | ICD-10-CM | POA: Insufficient documentation

## 2016-01-19 DIAGNOSIS — K219 Gastro-esophageal reflux disease without esophagitis: Secondary | ICD-10-CM | POA: Insufficient documentation

## 2016-01-19 DIAGNOSIS — J449 Chronic obstructive pulmonary disease, unspecified: Secondary | ICD-10-CM | POA: Insufficient documentation

## 2016-01-19 DIAGNOSIS — J45909 Unspecified asthma, uncomplicated: Secondary | ICD-10-CM | POA: Insufficient documentation

## 2016-01-19 DIAGNOSIS — Z823 Family history of stroke: Secondary | ICD-10-CM | POA: Insufficient documentation

## 2016-01-19 DIAGNOSIS — Z9049 Acquired absence of other specified parts of digestive tract: Secondary | ICD-10-CM | POA: Insufficient documentation

## 2016-01-19 DIAGNOSIS — E785 Hyperlipidemia, unspecified: Secondary | ICD-10-CM | POA: Diagnosis not present

## 2016-01-19 DIAGNOSIS — R0600 Dyspnea, unspecified: Secondary | ICD-10-CM | POA: Diagnosis not present

## 2016-01-19 DIAGNOSIS — Z79899 Other long term (current) drug therapy: Secondary | ICD-10-CM | POA: Insufficient documentation

## 2016-01-19 DIAGNOSIS — I4819 Other persistent atrial fibrillation: Secondary | ICD-10-CM

## 2016-01-19 DIAGNOSIS — Z87891 Personal history of nicotine dependence: Secondary | ICD-10-CM | POA: Insufficient documentation

## 2016-01-19 DIAGNOSIS — I10 Essential (primary) hypertension: Secondary | ICD-10-CM | POA: Diagnosis not present

## 2016-01-19 DIAGNOSIS — Z888 Allergy status to other drugs, medicaments and biological substances status: Secondary | ICD-10-CM | POA: Insufficient documentation

## 2016-01-19 DIAGNOSIS — J84112 Idiopathic pulmonary fibrosis: Secondary | ICD-10-CM | POA: Insufficient documentation

## 2016-01-19 DIAGNOSIS — I481 Persistent atrial fibrillation: Secondary | ICD-10-CM | POA: Diagnosis not present

## 2016-01-19 DIAGNOSIS — Z9889 Other specified postprocedural states: Secondary | ICD-10-CM | POA: Diagnosis not present

## 2016-01-19 NOTE — Progress Notes (Addendum)
Primary Care Physician: Sallee Lange, MD Referring Physician: Dr. Jerolyn Center Mccamy is a 71 y.o. male with a h/o of persistent afib in the atrial fibrillation clinic for evaluation.  H/o new onset since July of  by ECG at Dr Lance Sell office. History of COPD and chronic interstitial lung dx with dyspnea and fatigue Previously seen by cardiology 2013 and had normal myoview study with EF 61%.  Done for atypical chest pain. History of HTN and elevated lipids but Intolerant to statins. Is followed at [facility] for UIP and instertial lung disease .  He has a successful  cardioversion 12/5 which held pt in rhythm x 2 weeks. He saw a significant improvement in SR. He is on apixaban 5 mg bid and takes without fail.On initial visit, various antiarrythmic's were discussed with wife and pt and they wanted to look into costs. He was also on prednisone and levaquin at the time for UTI and wanted to finish. He checked prices on drugs and reviewed options again. He does want to try flecainide but will need a stress test since last test was 5 years ok and if ok will start drug right away.  Today, he denies symptoms of palpitations,  orthopnea, PND, lower extremity edema, dizziness, presyncope, syncope, or neurologic sequela.  Positive for shortness of breath and fatigue, palpitations.The patient is tolerating medications without difficulties and is otherwise without complaint today.   Past Medical History:  Diagnosis Date  . Arthritis   . Asthma   . Cervical spondylosis   . Chest pain    Emergency room October 24, 2011, no MI  //   Nuclear, October, 2013, adenosine, EF 61%, no scar or ischemia  . Dyslipidemia    Statin intolerant  . GERD (gastroesophageal reflux disease)   . Glaucoma   . Hearing loss   . Hypertension   . Idiopathic pulmonary fibrosis ([place])   . Interstitial pulmonary fibrosis ([place]) 2012  . Left shoulder pain 2011  . Psoriasis   . Statin intolerance    Past Surgical History:    Procedure Laterality Date  . BRAIN SURGERY    . CARDIOVERSION N/A 12/13/2015   Procedure: CARDIOVERSION;  Surgeon: Josue Hector, MD;  Location: [place];  Service: Cardiovascular;  Laterality: N/A;  . CATARACT EXTRACTION W/PHACO Right 06/11/2012   Procedure: CATARACT EXTRACTION PHACO AND INTRAOCULAR LENS PLACEMENT ([place]);  Surgeon: Tonny Branch, MD;  Location: [facility];  Service: Ophthalmology;  Laterality: Right;  CDE:  14.43  . CATARACT EXTRACTION W/PHACO Left 06/29/2012   Procedure: CATARACT EXTRACTION PHACO AND INTRAOCULAR LENS PLACEMENT ([facility]);  Surgeon: Tonny Branch, MD;  Location: [facility];  Service: Ophthalmology;  Laterality: Left;  CDE:14.11  . CHOLECYSTECTOMY  05/08/2012  . CHOLECYSTECTOMY N/A 05/08/2012   Procedure: LAPAROSCOPIC CHOLECYSTECTOMY;  Surgeon: Harl Bowie, MD;  Location: [place];  Service: General;  Laterality: N/A;  . COLONOSCOPY    . LIVER BIOPSY N/A 05/08/2012   Procedure: LIVER BIOPSY;  Surgeon: Harl Bowie, MD;  Location: [place];  Service: General;  Laterality: N/A;  . NECK SURGERY     cervical disc with cliip    Current Outpatient Prescriptions  Medication Sig Dispense Refill  . albuterol (PROVENTIL HFA;VENTOLIN HFA) 108 (90 Base) MCG/ACT inhaler Inhale 2 puffs into the lungs every 6 (six) hours as needed for wheezing. 1 Inhaler 2  . apixaban (ELIQUIS) 5 MG TABS tablet Take 1 tablet (5 mg total) by mouth 2 (two) times daily. 60 tablet 3  .  bisacodyl (DUCODYL) 5 MG EC tablet Take 5 mg by mouth daily as needed for moderate constipation.    . cefPROZIL (CEFZIL) 500 MG tablet Take 1 tablet (500 mg total) by mouth 2 (two) times daily. 20 tablet 0  . diltiazem (CARDIZEM CD) 240 MG 24 hr capsule Take 1 capsule (240 mg total) by mouth daily. 90 capsule 3  . fluticasone (FLONASE) 50 MCG/ACT nasal spray Place 2 sprays into both nostrils daily. 16 g 5  . hydrochlorothiazide (HYDRODIURIL) 25 MG tablet TAKE 1 TABLET(25 MG) BY MOUTH DAILY 90 tablet 0  .  HYDROcodone-acetaminophen (NORCO) 10-325 MG tablet Take 1 tablet by mouth 3 (three) times daily as needed. 90 tablet 0  . latanoprost (XALATAN) 0.005 % ophthalmic solution Place 1 drop into both eyes at bedtime. Reported on 07/21/2015    . omeprazole (PRILOSEC) 20 MG capsule Take 20 mg by mouth daily as needed. Reported on 07/21/2015     No current facility-administered medications for this encounter.     Allergies  Allergen Reactions  . Oxycodone Itching  . Pravastatin Other (See Comments)    Chills    . Simvastatin Other (See Comments)    Chills    . Prednisone Anxiety    Irritation  . Tegretol [Carbamazepine] Other (See Comments)    dizziness    Social History   Social History  . Marital status: Married    Spouse name: N/A  . Number of children: N/A  . Years of education: N/A   Occupational History  . Not on file.   Social History Main Topics  . Smoking status: Former Smoker    Packs/day: 0.50    Years: 52.00    Types: Cigarettes    Quit date: 09/08/2003  . Smokeless tobacco: Never Used  . Alcohol use No     Comment: quit drinking 2004  . Drug use: No  . Sexual activity: Yes    Birth control/ protection: None   Other Topics Concern  . Not on file   Social History Narrative  . No narrative on file    Family History  Problem Relation Age of Onset  . Hypertension Mother   . Congestive Heart Failure Mother   . COPD Mother   . Stroke Father   . Cancer Sister     breast    ROS- All systems are reviewed and negative except as per the HPI above  Physical Exam: Vitals:   01/19/16 0954  BP: 118/70  Pulse: (!) 103  Weight: 260 lb (117.9 kg)  Height: 6' 1.25" (1.861 m)   Wt Readings from Last 3 Encounters:  01/19/16 260 lb (117.9 kg)  01/12/16 260 lb 3.2 oz (118 kg)  01/02/16 256 lb 6.4 oz (116.3 kg)    Labs: Lab Results  Component Value Date   NA 141 11/14/2015   K 4.2 11/14/2015   CL 97 11/14/2015   CO2 25 11/14/2015   GLUCOSE 96 11/14/2015     BUN 12 11/14/2015   CREATININE 0.83 11/14/2015   CALCIUM 9.6 11/14/2015   Lab Results  Component Value Date   INR 1.1 12/12/2015   Lab Results  Component Value Date   CHOL 145 06/13/2015   HDL 33 (L) 06/13/2015   LDLCALC 92 06/13/2015   TRIG 101 06/13/2015     GEN- The patient is well appearing, alert and oriented x 3 today.   Head- normocephalic, atraumatic Eyes-  Sclera clear, conjunctiva pink Ears- hearing intact Oropharynx- clear Neck- supple, no  JVP Lymph- no cervical lymphadenopathy Lungs- Clear to ausculation bilaterally, normal work of breathing Heart- irregular rate and rhythm, no murmurs, rubs or gallops, PMI not laterally displaced GI- soft, NT, ND, + BS Extremities- no clubbing, cyanosis, or edema MS- no significant deformity or atrophy Skin- no rash or lesion Psych- euthymic mood, full affect Neuro- strength and sensation are intact  EKG-afib with rvr at 102 bpm qrs itn 90 ms, qtc 466 ms Epic records reviewed    Assessment and Plan: 1. Persistent symptomatic afib Pt definitely feels improved in SR Doubt cardioversion alone would be helpful to keep in rhythm long term  AAD discussed  Amiodarone is not an option due to baseline interstitial disease Discussed multq, flecainide, no h/o HF or CAD but it has been some years since pt has had a stress test Sotalol is not a good option since pt does have asthma and wheezes at times Tikosyn would be affordable for pt but he would like to avoid hospitalization He will continue on apixaban, states no missed doses  Have scheduled pt for lexi myoview and if normal will start flecainide 50 mg bid, followed by EKG in 5 days and increase to 100 mg bid followed by cardioversion if drug does not convert   2/2- Pt's myoview returned with no ischemia but a perfusion defect. I discussed with Dr. Rayann Heman who feels in that situation it may be best to avoid flecainide. Sotalol is felt possible to aggravate asthma/lung  disease and his wife states per [facility], he would only have to pay $15 a month for tikosyn. The pt would like to proceed with hospitalization for tikosyn loading. He will have to stop hctz.   Geroge Baseman Rhonna Holster, [facility] [address] [place], [place] [address] [phone]

## 2016-01-22 ENCOUNTER — Telehealth (HOSPITAL_COMMUNITY): Payer: Self-pay | Admitting: *Deleted

## 2016-01-22 NOTE — Telephone Encounter (Signed)
Patient given detailed instructions per Myocardial Perfusion Study Information Sheet for the test on 01/24/16 at 0715. Patient notified to arrive 15 minutes early and that it is imperative to arrive on time for appointment to keep from having the test rescheduled.  If you need to cancel or reschedule your appointment, please call the office within 24 hours of your appointment. Failure to do so may result in a cancellation of your appointment, and a $50 no show fee. Patient verbalized understanding.Kallan Merrick, [facility]

## 2016-01-24 ENCOUNTER — Encounter (HOSPITAL_COMMUNITY): Payer: PPO

## 2016-01-24 ENCOUNTER — Encounter (HOSPITAL_COMMUNITY): Payer: Medicare Other

## 2016-01-31 ENCOUNTER — Telehealth (HOSPITAL_COMMUNITY): Payer: Self-pay | Admitting: *Deleted

## 2016-01-31 NOTE — Telephone Encounter (Signed)
Patient given detailed instructions per Myocardial Perfusion Study Information Sheet for the test on 02/02/16. Patient notified to arrive 15 minutes early and that it is imperative to arrive on time for appointment to keep from having the test rescheduled.  If you need to cancel or reschedule your appointment, please call the office within 24 hours of your appointment. Failure to do so may result in a cancellation of your appointment, and a $50 no show fee. Patient verbalized understanding.  Kirstie Peri

## 2016-02-02 ENCOUNTER — Ambulatory Visit (HOSPITAL_COMMUNITY): Payer: PPO | Attending: Cardiovascular Disease

## 2016-02-02 DIAGNOSIS — I481 Persistent atrial fibrillation: Secondary | ICD-10-CM | POA: Diagnosis not present

## 2016-02-02 DIAGNOSIS — R9439 Abnormal result of other cardiovascular function study: Secondary | ICD-10-CM | POA: Diagnosis not present

## 2016-02-02 DIAGNOSIS — I4819 Other persistent atrial fibrillation: Secondary | ICD-10-CM

## 2016-02-02 MED ORDER — REGADENOSON 0.4 MG/5ML IV SOLN
0.4000 mg | Freq: Once | INTRAVENOUS | Status: AC
  Administered 2016-02-02: 0.4 mg via INTRAVENOUS

## 2016-02-02 MED ORDER — TECHNETIUM TC 99M TETROFOSMIN IV KIT
33.0000 | PACK | Freq: Once | INTRAVENOUS | Status: AC | PRN
  Administered 2016-02-02: 33 via INTRAVENOUS
  Filled 2016-02-02: qty 33

## 2016-02-02 MED ORDER — TECHNETIUM TC 99M TETROFOSMIN IV KIT
10.1000 | PACK | Freq: Once | INTRAVENOUS | Status: AC | PRN
  Administered 2016-02-02: 10.1 via INTRAVENOUS
  Filled 2016-02-02: qty 11

## 2016-02-05 DIAGNOSIS — J8410 Pulmonary fibrosis, unspecified: Secondary | ICD-10-CM | POA: Diagnosis not present

## 2016-02-05 LAB — MYOCARDIAL PERFUSION IMAGING
CHL CUP NUCLEAR SDS: 1
CHL CUP RESTING HR STRESS: 94 {beats}/min
CSEPPHR: 111 {beats}/min
RATE: 0.42
SRS: 5
SSS: 6
TID: 1.01

## 2016-02-06 DIAGNOSIS — H02413 Mechanical ptosis of bilateral eyelids: Secondary | ICD-10-CM | POA: Diagnosis not present

## 2016-02-06 DIAGNOSIS — H0259 Other disorders affecting eyelid function: Secondary | ICD-10-CM | POA: Diagnosis not present

## 2016-02-06 DIAGNOSIS — H0270 Unspecified degenerative disorders of eyelid and periocular area: Secondary | ICD-10-CM | POA: Diagnosis not present

## 2016-02-06 DIAGNOSIS — H02132 Senile ectropion of right lower eyelid: Secondary | ICD-10-CM | POA: Diagnosis not present

## 2016-02-06 DIAGNOSIS — H02135 Senile ectropion of left lower eyelid: Secondary | ICD-10-CM | POA: Diagnosis not present

## 2016-02-08 NOTE — Progress Notes (Deleted)
Primary Care Physician: Curtis Lange, MD Referring Physician: Dr. Jerolyn Center [name] is a 71 y.o. male with a h/o of persistent afib  H/o new onset since July of  by ECG at Dr Lance Sell office. History of COPD and chronic interstitial lung dx with dyspnea and fatigue Previously seen by cardiology 2013 and had normal myoview study with EF 61%.  Done for atypical chest pain. History of HTN and elevated lipids but Intolerant to statins. Is followed at [facility] for UIP and instertial lung disease .  He has a successful  cardioversion 12/5 which held pt in rhythm x 2 weeks. He saw a significant improvement in SR. He is on apixaban 5 mg bid and takes without fail. Seen by Curtis [name] 01/19/16 and discussed options Myovue ordered And to start flecainide if non ischemic   Myovue 02/02/16 No ischemia Not gated  Small inferolateral defect at base  Echo:  11/23/15  Study Conclusions  - Left ventricle: The cavity size was normal. Wall thickness was   normal. The estimated ejection fraction was 55%. Although no   diagnostic regional wall motion abnormality was identified, this   possibility cannot be completely excluded on the basis of this   study. - Aortic valve: Moderately calcified annulus. Trileaflet; mildly   calcified leaflets. Cusp separation was reduced. Aortic valve was   not completely interrogated. There looks to be mild aortic   stenosis. Valve area by planimetry 1.8 cm^2. - Mitral valve: Calcified annulus. Mildly thickened, mildly   calcified leaflets . There was mild regurgitation. - Left atrium: The atrium was moderately dilated. - Right atrium: The atrium was mildly dilated. Central venous   pressure (est): 3 mm Hg. - Atrial septum: No defect or patent foramen ovale was identified. - Tricuspid valve: There was trivial regurgitation. - Pulmonary arteries: PA peak pressure: 30 mm Hg (S). - Pericardium, extracardiac: There was no pericardial  effusion.  Impressions:  - Normal LV wall thickness with LVEF approximately 55%.   Indeterminate diastolic function in the setting of atrial   fibrillation. Moderate left atrial enlargement. Calcified mitral   annulus with thickened mitral leaflets and mild mitral   regurgitation. Probable mild calcific aortic stenosis based on   limited interrogation. Trivial tricuspid regurgitation with PASP   30 mmHg. Mild right atrial enlargement. Today, he denies symptoms of palpitations,  orthopnea, PND, lower extremity edema, dizziness, presyncope, syncope, or neurologic sequela.  Positive for shortness of breath and fatigue, palpitations.The patient is tolerating medications without difficulties and is otherwise without complaint today.   Past Medical History:  Diagnosis Date  . Arthritis   . Asthma   . Cervical spondylosis   . Chest pain    Emergency room October 24, 2011, no MI  //   Nuclear, October, 2013, adenosine, EF 61%, no scar or ischemia  . Dyslipidemia    Statin intolerant  . GERD (gastroesophageal reflux disease)   . Glaucoma   . Hearing loss   . Hypertension   . Idiopathic pulmonary fibrosis ([place])   . Interstitial pulmonary fibrosis ([place]) 2012  . Left shoulder pain 2011  . Psoriasis   . Statin intolerance    Past Surgical History:  Procedure Laterality Date  . BRAIN SURGERY    . CARDIOVERSION N/A 12/13/2015   Procedure: CARDIOVERSION;  Surgeon: Josue Hector, MD;  Location: [place];  Service: Cardiovascular;  Laterality: N/A;  . CATARACT EXTRACTION W/PHACO Right 06/11/2012   Procedure: CATARACT EXTRACTION PHACO AND INTRAOCULAR LENS PLACEMENT (  [place]);  Surgeon: Tonny Branch, MD;  Location: [facility];  Service: Ophthalmology;  Laterality: Right;  CDE:  14.43  . CATARACT EXTRACTION W/PHACO Left 06/29/2012   Procedure: CATARACT EXTRACTION PHACO AND INTRAOCULAR LENS PLACEMENT ([facility]);  Surgeon: Tonny Branch, MD;  Location: [facility];  Service: Ophthalmology;  Laterality: Left;  CDE:14.11   . CHOLECYSTECTOMY  05/08/2012  . CHOLECYSTECTOMY N/A 05/08/2012   Procedure: LAPAROSCOPIC CHOLECYSTECTOMY;  Surgeon: Harl Bowie, MD;  Location: [place];  Service: General;  Laterality: N/A;  . COLONOSCOPY    . LIVER BIOPSY N/A 05/08/2012   Procedure: LIVER BIOPSY;  Surgeon: Harl Bowie, MD;  Location: [place];  Service: General;  Laterality: N/A;  . NECK SURGERY     cervical disc with cliip    Current Outpatient Prescriptions  Medication Sig Dispense Refill  . albuterol (PROVENTIL HFA;VENTOLIN HFA) 108 (90 Base) MCG/ACT inhaler Inhale 2 puffs into the lungs every 6 (six) hours as needed for wheezing. 1 Inhaler 2  . apixaban (ELIQUIS) 5 MG TABS tablet Take 1 tablet (5 mg total) by mouth 2 (two) times daily. 60 tablet 3  . bisacodyl (DUCODYL) 5 MG EC tablet Take 5 mg by mouth daily as needed for moderate constipation.    . cefPROZIL (CEFZIL) 500 MG tablet Take 1 tablet (500 mg total) by mouth 2 (two) times daily. 20 tablet 0  . diltiazem (CARDIZEM CD) 240 MG 24 hr capsule Take 1 capsule (240 mg total) by mouth daily. 90 capsule 3  . fluticasone (FLONASE) 50 MCG/ACT nasal spray Place 2 sprays into both nostrils daily. 16 g 5  . hydrochlorothiazide (HYDRODIURIL) 25 MG tablet TAKE 1 TABLET(25 MG) BY MOUTH DAILY 90 tablet 0  . HYDROcodone-acetaminophen (NORCO) 10-325 MG tablet Take 1 tablet by mouth 3 (three) times daily as needed. 90 tablet 0  . latanoprost (XALATAN) 0.005 % ophthalmic solution Place 1 drop into both eyes at bedtime. Reported on 07/21/2015    . omeprazole (PRILOSEC) 20 MG capsule Take 20 mg by mouth daily as needed. Reported on 07/21/2015     No current facility-administered medications for this visit.     Allergies  Allergen Reactions  . Oxycodone Itching  . Pravastatin Other (See Comments)    Chills    . Simvastatin Other (See Comments)    Chills    . Prednisone Anxiety    Irritation  . Tegretol [Carbamazepine] Other (See Comments)    dizziness    Social  History   Social History  . Marital status: Married    Spouse name: N/A  . Number of children: N/A  . Years of education: N/A   Occupational History  . Not on file.   Social History Main Topics  . Smoking status: Former Smoker    Packs/day: 0.50    Years: 52.00    Types: Cigarettes    Quit date: 09/08/2003  . Smokeless tobacco: Never Used  . Alcohol use No     Comment: quit drinking 2004  . Drug use: No  . Sexual activity: Yes    Birth control/ protection: None   Other Topics Concern  . Not on file   Social History Narrative  . No narrative on file    Family History  Problem Relation Age of Onset  . Hypertension Mother   . Congestive Heart Failure Mother   . COPD Mother   . Stroke Father   . Cancer Sister     breast    ROS- All systems are  reviewed and negative except as per the HPI above  Physical Exam: There were no vitals filed for this visit. Wt Readings from Last 3 Encounters:  01/19/16 260 lb (117.9 kg)  01/12/16 260 lb 3.2 oz (118 kg)  01/02/16 256 lb 6.4 oz (116.3 kg)    Labs: Lab Results  Component Value Date   NA 141 11/14/2015   K 4.2 11/14/2015   CL 97 11/14/2015   CO2 25 11/14/2015   GLUCOSE 96 11/14/2015   BUN 12 11/14/2015   CREATININE 0.83 11/14/2015   CALCIUM 9.6 11/14/2015   Lab Results  Component Value Date   INR 1.1 12/12/2015   Lab Results  Component Value Date   CHOL 145 06/13/2015   HDL 33 (L) 06/13/2015   LDLCALC 92 06/13/2015   TRIG 101 06/13/2015     GEN- The patient is well appearing, alert and oriented x 3 today.   Head- normocephalic, atraumatic Eyes-  Sclera clear, conjunctiva pink Ears- hearing intact Oropharynx- clear Neck- supple, no JVP Lymph- no cervical lymphadenopathy Lungs- Clear to ausculation bilaterally, normal work of breathing Heart- irregular rate and rhythm, no murmurs, rubs or gallops, PMI not laterally displaced GI- soft, NT, ND, + BS Extremities- no clubbing, cyanosis, or edema MS- no  significant deformity or atrophy Skin- no rash or lesion Psych- euthymic mood, full affect Neuro- strength and sensation are intact  EKG- 01/19/16  afib with rvr at 102 bpm qrs itn 90 ms, qtc 466 ms   Epic records reviewed    Assessment and Plan:  1. ILD:  F/u [facility] continue inhalers no amiodarone for PAF 2. AFib: 3. GERD:  Continue low carb diet and prilosec 4. AS - mild on echo 11/23/15 but no gradients given

## 2016-02-09 ENCOUNTER — Ambulatory Visit (INDEPENDENT_AMBULATORY_CARE_PROVIDER_SITE_OTHER): Payer: PPO | Admitting: Internal Medicine

## 2016-02-09 ENCOUNTER — Encounter: Payer: Self-pay | Admitting: Internal Medicine

## 2016-02-09 ENCOUNTER — Telehealth: Payer: Self-pay | Admitting: Pharmacist

## 2016-02-09 ENCOUNTER — Telehealth: Payer: Self-pay | Admitting: Internal Medicine

## 2016-02-09 VITALS — BP 126/74 | HR 71 | Ht 73.0 in | Wt 260.4 lb

## 2016-02-09 DIAGNOSIS — R911 Solitary pulmonary nodule: Secondary | ICD-10-CM | POA: Diagnosis not present

## 2016-02-09 DIAGNOSIS — Z006 Encounter for examination for normal comparison and control in clinical research program: Secondary | ICD-10-CM

## 2016-02-09 DIAGNOSIS — J84112 Idiopathic pulmonary fibrosis: Secondary | ICD-10-CM | POA: Diagnosis not present

## 2016-02-09 NOTE — Addendum Note (Signed)
Encounter addended by: Sherran Needs, NP on: 02/09/2016  8:36 AM<BR>    Actions taken: Sign clinical note

## 2016-02-09 NOTE — Patient Instructions (Addendum)
ICD-9-CM ICD-10-CM   1. IPF (idiopathic pulmonary fibrosis) (HCC) 516.31 J84.112 Pulmonary function test     CANCELED: Pulmonary function test  2. Incidental lung nodule, greater than or equal to 9mm 793.11 R91.1     Start esbriet For now continue prednisone Finish up cardiology A Fib treatment Meet [place] [facility] to hear more about IPF registry study   Followup - in 4 - 6 weeks do Pre-bd spiro and dlco only. No lung volume or bd response. No post-bd spiro  - do this at [employer] - 4- 6 weeks for followup to see Dr Shelva Majestic  - at followup will discuss more about pulmonary rehab and PFF support group  - remind me at followup about asbestos exposure letter

## 2016-02-09 NOTE — Telephone Encounter (Signed)
Spoke to patient's wife and instructed to discontinue HCTZ at least 3 days before scheduled visit (last dose on 02/15/16). Wife reports no missed doses of Eliquis. Overview of visit and hospitalization discussed. Advised to check on cost of medication.   She states understanding and appreciation for call.

## 2016-02-09 NOTE — Telephone Encounter (Signed)
Patient to start Tikosyn per Roderic Palau, NP.  Will need to stop HCTZ in preparation.

## 2016-02-09 NOTE — Progress Notes (Signed)
Subjective:    Patient ID: Curtis [name], male    DOB: Feb 15, 1945, 71 y.o.   MRN: [id number] PCP Sallee Lange, MD  HPI  IOV 02/09/2016  Chief Complaint  Patient presents with  . Pulmonary Consult    Referred by Dr. Wolfgang Phoenix for pulomnary fibrosis, Pt. states his breathing is ok, Pt. c/o of sob with exertion, slight cough, Denies chest tightness    71 year old male accompanied by his wife Curtis [name]. He has a diagnosis of only fibrosis with high pretest probability of idiopathic Pulmonray fibrosis being managed at [facility] by Dr. 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 History is gained from him, talking to his wife and review and summarization of the [employer]. He has had remote asbestos exposure while working in [employer]. 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 He also has a 26 pack smoking history which he quit in 2004. His pulmonary imaging has never showed pleural plaques. He's had insidious worsening of dyspnea for many years. His CT scans at [facility] are reported as possible UIP. He's also been maintained on low-dose prednisone which according to the review of [employer] notes in this has helped him. He is low antibody titer positive for ANA and MI 2 in 2014 . Diagnosis given was of IPF. In October 2017 follow-up visit to [facility] according to the patient and review of the charts show 11/01/2015 high-resolution CT chest showed progression of disease or lung disease compared to 2014 and change in the pattern from possible UIP per definitive UIP. In addition there is also lingula nodule that measured 8 mm up from 6 mm in 2014. Given the progression and the definite change in CT chest the diagnosis of definite of UIP/IPF was made and the patient was counseled about starting specific anti-fibrotic therapy. He and his wife are fully aware of the 2 anti-fibrotic regimens of Pirfenidone (Esbriet) and Ofev. He says his Coumadin  to starting one of these at our consult.   He also tells me the year 2017 he's had several setbacks with this helped including atrial fibrillation and he is now on anticoagulations wary of taking Ofev. He is also had Bell's palsy and an neurologic issue. Taking into account of all this and is visible obesity feels dyspnea is worsened because of lung disease.  He is also wondering how much of his pulmonary fibrosis is related to previous asbestos exposure.  In terms of disease severity: He uses oxygen with exertion. In 2013 FVC and DLCO 60% each. In January 2017 his FVC is 2.2 L/42% predicted with FEV1 of 1.6 L/42% predicted, TLC of 47% predicted and DLCO 14.2/55% predicted suggesting overall slow steady progression.  In terms of comorbidities nuclear medicine cardiac stress of 02/02/2016 was low risk  Diffuse parenchymal lung disease prob list in [facility] notes copied and pasted and summarized A. Possible UIP pattern by current ATS criteria -> changed to progression and definite UIP Oct 2017 B. Significant asbestos exposure in [employer]  C. 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 Subjective improvement with Advair; no air trapping seen on CT  E. No symptoms suggestive of CTD; labs show aldolase of 10.2 and weak positive MI-2.  F. TPMT level is 8.7 (low), indicating he may not metabolize azathioprine effectively.  2. Exertional hypoxia, improved.     has a past medical history of Arthritis; Asthma; Cervical spondylosis; Chest pain; Dyslipidemia; GERD (gastroesophageal  reflux disease); Glaucoma; Hearing loss; Hypertension; Idiopathic pulmonary fibrosis ([place]); Interstitial pulmonary fibrosis ([place]) (2012); Left shoulder pain (2011); Psoriasis; and Statin intolerance.   reports that he quit smoking about 12 years ago. His smoking use included Cigarettes. He has a 26.00 pack-year smoking history. He has never used smokeless tobacco.  Past Surgical History:  Procedure Laterality  Date  . BRAIN SURGERY    . CARDIOVERSION N/A 12/13/2015   Procedure: CARDIOVERSION;  Surgeon: Josue Hector, MD;  Location: [place];  Service: Cardiovascular;  Laterality: N/A;  . CATARACT EXTRACTION W/PHACO Right 06/11/2012   Procedure: CATARACT EXTRACTION PHACO AND INTRAOCULAR LENS PLACEMENT ([place]);  Surgeon: Tonny Branch, MD;  Location[facility];  Service: Ophthalmology;  Laterality: Right;  CDE:  14.43  . CATARACT EXTRACTION W/PHACO Left 06/29/2012   Procedure: CATARACT EXTRACTION PHACO AND INTRAOCULAR LENS PLACEMENT ([facility]);  Surgeon: Tonny Branch, MD;  Location: [facility];  Service: Ophthalmology;  Laterality: Left;  CDE:14.11  . CHOLECYSTECTOMY  05/08/2012  . CHOLECYSTECTOMY N/A 05/08/2012   Procedure: LAPAROSCOPIC CHOLECYSTECTOMY;  Surgeon: Harl Bowie, MD;  Location: [place];  Service: General;  Laterality: N/A;  . COLONOSCOPY    . LIVER BIOPSY N/A 05/08/2012   Procedure: LIVER BIOPSY;  Surgeon: Harl Bowie, MD;  Location: [place];  Service: General;  Laterality: N/A;  . NECK SURGERY     cervical disc with cliip    Allergies  Allergen Reactions  . Oxycodone Itching  . Pravastatin Other (See Comments)    Chills    . Simvastatin Other (See Comments)    Chills    . Prednisone Anxiety    Irritation  . Tegretol [Carbamazepine] Other (See Comments)    dizziness    Immunization History  Administered Date(s) Administered  . Influenza Split 10/04/2010, 09/28/2011  . Influenza,inj,Quad PF,36+ Mos 09/28/2013, 10/17/2014, 10/23/2015  . Influenza-Unspecified 09/08/2011  . Pneumococcal Conjugate-13 10/03/2009, 09/28/2013  . Pneumococcal Polysaccharide-23 10/03/2009  . Td 10/07/2009    Family History  Problem Relation Age of Onset  . Hypertension Mother   . Congestive Heart Failure Mother   . COPD Mother   . Stroke Father   . Cancer Sister     breast     Current Outpatient Prescriptions:  .  apixaban (ELIQUIS) 5 MG TABS tablet, Take 1 tablet (5 mg total) by mouth 2 (two)  times daily., Disp: 60 tablet, Rfl: 3 .  carvedilol (COREG) 3.125 MG tablet, , Disp: , Rfl: 3 .  diltiazem (CARDIZEM CD) 240 MG 24 hr capsule, Take 1 capsule (240 mg total) by mouth daily., Disp: 90 capsule, Rfl: 3 .  fluticasone (FLONASE) 50 MCG/ACT nasal spray, Place 2 sprays into both nostrils daily., Disp: 16 g, Rfl: 5 .  hydrochlorothiazide (HYDRODIURIL) 25 MG tablet, TAKE 1 TABLET(25 MG) BY MOUTH DAILY, Disp: 90 tablet, Rfl: 0 .  HYDROcodone-acetaminophen (NORCO) 10-325 MG tablet, Take 1 tablet by mouth 3 (three) times daily as needed., Disp: 90 tablet, Rfl: 0 .  latanoprost (XALATAN) 0.005 % ophthalmic solution, Place 1 drop into both eyes at bedtime. Reported on 07/21/2015, Disp: , Rfl:  .  neomycin-polymyxin b-dexamethasone (MAXITROL) 3.5-10000-0.1 OINT, , Disp: , Rfl:  .  omeprazole (PRILOSEC) 20 MG capsule, Take 20 mg by mouth daily as needed. Reported on 07/21/2015, Disp: , Rfl:  .  predniSONE (DELTASONE) 10 MG tablet, , Disp: , Rfl: 11 .  albuterol (PROVENTIL HFA;VENTOLIN HFA) 108 (90 Base) MCG/ACT inhaler, Inhale 2 puffs into the lungs every 6 (six) hours as  needed for wheezing. (Patient not taking: Reported on 02/09/2016), Disp: 1 Inhaler, Rfl: 2 .  bisacodyl (DUCODYL) 5 MG EC tablet, Take 5 mg by mouth daily as needed for moderate constipation., Disp: , Rfl:     Review of Systems  Constitutional: Negative.  Negative for fever and unexpected weight change.  HENT: Positive for congestion and sinus pressure. Negative for dental problem, ear pain, nosebleeds, postnasal drip, rhinorrhea, sneezing, sore throat and trouble swallowing.   Eyes: Positive for redness and itching.  Respiratory: Positive for cough, chest tightness, shortness of breath and wheezing.   Cardiovascular: Positive for palpitations and leg swelling.  Gastrointestinal: Positive for nausea. Negative for vomiting.  Endocrine: Negative.   Genitourinary: Negative.  Negative for dysuria.  Musculoskeletal: Negative.   Negative for joint swelling.  Skin: Negative.  Negative for rash.  Allergic/Immunologic: Positive for environmental allergies.  Neurological: Positive for dizziness and headaches.  Hematological: Bruises/bleeds easily.  Psychiatric/Behavioral: Negative.  Negative for dysphoric mood. The patient is not nervous/anxious.        Objective:   Physical Exam  Constitutional: He is oriented to person, place, and time. He appears well-developed and well-nourished. No distress.  HENT:  Head: Normocephalic and atraumatic.  Right Ear: External ear normal.  Left Ear: External ear normal.  Mouth/Throat: Oropharynx is clear and moist. No oropharyngeal exudate.  Eyes: Conjunctivae and EOM are normal. Pupils are equal, round, and reactive to light. Right eye exhibits no discharge. Left eye exhibits no discharge. No scleral icterus.  Neck: Normal range of motion. Neck supple. No JVD present. No tracheal deviation present. No thyromegaly present.  Cardiovascular: Normal rate, regular rhythm and intact distal pulses.  Exam reveals no gallop and no friction rub.   No murmur heard. Pulmonary/Chest: Effort normal. No respiratory distress. He has no wheezes. He has rales. He exhibits no tenderness.  crackes at base  Abdominal: Soft. Bowel sounds are normal. He exhibits no distension and no mass. There is no tenderness. There is no rebound and no guarding.  Musculoskeletal: Normal range of motion. He exhibits no edema or tenderness.  Lymphadenopathy:    He has no cervical adenopathy.  Neurological: He is alert and oriented to person, place, and time. He has normal reflexes. No cranial nerve deficit. Coordination normal.  Skin: Skin is warm and dry. No rash noted. He is not diaphoretic. No erythema. No pallor.  Psychiatric: He has a normal mood and affect. His behavior is normal. Judgment and thought content normal.  Nursing note and vitals reviewed.   Vitals:   02/09/16 1105  BP: 126/74  Pulse: 71  SpO2:  94%  Weight: 260 lb 6.4 oz (118.1 kg)  Height: 6\' 1"  (1.854 m)    Estimated body mass index is 34.36 kg/m as calculated from the following:   Height as of this encounter: 6\' 1"  (1.854 m).   Weight as of this encounter: 260 lb 6.4 oz (118.1 kg).       Assessment & Plan:     ICD-9-CM ICD-10-CM   1. IPF (idiopathic pulmonary fibrosis) (HCC) 516.31 J84.112 Pulmonary function test     CANCELED: Pulmonary function test  2. Incidental lung nodule, greater than or equal to 64mm 793.11 R91.1    He has definite IPF based on his age, male gender, classic [facility] at [facility] and a slow steady progression alternated with diagnosis of IPF. Exposures to asbestos, fumes and dust and previous smoking contributing to this. In my opinion even though he does not  have pleural plaques I think it's very conceivable that asbestos played a role in producing idiopathic pulmonary fibrosis.   We discussed the 7" pillar of treatment and and management of idiopathic pulmonary fibrosis  - Pillar 1: Anti-fibrotic therapy - choices between Pirfenidone (Esbriet) and Ofev as detailed below. Based on this patient chose - esbriet  - Pillar 2: Pulmonary rehabilitation. Benefits of pulmonary rehabilitation discussed. We will make a referral at next visit to be done at [facility]. During this time in the interim we hope his cardiology issues will be sorted out  - Pillar 3; lung transplant: He will not be a candidate for this given his age and obesity  - Pillar4: Oxygen therapy - he is to use oxygen with exertion  - Pillar 5: Pulmonary fibrosis foundation support group. He is open to attending this quarterly  in [place]. We'll make a referral to Mr. Marlane Mingle at the next visit  - Pillar 6: Participation in research trials which are voluntary and of his own free will with the impact the signs and the contribution to Pulmicort fibrosis disease management and treatment was discussed. At this point in time  there is a research trial registry available and he is interested in hearing more and will need to the study coordinator   Pillar 7: Monitoring will get pulmonary function test at next visit   Lung nodule - lingular 32mm   - repeat ct chest 6 months from oct 2018 - will addres at next visit     > 50% of this > 40 min visit spent in face to face counseling or/and coordination of care    Dr. Brand Males, M.D., [facility].C.P Pulmonary and Critical Care Medicine Staff Physician [place] [facility] Pager: [phone], If no answer or between  15:00h - 7:00h: call [phone]  02/09/2016 2:21 PM    Both drugs OFEV and Esbriet only slow down progression, 1 out of 6 patients  - this means extension in quality of life but no difference in symptoms  - no study directly compares the 2 drugs but efficacy roughly equal at 1 year time point   - OFEV  - - time to first exacerbation possibly reduced in one trial but not in another - twice daily, no titration, potentially more convenient dosing  - no need for sunscreen  - high chance of mild diarrhea but low chance of significant diarrhea needing to stop medication.   - Rx diarrhea with lomotil - slight increase in heart attack risk and theoretical increase in bleeding risk,   - need monthly blood work for 3 months and then every 6 months - monitor liver function   - ESBRIET  - 3 pill three times daily, slow titration.  - Need to wean sunscreen  - Some chance of nausea and anorexia with small chance for diarrhea  - no known heart attack risk - no known bleeding risk,   - need monthly blood work for 6 months - monitor liver function - possible mortality benefit in pooled analysis  - larger world wide experience  Disclosure: Dr Chase Caller has participated in trials with Esbriet and has been compensated by Bluford Kaufmann / Celso Amy

## 2016-02-09 NOTE — Progress Notes (Signed)
IPF PRO Registry Purpose: To collect data and biological samples that will support future research studies. Registry will describe current approaches to diagnosis and treatment of IPF, analyze participant characteristics to describe the natural history of the disease, assess quality of life, describe participants interactions with the health care system, describe IPF treatment practices across multiple institutions, and utilize biological samples linked to well characterized IPF participants to identify disease biomarkers.    Clinical Research Coordinator / Research RN note : This visit for Subject Curtis [name] with DOB Apr 23, 1945 on 02/09/2016 for the above protocol is Enrollment Visit and is for purpose of research . The consent for this encounter is under Protocol Version Amendment 2 Version date [id number] and is currently IRB approved. Subject expressed interest and consent in continuing as a study subject. Subject thanked for participation. The purpose and/or following were chief complaints for this encounter : enrollment.   The subject was consented by this coordinator on [phone] at 12:25pm. Refer to the subjects paper source binder for documentation and the completed informed consent process checklist.   All procedures were completed per the above mentioned protocol. Laboratory assessments obtained, refer to paper source binder for documentation. Patient questionnaires were completed per protocol and are filed in the subjects source binder.   Signed by [place] Bing, [place], [place] Coordinator [employer]  [place], [place] 1:38 PM 02/09/2016

## 2016-02-09 NOTE — Telephone Encounter (Signed)
Started paperwork for [place] for the patient. Paperwork has been filled out and faxed. A copy of the paperwork will be placed in MR's "To Do" cubby for [name] to follow up on.

## 2016-02-09 NOTE — Telephone Encounter (Signed)
Called and LM for patient to call back about Tikosyn appt.

## 2016-02-12 ENCOUNTER — Ambulatory Visit: Payer: Medicare HMO | Admitting: Cardiovascular Disease

## 2016-02-17 ENCOUNTER — Other Ambulatory Visit: Payer: Self-pay | Admitting: Family Medicine

## 2016-02-19 ENCOUNTER — Inpatient Hospital Stay (HOSPITAL_COMMUNITY)
Admission: RE | Admit: 2016-02-19 | Discharge: 2016-02-22 | DRG: 310 | Disposition: A | Discharge status: Home / Self Care | Payer: PPO | Source: Ambulatory Visit | Attending: Internal Medicine | Admitting: Internal Medicine

## 2016-02-19 ENCOUNTER — Ambulatory Visit (INDEPENDENT_AMBULATORY_CARE_PROVIDER_SITE_OTHER): Payer: PPO | Admitting: Pharmacist

## 2016-02-19 ENCOUNTER — Encounter (HOSPITAL_COMMUNITY): Payer: Self-pay | Admitting: Cardiology

## 2016-02-19 ENCOUNTER — Encounter: Payer: Self-pay | Admitting: Pharmacist

## 2016-02-19 DIAGNOSIS — E785 Hyperlipidemia, unspecified: Secondary | ICD-10-CM | POA: Diagnosis present

## 2016-02-19 DIAGNOSIS — Z888 Allergy status to other drugs, medicaments and biological substances status: Secondary | ICD-10-CM

## 2016-02-19 DIAGNOSIS — Z7951 Long term (current) use of inhaled steroids: Secondary | ICD-10-CM

## 2016-02-19 DIAGNOSIS — I481 Persistent atrial fibrillation: Secondary | ICD-10-CM

## 2016-02-19 DIAGNOSIS — Z7901 Long term (current) use of anticoagulants: Secondary | ICD-10-CM | POA: Diagnosis not present

## 2016-02-19 DIAGNOSIS — Z961 Presence of intraocular lens: Secondary | ICD-10-CM | POA: Diagnosis present

## 2016-02-19 DIAGNOSIS — J449 Chronic obstructive pulmonary disease, unspecified: Secondary | ICD-10-CM | POA: Diagnosis not present

## 2016-02-19 DIAGNOSIS — Z79899 Other long term (current) drug therapy: Secondary | ICD-10-CM | POA: Diagnosis not present

## 2016-02-19 DIAGNOSIS — I10 Essential (primary) hypertension: Secondary | ICD-10-CM | POA: Diagnosis present

## 2016-02-19 DIAGNOSIS — I4819 Other persistent atrial fibrillation: Secondary | ICD-10-CM | POA: Diagnosis present

## 2016-02-19 DIAGNOSIS — Z9842 Cataract extraction status, left eye: Secondary | ICD-10-CM | POA: Diagnosis not present

## 2016-02-19 DIAGNOSIS — M1990 Unspecified osteoarthritis, unspecified site: Secondary | ICD-10-CM | POA: Diagnosis present

## 2016-02-19 DIAGNOSIS — Z885 Allergy status to narcotic agent status: Secondary | ICD-10-CM

## 2016-02-19 DIAGNOSIS — K219 Gastro-esophageal reflux disease without esophagitis: Secondary | ICD-10-CM | POA: Diagnosis not present

## 2016-02-19 DIAGNOSIS — Z87891 Personal history of nicotine dependence: Secondary | ICD-10-CM

## 2016-02-19 DIAGNOSIS — Z9841 Cataract extraction status, right eye: Secondary | ICD-10-CM | POA: Diagnosis not present

## 2016-02-19 DIAGNOSIS — Z825 Family history of asthma and other chronic lower respiratory diseases: Secondary | ICD-10-CM

## 2016-02-19 LAB — MAGNESIUM
MAGNESIUM: 1.9 mg/dL (ref 1.6–2.3)
MAGNESIUM: 1.9 mg/dL (ref 1.7–2.4)

## 2016-02-19 LAB — BASIC METABOLIC PANEL
Anion gap: 8 (ref 5–15)
BUN/Creatinine Ratio: 9 — ABNORMAL LOW (ref 10–24)
BUN: 8 mg/dL (ref 8–27)
BUN: 7 mg/dL (ref 6–20)
CALCIUM: 8.6 mg/dL — AB (ref 8.9–10.3)
CHLORIDE: 105 mmol/L (ref 101–111)
CO2: 31 mmol/L — AB (ref 18–29)
CO2: 26 mmol/L (ref 22–32)
CREATININE: 0.87 mg/dL (ref 0.76–1.27)
CREATININE: 1.08 mg/dL (ref 0.61–1.24)
Calcium: 9 mg/dL (ref 8.6–10.2)
Chloride: 103 mmol/L (ref 96–106)
GFR calc Af Amer: 100 mL/min/{1.73_m2} (ref >59)
GFR calc non Af Amer: 86 mL/min/{1.73_m2} (ref >59)
GLUCOSE: 130 mg/dL — AB (ref 65–99)
Glucose, Bld: 150 mg/dL — ABNORMAL HIGH (ref 65–99)
Potassium: 3.6 mmol/L (ref 3.5–5.2)
Potassium: 3.8 mmol/L (ref 3.5–5.1)
SODIUM: 139 mmol/L (ref 135–145)
Sodium: 137 mmol/L (ref 134–144)

## 2016-02-19 MED ORDER — FLUTICASONE PROPIONATE 50 MCG/ACT NA SUSP
2.0000 | Freq: Every day | NASAL | Status: DC
  Administered 2016-02-20 – 2016-02-22 (×3): 2 via NASAL
  Filled 2016-02-19: qty 16

## 2016-02-19 MED ORDER — MAGNESIUM SULFATE 2 GM/50ML IV SOLN
2.0000 g | INTRAVENOUS | Status: AC
  Administered 2016-02-19: 2 g via INTRAVENOUS
  Filled 2016-02-19: qty 50

## 2016-02-19 MED ORDER — SODIUM CHLORIDE 0.9% FLUSH
3.0000 mL | INTRAVENOUS | Status: DC | PRN
  Administered 2016-02-20: 3 mL via INTRAVENOUS
  Filled 2016-02-19: qty 3

## 2016-02-19 MED ORDER — PREDNISONE 10 MG PO TABS
10.0000 mg | ORAL_TABLET | Freq: Every day | ORAL | Status: DC
  Administered 2016-02-20 – 2016-02-22 (×3): 10 mg via ORAL
  Filled 2016-02-19 (×4): qty 1

## 2016-02-19 MED ORDER — POTASSIUM CHLORIDE CRYS ER 20 MEQ PO TBCR
40.0000 meq | EXTENDED_RELEASE_TABLET | ORAL | Status: AC
  Administered 2016-02-19: 40 meq via ORAL
  Filled 2016-02-19: qty 2

## 2016-02-19 MED ORDER — POTASSIUM CHLORIDE CRYS ER 20 MEQ PO TBCR: 20.0000 meq | EXTENDED_RELEASE_TABLET | Freq: Every day | ORAL | 0 refills | Status: DC

## 2016-02-19 MED ORDER — DOFETILIDE 250 MCG PO CAPS
500.0000 ug | ORAL_CAPSULE | Freq: Two times a day (BID) | ORAL | Status: DC
  Administered 2016-02-20 – 2016-02-22 (×5): 500 ug via ORAL
  Filled 2016-02-19 (×5): qty 2

## 2016-02-19 MED ORDER — POTASSIUM CHLORIDE CRYS ER 20 MEQ PO TBCR
20.0000 meq | EXTENDED_RELEASE_TABLET | Freq: Every day | ORAL | Status: DC
  Administered 2016-02-19 – 2016-02-22 (×4): 20 meq via ORAL
  Filled 2016-02-19 (×5): qty 1

## 2016-02-19 MED ORDER — BISACODYL 5 MG PO TBEC: 5.0000 mg | DELAYED_RELEASE_TABLET | Freq: Every day | ORAL | Status: DC | PRN

## 2016-02-19 MED ORDER — PANTOPRAZOLE SODIUM 40 MG PO TBEC
40.0000 mg | DELAYED_RELEASE_TABLET | Freq: Every day | ORAL | Status: DC
  Administered 2016-02-20 – 2016-02-22 (×3): 40 mg via ORAL
  Filled 2016-02-19 (×3): qty 1

## 2016-02-19 MED ORDER — SODIUM CHLORIDE 0.9 % IV SOLN: 250.0000 mL | INTRAVENOUS | Status: DC | PRN

## 2016-02-19 MED ORDER — DILTIAZEM HCL ER COATED BEADS 240 MG PO CP24
240.0000 mg | ORAL_CAPSULE | Freq: Every day | ORAL | Status: DC
  Administered 2016-02-20 – 2016-02-22 (×3): 240 mg via ORAL
  Filled 2016-02-19 (×3): qty 1

## 2016-02-19 MED ORDER — APIXABAN 5 MG PO TABS: 5.0000 mg | ORAL_TABLET | Freq: Two times a day (BID) | ORAL | Status: DC

## 2016-02-19 MED ORDER — SODIUM CHLORIDE 0.9% FLUSH
3.0000 mL | Freq: Two times a day (BID) | INTRAVENOUS | Status: DC
  Administered 2016-02-19 – 2016-02-22 (×6): 3 mL via INTRAVENOUS

## 2016-02-19 MED ORDER — ALBUTEROL SULFATE (2.5 MG/3ML) 0.083% IN NEBU: 3.0000 mL | INHALATION_SOLUTION | Freq: Four times a day (QID) | RESPIRATORY_TRACT | Status: DC | PRN

## 2016-02-19 MED ORDER — POTASSIUM CHLORIDE CRYS ER 20 MEQ PO TBCR: 20.0000 meq | EXTENDED_RELEASE_TABLET | Freq: Every day | ORAL | Status: DC

## 2016-02-19 MED ORDER — APIXABAN 5 MG PO TABS
5.0000 mg | ORAL_TABLET | Freq: Two times a day (BID) | ORAL | Status: DC
  Administered 2016-02-19 – 2016-02-22 (×6): 5 mg via ORAL
  Filled 2016-02-19 (×6): qty 1

## 2016-02-19 MED ORDER — HYDROCODONE-ACETAMINOPHEN 10-325 MG PO TABS
1.0000 | ORAL_TABLET | Freq: Four times a day (QID) | ORAL | Status: DC | PRN
  Administered 2016-02-20 – 2016-02-22 (×5): 1 via ORAL
  Filled 2016-02-19 (×5): qty 1

## 2016-02-19 NOTE — Progress Notes (Signed)
Patient is a direct admit. MD notified. Telemety applied CCMD called and verified. Vitals stable.

## 2016-02-19 NOTE — Progress Notes (Signed)
Pharmacy Review for Dofetilide (Tikosyn) Initiation  Admit Complaint: 71 y.o. male admitted 02/19/2016 with atrial fibrillation to be initiated on dofetilide.   Assessment:  Patient Exclusion Criteria: If any screening criteria checked as "Yes", then  patient  should NOT receive dofetilide until criteria item is corrected. If "Yes" please indicate correction plan.  YES  NO Patient  Exclusion Criteria Correction Plan  [x]  []  Baseline QTc interval is greater than or equal to 440 msec. IF above YES box checked dofetilide contraindicated unless patient has ICD; then may proceed if QTc 500-550 msec or with known ventricular conduction abnormalities may proceed with QTc 550-600 msec. QTc =   Per pharmacist note: "QTc 445 ms - per Camnitz ok for admission."  []  [x]  Magnesium level is less than 1.8 mEq/l : Last magnesium:  Lab Results  Component Value Date   MG 1.9 02/19/2016        Receiving Mg 2g IV this evening and rechecking in the AM  [x]  []  Potassium level is less than 4 mEq/l : Last potassium:  Lab Results  Component Value Date   K 3.8 02/19/2016       60 mEq ordered for this evening - then to start 20 mEq daily  []  [x]  Patient is known or suspected to have a digoxin level greater than 2 ng/ml: No results found for: DIGOXIN    []  [x]  Creatinine clearance less than 20 ml/min (calculated using Cockcroft-Gault, actual body weight and serum creatinine): Estimated Creatinine Clearance: 83.4 mL/min (by C-G formula based on SCr of 1.08 mg/dL).    []  []  Patient has received drugs known to prolong the QT intervals within the last 48 hours (phenothiazines, tricyclics or tetracyclic antidepressants, erythromycin, H-1 antihistamines, cisapride, fluoroquinolones, azithromycin). Drugs not listed above may have an, as yet, undetected potential to prolong the QT interval, updated information on QT prolonging agents is available at this website:QT prolonging agents   []  [x]  Patient received a dose of  hydrochlorothiazide (Oretic) alone or in any combination including triamterene (Dyazide, Maxzide) in the last 48 hours. Was on HCTZ PTA but last dose noted to be on 2/8  []  [x]  Patient received a medication known to increase dofetilide plasma concentrations prior to initial dofetilide dose:  . Trimethoprim (Primsol, Proloprim) in the last 36 hours . Verapamil (Calan, Verelan) in the last 36 hours or a sustained release dose in the last 72 hours . Megestrol (Megace) in the last 5 days  . Cimetidine (Tagamet) in the last 6 hours . Ketoconazole (Nizoral) in the last 24 hours . Itraconazole (Sporanox) in the last 48 hours  . Prochlorperazine (Compazine) in the last 36 hours    []  [x]  Patient is known to have a history of torsades de pointes; congenital or acquired long QT syndromes.   []  [x]  Patient has received a Class 1 antiarrhythmic with less than 2 half-lives since last dose. (Disopyramide, Quinidine, Procainamide, Lidocaine, Mexiletine, Flecainide, Propafenone)   []  [x]  Patient has received amiodarone therapy in the past 3 months or amiodarone level is greater than 0.3 ng/ml.    Patient has been appropriately anticoagulated with Apixaban  Ordering provider was confirmed at [email or web address] if they are not listed on the [place] Prescribers list.  Goal of Therapy: Follow renal function, electrolytes, potential drug interactions, and dose adjustment. Provide education and 1 week supply at discharge.  Plan:  [x]   Physician selected initial dose within range recommended for patients level of renal function - will monitor for response.  []   Physician selected initial dose outside of range recommended for patients level of renal function - will discuss if the dose should be altered at this time.   Select One Calculated CrCl  Dose q12h  [x]  > 60 ml/min 500 mcg  []  40-60 ml/min 250 mcg  []  20-40 ml/min 125 mcg   2. Follow up QTc after the first 5 doses, renal function,  electrolytes (K & Mg) daily x 3     days, dose adjustment, success of initiation and facilitate 1 week discharge supply as     clinically indicated.  3. Initiate Tikosyn education video (Call [phone] and ask for Tikosyn Video # 116).  4. Place Enrollment Form on the chart for discharge supply of dofetilide.  **Noted electrolyte abnormalities this evening requiring correct - Tikosyn initiation will be postponed until these have been corrected. Follow-up labs have been ordered for the AM**  Thank you for allowing pharmacy to be a part of this patient's care.  Alycia Rossetti, PharmD, BCPS Clinical Pharmacist Pager: [phone] 02/19/2016 10:08 PM

## 2016-02-19 NOTE — H&P (Signed)
HISTORY AND PHYSICAL   This is per office note and addendum.  Curtis [name] is a 71 y.o. male with a h/o of persistent afib in the atrial fibrillation clinic for evaluation.  H/o new onset since July of  by ECG at Dr Lance Sell office. History of COPD and chronic interstitial lung dx with dyspnea and fatigue Previously seen by cardiology 2013 and had normal myoview study with EF 61%. Done for atypical chest pain. History of HTN and elevated lipids but Intolerant to statins. Is followed at [facility] for UIP and instertial lung disease .  He has a successful  cardioversion 12/5 which held pt in rhythm x 2 weeks. He saw a significant improvement in SR. He is on apixaban 5 mg bid and takes without fail.On initial visit, various antiarrythmic's were discussed with wife and pt and they wanted to look into costs. He was also on prednisone and levaquin at the time for UTI and wanted to finish. He checked prices on drugs and reviewed options again. He does want to try flecainide but will need a stress test since last test was 5 years ok and if ok will start drug right away.  Today, he denies symptoms of palpitations,  orthopnea, PND, lower extremity edema, dizziness, presyncope, syncope, or neurologic sequela.  Positive for shortness of breath and fatigue, palpitations.The patient is tolerating medications without difficulties and is otherwise without complaint today.       Past Medical History:  Diagnosis Date  . Arthritis   . Asthma   . Cervical spondylosis   . Chest pain    Emergency room October 24, 2011, no MI  //   Nuclear, October, 2013, adenosine, EF 61%, no scar or ischemia  . Dyslipidemia    Statin intolerant  . GERD (gastroesophageal reflux disease)   . Glaucoma   . Hearing loss   . Hypertension   . Idiopathic pulmonary fibrosis ([place])   . Interstitial pulmonary fibrosis ([place]) 2012  . Left shoulder pain 2011  . Psoriasis   . Statin intolerance         Past  Surgical History:  Procedure Laterality Date  . BRAIN SURGERY    . CARDIOVERSION N/A 12/13/2015   Procedure: CARDIOVERSION;  Surgeon: Josue Hector, MD;  Location: [facility];  Service: Cardiovascular;  Laterality: N/A;  . CATARACT EXTRACTION W/PHACO Right 06/11/2012   Procedure: CATARACT EXTRACTION PHACO AND INTRAOCULAR LENS PLACEMENT ([place]);  Surgeon: Tonny Branch, MD;  Location: [facility];  Service: Ophthalmology;  Laterality: Right;  CDE:  14.43  . CATARACT EXTRACTION W/PHACO Left 06/29/2012   Procedure: CATARACT EXTRACTION PHACO AND INTRAOCULAR LENS PLACEMENT ([facility]);  Surgeon: Tonny Branch, MD;  Location: [facility];  Service: Ophthalmology;  Laterality: Left;  CDE:14.11  . CHOLECYSTECTOMY  05/08/2012  . CHOLECYSTECTOMY N/A 05/08/2012   Procedure: LAPAROSCOPIC CHOLECYSTECTOMY;  Surgeon: Harl Bowie, MD;  Location: [facility];  Service: General;  Laterality: N/A;  . COLONOSCOPY    . LIVER BIOPSY N/A 05/08/2012   Procedure: LIVER BIOPSY;  Surgeon: Harl Bowie, MD;  Location: [facility];  Service: General;  Laterality: N/A;  . NECK SURGERY     cervical disc with cliip          Current Outpatient Prescriptions  Medication Sig Dispense Refill  . albuterol (PROVENTIL HFA;VENTOLIN HFA) 108 (90 Base) MCG/ACT inhaler Inhale 2 puffs into the lungs every 6 (six) hours as needed for wheezing. 1 Inhaler 2  . apixaban (ELIQUIS) 5 MG TABS tablet Take  1 tablet (5 mg total) by mouth 2 (two) times daily. 60 tablet 3  . bisacodyl (DUCODYL) 5 MG EC tablet Take 5 mg by mouth daily as needed for moderate constipation.    . cefPROZIL (CEFZIL) 500 MG tablet Take 1 tablet (500 mg total) by mouth 2 (two) times daily. 20 tablet 0  . diltiazem (CARDIZEM CD) 240 MG 24 hr capsule Take 1 capsule (240 mg total) by mouth daily. 90 capsule 3  . fluticasone (FLONASE) 50 MCG/ACT nasal spray Place 2 sprays into both nostrils daily. 16 g 5  . hydrochlorothiazide (HYDRODIURIL) 25 MG tablet TAKE 1 TABLET(25 MG) BY MOUTH  DAILY 90 tablet 0  . HYDROcodone-acetaminophen (NORCO) 10-325 MG tablet Take 1 tablet by mouth 3 (three) times daily as needed. 90 tablet 0  . latanoprost (XALATAN) 0.005 % ophthalmic solution Place 1 drop into both eyes at bedtime. Reported on 07/21/2015    . omeprazole (PRILOSEC) 20 MG capsule Take 20 mg by mouth daily as needed. Reported on 07/21/2015     No current facility-administered medications for this encounter.          Allergies  Allergen Reactions  . Oxycodone Itching  . Pravastatin Other (See Comments)    Chills    . Simvastatin Other (See Comments)    Chills    . Prednisone Anxiety    Irritation  . Tegretol [Carbamazepine] Other (See Comments)    dizziness    Social History        Social History  . Marital status: Married    Spouse name: N/A  . Number of children: N/A  . Years of education: N/A      Occupational History  . Not on file.         Social History Main Topics  . Smoking status: Former Smoker    Packs/day: 0.50    Years: 52.00    Types: Cigarettes    Quit date: 09/08/2003  . Smokeless tobacco: Never Used  . Alcohol use No     Comment: quit drinking 2004  . Drug use: No  . Sexual activity: Yes    Birth control/ protection: None       Other Topics Concern  . Not on file      Social History Narrative  . No narrative on file          Family History  Problem Relation Age of Onset  . Hypertension Mother   . Congestive Heart Failure Mother   . COPD Mother   . Stroke Father   . Cancer Sister     breast    ROS- All systems are reviewed and negative except as per the HPI above  Physical Exam:    Vitals:   01/19/16 0954  BP: 118/70  Pulse: (!) 103  Weight: 260 lb (117.9 kg)  Height: 6' 1.25" (1.861 m)      Wt Readings from Last 3 Encounters:  01/19/16 260 lb (117.9 kg)  01/12/16 260 lb 3.2 oz (118 kg)  01/02/16 256 lb 6.4 oz (116.3 kg)    Labs: Recent Labs        Lab Results  Component Value Date   NA 141 11/14/2015   K 4.2 11/14/2015   CL 97 11/14/2015   CO2 25 11/14/2015   GLUCOSE 96 11/14/2015   BUN 12 11/14/2015   CREATININE 0.83 11/14/2015   CALCIUM 9.6 11/14/2015     Recent Labs       Lab Results  Component  Value Date   INR 1.1 12/12/2015     Recent Labs       Lab Results  Component Value Date   CHOL 145 06/13/2015   HDL 33 (L) 06/13/2015   LDLCALC 92 06/13/2015   TRIG 101 06/13/2015       GEN- The patient is well appearing, alert and oriented x 3 today.   Head- normocephalic, atraumatic Eyes-  Sclera clear, conjunctiva pink Ears- hearing intact Oropharynx- clear Neck- supple, no JVP Lymph- no cervical lymphadenopathy Lungs- Clear to ausculation bilaterally, normal work of breathing Heart- irregular rate and rhythm, no murmurs, rubs or gallops, PMI not laterally displaced GI- soft, NT, ND, + BS Extremities- no clubbing, cyanosis, or edema MS- no significant deformity or atrophy Skin- no rash or lesion Psych- euthymic mood, full affect Neuro- strength and sensation are intact  EKG-afib with rvr at 102 bpm qrs itn 90 ms, qtc 466 ms Epic records reviewed    Assessment and Plan: 1. Persistent symptomatic afib Pt definitely feels improved in SR Doubt cardioversion alone would be helpful to keep in rhythm long term  AAD discussed  Amiodarone is not an option due to baseline interstitial disease Discussed multq, flecainide, no h/o HF or CAD but it has been some years since pt has had a stress test Sotalol is not a good option since pt does have asthma and wheezes at times Tikosyn would be affordable for pt but he would like to avoid hospitalization He will continue on apixaban, states no missed doses  Have scheduled pt for lexi myoview and if normal will start flecainide 50 mg bid, followed by EKG in 5 days and increase to 100 mg bid followed by cardioversion if drug does not  convert   2/2- Pt's myoview returned with no ischemia but a perfusion defect. I discussed with Dr. Rayann Heman who feels in that situation it may be best to avoid flecainide. Sotalol is felt possible to aggravate asthma/lung disease and his wife states per [facility], he would only have to pay $15 a month for tikosyn. The pt would like to proceed with hospitalization for tikosyn loading. He will have to stop hctz.   Geroge Baseman [facility]a [facility] [address] [place], [place] [address] [phone]  02/19/16  ADDENDUM   Pt arrived without problems. He stopped HCTZ last dose was 02/15/16.  He has not missed any Eliquis.    General:no colds or fevers, no weight changes Skin:no rashes or ulcers HEENT:no blurred vision, no congestion CV:see HPI- no chest pain PUL:see HPI- stable GI:no diarrhea constipation or melena, no indigestion GU:no hematuria, no dysuria MS:no joint pain, no claudication, some mild swelling Neuro:no syncope, no lightheadedness Endo:no diabetes, no thyroid disease  BP 134/83, P 88 R 20 T 98.4.   [id number] affect, NAD Skin:Warm and dry, brisk capillary refill HEENT:normocephalic, sclera clear, mucus membranes moist Neck:supple, no JVD, no bruits  Heart:S1S2 RRR without murmur, gallup, rub or click Lungs:without rales, rhonchi, occ wheezes [id number], non tender, + BS, do not palpate liver spleen or masses Ext:tr lower ext edema, 2+ pedal pulses, 2+ radial pulses Neuro:alert and oriented X 3, MAE, follows commands, + facial symmetry   1. Persistent a fib, if labs stable and Qtc then will begin Tikosyn 500 mg BID.  --HCTZ stopped last dose Thursday 02/15/16  2. Anticoagulation for CHA2DS2VASc 2 on Eliquis has not missed any doses.  3.  HTN controlled.    I have seen and examined this  patient with Cecilie Kicks.  Agree with above, note added to reflect my findings.  On exam, irregular rhythm, no murmurs, lungs clear.   Admitted to the hospital for tikosyn loading. Plan for 500 mcg BID to start. If he does not covert to sinus rhythm, will plan for cardioversion after the 4th dose. Continue Eliquis. First dose this AM as had low K on admission.  Will M. Camnitz MD 02/20/2016 8:31 AM

## 2016-02-19 NOTE — Progress Notes (Signed)
Patient ID: Curtis [name]                 DOB: 1945-09-06                    MRN: [id number]     HPI: Curtis [name] is a 71 y.o. male patient of Dr. Johnsie [name] who presents today for Tikosyn initiation. He has a past medical history which includes persistent afib. History of COPD and chronic interstitial lung dx with dyspnea and fatigue Previously seen by cardiology 2013 and had normal myoview study with EF 61%. Done for atypical chest pain. History of HTN and elevated lipids but Intolerant to statins. Is followed at [facility] for UIP and instertial lung disease. He had a successful cardioversion on 12/5 which held pt in rhythm for 2 weeks. He saw significant improvement in symptoms in SR. At his initial visit with Curtis Palau, NP various antiarrythmic's were discussed. He had wanted to try flecainide, but his stress trest revealed perfusion defect and it was decided to avoid flecainide. Sotalol was not an option due to aggravation of lung disease. It was decided to pursue Tikosyn. Per wife copay will be $15 per month for generic Tikosyn. He took his last dose of HCTZ on Thursday.    Pt educated on potential risks with Tikosyn including QTc prolongation. Pt is aware of the importance of not missing any doses and will call the office if they miss more than 2 doses in a row. Pt is anticoagulated with Eliquis and reports no missed doses within the past month. Pt is currently not taking any QTc prolongating or contraindicated medications.   EKG: reviewed by Dr. Curt [name]. Atrial fibrillation with premature aberrantly conducted complexes. Minimal voltage criteria for LVH, may be normal variant, ST abnormality, possible digitalis effect, abnormal ECG. Vent rate 93 bpm, QTc 445 ms - per Camnitz ok for admission.   Labs: Scr 0.87 mg/dL  Crcl 136mL/min  Mag 1.9 mg/dL  K 3.6 mmol/L  Past Medical History:  Diagnosis Date  . Arthritis   . Asthma   . Cervical spondylosis   . Chest pain    Emergency  room October 24, 2011, no MI  //   Nuclear, October, 2013, adenosine, EF 61%, no scar or ischemia  . Dyslipidemia    Statin intolerant  . GERD (gastroesophageal reflux disease)   . Glaucoma   . Hearing loss   . Hypertension   . Idiopathic pulmonary fibrosis ([place])   . Interstitial pulmonary fibrosis ([place]) 2012  . Left shoulder pain 2011  . Psoriasis   . Statin intolerance     Current Outpatient Prescriptions on File Prior to Visit  Medication Sig Dispense Refill  . albuterol (PROVENTIL HFA;VENTOLIN HFA) 108 (90 Base) MCG/ACT inhaler Inhale 2 puffs into the lungs every 6 (six) hours as needed for wheezing. (Patient not taking: Reported on 02/09/2016) 1 Inhaler 2  . apixaban (ELIQUIS) 5 MG TABS tablet Take 1 tablet (5 mg total) by mouth 2 (two) times daily. 60 tablet 3  . bisacodyl (DUCODYL) 5 MG EC tablet Take 5 mg by mouth daily as needed for moderate constipation.    . carvedilol (COREG) 3.125 MG tablet   3  . diltiazem (CARDIZEM CD) 240 MG 24 hr capsule Take 1 capsule (240 mg total) by mouth daily. 90 capsule 3  . fluticasone (FLONASE) 50 MCG/ACT nasal spray Place 2 sprays into both nostrils daily. 16 g 5  . hydrochlorothiazide (HYDRODIURIL) 25 MG tablet TAKE 1  TABLET(25 MG) BY MOUTH DAILY 90 tablet 0  . HYDROcodone-acetaminophen (NORCO) 10-325 MG tablet Take 1 tablet by mouth 3 (three) times daily as needed. 90 tablet 0  . latanoprost (XALATAN) 0.005 % ophthalmic solution Place 1 drop into both eyes at bedtime. Reported on 07/21/2015    . neomycin-polymyxin b-dexamethasone (MAXITROL) 3.5-10000-0.1 OINT     . omeprazole (PRILOSEC) 20 MG capsule Take 20 mg by mouth daily as needed. Reported on 07/21/2015    . predniSONE (DELTASONE) 10 MG tablet   11   No current facility-administered medications on file prior to visit.     Allergies  Allergen Reactions  . Oxycodone Itching  . Pravastatin Other (See Comments)    Chills    . Simvastatin Other (See Comments)    Chills    .  Prednisone Anxiety    Irritation  . Tegretol [Carbamazepine] Other (See Comments)    dizziness    Assessment/Plan:  1. Atrial fibrillation - Patient's potassium returned low at 3.6. Will have patient take 58mEq of potassium chloride now then 59mEq daily and procedure with admission as planned.  EKG reviewed and determined to be appropriate by Dr. Curt [name]. Based on Crcl 131 mL/min anticipate starting dose of Tikosyn 514mcg BID. Inpatient admitting notified of patient pending arrival.  Pt wife aware to pick up potassium now and take 2 tablets. She is aware to call if no call from admitting by 430pm.    Thank you, Curtis [name]. Patterson Hammersmith, PharmD, CPP, [place]  [id number] [address] [place], [place] [address]  Phone: [phone]; Fax: [phone] 02/19/2016 7:40 AM

## 2016-02-20 ENCOUNTER — Encounter (HOSPITAL_COMMUNITY): Payer: Self-pay | Admitting: *Deleted

## 2016-02-20 ENCOUNTER — Other Ambulatory Visit: Payer: Self-pay

## 2016-02-20 LAB — BASIC METABOLIC PANEL
ANION GAP: 7 (ref 5–15)
BUN: 8 mg/dL (ref 6–20)
CO2: 28 mmol/L (ref 22–32)
Calcium: 8.6 mg/dL — ABNORMAL LOW (ref 8.9–10.3)
Chloride: 105 mmol/L (ref 101–111)
Creatinine, Ser: 0.9 mg/dL (ref 0.61–1.24)
GFR calc Af Amer: >60 mL/min (ref >60)
GFR calc non Af Amer: >60 mL/min (ref >60)
GLUCOSE: 161 mg/dL — AB (ref 65–99)
POTASSIUM: 4.9 mmol/L (ref 3.5–5.1)
Sodium: 140 mmol/L (ref 135–145)

## 2016-02-20 LAB — MAGNESIUM: Magnesium: 2 mg/dL (ref 1.7–2.4)

## 2016-02-20 NOTE — Progress Notes (Signed)
Per insurance check for Tikosyn S/W ALLEN @ [employer]   1.TIKOSYN 500 MCG  BID  COVER- NON FORMULARY  ONE TIME FILL FOR 30 DAYS ONLY  CO-PAY- $ 85.00  TIER- 4 DRUG  PRIOR APPROVAL- YES [phone] AFTER ONE FILL   2 DOFETILIDE 500 MCG BID  COVER- YES  CO-PAY- $ 15.00  TIER- 2 DRUG  PRIOR APPROVAL- NO   PHARMACY : [facility]

## 2016-02-20 NOTE — Progress Notes (Signed)
Pharmacy Review for Dofetilide (Tikosyn) Initiation  Admit Complaint: 71 y.o. male admitted 02/19/2016 with atrial fibrillation to be initiated on dofetilide.   Assessment:  Patient Exclusion Criteria: If any screening criteria checked as "Yes", then  patient  should NOT receive dofetilide until criteria item is corrected. If "Yes" please indicate correction plan.  YES  NO Patient  Exclusion Criteria Correction Plan  [x]  []  Baseline QTc interval is greater than or equal to 440 msec. IF above YES box checked dofetilide contraindicated unless patient has ICD; then may proceed if QTc 500-550 msec or with known ventricular conduction abnormalities may proceed with QTc 550-600 msec. QTc =   Per pharmacist note: "QTc 445 ms - per [place] ok for admission."  []  [x]  Magnesium level is less than 1.8 mEq/l : Last magnesium:  Lab Results  Component Value Date   MG 2.0 02/20/2016         []  [x]  Potassium level is less than 4 mEq/l : Last potassium:  Lab Results  Component Value Date   K 4.9 02/20/2016         []  [x]  Patient is known or suspected to have a digoxin level greater than 2 ng/ml: No results found for: DIGOXIN    []  [x]  Creatinine clearance less than 20 ml/min (calculated using Cockcroft-Gault, actual body weight and serum creatinine): Estimated Creatinine Clearance: 100.1 mL/min (by C-G formula based on SCr of 0.9 mg/dL).    []  []  Patient has received drugs known to prolong the QT intervals within the last 48 hours (phenothiazines, tricyclics or tetracyclic antidepressants, erythromycin, H-1 antihistamines, cisapride, fluoroquinolones, azithromycin). Drugs not listed above may have an, as yet, undetected potential to prolong the QT interval, updated information on QT prolonging agents is available at this website:QT prolonging agents   []  [x]  Patient received a dose of hydrochlorothiazide (Oretic) alone or in any combination including triamterene (Dyazide, Maxzide) in the last 48  hours. Was on HCTZ PTA but last dose noted to be on 2/8  []  [x]  Patient received a medication known to increase dofetilide plasma concentrations prior to initial dofetilide dose:  . Trimethoprim (Primsol, Proloprim) in the last 36 hours . Verapamil (Calan, Verelan) in the last 36 hours or a sustained release dose in the last 72 hours . Megestrol (Megace) in the last 5 days  . Cimetidine (Tagamet) in the last 6 hours . Ketoconazole (Nizoral) in the last 24 hours . Itraconazole (Sporanox) in the last 48 hours  . Prochlorperazine (Compazine) in the last 36 hours    []  [x]  Patient is known to have a history of torsades de pointes; congenital or acquired long QT syndromes.   []  [x]  Patient has received a Class 1 antiarrhythmic with less than 2 half-lives since last dose. (Disopyramide, Quinidine, Procainamide, Lidocaine, Mexiletine, Flecainide, Propafenone)   []  [x]  Patient has received amiodarone therapy in the past 3 months or amiodarone level is greater than 0.3 ng/ml.    Patient has been appropriately anticoagulated with Apixaban  Ordering provider was confirmed at [email or web address] if they are not listed on the [facility] Prescribers list.  Goal of Therapy: Follow renal function, electrolytes, potential drug interactions, and dose adjustment. Provide education and 1 week supply at discharge.  Plan:  [x]   Physician selected initial dose within range recommended for patients level of renal function - will monitor for response.  []   Physician selected initial dose outside of range recommended for patients level of renal function - will discuss if the dose should  be altered at this time.   Select One Calculated CrCl  Dose q12h  [x]  > 60 ml/min 500 mcg  []  40-60 ml/min 250 mcg  []  20-40 ml/min 125 mcg   2. Follow up QTc after the first 5 doses, renal function, electrolytes (K & Mg) daily x 3     days, dose adjustment, success of initiation and facilitate 1 week discharge supply as      clinically indicated.  3. Initiate Tikosyn education video (Call [phone] and ask for Tikosyn Video # 116).  4. Place Enrollment Form on the chart for discharge supply of dofetilide.  **Noted electrolyte abnormalities this evening requiring correct - Tikosyn initiation will be postponed until these have been corrected. Follow-up labs have been ordered for the AM**  Thank you for allowing pharmacy to be a part of this patient's care.  Wynona Neat, PharmD, BCPS  02/20/2016 4:42 AM

## 2016-02-20 NOTE — Discharge Instructions (Addendum)
You have an appointment set up with the Atrial Fibrillation Clinic.  Multiple studies have shown that being followed by a dedicated atrial fibrillation clinic in addition to the standard care you receive from your other physicians improves health. We believe that enrollment in the atrial fibrillation clinic will allow us to better care for you.  ° °The phone number to the Atrial Fibrillation Clinic is [phone]. The clinic is staffed Monday through Friday from 8:30am to 5pm. ° °Parking Directions: The clinic is located in the Heart and Vascular Building connected to [facility]. °1)From [address] turn on to [address] and go to the 3rd entrance  (Heart and Vascular entrance) on the right. °2)Look to the right for Heart &Vascular Parking Garage. °3)A code for the entrance is required please call the clinic to receive this.   °4)Take the elevators to the 1st floor. Registration is in the room with the glass walls at the end of the hallway. ° °If you have any trouble parking or locating the clinic, please don’t hesitate to call [phone]. ° °Information on my medicine - ELIQUIS® (apixaban) ° °This medication education was reviewed with me or my healthcare representative as part of my discharge preparation.  The pharmacist that spoke with me during my hospital stay was:  Dang, Thuy Dien, RPH ° °Why was Eliquis® prescribed for you? °Eliquis® was prescribed for you to reduce the risk of a blood clot forming that can cause a stroke if you have a medical condition called atrial fibrillation (a type of irregular heartbeat). ° °What do You need to know about Eliquis® ? °Take your Eliquis® TWICE DAILY - one tablet in the morning and one tablet in the evening with or without food. If you have difficulty swallowing the tablet whole please discuss with your pharmacist how to take the medication safely. ° °Take Eliquis® exactly as prescribed by your doctor and DO NOT stop taking Eliquis® without talking to  the doctor who prescribed the medication.  Stopping may increase your risk of developing a stroke.  Refill your prescription before you run out. ° °After discharge, you should have regular check-up appointments with your healthcare provider that is prescribing your Eliquis®.  In the future your dose may need to be changed if your kidney function or weight changes by a significant amount or as you get older. ° °What do you do if you miss a dose? °If you miss a dose, take it as soon as you remember on the same day and resume taking twice daily.  Do not take more than one dose of ELIQUIS at the same time to make up a missed dose. ° °Important Safety Information °A possible side effect of Eliquis® is bleeding. You should call your healthcare provider right away if you experience any of the following: °? Bleeding from an injury or your nose that does not stop. °? Unusual colored urine (red or dark brown) or unusual colored stools (red or black). °? Unusual bruising for unknown reasons. °? A serious fall or if you hit your head (even if there is no bleeding). ° °Some medicines may interact with Eliquis® and might increase your risk of bleeding or clotting while on Eliquis®. To help avoid this, consult your healthcare provider or pharmacist prior to using any new prescription or non-prescription medications, including herbals, vitamins, non-steroidal anti-inflammatory drugs (NSAIDs) and supplements. ° °This website has more information on Eliquis® (apixaban): http://www.eliquis.com/eliquis/home ° °

## 2016-02-20 NOTE — Care Management Note (Signed)
Case Management Note Marvetta Gibbons RN, BSN Unit 2W-Case Manager [phone]  Patient Details  Name: Curtis [name] MRN: [id number] Date of Birth: 1945/01/10  Subjective/Objective:   Pt admitted with afib for Tikosyn load                 Action/Plan: PTA pt lived at home with wife- anticipate return home with wife-referral received for Tikosyn needs- insurance check completed- S[employer]W [employer] @ [employer]   1.TIKOSYN 500 MCG  BID  COVER- NON FORMULARY  ONE TIME FILL FOR 30 DAYS ONLY  CO-PAY- $ 85.00  TIER- 4 DRUG  PRIOR APPROVAL- YES [phone] AFTER ONE FILL   2 DOFETILIDE 500 MCG BID  COVER- YES  CO-PAY- $ 15.00  TIER- 2 DRUG  PRIOR APPROVAL- NO   PHARMACY : [facility]   Spoke with pt at bedside- coverage info discussed- per pt [facility] in [place]- [facility] for [facility] drug in [facility]- pt will also need 7 day supply from [employer] on discharge.   Expected Discharge Date:  02/22/16               Expected Discharge Plan:  Home/Self Care  In-House Referral:     Discharge planning Services  CM Consult, Medication Assistance  Post Acute Care Choice:  NA Choice offered to:  NA  DME Arranged:    DME Agency:     HH Arranged:    HH Agency:     Status of Service:  Completed, signed off  If discussed at [employer] of Stay Meetings, dates discussed:    Additional Comments:  Dawayne Patricia, RN 02/20/2016, 2:33 PM

## 2016-02-21 ENCOUNTER — Encounter: Payer: Self-pay | Admitting: Cardiovascular Disease

## 2016-02-21 ENCOUNTER — Other Ambulatory Visit: Payer: Self-pay

## 2016-02-21 ENCOUNTER — Encounter (HOSPITAL_COMMUNITY)
Admission: RE | Disposition: A | Discharge status: Home / Self Care | Payer: Self-pay | Source: Ambulatory Visit | Attending: Internal Medicine

## 2016-02-21 ENCOUNTER — Ambulatory Visit (HOSPITAL_COMMUNITY): Admission: RE | Admit: 2016-02-21 | Payer: PPO | Source: Ambulatory Visit | Admitting: Cardiology

## 2016-02-21 LAB — BASIC METABOLIC PANEL
Anion gap: 8 (ref 5–15)
BUN: 7 mg/dL (ref 6–20)
CHLORIDE: 105 mmol/L (ref 101–111)
CO2: 29 mmol/L (ref 22–32)
Calcium: 8.7 mg/dL — ABNORMAL LOW (ref 8.9–10.3)
Creatinine, Ser: 0.85 mg/dL (ref 0.61–1.24)
GFR calc non Af Amer: >60 mL/min (ref >60)
Glucose, Bld: 86 mg/dL (ref 65–99)
POTASSIUM: 4.3 mmol/L (ref 3.5–5.1)
SODIUM: 142 mmol/L (ref 135–145)

## 2016-02-21 LAB — MAGNESIUM: MAGNESIUM: 2 mg/dL (ref 1.7–2.4)

## 2016-02-21 SURGERY — CARDIOVERSION
Anesthesia: Monitor Anesthesia Care

## 2016-02-21 NOTE — Progress Notes (Signed)
SUBJECTIVE: The patient is doing well today.  At this time, he denies chest pain, shortness of breath, or any new concerns. Converted to sinus rhythm overnight after second dose of Tikosyn.   Marland Kitchen apixaban  5 mg Oral BID  . diltiazem  240 mg Oral Daily  . dofetilide  500 mcg Oral BID  . fluticasone  2 spray Each Nare Daily  . pantoprazole  40 mg Oral Daily  . potassium chloride SA  20 mEq Oral Daily  . predniSONE  10 mg Oral Q breakfast  . sodium chloride flush  3 mL Intravenous Q12H     OBJECTIVE: Physical Exam: Vitals:   02/20/16 0830 02/20/16 1239 02/20/16 2117 02/21/16 0436  BP: 121/76 126/80 126/81 136/79  Pulse: 85 71 60 73  Resp:  18 18 20   Temp: 98.1 F (36.7 C) 98 F (36.7 C) 98.2 F (36.8 C) 97.8 F (36.6 C)  TempSrc: Oral Oral Oral Oral  SpO2: 97% 97% 97% 96%  Weight:      Height:        Intake/Output Summary (Last 24 hours) at 02/21/16 1332 Last data filed at 02/20/16 1800  Gross per 24 hour  Intake              360 ml  Output                0 ml  Net              360 ml    Telemetry reveals sinus rhythm  GEN- The patient is well appearing, alert and oriented x 3 today.   Head- normocephalic, atraumatic Eyes-  Sclera clear, conjunctiva pink Ears- hearing intact Oropharynx- clear Neck- supple, no JVP Lymph- no cervical lymphadenopathy Lungs- Clear to ausculation bilaterally, normal work of breathing Heart- Regular rate and rhythm, no murmurs, rubs or gallops, PMI not laterally displaced GI- soft, NT, ND, + BS Extremities- no clubbing, cyanosis, or edema Skin- no rash or lesion Psych- euthymic mood, full affect Neuro- strength and sensation are intact  LABS: Basic Metabolic Panel:  Recent Labs  02/20/16 0330 02/21/16 0425  NA 140 142  K 4.9 4.3  CL 105 105  CO2 28 29  GLUCOSE 161* 86  BUN 8 7  CREATININE 0.90 0.85  CALCIUM 8.6* 8.7*  MG 2.0 2.0   Liver Function Tests: No results for input(s): AST, ALT, ALKPHOS, BILITOT, PROT,  ALBUMIN in the last 72 hours. No results for input(s): LIPASE, AMYLASE in the last 72 hours. CBC: No results for input(s): WBC, NEUTROABS, HGB, HCT, MCV, PLT in the last 72 hours. Cardiac Enzymes: No results for input(s): CKTOTAL, CKMB, CKMBINDEX, TROPONINI in the last 72 hours. BNP: Invalid input(s): POCBNP D-Dimer: No results for input(s): DDIMER in the last 72 hours. Hemoglobin A1C: No results for input(s): HGBA1C in the last 72 hours. Fasting Lipid Panel: No results for input(s): CHOL, HDL, LDLCALC, TRIG, CHOLHDL, LDLDIRECT in the last 72 hours. Thyroid Function Tests: No results for input(s): TSH, T4TOTAL, T3FREE, THYROIDAB in the last 72 hours.  Invalid input(s): FREET3 Anemia Panel: No results for input(s): VITAMINB12, FOLATE, FERRITIN, TIBC, IRON, RETICCTPCT in the last 72 hours.  RADIOLOGY: No results found.  ASSESSMENT AND PLAN:  Active Problems:   Atrial fibrillation, persistent ([place]) 1. Persistent a fib, in sinus rhythm now after second dose of tikosyn --QTc stable, continue tikosyun 500 mcg --Anticoagulation for CHA2DS2VASc 2 on Eliquis has not missed any doses.  2.  HTN controlled.  Ericson Nafziger Meredith Leeds, MD 02/21/2016 1:32 PM

## 2016-02-21 NOTE — Telephone Encounter (Signed)
This encounter was created in error - please disregard.

## 2016-02-21 NOTE — Telephone Encounter (Signed)
[name] have you received anything back from paperwork for the [facility] PA that was initiated by [place] on 02/09/16?  thanks

## 2016-02-21 NOTE — Telephone Encounter (Signed)
New message    Pt states that she received a phone call about her medications and what she should not take and wasn't sure who called - no message

## 2016-02-22 ENCOUNTER — Other Ambulatory Visit: Payer: Self-pay

## 2016-02-22 MED ORDER — DOFETILIDE 500 MCG PO CAPS: 500.0000 ug | ORAL_CAPSULE | Freq: Two times a day (BID) | ORAL | 3 refills | Status: DC

## 2016-02-22 NOTE — Progress Notes (Addendum)
Pt/family given discharge instructions, medication lists, follow up appointments, and when to call the doctor.  Pt/family verbalizes understanding. Explained tikosyn schedule and how to back time down to preferred schedule.  Patient and wife verbalized understanding.  Transported to main entrance for discharge. Payton Emerald, RN  EKG prior to discharge with QTc 468

## 2016-02-22 NOTE — Progress Notes (Signed)
EKG post am dose of tikosyn shows 49ms QTc.  Per [facility] patient will need EKG prior to discharge (around 6 pm) to check for discharge with wife. Pt resting with call bell within reach.  Will continue to monitor. Payton Emerald, RN

## 2016-02-22 NOTE — Discharge Summary (Signed)
ELECTROPHYSIOLOGY PROCEDURE DISCHARGE SUMMARY    Patient ID: Curtis [name],  MRN: [id number], DOB/AGE: Sep 23, 1945 71 y.o.  Admit date: 02/19/2016 Discharge date: 02/22/2016  Primary Care Physician: Sallee Lange, MD Primary Cardiologist: Johnsie Cancel Electrophysiologist: [facility]  Primary Discharge Diagnosis:  1.  Persistent atrial fibrillation status post Tikosyn loading this admission  Secondary Discharge Diagnosis:  1.  Hyperlipidemia 2.  Hypertension 3.  GERD 4.  Arthritis  Allergies  Allergen Reactions  . Oxycodone Itching  . Pravastatin Other (See Comments)    Chills    . Simvastatin Other (See Comments)    Chills    . Prednisone Anxiety    Irritation  . Tegretol [Carbamazepine] Other (See Comments)    dizziness     Procedures This Admission:  1.  Tikosyn loading  Brief HPI: Curtis [name] is a 71 y.o. male with a past medical history as noted above.  They were referred to the AF clinic in the outpatient setting for treatment options of atrial fibrillation.  Risks, benefits, and alternatives to Tikosyn were reviewed with the patient who wished to proceed.    Hospital Course:  The patient was admitted and Tikosyn was initiated.  Renal function and electrolytes were followed during the hospitalization.  Their QTc remained stable.  They were monitored until discharge on telemetry which demonstrated sinus rhythm.  On the day of discharge, they were examined by Dr Curt Bears who considered them stable for discharge to home.  Follow-up has been arranged with AF clinic in 1 week and with Dr Curt Bears in 4 weeks.   Physical Exam: Vitals:   02/21/16 0436 02/21/16 1337 02/21/16 2020 02/22/16 0632  BP: 136/79 117/62 131/74 124/83  Pulse: 73 64 71 71  Resp: 20 18    Temp: 97.8 F (36.6 C) 98.9 F (37.2 C) 97.9 F (36.6 C) 98.2 F (36.8 C)  TempSrc: Oral Oral Oral Oral  SpO2: 96% 98% 97% 94%  Weight:      Height:        GEN- The patient is well appearing,  alert and oriented x 3 today.   HEENT: normocephalic, atraumatic; sclera clear, conjunctiva pink; hearing intact; oropharynx clear; neck supple  Lungs- Clear to ausculation bilaterally, normal work of breathing.  No wheezes, rales, rhonchi Heart- Regular rate and rhythm, no murmurs, rubs or gallops  GI- soft, non-tender, non-distended, bowel sounds present  Extremities- no clubbing, cyanosis, or edema  MS- no significant deformity or atrophy Skin- warm and dry, no rash or lesion Psych- euthymic mood, full affect Neuro- strength and sensation are intact   Labs:   Lab Results  Component Value Date   WBC 11.0 (H) 12/12/2015   HGB 16.1 07/21/2015   HCT 46.8 12/12/2015   MCV 95 12/12/2015   PLT 128 (L) 12/12/2015     Recent Labs Lab 02/21/16 0425  NA 142  K 4.3  CL 105  CO2 29  BUN 7  CREATININE 0.85  CALCIUM 8.7*  GLUCOSE 86     Discharge Medications:  Allergies as of 02/22/2016      Reactions   Oxycodone Itching   Pravastatin Other (See Comments)   Chills    Simvastatin Other (See Comments)   Chills    Prednisone Anxiety   Irritation   Tegretol [carbamazepine] Other (See Comments)   dizziness      Medication List    TAKE these medications   albuterol 108 (90 Base) MCG/ACT inhaler Commonly known as:  PROVENTIL HFA;VENTOLIN HFA Inhale 2 puffs  into the lungs every 6 (six) hours as needed for wheezing.   apixaban 5 MG Tabs tablet Commonly known as:  ELIQUIS Take 1 tablet (5 mg total) by mouth 2 (two) times daily.   diltiazem 240 MG 24 hr capsule Commonly known as:  CARDIZEM CD Take 1 capsule (240 mg total) by mouth daily.   dofetilide 500 MCG capsule Commonly known as:  TIKOSYN Take 1 capsule (500 mcg total) by mouth 2 (two) times daily.   DUCODYL 5 MG EC tablet Generic drug:  bisacodyl Take 5 mg by mouth daily as needed for moderate constipation.   fluticasone 50 MCG/ACT nasal spray Commonly known as:  FLONASE Place 2 sprays into both nostrils  daily.   HYDROcodone-acetaminophen 10-325 MG tablet Commonly known as:  NORCO Take 1 tablet by mouth 3 (three) times daily as needed.   omeprazole 20 MG capsule Commonly known as:  PRILOSEC Take 20 mg by mouth daily as needed. Reported on 07/21/2015   potassium chloride SA 20 MEQ tablet Commonly known as:  K-DUR,KLOR-CON Take 1 tablet (20 mEq total) by mouth daily. Take 2 tablets today then take 1 tablet daily.   predniSONE 10 MG tablet Commonly known as:  DELTASONE Take 10 mg by mouth daily with breakfast.   SYSTANE 0.4-0.3 % Gel ophthalmic gel Generic drug:  Polyethyl Glycol-Propyl Glycol Place 1 application into both eyes daily as needed.       Disposition:  Discharge Instructions    Diet - low sodium heart healthy    Complete by:  As directed    Increase activity slowly    Complete by:  As directed      Follow-up Information    [facility] Follow up on 02/29/2016.   Specialty:  Cardiology Why:  at [facility] information: [id number] [place] [phone]       Ludell Zacarias Meredith Leeds, MD Follow up on 04/03/2016.   Specialty:  Cardiology Why:  at 9:45AM  Contact information: [place] [address] [phone]           Duration of Discharge Encounter: Greater than 30 minutes including physician time.  Signed, Chanetta Marshall, NP 02/22/2016 8:10 AM   I have seen and examined this patient with Chanetta Marshall.  Agree with above, note added to reflect my findings.  On exam, RRR, no murmurs, lungs clear. Admitted to the hospital for Tikosyn loading with atrial fibrillation. Converted to sinus rhythm after the second dose of Tikosyn. Had one episode of non-sustained VT, but on review of his 48 hour monitor, has had nonsustained VT prior to Tikosyn unloading. He Hether Anselmo get a dose of 500 g today with EKG afterwards. Should his QTC remained stable, [place] plan for discharge  later tonight.  Quaneisha Hanisch M. Markela Wee MD 02/22/2016 8:44 AM

## 2016-02-22 NOTE — Telephone Encounter (Signed)
A PA was never initiated on this pt. Will need to call [facility] at [phone]. I am in clinic at this time and am unbale to call. Will call later.

## 2016-02-23 ENCOUNTER — Encounter (HOSPITAL_COMMUNITY): Payer: Medicare Other

## 2016-02-23 ENCOUNTER — Telehealth: Payer: Self-pay | Admitting: Internal Medicine

## 2016-02-23 NOTE — Telephone Encounter (Signed)
Called [phone] and initiated the PA for the esbriet 267 mg  3 tabs TID.  This has been submitted and will be reviewed by the clinical team and they will fax over response in 2-5 days.  Will forward to elsie to follow up on.

## 2016-02-26 NOTE — Telephone Encounter (Signed)
Please see phone note from 2.16.18. Will sign off.

## 2016-02-29 ENCOUNTER — Ambulatory Visit (HOSPITAL_COMMUNITY)
Admit: 2016-02-29 | Discharge: 2016-02-29 | Disposition: A | Discharge status: Home / Self Care | Payer: PPO | Attending: Nurse Practitioner | Admitting: Nurse Practitioner

## 2016-02-29 ENCOUNTER — Telehealth (HOSPITAL_COMMUNITY): Payer: Self-pay | Admitting: *Deleted

## 2016-02-29 ENCOUNTER — Encounter (HOSPITAL_COMMUNITY): Payer: Self-pay | Admitting: Nurse Practitioner

## 2016-02-29 VITALS — BP 126/70 | HR 66 | Ht 73.0 in | Wt 267.6 lb

## 2016-02-29 DIAGNOSIS — Z9889 Other specified postprocedural states: Secondary | ICD-10-CM | POA: Insufficient documentation

## 2016-02-29 DIAGNOSIS — Z87891 Personal history of nicotine dependence: Secondary | ICD-10-CM | POA: Diagnosis not present

## 2016-02-29 DIAGNOSIS — Z888 Allergy status to other drugs, medicaments and biological substances status: Secondary | ICD-10-CM | POA: Insufficient documentation

## 2016-02-29 DIAGNOSIS — J84112 Idiopathic pulmonary fibrosis: Secondary | ICD-10-CM | POA: Diagnosis not present

## 2016-02-29 DIAGNOSIS — Z79899 Other long term (current) drug therapy: Secondary | ICD-10-CM | POA: Insufficient documentation

## 2016-02-29 DIAGNOSIS — J45909 Unspecified asthma, uncomplicated: Secondary | ICD-10-CM | POA: Insufficient documentation

## 2016-02-29 DIAGNOSIS — J449 Chronic obstructive pulmonary disease, unspecified: Secondary | ICD-10-CM | POA: Diagnosis not present

## 2016-02-29 DIAGNOSIS — E785 Hyperlipidemia, unspecified: Secondary | ICD-10-CM | POA: Insufficient documentation

## 2016-02-29 DIAGNOSIS — Z9049 Acquired absence of other specified parts of digestive tract: Secondary | ICD-10-CM | POA: Insufficient documentation

## 2016-02-29 DIAGNOSIS — Z825 Family history of asthma and other chronic lower respiratory diseases: Secondary | ICD-10-CM | POA: Diagnosis not present

## 2016-02-29 DIAGNOSIS — K219 Gastro-esophageal reflux disease without esophagitis: Secondary | ICD-10-CM | POA: Diagnosis not present

## 2016-02-29 DIAGNOSIS — Z7901 Long term (current) use of anticoagulants: Secondary | ICD-10-CM | POA: Insufficient documentation

## 2016-02-29 DIAGNOSIS — H409 Unspecified glaucoma: Secondary | ICD-10-CM | POA: Diagnosis not present

## 2016-02-29 DIAGNOSIS — Z823 Family history of stroke: Secondary | ICD-10-CM | POA: Diagnosis not present

## 2016-02-29 DIAGNOSIS — Z803 Family history of malignant neoplasm of breast: Secondary | ICD-10-CM | POA: Diagnosis not present

## 2016-02-29 DIAGNOSIS — I4819 Other persistent atrial fibrillation: Secondary | ICD-10-CM

## 2016-02-29 DIAGNOSIS — Z8249 Family history of ischemic heart disease and other diseases of the circulatory system: Secondary | ICD-10-CM | POA: Insufficient documentation

## 2016-02-29 DIAGNOSIS — I481 Persistent atrial fibrillation: Secondary | ICD-10-CM | POA: Insufficient documentation

## 2016-02-29 DIAGNOSIS — I10 Essential (primary) hypertension: Secondary | ICD-10-CM | POA: Insufficient documentation

## 2016-02-29 LAB — BASIC METABOLIC PANEL
Anion gap: 7 (ref 5–15)
BUN: 9 mg/dL (ref 6–20)
CHLORIDE: 106 mmol/L (ref 101–111)
CO2: 26 mmol/L (ref 22–32)
CREATININE: 0.84 mg/dL (ref 0.61–1.24)
Calcium: 9 mg/dL (ref 8.9–10.3)
Glucose, Bld: 112 mg/dL — ABNORMAL HIGH (ref 65–99)
POTASSIUM: 4.2 mmol/L (ref 3.5–5.1)
SODIUM: 139 mmol/L (ref 135–145)

## 2016-02-29 LAB — MAGNESIUM: MAGNESIUM: 1.8 mg/dL (ref 1.7–2.4)

## 2016-02-29 MED ORDER — FUROSEMIDE 20 MG PO TABS: 20.0000 mg | ORAL_TABLET | Freq: Every day | ORAL | 2 refills | Status: DC

## 2016-02-29 NOTE — Telephone Encounter (Signed)
Pt cld to inquire if he could return to work.  I contacted pt and informed him that per Curtis Palau, NP he could go back to work.  The patient understood

## 2016-02-29 NOTE — Telephone Encounter (Signed)
Called to check the status of PA for Esbriet- [phone]  Was on hold for approx 15 min before leaving msg  Will await call back

## 2016-02-29 NOTE — Progress Notes (Signed)
Primary Care Physician: Sallee Lange, MD Referring Physician: Dr. Jerolyn Center Curtis [name] is a 71 y.o. male with a h/o of persistent afib in the atrial fibrillation clinic for evaluation.  H/o new onset since July of  by ECG at Dr Lance Sell office. History of COPD and chronic interstitial lung dx with dyspnea and fatigue Previously seen by cardiology 2013 and had normal myoview study with EF 61%.  Done for atypical chest pain. History of HTN and elevated lipids but Intolerant to statins. Is followed at [facility] for UIP and instertial lung disease .  He has a successful  cardioversion 12/5 which held pt in rhythm x 2 weeks. He saw a significant improvement in SR. He is on apixaban 5 mg bid and takes without fail.On initial visit, various antiarrythmic's were discussed with wife and pt and they wanted to look into costs. He was also on prednisone and levaquin at the time for UTI and wanted to finish. He checked prices on drugs and reviewed options again. He does want to try flecainide but will need a stress test since last test was 5 years ok and if ok will start drug right away. Stress test showed perfusion defect and Dt. Allred recommended avoiding flecainide for possibility of CAD. He was admitted to [facility] for tikosyn .  F/u's 2/22 after successful tikosyn load. He did convert without cardioversion. Qtc stayed stable. HCTZ was stopped and  he has picked up 7 lbs and has LLE.  Today, he denies symptoms of palpitations,  orthopnea, PND, lower extremity edema, dizziness, presyncope, syncope, or neurologic sequela.  Positive for shortness of breath and fatigue, palpitations.The patient is tolerating medications without difficulties and is otherwise without complaint today.   Past Medical History:  Diagnosis Date  . Arthritis   . Asthma   . Cervical spondylosis   . Chest pain    Emergency room October 24, 2011, no MI  //   Nuclear, October, 2013, adenosine, EF 61%, no scar or ischemia  .  Dyslipidemia    Statin intolerant  . GERD (gastroesophageal reflux disease)   . Glaucoma   . Hearing loss   . Hypertension   . Idiopathic pulmonary fibrosis ([place])   . Interstitial pulmonary fibrosis ([place]) 2012  . Left shoulder pain 2011  . Psoriasis   . Statin intolerance    Past Surgical History:  Procedure Laterality Date  . BRAIN SURGERY    . CARDIOVERSION N/A 12/13/2015   Procedure: CARDIOVERSION;  Surgeon: Josue Hector, MD;  Location: [place];  Service: Cardiovascular;  Laterality: N/A;  . CATARACT EXTRACTION W/PHACO Right 06/11/2012   Procedure: CATARACT EXTRACTION PHACO AND INTRAOCULAR LENS PLACEMENT ([place]);  Surgeon: Tonny Branch, MD;  Location: [facility];  Service: Ophthalmology;  Laterality: Right;  CDE:  14.43  . CATARACT EXTRACTION W/PHACO Left 06/29/2012   Procedure: CATARACT EXTRACTION PHACO AND INTRAOCULAR LENS PLACEMENT (IOC);  Surgeon: Tonny Branch, MD;  Location: [facility];  Service: Ophthalmology;  Laterality: Left;  CDE:14.11  . CHOLECYSTECTOMY  05/08/2012  . CHOLECYSTECTOMY N/A 05/08/2012   Procedure: LAPAROSCOPIC CHOLECYSTECTOMY;  Surgeon: Curtis Bowie, MD;  Location: [place];  Service: General;  Laterality: N/A;  . COLONOSCOPY    . LIVER BIOPSY N/A 05/08/2012   Procedure: LIVER BIOPSY;  Surgeon: Curtis Bowie, MD;  Location: [place];  Service: General;  Laterality: N/A;  . NECK SURGERY     cervical disc with cliip    Current Outpatient Prescriptions  Medication Sig Dispense Refill  .  albuterol (PROVENTIL HFA;VENTOLIN HFA) 108 (90 Base) MCG/ACT inhaler Inhale 2 puffs into the lungs every 6 (six) hours as needed for wheezing. 1 Inhaler 2  . apixaban (ELIQUIS) 5 MG TABS tablet Take 1 tablet (5 mg total) by mouth 2 (two) times daily. 60 tablet 3  . bisacodyl (DUCODYL) 5 MG EC tablet Take 5 mg by mouth daily as needed for moderate constipation.    Marland Kitchen diltiazem (CARDIZEM CD) 240 MG 24 hr capsule Take 1 capsule (240 mg total) by mouth daily. 90 capsule 3  . dofetilide  (TIKOSYN) 500 MCG capsule Take 1 capsule (500 mcg total) by mouth 2 (two) times daily. 60 capsule 3  . fluticasone (FLONASE) 50 MCG/ACT nasal spray Place 2 sprays into both nostrils daily. 16 g 5  . HYDROcodone-acetaminophen (NORCO) 10-325 MG tablet Take 1 tablet by mouth 3 (three) times daily as needed. 90 tablet 0  . omeprazole (PRILOSEC) 20 MG capsule Take 20 mg by mouth daily as needed. Reported on 07/21/2015    . Polyethyl Glycol-Propyl Glycol (SYSTANE) 0.4-0.3 % GEL ophthalmic gel Place 1 application into both eyes daily as needed.    . potassium chloride SA (K-DUR,KLOR-CON) 20 MEQ tablet Take 1 tablet (20 mEq total) by mouth daily. Take 2 tablets today then take 1 tablet daily. (Patient taking differently: Take 20 mEq by mouth daily. ) 31 tablet 0  . predniSONE (DELTASONE) 10 MG tablet Take 10 mg by mouth daily with breakfast.   11  . furosemide (LASIX) 20 MG tablet Take 1 tablet (20 mg total) by mouth daily. 30 tablet 2   No current facility-administered medications for this encounter.     Allergies  Allergen Reactions  . Oxycodone Itching  . Pravastatin Other (See Comments)    Chills    . Simvastatin Other (See Comments)    Chills    . Prednisone Anxiety    Irritation  . Tegretol [Carbamazepine] Other (See Comments)    dizziness    Social History   Social History  . Marital status: Married    Spouse name: N/A  . Number of children: N/A  . Years of education: N/A   Occupational History  . Not on file.   Social History Main Topics  . Smoking status: Former Smoker    Packs/day: 0.50    Years: 52.00    Types: Cigarettes    Quit date: 09/08/2003  . Smokeless tobacco: Never Used  . Alcohol use No     Comment: quit drinking 2004  . Drug use: No  . Sexual activity: Yes    Birth control/ protection: None   Other Topics Concern  . Not on file   Social History Narrative  . No narrative on file    Family History  Problem Relation Age of Onset  . Hypertension  Mother   . Congestive Heart Failure Mother   . COPD Mother   . Stroke Father   . Cancer Sister     breast    ROS- All systems are reviewed and negative except as per the HPI above  Physical Exam: Vitals:   02/29/16 0953  BP: 126/70  Pulse: 66  Weight: 267 lb 9.6 oz (121.4 kg)  Height: 6\' 1"  (1.854 m)   Wt Readings from Last 3 Encounters:  02/29/16 267 lb 9.6 oz (121.4 kg)  02/19/16 261 lb 6.4 oz (118.6 kg)  02/19/16 265 lb 12 oz (120.5 kg)    Labs: Lab Results  Component Value Date   NA  139 02/29/2016   K 4.2 02/29/2016   CL 106 02/29/2016   CO2 26 02/29/2016   GLUCOSE 112 (H) 02/29/2016   BUN 9 02/29/2016   CREATININE 0.84 02/29/2016   CALCIUM 9.0 02/29/2016   MG 1.8 02/29/2016   Lab Results  Component Value Date   INR 1.1 12/12/2015   Lab Results  Component Value Date   CHOL 145 06/13/2015   HDL 33 (L) 06/13/2015   LDLCALC 92 06/13/2015   TRIG 101 06/13/2015     GEN- The patient is well appearing, alert and oriented x 3 today.   Head- normocephalic, atraumatic Eyes-  Sclera clear, conjunctiva pink Ears- hearing intact Oropharynx- clear Neck- supple, no JVP Lymph- no cervical lymphadenopathy Lungs- Clear to ausculation bilaterally, normal work of breathing Heart- regular rate and rhythm, no murmurs, rubs or gallops, PMI not laterally displaced GI- soft, NT, ND, + BS Extremities- no clubbing, cyanosis, or  Trace to 1+ pitting edema MS- no significant deformity or atrophy Skin- no rash or lesion Psych- euthymic mood, full affect Neuro- strength and sensation are intact  EKG-NSR at 66 bpm, pr int 122 ms, qrs int 82 ms, qtc 457 ms(stable) Epic records reviewed    Assessment and Plan: 1. Persistent symptomatic afib Pt definitely feels improved in SR since starting Tikosyn He has gained fluid weight since stopping HCTZ Will start 20 mg lasix daily Bmet/mag today General precautions re tikosyn given He will continue on apixaban, states no missed  doses   F/u in one week with repeat bmet/mag since staring lasix and repeat EKG  Butch Penny C. Colette Dicamillo, [facility] [address] [place], [place] [address] [phone]

## 2016-02-29 NOTE — Patient Instructions (Signed)
Your physician has recommended you make the following change in your medication:  1)Lasix 20mg  once a day

## 2016-03-01 ENCOUNTER — Other Ambulatory Visit (HOSPITAL_COMMUNITY): Payer: Self-pay | Admitting: *Deleted

## 2016-03-01 MED ORDER — MAGNESIUM 200 MG PO TABS: 200.0000 mg | ORAL_TABLET | Freq: Every day | ORAL | Status: DC

## 2016-03-05 NOTE — Telephone Encounter (Signed)
Called [employer] again to check status of PA for Es[place] at [phone]- per rep a PA was started but when pt received his bridge supply, someone closed out the PA and it was never completed.  This has been resubmitted as an urgent request, and a response is expected within 72 hours. Will route back to [place] for follow up.

## 2016-03-06 DIAGNOSIS — J8410 Pulmonary fibrosis, unspecified: Secondary | ICD-10-CM | POA: Diagnosis not present

## 2016-03-07 ENCOUNTER — Encounter (HOSPITAL_COMMUNITY): Payer: Self-pay | Admitting: Nurse Practitioner

## 2016-03-07 ENCOUNTER — Ambulatory Visit (HOSPITAL_COMMUNITY)
Admission: RE | Admit: 2016-03-07 | Discharge: 2016-03-07 | Disposition: A | Discharge status: Home / Self Care | Payer: PPO | Source: Ambulatory Visit | Attending: Nurse Practitioner | Admitting: Nurse Practitioner

## 2016-03-07 VITALS — BP 136/78 | HR 77 | Ht 73.0 in | Wt 264.4 lb

## 2016-03-07 DIAGNOSIS — I4891 Unspecified atrial fibrillation: Secondary | ICD-10-CM | POA: Diagnosis not present

## 2016-03-07 DIAGNOSIS — I481 Persistent atrial fibrillation: Secondary | ICD-10-CM | POA: Diagnosis not present

## 2016-03-07 DIAGNOSIS — I4581 Long QT syndrome: Secondary | ICD-10-CM | POA: Insufficient documentation

## 2016-03-07 DIAGNOSIS — Z888 Allergy status to other drugs, medicaments and biological substances status: Secondary | ICD-10-CM | POA: Diagnosis not present

## 2016-03-07 DIAGNOSIS — Z9889 Other specified postprocedural states: Secondary | ICD-10-CM | POA: Diagnosis not present

## 2016-03-07 DIAGNOSIS — Z79899 Other long term (current) drug therapy: Secondary | ICD-10-CM | POA: Insufficient documentation

## 2016-03-07 DIAGNOSIS — Z825 Family history of asthma and other chronic lower respiratory diseases: Secondary | ICD-10-CM | POA: Diagnosis not present

## 2016-03-07 DIAGNOSIS — I4819 Other persistent atrial fibrillation: Secondary | ICD-10-CM

## 2016-03-07 DIAGNOSIS — E785 Hyperlipidemia, unspecified: Secondary | ICD-10-CM | POA: Diagnosis not present

## 2016-03-07 DIAGNOSIS — J45909 Unspecified asthma, uncomplicated: Secondary | ICD-10-CM | POA: Insufficient documentation

## 2016-03-07 DIAGNOSIS — K219 Gastro-esophageal reflux disease without esophagitis: Secondary | ICD-10-CM | POA: Insufficient documentation

## 2016-03-07 DIAGNOSIS — I10 Essential (primary) hypertension: Secondary | ICD-10-CM | POA: Insufficient documentation

## 2016-03-07 DIAGNOSIS — Z87891 Personal history of nicotine dependence: Secondary | ICD-10-CM | POA: Insufficient documentation

## 2016-03-07 DIAGNOSIS — Z9049 Acquired absence of other specified parts of digestive tract: Secondary | ICD-10-CM | POA: Insufficient documentation

## 2016-03-07 DIAGNOSIS — Z8249 Family history of ischemic heart disease and other diseases of the circulatory system: Secondary | ICD-10-CM | POA: Diagnosis not present

## 2016-03-07 DIAGNOSIS — I491 Atrial premature depolarization: Secondary | ICD-10-CM | POA: Insufficient documentation

## 2016-03-07 DIAGNOSIS — H409 Unspecified glaucoma: Secondary | ICD-10-CM | POA: Insufficient documentation

## 2016-03-07 DIAGNOSIS — Z823 Family history of stroke: Secondary | ICD-10-CM | POA: Insufficient documentation

## 2016-03-07 DIAGNOSIS — J84112 Idiopathic pulmonary fibrosis: Secondary | ICD-10-CM | POA: Diagnosis not present

## 2016-03-07 DIAGNOSIS — Z803 Family history of malignant neoplasm of breast: Secondary | ICD-10-CM | POA: Diagnosis not present

## 2016-03-07 LAB — BASIC METABOLIC PANEL
Anion gap: 10 (ref 5–15)
BUN: 7 mg/dL (ref 6–20)
CO2: 28 mmol/L (ref 22–32)
CREATININE: 0.96 mg/dL (ref 0.61–1.24)
Calcium: 9.1 mg/dL (ref 8.9–10.3)
Chloride: 100 mmol/L — ABNORMAL LOW (ref 101–111)
GFR calc Af Amer: >60 mL/min (ref >60)
Glucose, Bld: 199 mg/dL — ABNORMAL HIGH (ref 65–99)
Potassium: 3.7 mmol/L (ref 3.5–5.1)
SODIUM: 138 mmol/L (ref 135–145)

## 2016-03-07 LAB — MAGNESIUM: MAGNESIUM: 1.9 mg/dL (ref 1.7–2.4)

## 2016-03-07 NOTE — Progress Notes (Signed)
Primary Care Physician: Sallee Lange, MD Referring Physician: Dr. Jerolyn Center Kimber is a 71 y.o. male with a h/o of persistent afib in the atrial fibrillation clinic for evaluation.  H/o new onset since July of  by ECG at Dr Lance Sell office. History of COPD and chronic interstitial lung dx with dyspnea and fatigue Previously seen by cardiology 2013 and had normal myoview study with EF 61%.  Done for atypical chest pain. History of HTN and elevated lipids but Intolerant to statins. Is followed at [facility] for UIP and instertial lung disease .  He has a successful  cardioversion 12/5 which held pt in rhythm x 2 weeks. He saw a significant improvement in SR. He is on apixaban 5 mg bid and takes without fail.On initial visit, various antiarrythmic's were discussed with wife and pt and they wanted to look into costs. He was also on prednisone and levaquin at the time for UTI and wanted to finish. He checked prices on drugs and reviewed options again. He does want to try flecainide but will need a stress test since last test was 5 years ok and if ok will start drug right away. Stress test showed perfusion defect and Dt. Allred recommended avoiding flecainide for possibility of CAD. He was admitted to [facility] for tikosyn .  F/u's 2/22 after successful tikosyn load. He did convert without cardioversion. Qtc stayed stable. HCTZ was stopped and  he has picked up 7 lbs and has LLE.   He was started on lasix 20 mg a day and was asked to f/u in one week with bmet/amg and EKG. He was not losing weight fast enough so he doubled lasix to 40 mg a day. His qtc has prolonged and repeat  bmet/mag show a k+/ mag of 3.7/1.9 .otherwise is in SR.   Today, he denies symptoms of palpitations,  orthopnea, PND, lower extremity edema, dizziness, presyncope, syncope, or neurologic sequela.  Positive for shortness of breath and fatigue, palpitations.The patient is tolerating medications without difficulties and is otherwise  without complaint today.   Past Medical History:  Diagnosis Date  . Arthritis   . Asthma   . Cervical spondylosis   . Chest pain    Emergency room October 24, 2011, no MI  //   Nuclear, October, 2013, adenosine, EF 61%, no scar or ischemia  . Dyslipidemia    Statin intolerant  . GERD (gastroesophageal reflux disease)   . Glaucoma   . Hearing loss   . Hypertension   . Idiopathic pulmonary fibrosis ([place])   . Interstitial pulmonary fibrosis ([place]) 2012  . Left shoulder pain 2011  . Psoriasis   . Statin intolerance    Past Surgical History:  Procedure Laterality Date  . BRAIN SURGERY    . CARDIOVERSION N/A 12/13/2015   Procedure: CARDIOVERSION;  Surgeon: Josue Hector, MD;  Location: [place];  Service: Cardiovascular;  Laterality: N/A;  . CATARACT EXTRACTION W/PHACO Right 06/11/2012   Procedure: CATARACT EXTRACTION PHACO AND INTRAOCULAR LENS PLACEMENT ([place]);  Surgeon: Tonny Branch, MD;  Location: [facility];  Service: Ophthalmology;  Laterality: Right;  CDE:  14.43  . CATARACT EXTRACTION W/PHACO Left 06/29/2012   Procedure: CATARACT EXTRACTION PHACO AND INTRAOCULAR LENS PLACEMENT (IOC);  Surgeon: Tonny Branch, MD;  Location: [facility];  Service: Ophthalmology;  Laterality: Left;  CDE:14.11  . CHOLECYSTECTOMY  05/08/2012  . CHOLECYSTECTOMY N/A 05/08/2012   Procedure: LAPAROSCOPIC CHOLECYSTECTOMY;  Surgeon: Harl Bowie, MD;  Location: [place];  Service: General;  Laterality: N/A;  . COLONOSCOPY    . LIVER BIOPSY N/A 05/08/2012   Procedure: LIVER BIOPSY;  Surgeon: Harl Bowie, MD;  Location: [place];  Service: General;  Laterality: N/A;  . NECK SURGERY     cervical disc with cliip    Current Outpatient Prescriptions  Medication Sig Dispense Refill  . albuterol (PROVENTIL HFA;VENTOLIN HFA) 108 (90 Base) MCG/ACT inhaler Inhale 2 puffs into the lungs every 6 (six) hours as needed for wheezing. 1 Inhaler 2  . apixaban (ELIQUIS) 5 MG TABS tablet Take 1 tablet (5 mg total) by mouth 2 (two)  times daily. 60 tablet 3  . bisacodyl (DUCODYL) 5 MG EC tablet Take 5 mg by mouth daily as needed for moderate constipation.    Marland Kitchen diltiazem (CARDIZEM CD) 240 MG 24 hr capsule Take 1 capsule (240 mg total) by mouth daily. 90 capsule 3  . dofetilide (TIKOSYN) 500 MCG capsule Take 1 capsule (500 mcg total) by mouth 2 (two) times daily. 60 capsule 3  . fluticasone (FLONASE) 50 MCG/ACT nasal spray Place 2 sprays into both nostrils daily. 16 g 5  . furosemide (LASIX) 20 MG tablet Take 1 tablet (20 mg total) by mouth daily. (Patient taking differently: Take 20 mg by mouth 2 (two) times daily. ) 30 tablet 2  . HYDROcodone-acetaminophen (NORCO) 10-325 MG tablet Take 1 tablet by mouth 3 (three) times daily as needed. 90 tablet 0  . Magnesium 200 MG TABS Take 1 tablet (200 mg total) by mouth daily. 30 each   . omeprazole (PRILOSEC) 20 MG capsule Take 20 mg by mouth daily as needed. Reported on 07/21/2015    . Polyethyl Glycol-Propyl Glycol (SYSTANE) 0.4-0.3 % GEL ophthalmic gel Place 1 application into both eyes daily as needed.    . potassium chloride SA (K-DUR,KLOR-CON) 20 MEQ tablet Take 1 tablet (20 mEq total) by mouth daily. Take 2 tablets today then take 1 tablet daily. (Patient taking differently: Take 20 mEq by mouth daily. ) 31 tablet 0  . predniSONE (DELTASONE) 10 MG tablet Take 10 mg by mouth daily with breakfast.   11   No current facility-administered medications for this encounter.     Allergies  Allergen Reactions  . Oxycodone Itching  . Pravastatin Other (See Comments)    Chills    . Simvastatin Other (See Comments)    Chills    . Prednisone Anxiety    Irritation  . Tegretol [Carbamazepine] Other (See Comments)    dizziness    Social History   Social History  . Marital status: Married    Spouse name: N/A  . Number of children: N/A  . Years of education: N/A   Occupational History  . Not on file.   Social History Main Topics  . Smoking status: Former Smoker     Packs/day: 0.50    Years: 52.00    Types: Cigarettes    Quit date: 09/08/2003  . Smokeless tobacco: Never Used  . Alcohol use No     Comment: quit drinking 2004  . Drug use: No  . Sexual activity: Yes    Birth control/ protection: None   Other Topics Concern  . Not on file   Social History Narrative  . No narrative on file    Family History  Problem Relation Age of Onset  . Hypertension Mother   . Congestive Heart Failure Mother   . COPD Mother   . Stroke Father   . Cancer Sister  breast    ROS- All systems are reviewed and negative except as per the HPI above  Physical Exam: Vitals:   03/07/16 0841  BP: 136/78  Pulse: 77  Weight: 264 lb 6.4 oz (119.9 kg)  Height: 6\' 1"  (1.854 m)   Wt Readings from Last 3 Encounters:  03/07/16 264 lb 6.4 oz (119.9 kg)  02/29/16 267 lb 9.6 oz (121.4 kg)  02/19/16 261 lb 6.4 oz (118.6 kg)    Labs: Lab Results  Component Value Date   NA 138 03/07/2016   K 3.7 03/07/2016   CL 100 (L) 03/07/2016   CO2 28 03/07/2016   GLUCOSE 199 (H) 03/07/2016   BUN 7 03/07/2016   CREATININE 0.96 03/07/2016   CALCIUM 9.1 03/07/2016   MG 1.9 03/07/2016   Lab Results  Component Value Date   INR 1.1 12/12/2015   Lab Results  Component Value Date   CHOL 145 06/13/2015   HDL 33 (L) 06/13/2015   LDLCALC 92 06/13/2015   TRIG 101 06/13/2015     GEN- The patient is well appearing, alert and oriented x 3 today.   Head- normocephalic, atraumatic Eyes-  Sclera clear, conjunctiva pink Ears- hearing intact Oropharynx- clear Neck- supple, no JVP Lymph- no cervical lymphadenopathy Lungs- Clear to ausculation bilaterally, normal work of breathing Heart- regular rate and rhythm, no murmurs, rubs or gallops, PMI not laterally displaced GI- soft, NT, ND, + BS Extremities- no clubbing, cyanosis, or  Trace to 1+ pitting edema MS- no significant deformity or atrophy Skin- no rash or lesion Psych- euthymic mood, full affect Neuro- strength and  sensation are intact  EKG-Initial EKG showed SR at 77 bpm with pr int 124 ms, qrs int 88 ms, qtc 563 ms Repeat EKG shows qtc at 513 ms and when EKG read by Dr. Doylene Canard at  qtc read as 498 ms and 482 ms Epic records reviewed   Assessment and Plan: 1. Persistent symptomatic afib Maintainig SR since starting Tikosyn but qtc prolonged since pt started on lasix and doubled up on his own accord Has lost 3 lbs but feels he has more fluid to loose Now with K+ at 3.7 and mag at 1.9, not optimal levels with tikosyn on aboard, want K+ above 4 and mag above 2. Creatinine sable Discussed with Dr. Curt Bears and he suggested to replete electrolytes, hold tikosyn x one dose, and see back in am with EKG and labs. He will take (2) K+ 20 meq and (2) mag supplement pills on arriving home and repeat in am. No pm dose of lasix Take  pills in am including tikosyn and will see early in office He will continue on apixaban, states no missed doses   Butch Penny C. Amarachukwu Lakatos, [facility] [address] [place], [place] [address] [phone]

## 2016-03-07 NOTE — Telephone Encounter (Signed)
Contacted [employer] at [phone] and they stated they did not have a PA for this pt on file. Asked for the coverage determination form be faxed. I have received the forms and faxed back 03/07/16 to [phone]. Forms placed in pending PA folder, received confirmation that fax was successful . Expected response is 72 hours.

## 2016-03-07 NOTE — Telephone Encounter (Signed)
Attempted to contact [employer] was on hold for 10+ min.s  Will contact them back later

## 2016-03-07 NOTE — Patient Instructions (Addendum)
HOLD Tikosyn tonight, NO evening Lasix When you get home take 2 potassium and 2 magnesium pills.  Please eat with these medications   In the morning take 2 potassium and 2 magnesium as well.   Take ONE lasix

## 2016-03-07 NOTE — Telephone Encounter (Signed)
Calling to get a verbal on a PA call back [phone].Hillery Hunter

## 2016-03-08 ENCOUNTER — Encounter (HOSPITAL_COMMUNITY): Payer: Self-pay | Admitting: Nurse Practitioner

## 2016-03-08 ENCOUNTER — Telehealth: Payer: Self-pay | Admitting: Internal Medicine

## 2016-03-08 ENCOUNTER — Ambulatory Visit (HOSPITAL_COMMUNITY)
Admission: RE | Admit: 2016-03-08 | Discharge: 2016-03-08 | Disposition: A | Discharge status: Home / Self Care | Payer: PPO | Source: Ambulatory Visit | Attending: Nurse Practitioner | Admitting: Nurse Practitioner

## 2016-03-08 VITALS — BP 118/72 | HR 84 | Ht 73.0 in | Wt 262.0 lb

## 2016-03-08 DIAGNOSIS — K219 Gastro-esophageal reflux disease without esophagitis: Secondary | ICD-10-CM | POA: Diagnosis not present

## 2016-03-08 DIAGNOSIS — Z9889 Other specified postprocedural states: Secondary | ICD-10-CM | POA: Insufficient documentation

## 2016-03-08 DIAGNOSIS — Z803 Family history of malignant neoplasm of breast: Secondary | ICD-10-CM | POA: Diagnosis not present

## 2016-03-08 DIAGNOSIS — Z888 Allergy status to other drugs, medicaments and biological substances status: Secondary | ICD-10-CM | POA: Insufficient documentation

## 2016-03-08 DIAGNOSIS — Z9049 Acquired absence of other specified parts of digestive tract: Secondary | ICD-10-CM | POA: Insufficient documentation

## 2016-03-08 DIAGNOSIS — I481 Persistent atrial fibrillation: Secondary | ICD-10-CM | POA: Insufficient documentation

## 2016-03-08 DIAGNOSIS — Z8249 Family history of ischemic heart disease and other diseases of the circulatory system: Secondary | ICD-10-CM | POA: Insufficient documentation

## 2016-03-08 DIAGNOSIS — H409 Unspecified glaucoma: Secondary | ICD-10-CM | POA: Diagnosis not present

## 2016-03-08 DIAGNOSIS — I4581 Long QT syndrome: Secondary | ICD-10-CM | POA: Insufficient documentation

## 2016-03-08 DIAGNOSIS — I4819 Other persistent atrial fibrillation: Secondary | ICD-10-CM

## 2016-03-08 DIAGNOSIS — Z825 Family history of asthma and other chronic lower respiratory diseases: Secondary | ICD-10-CM | POA: Insufficient documentation

## 2016-03-08 DIAGNOSIS — J84112 Idiopathic pulmonary fibrosis: Secondary | ICD-10-CM | POA: Insufficient documentation

## 2016-03-08 DIAGNOSIS — Z79899 Other long term (current) drug therapy: Secondary | ICD-10-CM | POA: Diagnosis not present

## 2016-03-08 DIAGNOSIS — I10 Essential (primary) hypertension: Secondary | ICD-10-CM | POA: Diagnosis not present

## 2016-03-08 DIAGNOSIS — J45909 Unspecified asthma, uncomplicated: Secondary | ICD-10-CM | POA: Insufficient documentation

## 2016-03-08 DIAGNOSIS — Z823 Family history of stroke: Secondary | ICD-10-CM | POA: Insufficient documentation

## 2016-03-08 DIAGNOSIS — E785 Hyperlipidemia, unspecified: Secondary | ICD-10-CM | POA: Insufficient documentation

## 2016-03-08 DIAGNOSIS — Z87891 Personal history of nicotine dependence: Secondary | ICD-10-CM | POA: Insufficient documentation

## 2016-03-08 LAB — BASIC METABOLIC PANEL
Anion gap: 9 (ref 5–15)
BUN: 7 mg/dL (ref 6–20)
CALCIUM: 9.3 mg/dL (ref 8.9–10.3)
CO2: 26 mmol/L (ref 22–32)
CREATININE: 0.93 mg/dL (ref 0.61–1.24)
Chloride: 103 mmol/L (ref 101–111)
GFR calc Af Amer: >60 mL/min (ref >60)
GLUCOSE: 124 mg/dL — AB (ref 65–99)
Potassium: 4.3 mmol/L (ref 3.5–5.1)
Sodium: 138 mmol/L (ref 135–145)

## 2016-03-08 LAB — MAGNESIUM: Magnesium: 2.2 mg/dL (ref 1.7–2.4)

## 2016-03-08 NOTE — Progress Notes (Signed)
Primary Care Physician: Sallee Lange, MD Referring Physician: Dr. Jerolyn Center Curtis [name] is a 71 y.o. male with a h/o of persistent afib in the atrial fibrillation clinic for evaluation.  H/o new onset since July of  by ECG at Dr Lance Sell office. History of COPD and chronic interstitial lung dx with dyspnea and fatigue Previously seen by cardiology 2013 and had normal myoview study with EF 61%.  Done for atypical chest pain. History of HTN and elevated lipids but Intolerant to statins. Is followed at [facility] for UIP and instertial lung disease .  He has a successful  cardioversion 12/5 which held pt in rhythm x 2 weeks. He saw a significant improvement in SR. He is on apixaban 5 mg bid and takes without fail.On initial visit, various antiarrythmic's were discussed with wife and pt and they wanted to look into costs. He was also on prednisone and levaquin at the time for UTI and wanted to finish. He checked prices on drugs and reviewed options again. He does want to try flecainide but will need a stress test since last test was 5 years ok and if ok will start drug right away. Stress test showed perfusion defect and Dt. Allred recommended avoiding flecainide for possibility of CAD. He was admitted to [facility] for tikosyn .  F/u's 2/22 after successful tikosyn load. He did convert without cardioversion. Qtc stayed stable. HCTZ was stopped and  he has picked up 7 lbs and has LLE.   He was started on lasix 20 mg a day and was asked to f/u in one week with bmet/amg and EKG. He was not losing weight fast enough so he doubled lasix to 40 mg a day. His qtc has prolonged and repeat  bmet/mag show a k+/ mag of 3.7/1.9 .otherwise is in SR.   Returns 3/2 for f/u ekg and labs. He held tikosyn x one dose last night and doubled up on K+ and mag. Yesterday and today. QTC is improved today and k+/mag are in normal range.  Today, he denies symptoms of palpitations,  orthopnea, PND, lower extremity edema, dizziness,  presyncope, syncope, or neurologic sequela.  Positive for shortness of breath and fatigue, palpitations.The patient is tolerating medications without difficulties and is otherwise without complaint today.   Past Medical History:  Diagnosis Date  . Arthritis   . Asthma   . Cervical spondylosis   . Chest pain    Emergency room October 24, 2011, no MI  //   Nuclear, October, 2013, adenosine, EF 61%, no scar or ischemia  . Dyslipidemia    Statin intolerant  . GERD (gastroesophageal reflux disease)   . Glaucoma   . Hearing loss   . Hypertension   . Idiopathic pulmonary fibrosis ([place])   . Interstitial pulmonary fibrosis ([place]) 2012  . Left shoulder pain 2011  . Psoriasis   . Statin intolerance    Past Surgical History:  Procedure Laterality Date  . BRAIN SURGERY    . CARDIOVERSION N/A 12/13/2015   Procedure: CARDIOVERSION;  Surgeon: Josue Hector, MD;  Location: [place];  Service: Cardiovascular;  Laterality: N/A;  . CATARACT EXTRACTION W/PHACO Right 06/11/2012   Procedure: CATARACT EXTRACTION PHACO AND INTRAOCULAR LENS PLACEMENT ([place]);  Surgeon: Tonny Branch, MD;  Location: [facility];  Service: Ophthalmology;  Laterality: Right;  CDE:  14.43  . CATARACT EXTRACTION W/PHACO Left 06/29/2012   Procedure: CATARACT EXTRACTION PHACO AND INTRAOCULAR LENS PLACEMENT (I[facility]);  Surgeon: Tonny Branch, MD;  Location: [facility];  Service: Ophthalmology;  Laterality: Left;  CDE:14.11  . CHOLECYSTECTOMY  05/08/2012  . CHOLECYSTECTOMY N/A 05/08/2012   Procedure: LAPAROSCOPIC CHOLECYSTECTOMY;  Surgeon: Harl Bowie, MD;  Location: [place];  Service: General;  Laterality: N/A;  . COLONOSCOPY    . LIVER BIOPSY N/A 05/08/2012   Procedure: LIVER BIOPSY;  Surgeon: Harl Bowie, MD;  Location: [place];  Service: General;  Laterality: N/A;  . NECK SURGERY     cervical disc with cliip    Current Outpatient Prescriptions  Medication Sig Dispense Refill  . albuterol (PROVENTIL HFA;VENTOLIN HFA) 108 (90 Base) MCG/ACT  inhaler Inhale 2 puffs into the lungs every 6 (six) hours as needed for wheezing. 1 Inhaler 2  . apixaban (ELIQUIS) 5 MG TABS tablet Take 1 tablet (5 mg total) by mouth 2 (two) times daily. 60 tablet 3  . bisacodyl (DUCODYL) 5 MG EC tablet Take 5 mg by mouth daily as needed for moderate constipation.    Marland Kitchen diltiazem (CARDIZEM CD) 240 MG 24 hr capsule Take 1 capsule (240 mg total) by mouth daily. 90 capsule 3  . dofetilide (TIKOSYN) 500 MCG capsule Take 1 capsule (500 mcg total) by mouth 2 (two) times daily. 60 capsule 3  . fluticasone (FLONASE) 50 MCG/ACT nasal spray Place 2 sprays into both nostrils daily. 16 g 5  . furosemide (LASIX) 20 MG tablet Take 1 tablet (20 mg total) by mouth daily. (Patient taking differently: Take 20 mg by mouth 2 (two) times daily. ) 30 tablet 2  . HYDROcodone-acetaminophen (NORCO) 10-325 MG tablet Take 1 tablet by mouth 3 (three) times daily as needed. 90 tablet 0  . Magnesium 200 MG TABS Take 1 tablet (200 mg total) by mouth daily. 30 each   . omeprazole (PRILOSEC) 20 MG capsule Take 20 mg by mouth daily as needed. Reported on 07/21/2015    . Polyethyl Glycol-Propyl Glycol (SYSTANE) 0.4-0.3 % GEL ophthalmic gel Place 1 application into both eyes daily as needed.    . potassium chloride SA (K-DUR,KLOR-CON) 20 MEQ tablet Take 1 tablet (20 mEq total) by mouth daily. Take 2 tablets today then take 1 tablet daily. (Patient taking differently: Take 20 mEq by mouth daily. ) 31 tablet 0  . predniSONE (DELTASONE) 10 MG tablet Take 10 mg by mouth daily with breakfast.   11   No current facility-administered medications for this encounter.     Allergies  Allergen Reactions  . Oxycodone Itching  . Pravastatin Other (See Comments)    Chills    . Simvastatin Other (See Comments)    Chills    . Prednisone Anxiety    Irritation  . Tegretol [Carbamazepine] Other (See Comments)    dizziness    Social History   Social History  . Marital status: Married    Spouse name:  N/A  . Number of children: N/A  . Years of education: N/A   Occupational History  . Not on file.   Social History Main Topics  . Smoking status: Former Smoker    Packs/day: 0.50    Years: 52.00    Types: Cigarettes    Quit date: 09/08/2003  . Smokeless tobacco: Never Used  . Alcohol use No     Comment: quit drinking 2004  . Drug use: No  . Sexual activity: Yes    Birth control/ protection: None   Other Topics Concern  . Not on file   Social History Narrative  . No narrative on file    Family History  Problem Relation Age of Onset  . Hypertension Mother   . Congestive Heart Failure Mother   . COPD Mother   . Stroke Father   . Cancer Sister     breast    ROS- All systems are reviewed and negative except as per the HPI above  Physical Exam: Vitals:   03/08/16 0901  BP: 118/72  Pulse: 84  Weight: 262 lb (118.8 kg)  Height: 6\' 1"  (1.854 m)   Wt Readings from Last 3 Encounters:  03/08/16 262 lb (118.8 kg)  03/07/16 264 lb 6.4 oz (119.9 kg)  02/29/16 267 lb 9.6 oz (121.4 kg)    Labs: Lab Results  Component Value Date   NA 138 03/08/2016   K 4.3 03/08/2016   CL 103 03/08/2016   CO2 26 03/08/2016   GLUCOSE 124 (H) 03/08/2016   BUN 7 03/08/2016   CREATININE 0.93 03/08/2016   CALCIUM 9.3 03/08/2016   MG 2.2 03/08/2016   Lab Results  Component Value Date   INR 1.1 12/12/2015   Lab Results  Component Value Date   CHOL 145 06/13/2015   HDL 33 (L) 06/13/2015   LDLCALC 92 06/13/2015   TRIG 101 06/13/2015     GEN- The patient is well appearing, alert and oriented x 3 today.   Head- normocephalic, atraumatic Eyes-  Sclera clear, conjunctiva pink Ears- hearing intact Oropharynx- clear Neck- supple, no JVP Lymph- no cervical lymphadenopathy Lungs- Clear to ausculation bilaterally, normal work of breathing Heart- regular rate and rhythm, no murmurs, rubs or gallops, PMI not laterally displaced GI- soft, NT, ND, + BS Extremities- no clubbing, cyanosis,  or  Trace to 1+ pitting edema MS- no significant deformity or atrophy Skin- no rash or lesion Psych- euthymic mood, full affect Neuro- strength and sensation are intact  EKG- NSR at 84 bpm, pr int 126 ms, qrs int 84 ms, qtc 453 ms K+ 4.3, mag 2.2 Epic records reviewed   Assessment and Plan: 1. Persistent symptomatic afib Maintainig SR since starting Tikosyn but qtc prolonged since pt started on lasix and doubled up on his own accord K+ and mag are back to normal and qtc is back in normal range Continue one k+ and one mag supplement a day He is aware not to take more than 20 mg lasix a day He will continue on apixaban, states no missed doses  F/u in one week with labs and ekg   Butch Penny C. Corena Tilson, [facility] [address] [place], [place] [address] [phone]

## 2016-03-08 NOTE — Telephone Encounter (Signed)
Spoke with patient and his wife-they are aware of high cost of medication and to contact [employer] to seek funds for cost of Esbriet. They will contact us back to let us know the out come.   Will forward to [facility]

## 2016-03-08 NOTE — Telephone Encounter (Signed)
Pt will need to call [employer] to let them know that he is unable afford medication, they will refer him to [employer] to see if he qualifies to get free medication or to help refer him to a third party for financial assistance.

## 2016-03-08 NOTE — Telephone Encounter (Addendum)
Spoke with Beverlee Nims with [facility], who states pt was refusing services for esbriet due to not being familiar with provider. I have spoke with pt who states [facility] was using MR's first name, and was not familiar with that name. Pt still agreed with starting esbriet. I spoke with Reggie at [facility], and ask that he contact pt to schedule for delivery. Reggie states pt's co pay will be $2325.99. There are currently no funds available with foundation.  Will route to [place] [facility] to make aware.

## 2016-03-12 NOTE — Telephone Encounter (Signed)
Medication has been approved from 3.1.2018 - 12.31.2018. Please see phone note from 3.2.2018 regarding pt assistance for pt's Esbriet. Will sign off.

## 2016-03-13 NOTE — Telephone Encounter (Signed)
Spoke with pt, who states he contacted [employer] on Monday, who will be mailing him an application for assistant with Esbriet. Pt states he will contact us to make [place] aware of out come. Will route to [place].

## 2016-03-15 ENCOUNTER — Other Ambulatory Visit (HOSPITAL_COMMUNITY): Payer: Self-pay | Admitting: *Deleted

## 2016-03-15 ENCOUNTER — Ambulatory Visit (HOSPITAL_COMMUNITY)
Admission: RE | Admit: 2016-03-15 | Discharge: 2016-03-15 | Disposition: A | Discharge status: Home / Self Care | Payer: PPO | Source: Ambulatory Visit | Attending: Internal Medicine | Admitting: Internal Medicine

## 2016-03-15 ENCOUNTER — Encounter: Payer: Self-pay | Admitting: Internal Medicine

## 2016-03-15 ENCOUNTER — Ambulatory Visit (HOSPITAL_COMMUNITY)
Admission: RE | Admit: 2016-03-15 | Discharge: 2016-03-15 | Disposition: A | Discharge status: Home / Self Care | Payer: PPO | Source: Ambulatory Visit | Attending: Nurse Practitioner | Admitting: Nurse Practitioner

## 2016-03-15 ENCOUNTER — Encounter (HOSPITAL_COMMUNITY): Payer: Self-pay | Admitting: Nurse Practitioner

## 2016-03-15 ENCOUNTER — Ambulatory Visit (INDEPENDENT_AMBULATORY_CARE_PROVIDER_SITE_OTHER): Payer: PPO | Admitting: Internal Medicine

## 2016-03-15 DIAGNOSIS — J84112 Idiopathic pulmonary fibrosis: Secondary | ICD-10-CM | POA: Diagnosis not present

## 2016-03-15 DIAGNOSIS — I4891 Unspecified atrial fibrillation: Secondary | ICD-10-CM | POA: Diagnosis not present

## 2016-03-15 DIAGNOSIS — Z77090 Contact with and (suspected) exposure to asbestos: Secondary | ICD-10-CM | POA: Diagnosis not present

## 2016-03-15 LAB — BASIC METABOLIC PANEL
ANION GAP: 9 (ref 5–15)
BUN: 8 mg/dL (ref 6–20)
CALCIUM: 9.1 mg/dL (ref 8.9–10.3)
CHLORIDE: 104 mmol/L (ref 101–111)
CO2: 26 mmol/L (ref 22–32)
Creatinine, Ser: 1.07 mg/dL (ref 0.61–1.24)
GFR calc non Af Amer: >60 mL/min (ref >60)
GLUCOSE: 208 mg/dL — AB (ref 65–99)
POTASSIUM: 3.8 mmol/L (ref 3.5–5.1)
Sodium: 139 mmol/L (ref 135–145)

## 2016-03-15 LAB — MAGNESIUM: Magnesium: 1.9 mg/dL (ref 1.7–2.4)

## 2016-03-15 MED ORDER — DOFETILIDE 250 MCG PO CAPS: 250.0000 ug | ORAL_CAPSULE | Freq: Two times a day (BID) | ORAL | 6 refills | Status: DC

## 2016-03-15 MED ORDER — MAGNESIUM 200 MG PO TABS: 200.0000 mg | ORAL_TABLET | Freq: Two times a day (BID) | ORAL | Status: DC

## 2016-03-15 MED ORDER — APIXABAN 5 MG PO TABS: 5.0000 mg | ORAL_TABLET | Freq: Two times a day (BID) | ORAL | 3 refills | Status: DC

## 2016-03-15 MED ORDER — POTASSIUM CHLORIDE CRYS ER 20 MEQ PO TBCR: 20.0000 meq | EXTENDED_RELEASE_TABLET | Freq: Two times a day (BID) | ORAL | Status: DC

## 2016-03-15 MED ORDER — ALBUTEROL SULFATE (2.5 MG/3ML) 0.083% IN NEBU
2.5000 mg | INHALATION_SOLUTION | Freq: Once | RESPIRATORY_TRACT | Status: AC
Start: 2016-03-15 — End: 2016-03-15
  Administered 2016-03-15: 2.5 mg via RESPIRATORY_TRACT

## 2016-03-15 MED ORDER — DOFETILIDE 125 MCG PO CAPS: 125.0000 ug | ORAL_CAPSULE | Freq: Two times a day (BID) | ORAL | 3 refills | Status: DC

## 2016-03-15 NOTE — Assessment & Plan Note (Signed)
Even without pleural plaques is as likely that your ILD disease is "asbestosis" as is "IPF" variety. We will do a letter 03/15/2016 stating that

## 2016-03-15 NOTE — Progress Notes (Addendum)
Pt in for repeat EKG and BMET/MG today.  Pt BP today 118/72 and HR today was 82. EKG to be reviewed by Roderic Palau, NP  Review of EKG shows QTC has again prolonged to 542 ms. Bmet/mag pending but he has not used any extra lasix, but is c/o that LEE is worse off HCTZ. Do not want to increase lasix for now due to prolonged qtc. I discussed with Dr. Curt Bears and will hold dose of tikosyn tonight and restart with 375 mg bid. He will return on Monday with repeat EKG. K+ low at 3.8 and mag at 1.9 will double current dose of  K+/mag

## 2016-03-15 NOTE — Progress Notes (Signed)
Subjective:     Patient ID: Curtis [name], male   DOB: 1945-05-03, 71 y.o.   MRN: [id number]  HPI   PCP Curtis Lange, MD  HPI  IOV 02/09/2016  Chief Complaint  Patient presents with  . Pulmonary Consult    Referred by Dr. Wolfgang Phoenix for pulomnary fibrosis, Pt. states his breathing is ok, Pt. c/o of sob with exertion, slight cough, Denies chest tightness    71 year old male accompanied by his wife Curtis [name]. He has a diagnosis of only fibrosis with high pretest probability of idiopathic Pulmonray fibrosis being managed at [facility] by Dr. Wynn [name] and is assistant Curtis [name]. History is gained from him, talking to his wife and review and summarization of the [employer]. He has had remote asbestos exposure while working in [employer]. 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 He also has a 26 pack smoking history which he quit in 2004. His pulmonary imaging has never showed pleural plaques. He's had insidious worsening of dyspnea for many years. His CT scans at [facility] are reported as possible UIP. He's also been maintained on low-dose prednisone which according to the review of [employer] notes in this has helped him. He is low antibody titer positive for ANA and MI 2 in 2014 . Diagnosis given was of IPF. In October 2017 follow-up visit to [facility] according to the patient and review of the charts show 11/01/2015 high-resolution CT chest showed progression of disease or lung disease compared to 2014 and change in the pattern from possible UIP per definitive UIP. In addition there is also lingula nodule that measured 8 mm up from 6 mm in 2014. Given the progression and the definite change in CT chest the diagnosis of definite of UIP/IPF was made and the patient was counseled about starting specific anti-fibrotic therapy. He and his wife are fully aware of the 2 anti-fibrotic regimens of Pirfenidone (Esbriet) and Ofev. He says his  Coumadin to starting one of these at our consult.   He also tells me the year 2017 he's had several setbacks with this helped including atrial fibrillation and he is now on anticoagulations wary of taking Ofev. He is also had Bell's palsy and an neurologic issue. Taking into account of all this and is visible obesity feels dyspnea is worsened because of lung disease.  He is also wondering how much of his pulmonary fibrosis is related to previous asbestos exposure.  In terms of disease severity: He uses oxygen with exertion. In 2013 FVC and DLCO 60% each. In January 2017 his FVC is 2.2 L/42% predicted with FEV1 of 1.6 L/42% predicted, TLC of 47% predicted and DLCO 14.2/55% predicted suggesting overall slow steady progression.  In terms of comorbidities nuclear medicine cardiac stress of 02/02/2016 was low risk  Diffuse parenchymal lung disease prob list in [facility] notes copied and pasted and summarized A. Possible UIP pattern by current ATS criteria -> changed to progression and definite UIP Oct 2017 B. Significant asbestos exposure in [employer]  C. 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 Subjective improvement with Advair; no air trapping seen on CT  E. No symptoms suggestive of CTD; labs show aldolase of 10.2 and weak positive MI-2.  F. TPMT level is 8.7 (low), indicating he may not metabolize azathioprine effectively.  2. Exertional hypoxia, improved.  OV 03/15/2016  Chief Complaint  Patient presents with  . Follow-up  Pt states he is waiting on a form from [place] to start his Esbriet. Pt states his breathing is unchanged since last OV. Pt here after PFT.      Follow-up idiopathic pulmonary fibrosis also as Mrs. exposure but without pleural plaques  He presents with his son and? Daughter. He is here to discuss esbriet Start is going. However he has run into problems with charity and paperwork. In addition apparently [place] did not disclose my last name insert  resulted in confusion. Apparently they disclose only the first name. Overall he is stable.. Pulmonary function testing done today at [facility] but the result is not yet available. We discussed some of the remaining items such as a letter about asbestos exposure causing ILD, pulmonary fibrosis foundation support group and referred to pulmonary rehabilitation.  Results for CADEL, STAIRS (MRN [id number]) as of 03/15/2016 11:47  Ref. Range 02/21/2016 04:25 02/29/2016 10:00 03/07/2016 08:52 03/08/2016 09:00 03/15/2016 10:15  Creatinine Latest Ref Range: 0.61 - 1.24 mg/dL 0.85 0.84 0.96 0.93 1.07   Results for Curtis, [name] (MRN [id number]) as of 03/15/2016 11:47  Ref. Range 02/21/2016 04:25 02/29/2016 10:00 03/07/2016 08:52 03/08/2016 09:00 03/15/2016 10:15  EGFR (Non-African Amer.) Latest Ref Range: >60 mL/min >60 >60 >60 >60 >60    has a past medical history of Arthritis; Asthma; Cervical spondylosis; Chest pain; Dyslipidemia; GERD (gastroesophageal reflux disease); Glaucoma; Hearing loss; Hypertension; Idiopathic pulmonary fibrosis ([address]); Interstitial pulmonary fibrosis ([place]) (2012); Left shoulder pain (2011); Psoriasis; and Statin intolerance.   reports that he quit smoking about 12 years ago. His smoking use included Cigarettes. He has a 26.00 pack-year smoking history. He has never used smokeless tobacco.  Past Surgical History:  Procedure Laterality Date  . BRAIN SURGERY    . CARDIOVERSION N/A 12/13/2015   Procedure: CARDIOVERSION;  Surgeon: Josue Hector, MD;  Location: [place];  Service: Cardiovascular;  Laterality: N/A;  . CATARACT EXTRACTION W/PHACO Right 06/11/2012   Procedure: CATARACT EXTRACTION PHACO AND INTRAOCULAR LENS PLACEMENT ([place]);  Surgeon: Tonny Branch, MD;  [facility];  Service: Ophthalmology;  Laterality: Right;  CDE:  14.43  . CATARACT EXTRACTION W/PHACO Left 06/29/2012   Procedure: CATARACT EXTRACTION PHACO AND INTRAOCULAR LENS PLACEMENT ([facility]);  Surgeon: Tonny Branch, MD;  [facility]: [facility];  Service: Ophthalmology;  Laterality: Left;  CDE:14.11  . CHOLECYSTECTOMY  05/08/2012  . CHOLECYSTECTOMY N/A 05/08/2012   Procedure: LAPAROSCOPIC CHOLECYSTECTOMY;  Surgeon: Harl Bowie, MD;  Location: [place];  Service: General;  Laterality: N/A;  . COLONOSCOPY    . LIVER BIOPSY N/A 05/08/2012   Procedure: LIVER BIOPSY;  Surgeon: Harl Bowie, MD;  Location: [place];  Service: General;  Laterality: N/A;  . NECK SURGERY     cervical disc with cliip    Allergies  Allergen Reactions  . Oxycodone Itching  . Pravastatin Other (See Comments)    Chills    . Simvastatin Other (See Comments)    Chills    . Prednisone Anxiety    Irritation  . Tegretol [Carbamazepine] Other (See Comments)    dizziness    Immunization History  Administered Date(s) Administered  . Influenza Split 10/04/2010, 09/28/2011  . Influenza,inj,Quad PF,36+ Mos 09/28/2013, 10/17/2014, 10/23/2015  . Influenza-Unspecified 09/08/2011  . Pneumococcal Conjugate-13 10/03/2009, 09/28/2013  . Pneumococcal Polysaccharide-23 01/08/2008, 10/03/2009  . Td 10/07/2009    Family History  Problem Relation Age of Onset  . Hypertension Mother   . Congestive Heart Failure Mother   . COPD Mother   .  Stroke Father   . Cancer Sister     breast     Current Outpatient Prescriptions:  .  albuterol (PROVENTIL HFA;VENTOLIN HFA) 108 (90 Base) MCG/ACT inhaler, Inhale 2 puffs into the lungs every 6 (six) hours as needed for wheezing., Disp: 1 Inhaler, Rfl: 2 .  apixaban (ELIQUIS) 5 MG TABS tablet, Take 1 tablet (5 mg total) by mouth 2 (two) times daily., Disp: 180 tablet, Rfl: 3 .  bisacodyl (DUCODYL) 5 MG EC tablet, Take 5 mg by mouth daily as needed for moderate constipation., Disp: , Rfl:  .  dofetilide (TIKOSYN) 125 MCG capsule, Take 1 capsule (125 mcg total) by mouth 2 (two) times daily. Take along with 27mg twice a day to total 3774m, Disp: 60 capsule, Rfl: 3 .  dofetilide (TIKOSYN) 250 MCG  capsule, Take 1 capsule (250 mcg total) by mouth 2 (two) times daily. Take along with 12515mtwice a day to total 375m79misp: 60 capsule, Rfl: 6 .  fluticasone (FLONASE) 50 MCG/ACT nasal spray, Place 2 sprays into both nostrils daily., Disp: 16 g, Rfl: 5 .  furosemide (LASIX) 20 MG tablet, Take 1 tablet (20 mg total) by mouth daily. (Patient taking differently: Take 20 mg by mouth 2 (two) times daily. ), Disp: 30 tablet, Rfl: 2 .  HYDROcodone-acetaminophen (NORCO) 10-325 MG tablet, Take 1 tablet by mouth 3 (three) times daily as needed., Disp: 90 tablet, Rfl: 0 .  Magnesium 200 MG TABS, Take 1 tablet (200 mg total) by mouth daily., Disp: 30 each, Rfl:  .  omeprazole (PRILOSEC) 20 MG capsule, Take 20 mg by mouth daily as needed. Reported on 07/21/2015, Disp: , Rfl:  .  Polyethyl Glycol-Propyl Glycol (SYSTANE) 0.4-0.3 % GEL ophthalmic gel, Place 1 application into both eyes daily as needed., Disp: , Rfl:  .  potassium chloride SA (K-DUR,KLOR-CON) 20 MEQ tablet, Take 1 tablet (20 mEq total) by mouth daily. Take 2 tablets today then take 1 tablet daily. (Patient taking differently: Take 20 mEq by mouth daily. ), Disp: 31 tablet, Rfl: 0 .  predniSONE (DELTASONE) 10 MG tablet, Take 10 mg by mouth daily with breakfast. , Disp: , Rfl: 11 .  diltiazem (CARDIZEM CD) 240 MG 24 hr capsule, Take 1 capsule (240 mg total) by mouth daily., Disp: 90 capsule, Rfl: 3    Review of Systems     Objective:   Physical Exam Vitals:   03/15/16 1128  BP: 132/78  Pulse: 85  SpO2: 95%  Weight: 266 lb (120.7 kg)  Height: _0  (1.854 m)    Estimated body mass index is 35.09 kg/m as calculated from the following:   Height as of this encounter: _1  (1.854 m).   Weight as of this encounter: 266 lb (120.7 kg). Mainly discussion Brief exam: oriented x 3. Normal gait. Basal crackles +     Assessment:       ICD-9-CM ICD-10-CM   1. H/O asbestos exposure V15.84 Z77.090   2. IPF (idiopathic pulmonary fibrosis)  (HCC)[employer] [employer]6.31 J84.112        Plan:     H/O asbestos exposure Even without pleural plaques is as likely that your ILD disease is "asbestosis" as is "IPF" variety. We will do a letter 03/15/2016 stating that  IPF (idiopathic pulmonary fibrosis) (HCC)[place] you are on verge of getting esbriet - please work with ElisDaneil Danensure process is ongoing Clinically stable Will call you with PFT result as soon as it feeds into computer Refer pulmonary rehab  Discussed PFF support group - agree you will think about it; will discuss again at last visti Appreciate support in IPF registry trials We will consider you for future research trials  Followup Please call in 1 week if having delays with esbriet REturn in 6 weeks to see me or APP; to see how you are tolerating esbriet  (> 50% of this 15 min visit spent in face to face counseling or/and coordination of care)    Dr. Brand Males, M.D., [facility].C.P Pulmonary and Critical Care Medicine Staff Physician [place] [facility] Pager: [phone], If no answer or between  15:00h - 7:00h: call [phone]  03/15/2016 12:01 PM

## 2016-03-15 NOTE — Patient Instructions (Signed)
H/O asbestos exposure Even without pleural plaques is as likely that your ILD disease is "asbestosis" as is "IPF" variety. We will do a letter 03/15/2016 stating that  IPF (idiopathic pulmonary fibrosis) ([place]) Glad you are on verge of getting esbriet - please work with [name] to ensure process is ongoing Clinically stable Will call you with PFT result as soon as it feeds into computer Refer pulmonary rehab Discussed PFF support group - agree you will think about it; will discuss again at last visti Appreciate support in IPF registry trials We will consider you for future research trials  Followup Please call in 1 week if having delays with esbriet REturn in 6 weeks to see me or APP; to see how you are tolerating esbriet

## 2016-03-15 NOTE — Assessment & Plan Note (Signed)
Glad you are on verge of getting esbriet - please work with [name] to ensure process is ongoing Clinically stable Will call you with PFT result as soon as it feeds into computer Refer pulmonary rehab Discussed PFF support group - agree you will think about it; will discuss again at last [place] support in IPF registry trials We will consider you for future research trials  Followup Please call in 1 week if having delays with esbriet REturn in 6 weeks to see me or APP; to see how you are tolerating esbriet

## 2016-03-15 NOTE — Addendum Note (Signed)
Addended by: Collier Salina on: 03/15/2016 12:09 PM   Modules accepted: Orders

## 2016-03-15 NOTE — Patient Instructions (Signed)
Your physician has recommended you make the following change in your medication: 1)STOP Tikosyn 529mcg -- hold dose of tikosyn tonight 2)Tomorrow morning start Tikosyn 346mcg twice a day --- Take 164mcg tablet along with 224mcg twice a day to total this amount

## 2016-03-17 ENCOUNTER — Other Ambulatory Visit: Payer: Self-pay | Admitting: Cardiovascular Disease

## 2016-03-18 ENCOUNTER — Encounter (HOSPITAL_COMMUNITY): Payer: Medicare Other | Admitting: Nurse Practitioner

## 2016-03-19 ENCOUNTER — Other Ambulatory Visit (HOSPITAL_COMMUNITY): Payer: Self-pay | Admitting: *Deleted

## 2016-03-19 ENCOUNTER — Ambulatory Visit (HOSPITAL_COMMUNITY)
Admission: RE | Admit: 2016-03-19 | Discharge: 2016-03-19 | Disposition: A | Discharge status: Home / Self Care | Payer: PPO | Source: Ambulatory Visit | Attending: Nurse Practitioner | Admitting: Nurse Practitioner

## 2016-03-19 ENCOUNTER — Encounter (HOSPITAL_COMMUNITY): Payer: Self-pay | Admitting: Nurse Practitioner

## 2016-03-19 DIAGNOSIS — I4891 Unspecified atrial fibrillation: Secondary | ICD-10-CM | POA: Diagnosis not present

## 2016-03-19 LAB — BASIC METABOLIC PANEL
Anion gap: 11 (ref 5–15)
BUN: 7 mg/dL (ref 6–20)
CO2: 24 mmol/L (ref 22–32)
CREATININE: 0.91 mg/dL (ref 0.61–1.24)
Calcium: 9.2 mg/dL (ref 8.9–10.3)
Chloride: 102 mmol/L (ref 101–111)
Glucose, Bld: 108 mg/dL — ABNORMAL HIGH (ref 65–99)
Potassium: 4.4 mmol/L (ref 3.5–5.1)
Sodium: 137 mmol/L (ref 135–145)

## 2016-03-19 LAB — MAGNESIUM: Magnesium: 2 mg/dL (ref 1.7–2.4)

## 2016-03-19 MED ORDER — POTASSIUM CHLORIDE CRYS ER 20 MEQ PO TBCR: 20.0000 meq | EXTENDED_RELEASE_TABLET | Freq: Two times a day (BID) | ORAL | 6 refills | Status: DC

## 2016-03-19 NOTE — Progress Notes (Addendum)
Pt in for repeat Bmet and Mg, repeat EKG.  Pt weight today 264.2lb.  EKG HR of 80 showing NSR.  EKG to be reviewed by Roderic Palau, NP  Ekg shows SR and qtc  much improved at 472 ms with holding tikosyn  last Friday pm and reducing dose to 375 mg bid. Mag/Kt repeated and in good range. He was using salt heavily but has cut back on salt and salty foods over the weekend and his weight is down two lbs. On lasix 20 mg qd. F/u with Dr. Curt Bears in 2 weeks with repeat bmet/mag.

## 2016-03-20 LAB — PULMONARY FUNCTION TEST
DL/VA % pred: 98 %
DL/VA: 4.67 ml/min/mmHg/L
DLCO COR % PRED: 36 %
DLCO COR: 13.3 ml/min/mmHg
DLCO UNC % PRED: 36 %
DLCO unc: 13.3 ml/min/mmHg
FEF 25-75 POST: 1.64 L/s
FEF 25-75 PRE: 2.05 L/s
FEF2575-%Change-Post: -19 %
FEF2575-%PRED-PRE: 77 %
FEF2575-%Pred-Post: 62 %
FEV1-%CHANGE-POST: -2 %
FEV1-%PRED-POST: 52 %
FEV1-%PRED-PRE: 53 %
FEV1-POST: 1.86 L
FEV1-Pre: 1.92 L
FEV1FVC-%CHANGE-POST: -3 %
FEV1FVC-%PRED-PRE: 111 %
FEV6-%CHANGE-POST: 0 %
FEV6-%PRED-POST: 51 %
FEV6-%Pred-Pre: 51 %
FEV6-POST: 2.35 L
FEV6-Pre: 2.34 L
FEV6FVC-%Change-Post: 0 %
FEV6FVC-%PRED-POST: 106 %
FEV6FVC-%Pred-Pre: 105 %
FVC-%Change-Post: 0 %
FVC-%PRED-POST: 48 %
FVC-%PRED-PRE: 48 %
FVC-POST: 2.36 L
FVC-Pre: 2.35 L
POST FEV6/FVC RATIO: 100 %
Post FEV1/FVC ratio: 79 %
Pre FEV1/FVC ratio: 81 %
Pre FEV6/FVC Ratio: 99 %
RV % pred: 250 %
RV: 6.66 L
TLC % PRED: 116 %
TLC: 8.87 L

## 2016-03-20 NOTE — Telephone Encounter (Signed)
I contacted patient to get an outcome update. Pt did not answer phone and a voicemail was left instructing patient to contact office. Will route to [place].

## 2016-03-21 NOTE — Telephone Encounter (Signed)
[place] at [phone] and spoke to Desire. She states the pt was given form (copay attestation), this has been mailed to pt on 3.13.18 and Celso Amy is scheduled to f/u with pt on 3.16.18. We will receive an update via fax when this has been processed.

## 2016-03-26 ENCOUNTER — Telehealth: Payer: Self-pay | Admitting: Internal Medicine

## 2016-03-26 DIAGNOSIS — C44319 Basal cell carcinoma of skin of other parts of face: Secondary | ICD-10-CM | POA: Diagnosis not present

## 2016-03-26 DIAGNOSIS — C44309 Unspecified malignant neoplasm of skin of other parts of face: Secondary | ICD-10-CM | POA: Diagnosis not present

## 2016-03-26 DIAGNOSIS — D485 Neoplasm of uncertain behavior of skin: Secondary | ICD-10-CM | POA: Diagnosis not present

## 2016-03-26 NOTE — Telephone Encounter (Signed)
Called [facility] and spoke with Simona Huh, states that pt is being reviewed by the access to care foundation- is needing our office to fill out a PAN form for patient- this form is still requiring a signature for patient.  This form has been filled out incorrectly in the past-patient needs to come in and sign these forms so they can be filled out correctly.  Forms have been filled out to best of my ability and the 3 spots where patient must sign have been highlighted for ease.  lmtcb X1 to make pt aware of this.    Forms placed in envelope up front in brown accordion folder.

## 2016-03-28 NOTE — Telephone Encounter (Signed)
Called and spoke with pt and he was very frustrated since he has spoke to so many people from [facility] and he stated that everytime he speaks with some they tell him he has the wrong forms.  He is aware that Caryl Pina has filled out the forms here in the office and has highlighted where he needs to sign these.  He will come by in the morning to sign these so we may send this form back in for the pt. Will forward to elise to make her aware.

## 2016-03-29 DIAGNOSIS — H02135 Senile ectropion of left lower eyelid: Secondary | ICD-10-CM | POA: Diagnosis not present

## 2016-03-29 DIAGNOSIS — H0289 Other specified disorders of eyelid: Secondary | ICD-10-CM | POA: Diagnosis not present

## 2016-03-29 DIAGNOSIS — H02132 Senile ectropion of right lower eyelid: Secondary | ICD-10-CM | POA: Diagnosis not present

## 2016-03-29 NOTE — Telephone Encounter (Signed)
Please see phone message from 3.20.18. Will sign off.

## 2016-03-29 NOTE — Telephone Encounter (Signed)
Pt came to clinic and signed the forms, the forms have been placed in MR folder at the front checkout area.Curtis [name]

## 2016-03-29 NOTE — Telephone Encounter (Signed)
Curtis [name] the pt came by this morning and signed the forms for [facility].  These have been placed in MR folder at the front office.

## 2016-04-03 ENCOUNTER — Encounter: Payer: PPO | Admitting: Cardiology

## 2016-04-04 DIAGNOSIS — J8410 Pulmonary fibrosis, unspecified: Secondary | ICD-10-CM | POA: Diagnosis not present

## 2016-04-04 NOTE — Telephone Encounter (Signed)
This has been faxed back to [place]. Will keep message open to follow up.

## 2016-04-05 ENCOUNTER — Other Ambulatory Visit: Payer: Self-pay | Admitting: *Deleted

## 2016-04-05 ENCOUNTER — Telehealth: Payer: Self-pay | Admitting: Family Medicine

## 2016-04-05 MED ORDER — ZOLPIDEM TARTRATE 10 MG PO TABS: 10.0000 mg | ORAL_TABLET | Freq: Every evening | ORAL | 3 refills | Status: DC | PRN

## 2016-04-05 NOTE — Telephone Encounter (Signed)
Pt states he has not taken in 6 -8 months. He backed off of it himself due to not needing it. He never took it every night. Just as needed. He is having a rough time sleeping now due to bell's palsy. He states he was taking 10mg .

## 2016-04-05 NOTE — Telephone Encounter (Signed)
ntsw has pt taken in past? What dose? Uses every day or?

## 2016-04-05 NOTE — Telephone Encounter (Signed)
Script faxed. Pt notified.

## 2016-04-05 NOTE — Telephone Encounter (Signed)
Patient requesting Rx for Ambien tartrate.  [facility]

## 2016-04-05 NOTE — Telephone Encounter (Signed)
Numb 30 three ref one qhs rprn

## 2016-04-08 ENCOUNTER — Telehealth: Payer: Self-pay | Admitting: Family Medicine

## 2016-04-08 MED ORDER — ALBUTEROL SULFATE HFA 108 (90 BASE) MCG/ACT IN AERS: 2.0000 | INHALATION_SPRAY | Freq: Four times a day (QID) | RESPIRATORY_TRACT | 2 refills | Status: AC | PRN

## 2016-04-08 NOTE — Telephone Encounter (Signed)
Spoke with patient and informed him refills were sent in on inhaler. Patient verbalized understanding.

## 2016-04-08 NOTE — Telephone Encounter (Signed)
Requesting Rx for albuterol inhaler to [facility].

## 2016-04-09 DIAGNOSIS — H02831 Dermatochalasis of right upper eyelid: Secondary | ICD-10-CM | POA: Diagnosis not present

## 2016-04-09 DIAGNOSIS — Z09 Encounter for follow-up examination after completed treatment for conditions other than malignant neoplasm: Secondary | ICD-10-CM | POA: Diagnosis not present

## 2016-04-09 DIAGNOSIS — H53489 Generalized contraction of visual field, unspecified eye: Secondary | ICD-10-CM | POA: Diagnosis not present

## 2016-04-09 DIAGNOSIS — H02834 Dermatochalasis of left upper eyelid: Secondary | ICD-10-CM | POA: Diagnosis not present

## 2016-04-09 DIAGNOSIS — H02413 Mechanical ptosis of bilateral eyelids: Secondary | ICD-10-CM | POA: Diagnosis not present

## 2016-04-09 DIAGNOSIS — H0279 Other degenerative disorders of eyelid and periocular area: Secondary | ICD-10-CM | POA: Diagnosis not present

## 2016-04-10 NOTE — Telephone Encounter (Signed)
[name] have you heard anything or received any correspondence from [place]? Thanks.

## 2016-04-12 ENCOUNTER — Telehealth: Payer: Self-pay | Admitting: Internal Medicine

## 2016-04-12 ENCOUNTER — Ambulatory Visit (INDEPENDENT_AMBULATORY_CARE_PROVIDER_SITE_OTHER): Payer: PPO | Admitting: Cardiology

## 2016-04-12 ENCOUNTER — Encounter: Payer: Self-pay | Admitting: Cardiology

## 2016-04-12 VITALS — BP 122/80 | HR 79 | Ht 73.25 in | Wt 266.8 lb

## 2016-04-12 DIAGNOSIS — I481 Persistent atrial fibrillation: Secondary | ICD-10-CM

## 2016-04-12 DIAGNOSIS — Z79899 Other long term (current) drug therapy: Secondary | ICD-10-CM

## 2016-04-12 DIAGNOSIS — I4819 Other persistent atrial fibrillation: Secondary | ICD-10-CM

## 2016-04-12 NOTE — Progress Notes (Signed)
Electrophysiology Office Note   Date:  04/12/2016   ID:  Curtis [name], DOB [birth year], MRN [id number]  PCP:  Sallee Lange, MD  Cardiologist:  Johnsie Cancel Primary Electrophysiologist:  Marlaine Arey Meredith Leeds, MD    Chief Complaint  Patient presents with  . Atrial Fibrillation     History of Present Illness: Curtis [name] is a 71 y.o. male who is being seen today for the evaluation of atrial fibillation at the request of Luking, Elayne Snare, MD. Presenting today for electrophysiology evaluation. He has a history of persistent atrial fibrillation status post Tikosyn loading, hypertension, hyperlipidemia, COPD and interstitial lung disease. His Tikosyn load was on 02/22/16.  He has done well. Unfortunately he has gained 8 pounds since being in the hospital. He feels like this is mostly fluid weight, although it could also be due to constipation. He says it is not too: Today.  Today, he denies symptoms of palpitations, chest pain, shortness of breath, orthopnea, PND, lower extremity  claudication, dizziness, presyncope, syncope, bleeding, or neurologic sequela. The patient is tolerating medications without difficulties.    Past Medical History:  Diagnosis Date  . Arthritis   . Asthma   . Cervical spondylosis   . Chest pain    Emergency room October 24, 2011, no MI  //   Nuclear, October, 2013, adenosine, EF 61%, no scar or ischemia  . Dyslipidemia    Statin intolerant  . GERD (gastroesophageal reflux disease)   . Glaucoma   . Hearing loss   . Hypertension   . Idiopathic pulmonary fibrosis ([place])   . Interstitial pulmonary fibrosis ([place]) 2012  . Left shoulder pain 2011  . Psoriasis   . Statin intolerance    Past Surgical History:  Procedure Laterality Date  . BRAIN SURGERY    . CARDIOVERSION N/A 12/13/2015   Procedure: CARDIOVERSION;  Surgeon: Josue Hector, MD;  Location: [facility];  Service: Cardiovascular;  Laterality: N/A;  . CATARACT EXTRACTION W/PHACO Right 06/11/2012     Procedure: CATARACT EXTRACTION PHACO AND INTRAOCULAR LENS PLACEMENT ([place]);  Surgeon: Tonny Branch, MD;  Location: [facility];  Service: Ophthalmology;  Laterality: Right;  CDE:  14.43  . CATARACT EXTRACTION W/PHACO Left 06/29/2012   Procedure: CATARACT EXTRACTION PHACO AND INTRAOCULAR LENS PLACEMENT ([facility]);  Surgeon: Tonny Branch, MD;  Location: [facility];  Service: Ophthalmology;  Laterality: Left;  CDE:14.11  . CHOLECYSTECTOMY  05/08/2012  . CHOLECYSTECTOMY N/A 05/08/2012   Procedure: LAPAROSCOPIC CHOLECYSTECTOMY;  Surgeon: Harl Bowie, MD;  Location: [facility];  Service: General;  Laterality: N/A;  . COLONOSCOPY    . LIVER BIOPSY N/A 05/08/2012   Procedure: LIVER BIOPSY;  Surgeon: Harl Bowie, MD;  Location: [facility];  Service: General;  Laterality: N/A;  . NECK SURGERY     cervical disc with cliip     Current Outpatient Prescriptions  Medication Sig Dispense Refill  . albuterol (PROVENTIL HFA;VENTOLIN HFA) 108 (90 Base) MCG/ACT inhaler Inhale 2 puffs into the lungs every 6 (six) hours as needed for wheezing. 1 Inhaler 2  . apixaban (ELIQUIS) 5 MG TABS tablet Take 1 tablet (5 mg total) by mouth 2 (two) times daily. 180 tablet 3  . bisacodyl (DUCODYL) 5 MG EC tablet Take 5 mg by mouth daily as needed for moderate constipation.    Mar[place] Kitchen diltiazem (CARDIZEM CD) 240 MG 24 hr capsule Take 1 capsule (240 mg total) by mouth daily. 90 capsule 3  . dofetilide (TIKOSYN) 125 MCG capsule Take 1 capsule (125 mcg total) by  mouth 2 (two) times daily. Take along with 279mcg twice a day to total 339mcg 60 capsule 3  . dofetilide (TIKOSYN) 250 MCG capsule Take 1 capsule (250 mcg total) by mouth 2 (two) times daily. Take along with 125mcg twice a day to total 375mg  60 capsule 6  . fluticasone (FLONASE) 50 MCG/ACT nasal spray Place 2 sprays into both nostrils daily. 16 g 5  . furosemide (LASIX) 20 MG tablet Take 1 tablet (20 mg total) by mouth daily. 30 tablet 2  . HYDROcodone-acetaminophen (NORCO) 10-325 MG tablet Take  1 tablet by mouth 3 (three) times daily as needed. 90 tablet 0  . Magnesium 200 MG TABS Take 1 tablet (200 mg total) by mouth 2 (two) times daily. 30 each   . omeprazole (PRILOSEC) 20 MG capsule Take 20 mg by mouth daily as needed (heartburn/indigestion). Reported on 07/21/2015    . Polyethyl Glycol-Propyl Glycol (SYSTANE) 0.4-0.3 % GEL ophthalmic gel Place 1 application into both eyes daily as needed (Use as directed).     . potassium chloride SA (K-DUR,KLOR-CON) 20 MEQ tablet Take 1 tablet (20 mEq total) by mouth 2 (two) times daily. 60 tablet 6  . predniSONE (DELTASONE) 10 MG tablet Take 10 mg by mouth daily with breakfast.   11  . zolpidem (AMBIEN) 10 MG tablet Take 1 tablet (10 mg total) by mouth at bedtime as needed for sleep. 30 tablet 3  . Pirfenidone (ESBRIET) 267 MG CAPS Take by mouth as directed. Take 1 tablet three times a day for one week, then take 2 tablets three times a day for one week and then take 3 tablets three times a day thereafter     No current facility-administered medications for this visit.     Allergies:   Oxycodone; Pravastatin; Simvastatin; Prednisone; and Tegretol [carbamazepine]   Social History:  The patient  reports that he quit smoking about 12 years ago. His smoking use included Cigarettes. He has a 26.00 pack-year smoking history. He has never used smokeless tobacco. He reports that he does not drink alcohol or use drugs.   Family History:  The patient's family history includes COPD in his mother; Cancer in his sister; Congestive Heart Failure in his mother; Hypertension in his mother; Stroke in his father.    ROS:  Please see the history of present illness.   Otherwise, review of systems is positive for Weight gain, leg swelling, shortness of breath at night, wheezing, constipation, easy bruising.   All other systems are reviewed and negative.    PHYSICAL EXAM: VS:  BP 122/80   Pulse 79   Ht 6' 1.25" (1.861 m)   Wt 266 lb 12.8 oz (121 kg)   SpO2 93%    BMI 34.96 kg/m  , BMI Body mass index is 34.96 kg/m. GEN: Well nourished, well developed, in no acute distress  HEENT: normal  Neck: no JVD, carotid bruits, or masses Cardiac: RRR; 2/6 murmur at the base, no rubs, or gallops, 1+ edema  Respiratory:  clear to auscultation bilaterally, normal work of breathing GI: soft, nontender, nondistended, + BS MS: no deformity or atrophy  Skin: warm and dry Neuro:  Strength and sensation are intact Psych: euthymic mood, full affect  EKG:  EKG is ordered today. Personal review of the ekg ordered shows sinus rhythm, voltage criteria for LVH, QTc 467  Recent Labs: 07/21/2015: ALT 25; Hemoglobin 16.1 12/12/2015: BNP 115.1; Platelets 128 03/19/2016: BUN 7; Creatinine, Ser 0.91; Magnesium 2.0; Potassium 4.4; Sodium 137  Lipid Panel     Component Value Date/Time   CHOL 145 06/13/2015 0728   TRIG 101 06/13/2015 0728   HDL 33 (L) 06/13/2015 0728   CHOLHDL 4.4 06/13/2015 0728   VLDL 20 06/13/2015 0728   LDLCALC 92 06/13/2015 0728     Wt Readings from Last 3 Encounters:  04/12/16 266 lb 12.8 oz (121 kg)  03/19/16 264 lb 3.2 oz (119.8 kg)  03/15/16 267 lb (121.1 kg)      Other studies Reviewed: Additional studies/ records that were reviewed today include: TTE 11/23/15  Review of the above records today demonstrates:  -Left ventricle: The cavity size was normal. Wall thickness was   normal. The estimated ejection fraction was 55%. Although no   diagnostic regional wall motion abnormality was identified, this   possibility cannot be completely excluded on the basis of this   study. - Aortic valve: Moderately calcified annulus. Trileaflet; mildly   calcified leaflets. Cusp separation was reduced. Aortic valve was   not completely interrogated. There looks to be mild aortic   stenosis. Valve area by planimetry 1.8 cm^2. - Mitral valve: Calcified annulus. Mildly thickened, mildly   calcified leaflets . There was mild regurgitation. - Left  atrium: The atrium was moderately dilated. - Right atrium: The atrium was mildly dilated. Central venous   pressure (est): 3 mm Hg. - Atrial septum: No defect or patent foramen ovale was identified. - Tricuspid valve: There was trivial regurgitation. - Pulmonary arteries: PA peak pressure: 30 mm Hg (S). - Pericardium, extracardiac: There was no pericardial effusion.   SPECT 02/05/16  There was no ST segment deviation noted during stress.  Defect 1: There is a small defect of mild severity present in the basal inferolateral and mid inferolateral location.  This is a low risk study.   Low risk stress nuclear study with a small fixed inferolateral defect. Ischemia is not seen.  ASSESSMENT AND PLAN:  1.  Persistent atrial fibrillation: On tikosyn and Eliquis. Remains in sinus rhythm today. No changes to his medications.  This patients CHA2DS2-VASc Score and unadjusted Ischemic Stroke Rate (% per year) is equal to 2.2 % stroke rate/year from a score of 2  Above score calculated as 1 point each if present [CHF, HTN, DM, Vascular=MI/PAD/Aortic Plaque, Age if 65-74, or Male] Above score calculated as 2 points each if present [Age > 75, or Stroke/TIA/TE]   2. Hypertension: Well-controlled today. No changes needed today.  3. Lower extremity edema: Possibly related to diastolic dysfunction though was indeterminate on his last echo due to atrial fibrillation. I have asked him to take 40 mg of Lasix for the next 4 days. We'll check his labs early next week.   Current medicines are reviewed at length with the patient today.   The patient does not have concerns regarding his medicines.  The following changes were made today:  none  Labs/ tests ordered today include:  Orders Placed This Encounter  Procedures  . Basic Metabolic Panel (BMET)  . Magnesium  . EKG 12-Lead     Disposition:   FU with Curtis [name] 3 months  Signed, Curtis Kern Meredith Leeds, MD  04/12/2016 3:54 PM     [facility] [address] [place] [address] [phone] (office) [phone] (fax)

## 2016-04-12 NOTE — Telephone Encounter (Signed)
Spoke with the pt and notified ok to continue pred while on esbriet  Nothing further needed

## 2016-04-12 NOTE — Patient Instructions (Addendum)
Medication Instructions:    Your physician has recommended you make the following change in your medication:  1) INCREASE Lasix to 40 mg daily through next Tuesday, 4/10. Return to normal dosing of 20 mg daily on 4/11.  --- If you need a refill on your cardiac medications before your next appointment, please call your pharmacy. ---  Labwork:  Next week: BMET and Magnesium level  (you may have this done in [place])  Testing/Procedures:  None ordered  Follow-Up:  Your physician recommends that you schedule a follow-up appointment in: 3 months with Dr. Curt Bears.  Thank you for choosing [facility]!!   Trinidad Curet, RN [phone]

## 2016-04-15 NOTE — Telephone Encounter (Signed)
Curtis [name] any update on the forms that were sent in to [facility]?  thanks

## 2016-04-16 ENCOUNTER — Telehealth: Payer: Self-pay | Admitting: Family Medicine

## 2016-04-16 DIAGNOSIS — I481 Persistent atrial fibrillation: Secondary | ICD-10-CM | POA: Diagnosis not present

## 2016-04-16 NOTE — Telephone Encounter (Signed)
Prior Authorization was approved for Oxygen Concentrator and Portable Gas Oxygen. Start date 02/05/16-03/06/17 ([place] at [place]. Authorization [id number]

## 2016-04-17 LAB — BASIC METABOLIC PANEL
BUN / CREAT RATIO: 10 (ref 10–24)
BUN: 11 mg/dL (ref 8–27)
CO2: 28 mmol/L (ref 18–29)
CREATININE: 1.09 mg/dL (ref 0.76–1.27)
Calcium: 9.4 mg/dL (ref 8.6–10.2)
Chloride: 98 mmol/L (ref 96–106)
GFR calc Af Amer: 78 mL/min/{1.73_m2} (ref >59)
GFR, EST NON AFRICAN AMERICAN: 67 mL/min/{1.73_m2} (ref >59)
Glucose: 137 mg/dL — ABNORMAL HIGH (ref 65–99)
POTASSIUM: 4.6 mmol/L (ref 3.5–5.2)
SODIUM: 142 mmol/L (ref 134–144)

## 2016-04-17 LAB — MAGNESIUM: Magnesium: 2 mg/dL (ref 1.6–2.3)

## 2016-04-17 NOTE — Telephone Encounter (Signed)
[place] at [phone] to check on status as I have not received anything. Call went to VM as the office was in a meeting. Will call back.

## 2016-04-19 DIAGNOSIS — C44519 Basal cell carcinoma of skin of other part of trunk: Secondary | ICD-10-CM | POA: Diagnosis not present

## 2016-04-19 DIAGNOSIS — D485 Neoplasm of uncertain behavior of skin: Secondary | ICD-10-CM | POA: Diagnosis not present

## 2016-04-19 DIAGNOSIS — L409 Psoriasis, unspecified: Secondary | ICD-10-CM | POA: Diagnosis not present

## 2016-04-19 DIAGNOSIS — C44629 Squamous cell carcinoma of skin of left upper limb, including shoulder: Secondary | ICD-10-CM | POA: Diagnosis not present

## 2016-04-21 ENCOUNTER — Emergency Department (HOSPITAL_COMMUNITY)
Admission: EM | Admit: 2016-04-21 | Discharge: 2016-04-21 | Discharge status: Against Medical Advice | Payer: PPO | Attending: Emergency Medicine | Admitting: Emergency Medicine

## 2016-04-21 ENCOUNTER — Emergency Department (HOSPITAL_COMMUNITY): Payer: PPO

## 2016-04-21 ENCOUNTER — Encounter (HOSPITAL_COMMUNITY): Payer: Self-pay | Admitting: *Deleted

## 2016-04-21 DIAGNOSIS — R531 Weakness: Secondary | ICD-10-CM | POA: Insufficient documentation

## 2016-04-21 DIAGNOSIS — R5383 Other fatigue: Secondary | ICD-10-CM | POA: Diagnosis not present

## 2016-04-21 DIAGNOSIS — Z87891 Personal history of nicotine dependence: Secondary | ICD-10-CM | POA: Insufficient documentation

## 2016-04-21 DIAGNOSIS — R079 Chest pain, unspecified: Secondary | ICD-10-CM | POA: Diagnosis not present

## 2016-04-21 DIAGNOSIS — R110 Nausea: Secondary | ICD-10-CM

## 2016-04-21 DIAGNOSIS — R002 Palpitations: Secondary | ICD-10-CM | POA: Diagnosis not present

## 2016-04-21 DIAGNOSIS — I10 Essential (primary) hypertension: Secondary | ICD-10-CM | POA: Diagnosis not present

## 2016-04-21 DIAGNOSIS — J45909 Unspecified asthma, uncomplicated: Secondary | ICD-10-CM | POA: Insufficient documentation

## 2016-04-21 DIAGNOSIS — R05 Cough: Secondary | ICD-10-CM | POA: Insufficient documentation

## 2016-04-21 DIAGNOSIS — R0602 Shortness of breath: Secondary | ICD-10-CM | POA: Insufficient documentation

## 2016-04-21 DIAGNOSIS — R0789 Other chest pain: Secondary | ICD-10-CM | POA: Diagnosis not present

## 2016-04-21 HISTORY — DX: Unspecified atrial fibrillation: I48.91

## 2016-04-21 LAB — BASIC METABOLIC PANEL
ANION GAP: 6 (ref 5–15)
BUN: 8 mg/dL (ref 6–20)
CO2: 29 mmol/L (ref 22–32)
Calcium: 9 mg/dL (ref 8.9–10.3)
Chloride: 103 mmol/L (ref 101–111)
Creatinine, Ser: 0.94 mg/dL (ref 0.61–1.24)
GFR calc Af Amer: >60 mL/min (ref >60)
GFR calc non Af Amer: >60 mL/min (ref >60)
GLUCOSE: 96 mg/dL (ref 65–99)
POTASSIUM: 3.7 mmol/L (ref 3.5–5.1)
Sodium: 138 mmol/L (ref 135–145)

## 2016-04-21 LAB — CBC WITH DIFFERENTIAL/PLATELET
BASOS ABS: 0 10*3/uL (ref 0.0–0.1)
Basophils Relative: 0 %
Eosinophils Absolute: 0.4 10*3/uL (ref 0.0–0.7)
Eosinophils Relative: 4 %
HEMATOCRIT: 44.7 % (ref 39.0–52.0)
Hemoglobin: 14.8 g/dL (ref 13.0–17.0)
LYMPHS PCT: 21 %
Lymphs Abs: 2.2 10*3/uL (ref 0.7–4.0)
MCH: 31.8 pg (ref 26.0–34.0)
MCHC: 33.1 g/dL (ref 30.0–36.0)
MCV: 95.9 fL (ref 78.0–100.0)
Monocytes Absolute: 1.2 10*3/uL — ABNORMAL HIGH (ref 0.1–1.0)
Monocytes Relative: 11 %
NEUTROS ABS: 6.7 10*3/uL (ref 1.7–7.7)
Neutrophils Relative %: 64 %
Platelets: 127 10*3/uL — ABNORMAL LOW (ref 150–400)
RBC: 4.66 MIL/uL (ref 4.22–5.81)
RDW: 12.7 % (ref 11.5–15.5)
WBC: 10.5 10*3/uL (ref 4.0–10.5)

## 2016-04-21 LAB — BRAIN NATRIURETIC PEPTIDE: B Natriuretic Peptide: 41 pg/mL (ref 0.0–100.0)

## 2016-04-21 LAB — TROPONIN I: Troponin I: <0.03 ng/mL (ref <0.03)

## 2016-04-21 MED ORDER — ONDANSETRON HCL 4 MG/2ML IJ SOLN
4.0000 mg | Freq: Once | INTRAMUSCULAR | Status: AC
  Administered 2016-04-21: 4 mg via INTRAVENOUS
  Filled 2016-04-21: qty 2

## 2016-04-21 NOTE — ED Provider Notes (Signed)
[facility] DEPT Provider Note   CSN: [id number] Arrival date & time: 04/21/16  0135     History   Chief Complaint Chief Complaint  Patient presents with  . Chest Pain    HPI Curtis [name] is a 71 y.o. male.  Patient presents with 2 days of feeling poorly including palpitations, shortness of breath, nausea, intermittent chest pain. He is concerned he is in atrial fibrillation because he feels palpitations. He states he has not felt well since he increased the dose of his pulmonary fibrosis medication (pirfenidone)  several days ago. has had intermittent left-sided chest pain lasting only a few seconds at a time. Endorses shortness of breath worse from his baseline that is worse with exertion associated with nausea. No vomiting or diarrhea. Patient on Eliquis for history of atrial fibrillation. Denies any stents in his heart. Had a reassuring stress test in January 2018. States he also has increased leg swelling and generalized weakness and nausea. Denies sick contacts. Denies fever. Denies abdominal pain but feels "full".  Last BM today.   The history is provided by the patient.  Chest Pain   Associated symptoms include cough, shortness of breath and weakness. Pertinent negatives include no abdominal pain, no dizziness, no fever, no headaches, no nausea and no vomiting.    Past Medical History:  Diagnosis Date  . Arthritis   . Asthma   . Atrial fibrillation ([place])   . Cervical spondylosis   . Chest pain    Emergency room October 24, 2011, no MI  //   Nuclear, October, 2013, adenosine, EF 61%, no scar or ischemia  . Dyslipidemia    Statin intolerant  . GERD (gastroesophageal reflux disease)   . Glaucoma   . Hearing loss   . Hypertension   . Idiopathic pulmonary fibrosis ([place])   . Interstitial pulmonary fibrosis ([place]) 2012  . Left shoulder pain 2011  . Psoriasis   . Statin intolerance     Patient Active Problem List   Diagnosis Date Noted  . H/O asbestos  exposure 03/15/2016  . Persistent atrial fibrillation ([place]) 02/19/2016  . Atrial fibrillation, persistent ([place]) 02/19/2016  . IPF (idiopathic pulmonary fibrosis) ([place]) 02/09/2016  . Incidental lung nodule, greater than or equal to 37mm 02/09/2016  . Bell's palsy 08/03/2015  . Insomnia 06/08/2015  . Right trigeminal neuralgia 04/01/2014  . Chronic back pain 03/01/2014  . Elevated transaminase level 06/25/2013  . Obesity (BMI 30-39.9) 09/09/2012  . Chronic neck pain 09/09/2012  . Symptomatic cholelithiasis 04/14/2012  . Chest pain   . Statin intolerance   . Dyslipidemia   . GERD (gastroesophageal reflux disease)   . Hypertension   . INTERSTITIAL LUNG DISEASE 03/16/2010  . PSORIASIS 03/16/2010    Past Surgical History:  Procedure Laterality Date  . BRAIN SURGERY    . CARDIOVERSION N/A 12/13/2015   Procedure: CARDIOVERSION;  Surgeon: Josue Hector, MD;  Location: [facility];  Service: Cardiovascular;  Laterality: N/A;  . CATARACT EXTRACTION W/PHACO Right 06/11/2012   Procedure: CATARACT EXTRACTION PHACO AND INTRAOCULAR LENS PLACEMENT ([place]);  Surgeon: Tonny Branch, MD;  Location: [facility];  Service: Ophthalmology;  Laterality: Right;  CDE:  14.43  . CATARACT EXTRACTION W/PHACO Left 06/29/2012   Procedure: CATARACT EXTRACTION PHACO AND INTRAOCULAR LENS PLACEMENT ([facility]);  Surgeon: Tonny Branch, MD;  Location: [facility];  Service: Ophthalmology;  Laterality: Left;  CDE:14.11  . CHOLECYSTECTOMY  05/08/2012  . CHOLECYSTECTOMY N/A 05/08/2012   Procedure: LAPAROSCOPIC CHOLECYSTECTOMY;  Surgeon: Harl Bowie, MD;  Location: [facility];  Service: General;  Laterality: N/A;  . COLONOSCOPY    . LIVER BIOPSY N/A 05/08/2012   Procedure: LIVER BIOPSY;  Surgeon: Harl Bowie, MD;  Location: [facility];  Service: General;  Laterality: N/A;  . NECK SURGERY     cervical disc with cliip       Home Medications    Prior to Admission medications   Medication Sig Start Date End Date Taking? Authorizing Provider    albuterol (PROVENTIL HFA;VENTOLIN HFA) 108 (90 Base) MCG/ACT inhaler Inhale 2 puffs into the lungs every 6 (six) hours as needed for wheezing. 04/08/16   Kathyrn Drown, MD  apixaban (ELIQUIS) 5 MG TABS tablet Take 1 tablet (5 mg total) by mouth 2 (two) times daily. 03/15/16   Sherran Needs, NP  bisacodyl (DUCODYL) 5 MG EC tablet Take 5 mg by mouth daily as needed for moderate constipation.    Historical Provider, MD  diltiazem (CARDIZEM CD) 240 MG 24 hr capsule Take 1 capsule (240 mg total) by mouth daily. 12/07/15 04/12/16  Josue Hector, MD  dofetilide (TIKOSYN) 125 MCG capsule Take 1 capsule (125 mcg total) by mouth 2 (two) times daily. Take along with 243mcg twice a day to total 381mcg 03/15/16   Sherran Needs, NP  dofetilide (TIKOSYN) 250 MCG capsule Take 1 capsule (250 mcg total) by mouth 2 (two) times daily. Take along with 177mcg twice a day to total 375mg  03/15/16   Sherran Needs, NP  fluticasone [facility]) 50 MCG/ACT nasal spray Place 2 sprays into both nostrils daily. 01/02/16   Kathyrn Drown, MD  furosemide (LASIX) 20 MG tablet Take 1 tablet (20 mg total) by mouth daily. 02/29/16   Sherran Needs, NP  HYDROcodone-acetaminophen [facility]) 10-325 MG tablet Take 1 tablet by mouth 3 (three) times daily as needed. 01/02/16   Kathyrn Drown, MD  Magnesium 200 MG TABS Take 1 tablet (200 mg total) by mouth 2 (two) times daily. 03/15/16   Sherran Needs, NP  omeprazole (PRILOSEC) 20 MG capsule Take 20 mg by mouth daily as needed (heartburn/indigestion). Reported on 07/21/2015    Historical Provider, MD  Pirfenidone (ESBRIET) 267 MG CAPS Take by mouth as directed. Take 1 tablet three times a day for one week, then take 2 tablets three times a day for one week and then take 3 tablets three times a day thereafter    Historical Provider, MD  Polyethyl Glycol-Propyl Glycol (SYSTANE) 0.4-0.3 % GEL ophthalmic gel Place 1 application into both eyes daily as needed (Use as directed).     Historical Provider, MD   potassium chloride SA (K-DUR,KLOR-CON) 20 MEQ tablet Take 1 tablet (20 mEq total) by mouth 2 (two) times daily. 03/19/16   Sherran Needs, NP  predniSONE (DELTASONE) 10 MG tablet Take 10 mg by mouth daily with breakfast.  01/07/16   Historical Provider, MD  zolpidem (AMBIEN) 10 MG tablet Take 1 tablet (10 mg total) by mouth at bedtime as needed for sleep. 04/05/16 05/05/16  Mikey Kirschner, MD    Family History Family History  Problem Relation Age of Onset  . Hypertension Mother   . Congestive Heart Failure Mother   . COPD Mother   . Stroke Father   . Cancer Sister     breast    Social History Social History  Substance Use Topics  . Smoking status: Former Smoker    Packs/day: 0.50    Years: 52.00    Types:  Cigarettes    Quit date: 09/08/2003  . Smokeless tobacco: Never Used  . Alcohol use No     Comment: quit drinking 2004     Allergies   Oxycodone; Pravastatin; Simvastatin; Prednisone; and Tegretol [carbamazepine]   Review of Systems Review of Systems  Constitutional: Positive for activity change, appetite change and fatigue. Negative for fever.  HENT: Negative for congestion.   Respiratory: Positive for cough, chest tightness and shortness of breath.   Cardiovascular: Positive for chest pain.  Gastrointestinal: Negative for abdominal pain, nausea and vomiting.  Genitourinary: Negative for dysuria, hematuria and testicular pain.  Musculoskeletal: Positive for arthralgias.  Neurological: Positive for weakness. Negative for dizziness and headaches.   A complete 10 system review of systems was obtained and all systems are negative except as noted in the HPI and PMH.    Physical Exam Updated Vital Signs BP 128/83   Pulse 84   Temp 98.1 F (36.7 C) (Oral)   Resp 18   Ht 6' 1.25" (1.861 m)   Wt 263 lb (119.3 kg)   SpO2 91%   BMI 34.46 kg/m   Physical Exam  Constitutional: He is oriented to person, place, and time. He appears well-developed and well-nourished.  No distress.  HENT:  Head: Normocephalic and atraumatic.  Mouth/Throat: Oropharynx is clear and moist. No oropharyngeal exudate.  Eyes: Conjunctivae and EOM are normal. Pupils are equal, round, and reactive to light.  Neck: Normal range of motion. Neck supple.  No meningismus.  Cardiovascular: Normal rate, regular rhythm, normal heart sounds and intact distal pulses.   No murmur heard. Pulmonary/Chest: Effort normal and breath sounds normal. No respiratory distress.  velcro crackles bilaterally  Abdominal: Soft. There is no tenderness. There is no rebound and no guarding.  Musculoskeletal: Normal range of motion. He exhibits edema. He exhibits no tenderness.  Trace pretibial edema  Neurological: He is alert and oriented to person, place, and time. No cranial nerve deficit. He exhibits normal muscle tone. Coordination normal.  No ataxia on finger to nose bilaterally. No pronator drift. 5/5 strength throughout. CN 2-12 intact.Equal grip strength. Sensation intact.   Skin: Skin is warm.  Psychiatric: He has a normal mood and affect. His behavior is normal.  Nursing note and vitals reviewed.    ED Treatments / Results  Labs (all labs ordered are listed, but only abnormal results are displayed) Labs Reviewed  CBC WITH DIFFERENTIAL/PLATELET - Abnormal; Notable for the following:       Result Value   Platelets 127 (*)    Monocytes Absolute 1.2 (*)    All other components within normal limits  BASIC METABOLIC PANEL  TROPONIN I  BRAIN NATRIURETIC PEPTIDE    EKG  EKG Interpretation  Date/Time:  Sunday April 21 2016 01:43:50 EDT Ventricular Rate:  91 PR Interval:    QRS Duration: 115 QT Interval:  389 QTC Calculation: 479 R Axis:   30 Text Interpretation:  Sinus rhythm Atrial premature complexes Nonspecific intraventricular conduction delay Borderline low voltage, extremity leads Minimal ST depression, lateral leads Baseline wander in lead(s) V3 No significant change was found  Confirmed by [place] [phone]) on 04/21/2016 1:56:49 AM       Radiology Dg Chest 2 View  Result Date: 04/21/2016 CLINICAL DATA:  Acute onset left-sided chest pain, shortness of breath and nausea. Initial encounter. EXAM: CHEST  2 VIEW COMPARISON:  Chest radiograph from 10/24/2011 FINDINGS: The lungs are well-aerated. Bilateral peripheral fibrotic change is noted, with patchy bilateral interstitial  opacities, concerning for interstitial lung disease. There is no evidence of pleural effusion or pneumothorax. The heart is normal in size; the mediastinal contour is within normal limits. No acute osseous abnormalities are seen. Clips are noted within the right upper quadrant, reflecting prior cholecystectomy. IMPRESSION: Bilateral peripheral fibrotic change, with patchy bilateral interstitial opacities, concerning for interstitial lung disease. High-resolution CT is recommended for further evaluation. Electronically Signed   By: [name] M.D.   On: 04/21/2016 03:35    Procedures Procedures (including critical care time)  Medications Ordered in ED Medications  ondansetron (ZOFRAN) injection 4 mg (4 mg Intravenous Given 04/21/16 0234)     Initial Impression / Assessment and Plan / ED Course  I have reviewed the triage vital signs and the nursing notes.  Pertinent labs & imaging results that were available during my care of the patient were reviewed by me and considered in my medical decision making (see chart for details).     Patient with multiple complaints including weakness, nausea, palpitations, and intermittent chest pain.  EKG unchanged. Chest pain description atypical for ACS. Reassuring stress test in January 2018.   States compliance with eliquis.  Doubt PE. Doubt ACS.  Not in A fib but does have some PACs.  Labs reassuring.  CXR presumably stable.  Recent CT chest done at [facility].   States his breathing is at baseline, if not better. Ambulatory without desaturation  on home O2.  Admission recommended for chest pain rule out as well as further evaluation of nausea and generalized feeling of unwell. He believes his pulmonary fibrosis medication is responsible. Side effects of pirfenidone include nausea, abdominal pain, diarrhea, fatigue, so it's very possible this could be causing his symptoms. However, other life threatening pathologies have not been ruled out.   Patient declines second troponin, UA, further workup of abdominal discomfort and nausea.   Patient wishes to go home and will leave against medical advice.  He understands that life threatening conditions have not been ruled out and appears to have capacity to make decisions.  Patient requesting to leave against medical advice. He is alert and oriented x3 and appears to have decision making capacity. Denies suicidal or homicidal ideation.  Discussed that admission and further testing is recommended and emergent medical conditions have not been ruled out. Discussed that leaving against medical advice may result in clinical deterioration and possible death.    Final Clinical Impressions(s) / ED Diagnoses   Final diagnoses:  Atypical chest pain  Nausea    New Prescriptions New Prescriptions   No medications on file     Ezequiel Essex, MD 04/21/16 [phone]

## 2016-04-21 NOTE — ED Triage Notes (Signed)
Pt c/o left side chest pain, sob, nausea that started yesterday, pt reports that he feels like "my heart is out of rhythm again" has hx of a-fib,

## 2016-04-21 NOTE — ED Notes (Signed)
Pt walked around the nurses station and back to his room. Pt started off at 97% on 2L (walking rather fast). Pt dropped to 91% as he got about 24ft to his room (was walking faster)

## 2016-04-22 ENCOUNTER — Ambulatory Visit (HOSPITAL_COMMUNITY)
Admission: RE | Admit: 2016-04-22 | Discharge: 2016-04-22 | Disposition: A | Discharge status: Home / Self Care | Payer: PPO | Source: Ambulatory Visit | Attending: Family Medicine | Admitting: Family Medicine

## 2016-04-22 ENCOUNTER — Ambulatory Visit (INDEPENDENT_AMBULATORY_CARE_PROVIDER_SITE_OTHER): Payer: PPO | Admitting: Family Medicine

## 2016-04-22 ENCOUNTER — Encounter: Payer: Self-pay | Admitting: Family Medicine

## 2016-04-22 ENCOUNTER — Other Ambulatory Visit (HOSPITAL_COMMUNITY)
Admission: RE | Admit: 2016-04-22 | Discharge: 2016-04-22 | Disposition: A | Discharge status: Home / Self Care | Payer: PPO | Source: Ambulatory Visit | Attending: Family Medicine | Admitting: Family Medicine

## 2016-04-22 VITALS — BP 132/82 | Ht 73.25 in | Wt 260.2 lb

## 2016-04-22 DIAGNOSIS — R1030 Lower abdominal pain, unspecified: Secondary | ICD-10-CM | POA: Insufficient documentation

## 2016-04-22 DIAGNOSIS — Z9049 Acquired absence of other specified parts of digestive tract: Secondary | ICD-10-CM | POA: Diagnosis not present

## 2016-04-22 DIAGNOSIS — K7460 Unspecified cirrhosis of liver: Secondary | ICD-10-CM | POA: Diagnosis not present

## 2016-04-22 DIAGNOSIS — M545 Low back pain: Secondary | ICD-10-CM

## 2016-04-22 DIAGNOSIS — J984 Other disorders of lung: Secondary | ICD-10-CM | POA: Insufficient documentation

## 2016-04-22 DIAGNOSIS — N200 Calculus of kidney: Secondary | ICD-10-CM | POA: Diagnosis not present

## 2016-04-22 DIAGNOSIS — G4700 Insomnia, unspecified: Secondary | ICD-10-CM

## 2016-04-22 DIAGNOSIS — K5730 Diverticulosis of large intestine without perforation or abscess without bleeding: Secondary | ICD-10-CM | POA: Insufficient documentation

## 2016-04-22 DIAGNOSIS — G8929 Other chronic pain: Secondary | ICD-10-CM

## 2016-04-22 DIAGNOSIS — R109 Unspecified abdominal pain: Secondary | ICD-10-CM | POA: Diagnosis not present

## 2016-04-22 LAB — CBC WITH DIFFERENTIAL/PLATELET
Basophils Absolute: 0 10*3/uL (ref 0.0–0.1)
Basophils Relative: 0 %
Eosinophils Absolute: 0.2 10*3/uL (ref 0.0–0.7)
Eosinophils Relative: 1 %
HCT: 46.5 % (ref 39.0–52.0)
Hemoglobin: 15.6 g/dL (ref 13.0–17.0)
LYMPHS ABS: 1.4 10*3/uL (ref 0.7–4.0)
LYMPHS PCT: 11 %
MCH: 32.5 pg (ref 26.0–34.0)
MCHC: 33.5 g/dL (ref 30.0–36.0)
MCV: 96.9 fL (ref 78.0–100.0)
MONOS PCT: 7 %
Monocytes Absolute: 0.8 10*3/uL (ref 0.1–1.0)
Neutro Abs: 10.4 10*3/uL — ABNORMAL HIGH (ref 1.7–7.7)
Neutrophils Relative %: 81 %
PLATELETS: 119 10*3/uL — AB (ref 150–400)
RBC: 4.8 MIL/uL (ref 4.22–5.81)
RDW: 12.6 % (ref 11.5–15.5)
WBC: 12.8 10*3/uL — AB (ref 4.0–10.5)

## 2016-04-22 LAB — HEPATIC FUNCTION PANEL
ALBUMIN: 3.7 g/dL (ref 3.5–5.0)
ALT: 22 U/L (ref 17–63)
AST: 29 U/L (ref 15–41)
Alkaline Phosphatase: 58 U/L (ref 38–126)
BILIRUBIN TOTAL: 3.4 mg/dL — AB (ref 0.3–1.2)
Bilirubin, Direct: 0.5 mg/dL (ref 0.1–0.5)
Indirect Bilirubin: 2.9 mg/dL — ABNORMAL HIGH (ref 0.3–0.9)
TOTAL PROTEIN: 7.5 g/dL (ref 6.5–8.1)

## 2016-04-22 LAB — LIPASE, BLOOD: Lipase: 17 U/L (ref 11–51)

## 2016-04-22 MED ORDER — METRONIDAZOLE 500 MG PO TABS: 500.0000 mg | ORAL_TABLET | Freq: Three times a day (TID) | ORAL | 0 refills | Status: DC

## 2016-04-22 MED ORDER — HYDROCODONE-ACETAMINOPHEN 10-325 MG PO TABS: 1.0000 | ORAL_TABLET | Freq: Three times a day (TID) | ORAL | 0 refills | Status: DC | PRN

## 2016-04-22 MED ORDER — BARIUM SULFATE 2.1 % PO SUSP
ORAL | Status: AC
  Filled 2016-04-22: qty 2

## 2016-04-22 MED ORDER — CIPROFLOXACIN HCL 500 MG PO TABS: 500.0000 mg | ORAL_TABLET | Freq: Two times a day (BID) | ORAL | 0 refills | Status: DC

## 2016-04-22 MED ORDER — IOPAMIDOL (ISOVUE-300) INJECTION 61%
100.0000 mL | Freq: Once | INTRAVENOUS | Status: AC | PRN
  Administered 2016-04-22: 100 mL via INTRAVENOUS

## 2016-04-22 NOTE — Addendum Note (Signed)
Addended by: Dairl Ponder on: 04/22/2016 02:50 PM   Modules accepted: Orders

## 2016-04-22 NOTE — Progress Notes (Signed)
   Subjective:    Patient ID: Curtis [name], male    DOB: July 23, 1945, 71 y.o.   MRN: [id number]  HPI Patient arrives with c/o left shoulder pain. Patient also here for a follow up from ER for nausea and constipation.  this patient initially went to the ER because he though was he was in atrial fib the dtermined hat he was not in atrialfibrilatin ut then he states since been in the ER he is noticed in increase in ower abdominal discomfort. Patient denies vomiting relates nausea states he has had some constipation denies bleeding in the stool denies flank pain denies hematuria no fever chills or sweats. Denies chest pressure currently he had a couple short chest pains yes male the last a few seconds at a time troponin was negative Review of Systems  Constitutional: Negative for fatigue and fever.  HENT: Negative for congestion.   Respiratory: Positive for shortness of breath.   Cardiovascular: Positive for chest pain. Negative for palpitations.  Gastrointestinal: Positive for abdominal pain and nausea.       Objective:   Physical Exam  Constitutional: He appears well-nourished. No distress.  Cardiovascular: Normal rate, regular rhythm and normal heart sounds.   No murmur heard. Pulmonary/Chest: Effort normal and breath sounds normal. No respiratory distress.  Abdominal: He exhibits no distension. There is tenderness. There is no rebound and no guarding.  Musculoskeletal: He exhibits no edema.  Lymphadenopathy:    He has no cervical adenopathy.  Neurological: He is alert.  Psychiatric: His behavior is normal.  Vitals reviewed.  Patient does have significant tenderness in the lower abdomen. No guarding or rebound.  Patient also raises concern about left shoulder pain intermittent in the upper shoulder region that has been going on for weeks he has been counseled previously regarding stretches and massage to that area  He has chronic back pain is requested his prescriptions 3  prescriptions were given today drug registry was checked patient denies abusing it  Patient brings up insomnia as a by the way concern he states that the most recent Ambien he got was not the type usually gets because of a different drug manufacturer after a fairly significant discussion I told him that if he would like to have another prescription for this he can get it at different drugstore but may not be covered by his insurance    25 minutes was spent with the patient. Greater than half the time was spent in discussion and answering questions and counseling regarding the issues that the patient came in for today.  Assessment & Plan:  Significant lower abdominal pain and acute onset over the past couple days denies high fever chills sweats I believe this patient does benefit from having a follow-up lab work as well as a CT scan lower abdomen to rule out pathology it could be all be related to constipation is hard to say stat testing is necessary  Chronic pain 3 prescriptions given as stated above patient does not abuse his medicine drug registry was checked he does help him function better  Insomnia use Ambien for now call us if any ongoing troubles  Regular follow-ups for health checks recommended

## 2016-04-24 NOTE — Telephone Encounter (Signed)
Called [facility] and spoke with Michiel Cowboy  She states pt was approved on 04/10/16 and pt is already aware of this  His med was shipped to him on 04/12/16 (starter dose) They are now needing a prescription for the maintenance dose faxed to them at [phone]

## 2016-04-25 NOTE — Telephone Encounter (Signed)
Spoke with the pt and notified of recs per MR  He states he already has appt with TP tomorrow and has some things we would like to talk about so wants to keep this appt

## 2016-04-25 NOTE — Telephone Encounter (Signed)
He neds t come in next 3 weeks to 4 weeks to see how tolerance is going and LFT check; me o rAPP is fine  Dr. Brand Males, M.D., [facility].C.P Pulmonary and Critical Care Medicine Staff Physician [facility] Pager: [phone], If no answer or between  15:00h - 7:00h: call [phone]  04/25/2016 9:07 AM

## 2016-04-26 ENCOUNTER — Telehealth: Payer: Self-pay | Admitting: Internal Medicine

## 2016-04-26 ENCOUNTER — Ambulatory Visit (INDEPENDENT_AMBULATORY_CARE_PROVIDER_SITE_OTHER): Payer: PPO | Admitting: Adult Health

## 2016-04-26 ENCOUNTER — Telehealth: Payer: Self-pay | Admitting: Adult Health

## 2016-04-26 ENCOUNTER — Encounter: Payer: Self-pay | Admitting: Adult Health

## 2016-04-26 DIAGNOSIS — J9610 Chronic respiratory failure, unspecified whether with hypoxia or hypercapnia: Secondary | ICD-10-CM | POA: Insufficient documentation

## 2016-04-26 DIAGNOSIS — J84112 Idiopathic pulmonary fibrosis: Secondary | ICD-10-CM

## 2016-04-26 DIAGNOSIS — J9611 Chronic respiratory failure with hypoxia: Secondary | ICD-10-CM

## 2016-04-26 NOTE — Assessment & Plan Note (Signed)
?   Secondary to chronic liver dz as evidenced on CT abd/pelvis and worse on Esbriet  Cont follow up with PCP for wokrup and may need GI referral .  For now will not be able to proceed with Esbriet . Will see back in office and discuss future tx options in 4 weeks for IPF.  Will call pt and discuss this in detail.

## 2016-04-26 NOTE — Patient Instructions (Addendum)
Finish Flagyl and Cipro as directed .  Do not restart Esbriet.  Keep follow up with PCP and Abdominal Ultrasound .   Increase Prilosec 20mg  Twice daily  .  Continue on Oxygen 2l/m with activity .  Follow up Dr. Chase Caller in 4 weeks with labs.  Please contact office for sooner follow up if symptoms do not improve or worsen or seek emergency care

## 2016-04-26 NOTE — Progress Notes (Signed)
'@Patient'  ID: Curtis [name], male    DOB: [birth year], 71 y.o.   MRN: [id number]  Chief Complaint  Patient presents with  . Follow-up    IPF    Referring provider: Kathyrn Drown, MD  HPI: 71 yo male former smoker  of probable idiopathic IPF  Follows at [facility] w/ Dr. Randol Kern.  Remote asbestos exposure in [employer] .   TEST  2013 FVC and DLCO 60% each. In January 2017 his FVC is 2.2 L/42% predicted with FEV1 of 1.6 L/42% predicted, TLC of 47% predicted and DLCO 14.2/55% predicted suggesting overall slow steady progression.  [place] notes :  A. Possible UIP pattern by current ATS criteria -> changed to progression and definite UIP Oct 2017 B. Significant asbestos exposure in [employer]  C. 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 Subjective improvement with Advair; no air trapping seen on CT  E. No symptoms suggestive of CTD; labs show aldolase of 10.2 and weak positive MI-2.  F. TPMT level is 8.7 (low), indicating he may not metabolize azathioprine effectively.  2. Exertional hypoxia, improved.  04/26/2016 Follow up: IPF /ER follow up  Pt returns for follow up from recent ER visit . Pt says he recently has been seen for acute abdominal pain and chest pain and nausea. ER workup was unrevealing for cardiac source . CXR showed chronic IPF . No acute process. Troponin /BNP nml . AST /ALT were nnl . T. Bili was elevated at 3.4.  Patient says about a week ago that he developed some significant nausea, lower abdominal pain and chest tightness. He had no vomiting or diarrhea. Patient recently started on Esbriet . He initially started on 1 capsule 3 times a day. On the second week he began 2 capsules 3 times a day. On the day of the increased dose titration patient abdominal symptoms began. Patient has held aspirin. Patient was seen by his primary care physician. He was felt to have possible diverticulitis. And started on Cipro and Flagyl.. Patient is on chronic narcotics and has constipation. CT  abdomen and pelvis was done on 04/23/2015 that showed sigmoid diverticulosis without diverticulitis. Cirrhotic appearing liver . No mass noted.  It appears a ABD [place] is pending from PCP.   Patient presents today and says that he is feeling much better. Nausea has resolved. Stomach is feeling better and patient's appetite has returned.  Patient is very concerned that he has held his Esbriet. He felt that the short week that he was on this. His breathing improved substantially. He had less shortness of breath and improved exercise tolerance.     Allergies  Allergen Reactions  . Acyclovir And Related   . Oxycodone Itching  . Pravastatin Other (See Comments)    Chills    . Simvastatin Other (See Comments)    Chills    . Prednisone Anxiety    Irritation  . Tegretol [Carbamazepine] Other (See Comments)    dizziness    Immunization History  Administered Date(s) Administered  . Influenza Split 10/04/2010, 09/28/2011  . Influenza,inj,Quad PF,36+ Mos 09/28/2013, 10/17/2014, 10/23/2015  . Influenza-Unspecified 09/08/2011  . Pneumococcal Conjugate-13 10/03/2009, 09/28/2013  . Pneumococcal Polysaccharide-23 01/08/2008, 10/03/2009  . Td 10/07/2009    Past Medical History:  Diagnosis Date  . Arthritis   . Asthma   . Atrial fibrillation ([place])   . Cervical spondylosis   . Chest pain    Emergency room October 24, 2011, no MI  //   Nuclear, October, 2013, adenosine, EF  61%, no scar or ischemia  . Dyslipidemia    Statin intolerant  . GERD (gastroesophageal reflux disease)   . Glaucoma   . Hearing loss   . Hypertension   . Idiopathic pulmonary fibrosis ([place])   . Interstitial pulmonary fibrosis ([place]) 2012  . Left shoulder pain 2011  . Psoriasis   . Statin intolerance     Tobacco History: History  Smoking Status  . Former Smoker  . Packs/day: 0.50  . Years: 52.00  . Types: Cigarettes  . Quit date: 09/08/2003  Smokeless Tobacco  . Never Used   Counseling given: Not  Answered   Outpatient Encounter Prescriptions as of 04/26/2016  Medication Sig  . albuterol (PROVENTIL HFA;VENTOLIN HFA) 108 (90 Base) MCG/ACT inhaler Inhale 2 puffs into the lungs every 6 (six) hours as needed for wheezing.  Marland Kitchen apixaban (ELIQUIS) 5 MG TABS tablet Take 1 tablet (5 mg total) by mouth 2 (two) times daily.  . bisacodyl (DUCODYL) 5 MG EC tablet Take 5 mg by mouth daily as needed for moderate constipation.  . ciprofloxacin (CIPRO) 500 MG tablet Take 1 tablet (500 mg total) by mouth 2 (two) times daily.  Marland Kitchen diltiazem (CARDIZEM CD) 240 MG 24 hr capsule Take 1 capsule (240 mg total) by mouth daily.  Marland Kitchen dofetilide (TIKOSYN) 125 MCG capsule Take 1 capsule (125 mcg total) by mouth 2 (two) times daily. Take along with 262mg twice a day to total 3783m  . dofetilide (TIKOSYN) 250 MCG capsule Take 1 capsule (250 mcg total) by mouth 2 (two) times daily. Take along with 12538mtwice a day to total 375m44m fluticasone (FLONASE) 50 MCG/ACT nasal spray Place 2 sprays into both nostrils daily.  . furosemide (LASIX) 20 MG tablet Take 1 tablet (20 mg total) by mouth daily.  . HYMarland KitchenROcodone-acetaminophen (NORCO) 10-325 MG tablet Take 1 tablet by mouth 3 (three) times daily as needed.  . Magnesium 200 MG TABS Take 1 tablet (200 mg total) by mouth 2 (two) times daily.  . metroNIDAZOLE (FLAGYL) 500 MG tablet Take 1 tablet (500 mg total) by mouth 3 (three) times daily.  . omMarland Kitchenprazole (PRILOSEC) 20 MG capsule Take 20 mg by mouth daily as needed (heartburn/indigestion). Reported on 07/21/2015  . Pirfenidone (ESBRIET) 267 MG CAPS Take by mouth as directed. Take 1 tablet three times a day for one week, then take 2 tablets three times a day for one week and then take 3 tablets three times a day thereafter  . Polyethyl Glycol-Propyl Glycol (SYSTANE) 0.4-0.3 % GEL ophthalmic gel Place 1 application into both eyes daily as needed (Use as directed).   . potassium chloride SA (K-DUR,KLOR-CON) 20 MEQ tablet Take 1 tablet  (20 mEq total) by mouth 2 (two) times daily.  . predniSONE (DELTASONE) 10 MG tablet Take 10 mg by mouth daily with breakfast.   . zolpidem (AMBIEN) 10 MG tablet Take 1 tablet (10 mg total) by mouth at bedtime as needed for sleep.   No facility-administered encounter medications on file as of 04/26/2016.      Review of Systems  Constitutional:   No  weight loss, night sweats,  Fevers, chills,  +fatigue, or  lassitude.  HEENT:   No headaches,  Difficulty swallowing,  Tooth/dental problems, or  Sore throat,                No sneezing, itching, ear ache, nasal congestion, post nasal drip,   CV:  No chest pain,  Orthopnea, PND, swelling in lower extremities,  anasarca, dizziness, palpitations, syncope.   GI  No heartburn, indigestion, abdominal pain, nausea, vomiting, diarrhea, change in bowel habits, loss of appetite, bloody stools.   Resp:    No chest wall deformity  Skin: no rash or lesions.  GU: no dysuria, change in color of urine, no urgency or frequency.  No flank pain, no hematuria   MS:  No joint pain or swelling.  No decreased range of motion.  No back pain.    Physical Exam  BP 124/76 (BP Location: Left Arm, Cuff Size: Large)   Pulse 73   SpO2 97%   GEN: A/Ox3; pleasant , NAD, well nourished    HEENT:  [place]/AT,  EACs-clear, TMs-wnl, NOSE-clear, THROAT-clear, no lesions, no postnasal drip or exudate noted.   NECK:  Supple w/ fair ROM; no JVD; normal carotid impulses w/o bruits; no thyromegaly or nodules palpated; no lymphadenopathy.    RESP  BB crackles ,  no accessory muscle use, no dullness to percussion  CARD:  RRR, 1/6 SM  1+ peripheral edema, pulses intact, no cyanosis or clubbing.  GI:   Soft & nt; nml bowel sounds; no organomegaly or masses detected.   Musco: Warm bil, no deformities or joint swelling noted.   Neuro: alert, no focal deficits noted.    Skin: Warm, no lesions or rashes    Lab Results:  CBC    Component Value Date/Time   WBC 12.8 (H)  04/22/2016 0932   RBC 4.80 04/22/2016 0932   HGB 15.6 04/22/2016 0932   HCT 46.5 04/22/2016 0932   HCT 46.8 12/12/2015 0821   PLT 119 (L) 04/22/2016 0932   PLT 128 (L) 12/12/2015 0821   MCV 96.9 04/22/2016 0932   MCV 95 12/12/2015 0821   MCH 32.5 04/22/2016 0932   MCHC 33.5 04/22/2016 0932   RDW 12.6 04/22/2016 0932   RDW 13.2 12/12/2015 0821   LYMPHSABS 1.4 04/22/2016 0932   LYMPHSABS 1.3 12/12/2015 0821   MONOABS 0.8 04/22/2016 0932   EOSABS 0.2 04/22/2016 0932   EOSABS 0.2 12/12/2015 0821   BASOSABS 0.0 04/22/2016 0932   BASOSABS 0.0 12/12/2015 0821    BMET    Component Value Date/Time   NA 138 04/21/2016 0209   NA 142 04/16/2016 0807   K 3.7 04/21/2016 0209   CL 103 04/21/2016 0209   CO2 29 04/21/2016 0209   GLUCOSE 96 04/21/2016 0209   BUN 8 04/21/2016 0209   BUN 11 04/16/2016 0807   CREATININE 0.94 04/21/2016 0209   CREATININE 0.81 06/13/2015 0728   CALCIUM 9.0 04/21/2016 0209   GFRNONAA >60 04/21/2016 0209   GFRAA >60 04/21/2016 0209    BNP    Component Value Date/Time   BNP 41.0 04/21/2016 0209    Hepatic Function Latest Ref Rng & Units 04/22/2016 07/21/2015 06/13/2015  Total Protein 6.5 - 8.1 g/dL 7.5 7.2 6.3  Albumin 3.5 - 5.0 g/dL 3.7 3.7 3.5(L)  AST 15 - 41 U/L '29 31 23  ' ALT 17 - 63 U/L '22 25 17  ' Alk Phosphatase 38 - 126 U/L 58 49 51  Total Bilirubin 0.3 - 1.2 mg/dL 3.4(H) 2.5(H) 1.3(H)  Bilirubin, Direct 0.1 - 0.5 mg/dL 0.5 - 0.3(H)    ProBNP No results found for: PROBNP  Imaging: Dg Chest 2 View  Result Date: 04/21/2016 CLINICAL DATA:  Acute onset left-sided chest pain, shortness of breath and nausea. Initial encounter. EXAM: CHEST  2 VIEW COMPARISON:  Chest radiograph from 10/24/2011 FINDINGS: The lungs are well-aerated. Bilateral  peripheral fibrotic change is noted, with patchy bilateral interstitial opacities, concerning for interstitial lung disease. There is no evidence of pleural effusion or pneumothorax. The heart is normal in size; the  mediastinal contour is within normal limits. No acute osseous abnormalities are seen. Clips are noted within the right upper quadrant, reflecting prior cholecystectomy. IMPRESSION: Bilateral peripheral fibrotic change, with patchy bilateral interstitial opacities, concerning for interstitial lung disease. High-resolution CT is recommended for further evaluation. Electronically Signed   By: [name] M.D.   On: 04/21/2016 03:35   Ct Abdomen Pelvis W Contrast  Result Date: 04/22/2016 CLINICAL DATA:  Lower abdominal pain EXAM: CT ABDOMEN AND PELVIS WITH CONTRAST TECHNIQUE: Multidetector CT imaging of the abdomen and pelvis was performed using the standard protocol following bolus administration of intravenous contrast. CONTRAST:  135m ISOVUE-300 IOPAMIDOL (ISOVUE-300) INJECTION 61% COMPARISON:  CT abdomen 04/04/2012 FINDINGS: Lower chest: Chronic scarring in the lung bases bilaterally. No acute infiltrate or effusion. Cardiac enlargement. Hepatobiliary: Cholecystectomy. Cirrhotic appearing liver with nodular contour to the liver surface. No focal liver mass. Pancreas: Negative Spleen: Negative Adrenals/Urinary Tract: 3 mm nonobstructing stone on the left. No renal mass or obstruction. Urinary bladder normal. Stomach/Bowel: Normal stomach. Duodenal diverticulum unchanged. Negative for bowel obstruction. Normal appendix Colonic diverticulosis, most prominent in the sigmoid colon. Negative for acute diverticulitis. Vascular/Lymphatic: Mild aortic atherosclerotic cyst. Negative for adenopathy. Reproductive: Moderate prostate enlargement. Other: No free fluid.  Negative for hernia Musculoskeletal: Negative IMPRESSION: Chronic scarring in the lung bases Cirrhotic liver without ascites or mass Cholecystectomy 3 mm nonobstructing stone left kidney.  No renal obstruction Sigmoid diverticulosis without diverticulitis Normal appendix Electronically Signed   By: CFranchot GalloM.D.   On: 04/22/2016 13:01      Assessment & Plan:   IPF (idiopathic pulmonary fibrosis) (HCC) IPF - appears stable with no increased O2 demands or decline in activity tolerance.  After extensive chart review pt will not be able to restart Esbriet as  Total bilirubin has continued to rise on Esbriet  Will need to place on allergy list . He will need to return in 4 weeks with repeat labs and discuss future therapy .   Plan  Patient Instructions  Finish Flagyl and Cipro as directed .  Do not restart Esbriet.  Keep follow up with PCP and Abdominal Ultrasound .   Increase Prilosec 268mTwice daily  .  Continue on Oxygen 2l/m with activity .  Follow up Dr. RaChase Callern 4 weeks with labs.  Please contact office for sooner follow up if symptoms do not improve or worsen or seek emergency care       Chronic respiratory failure (HCFlemingCont on O2 .   Hyperbilirubinemia ? Secondary to chronic liver dz as evidenced on CT abd/pelvis and worse on Esbriet  Cont follow up with PCP for wokrup and may need GI referral .  For now will not be able to proceed with Esbriet . Will see back in office and discuss future tx options in 4 weeks for IPF.  Will call pt and discuss this in detail.       TaRexene EdisonNP 04/26/2016

## 2016-04-26 NOTE — Telephone Encounter (Signed)
Curtis [name]  After I saw patient last visit 03/15/16 - no ful was given per Tammy Parrett esp with esbriet start. Please clarify  Thanks  Dr. Brand Males, M.D., [facility].C.P Pulmonary and Critical Care Medicine Staff Physician [place] [facility] Pager: [phone], If no answer or between  15:00h - 7:00h: call [phone]  04/26/2016 10:40 AM

## 2016-04-26 NOTE — Assessment & Plan Note (Signed)
IPF - appears stable with no increased O2 demands or decline in activity tolerance.  After extensive chart review pt will not be able to restart Esbriet as  Total bilirubin has continued to rise on Esbriet  Will need to place on allergy list . He will need to return in 4 weeks with repeat labs and discuss future therapy .   Plan  Patient Instructions  Finish Flagyl and Cipro as directed .  Do not restart Esbriet.  Keep follow up with PCP and Abdominal Ultrasound .   Increase Prilosec 20mg  Twice daily  .  Continue on Oxygen 2l/m with activity .  Follow up Dr. Chase Caller in 4 weeks with labs.  Please contact office for sooner follow up if symptoms do not improve or worsen or seek emergency care

## 2016-04-26 NOTE — Assessment & Plan Note (Signed)
Cont on O2 .  

## 2016-04-26 NOTE — Telephone Encounter (Signed)
Per the pt's chart an appt was never made. Pt saw TP on 04/26/16. Unsure if pt left without making appt with the intent to call back to schedule or not.

## 2016-04-26 NOTE — Telephone Encounter (Signed)
TP had called patient earlier today with new recommendations from today's visit after discussion with MR TP speaking personally with patient now on the phone New AVS has been printed for patient and placed in the mail Today's ov note sent to PCP Dr. Elsie Lincoln added to allergy/intolerance list per TP's request Esbriet removed from med list  Nothing further needed; will sign off

## 2016-04-26 NOTE — Telephone Encounter (Signed)
Correction - Today's appt 04/26/16 was the appt made at the last OV on 03/15/2016. Pt was told to come back in 6 weeks - 04/26/2016 is 6 weeks from 03/15/2016.

## 2016-04-29 ENCOUNTER — Encounter: Payer: Self-pay | Admitting: Family Medicine

## 2016-04-29 ENCOUNTER — Ambulatory Visit (INDEPENDENT_AMBULATORY_CARE_PROVIDER_SITE_OTHER): Payer: PPO | Admitting: Family Medicine

## 2016-04-29 ENCOUNTER — Other Ambulatory Visit (HOSPITAL_COMMUNITY): Payer: Self-pay | Admitting: *Deleted

## 2016-04-29 VITALS — BP 122/78 | Temp 97.8°F | Ht 73.25 in | Wt 265.2 lb

## 2016-04-29 DIAGNOSIS — R17 Unspecified jaundice: Secondary | ICD-10-CM

## 2016-04-29 DIAGNOSIS — G4700 Insomnia, unspecified: Secondary | ICD-10-CM

## 2016-04-29 DIAGNOSIS — R1030 Lower abdominal pain, unspecified: Secondary | ICD-10-CM | POA: Diagnosis not present

## 2016-04-29 MED ORDER — ZOLPIDEM TARTRATE 10 MG PO TABS: 10.0000 mg | ORAL_TABLET | Freq: Every evening | ORAL | 3 refills | Status: DC | PRN

## 2016-04-29 MED ORDER — APIXABAN 5 MG PO TABS
5.0000 mg | ORAL_TABLET | Freq: Two times a day (BID) | ORAL | 3 refills | Status: DC
Start: 2016-04-29 — End: 2016-06-04

## 2016-04-29 NOTE — Progress Notes (Signed)
   Subjective:    Patient ID: Curtis [name], male    DOB: 08-Oct-1945, 71 y.o.   MRN: [id number]  HPI Patient is here today for a follow up visit on abdominal pain. Patient states he is doing a lot better. Patient has completed antibiotic therapy. Acid reflux noted. Patient has no other concerns at this time.   This patient states his lower abdominal area is doing much better. Normal bowel movements. Denies vomiting. Recent CAT scan showed cirrhotic liver he states that several years back he had seen Dr. Ninfa Linden who he states did a biopsy of the liver and told him everything was okay. He has not seen a gastroenterologist. His bilirubin has gone up. This liver biopsy was back in 2014 and the report is in medical records with [facility] Review of Systems  Constitutional: Negative for activity change, fatigue and fever.  Respiratory: Negative for cough and shortness of breath.   Cardiovascular: Negative for chest pain and leg swelling.  Gastrointestinal: Negative for abdominal pain, blood in stool, diarrhea, nausea and vomiting.  Neurological: Negative for headaches.       Objective:   Physical Exam  Constitutional: He appears well-nourished. No distress.  Cardiovascular: Normal rate, regular rhythm and normal heart sounds.   No murmur heard. Pulmonary/Chest: Effort normal and breath sounds normal. No respiratory distress.  Musculoskeletal: He exhibits no edema.  Lymphadenopathy:    He has no cervical adenopathy.  Neurological: He is alert.  Psychiatric: His behavior is normal.  Vitals reviewed.         Assessment & Plan:  Lower abdominal pain resolved has finished antibiotics no need for further antibiotics  Bilirubin elevated concerning for cirrhosis issues. Lab work from back in 2014 was reviewed as well as biopsy of the liver from 2014. I believe the patient would benefit from seeing gastroenterology. His other specialists are with [facility] therefore we will get him set up  with gastroenterology in [facility]

## 2016-04-30 ENCOUNTER — Encounter: Payer: Self-pay | Admitting: Internal Medicine

## 2016-04-30 NOTE — Telephone Encounter (Signed)
Ok thanks . Good to know  Dr. Brand Males, M.D., [facility].C.P Pulmonary and Critical Care Medicine Staff Physician [place] [facility] Pager: [phone], If no answer or between  15:00h - 7:00h: call [phone]  04/30/2016 3:50 AM

## 2016-05-05 DIAGNOSIS — J8410 Pulmonary fibrosis, unspecified: Secondary | ICD-10-CM | POA: Diagnosis not present

## 2016-05-06 ENCOUNTER — Other Ambulatory Visit (HOSPITAL_COMMUNITY): Payer: Self-pay | Admitting: *Deleted

## 2016-05-06 MED ORDER — FUROSEMIDE 40 MG PO TABS: 40.0000 mg | ORAL_TABLET | Freq: Every day | ORAL | 3 refills | Status: DC

## 2016-05-16 DIAGNOSIS — C44329 Squamous cell carcinoma of skin of other parts of face: Secondary | ICD-10-CM | POA: Diagnosis not present

## 2016-05-16 DIAGNOSIS — C44519 Basal cell carcinoma of skin of other part of trunk: Secondary | ICD-10-CM | POA: Diagnosis not present

## 2016-05-20 NOTE — Telephone Encounter (Signed)
Ronny Bacon called from [facility] and they would like to coordinate delivery of Esbriet, but they need a rx. She can be reached at [phone] -pr

## 2016-05-21 ENCOUNTER — Other Ambulatory Visit: Payer: Self-pay | Admitting: Internal Medicine

## 2016-05-21 DIAGNOSIS — H53453 Other localized visual field defect, bilateral: Secondary | ICD-10-CM | POA: Diagnosis not present

## 2016-05-21 DIAGNOSIS — H02831 Dermatochalasis of right upper eyelid: Secondary | ICD-10-CM | POA: Diagnosis not present

## 2016-05-21 DIAGNOSIS — H02413 Mechanical ptosis of bilateral eyelids: Secondary | ICD-10-CM | POA: Diagnosis not present

## 2016-05-21 DIAGNOSIS — H02834 Dermatochalasis of left upper eyelid: Secondary | ICD-10-CM | POA: Diagnosis not present

## 2016-05-22 ENCOUNTER — Telehealth: Payer: Self-pay | Admitting: Family Medicine

## 2016-05-22 NOTE — Telephone Encounter (Signed)
Nurse's-please find out from the patient which Dr. prescribed his prednisone 10 mg daily initially? Was this pulmonologist at one of the [facility] centers? Long-term use of prednisone may or may not be needed but he has an appointment coming up with specialist in the summer-please find out information

## 2016-05-22 NOTE — Telephone Encounter (Signed)
Per TP's last OV note pt is not to restart Esbriet d/t intolerance, this is on pt's allergy list. Will sign off.

## 2016-05-22 NOTE — Telephone Encounter (Signed)
Defer to Dr Wolfgang Phoenix about prednisone. However, please gie him first avail in June/July 2018 to see me for IPF. His esbiet is on hold due to jaundice and he is seeing Dr Henrene Pastor 05/29/16

## 2016-05-22 NOTE — Telephone Encounter (Signed)
The patient and called for refills of prednisone originally started by cardiology at [facility] and continued by pulmonology at [facility] he will be seen pulmonologist in [place] coming up in the early summer. The patient may continue prednisone until then but it is very important he discuss with pulmonology if they recommend ongoing prednisone. Her prescription requests refills are being sent in

## 2016-05-22 NOTE — Telephone Encounter (Signed)
Received refill request for Prednisone 10mg , this has never been filled by pulmonary before. Per pt's chart it has been prescribed by Dr. Sallee Lange.   MR please advise if you would like to assume pt's prednisone refills, or defer to Dr. Wolfgang Phoenix. Thanks.

## 2016-05-22 NOTE — Telephone Encounter (Signed)
It would be fine to refill this for 3 refills. It is very important when this patient sees the new pulmonologist in the summertime that he discuss whether or not he needs to continue prednisone.

## 2016-05-22 NOTE — Telephone Encounter (Signed)
Patient stated that it was originally prescribed Cardiology at [facility] but he was told by his pulmonologist to continue to take it. Please advise?

## 2016-05-23 NOTE — Telephone Encounter (Signed)
Spoke with patient and informed him per Dr.Scott Luking- Prednisone was sent into pharmacy and to discuss with pulmonologist this summer if he should continue taking it ongoing. Patient verbalized understanding and stated that he will not pick up the prescription because he remembers his cardiologist had prescribed the medication and he does not want Dr.Scott having to prescribe a medication that was not originally prescribed by him. States he will talk with his cardiologist to get the prescription filled.

## 2016-05-24 ENCOUNTER — Ambulatory Visit: Payer: Medicare Other | Admitting: Internal Medicine

## 2016-05-26 ENCOUNTER — Emergency Department (HOSPITAL_COMMUNITY): Payer: PPO

## 2016-05-26 ENCOUNTER — Encounter (HOSPITAL_COMMUNITY): Payer: Self-pay

## 2016-05-26 ENCOUNTER — Emergency Department (HOSPITAL_COMMUNITY)
Admission: EM | Admit: 2016-05-26 | Discharge: 2016-05-27 | Disposition: A | Discharge status: Home / Self Care | Payer: PPO | Attending: Emergency Medicine | Admitting: Emergency Medicine

## 2016-05-26 DIAGNOSIS — R079 Chest pain, unspecified: Secondary | ICD-10-CM

## 2016-05-26 DIAGNOSIS — R110 Nausea: Secondary | ICD-10-CM | POA: Diagnosis not present

## 2016-05-26 DIAGNOSIS — R109 Unspecified abdominal pain: Secondary | ICD-10-CM

## 2016-05-26 DIAGNOSIS — R1013 Epigastric pain: Secondary | ICD-10-CM | POA: Diagnosis not present

## 2016-05-26 DIAGNOSIS — Z87891 Personal history of nicotine dependence: Secondary | ICD-10-CM | POA: Insufficient documentation

## 2016-05-26 DIAGNOSIS — I10 Essential (primary) hypertension: Secondary | ICD-10-CM | POA: Insufficient documentation

## 2016-05-26 DIAGNOSIS — J45909 Unspecified asthma, uncomplicated: Secondary | ICD-10-CM | POA: Diagnosis not present

## 2016-05-26 DIAGNOSIS — R0602 Shortness of breath: Secondary | ICD-10-CM | POA: Insufficient documentation

## 2016-05-26 DIAGNOSIS — R1030 Lower abdominal pain, unspecified: Secondary | ICD-10-CM | POA: Diagnosis not present

## 2016-05-26 LAB — CBC
HEMATOCRIT: 43 % (ref 39.0–52.0)
HEMOGLOBIN: 14.4 g/dL (ref 13.0–17.0)
MCH: 31.3 pg (ref 26.0–34.0)
MCHC: 33.5 g/dL (ref 30.0–36.0)
MCV: 93.5 fL (ref 78.0–100.0)
Platelets: 115 10*3/uL — ABNORMAL LOW (ref 150–400)
RBC: 4.6 MIL/uL (ref 4.22–5.81)
RDW: 12.7 % (ref 11.5–15.5)
WBC: 10.8 10*3/uL — ABNORMAL HIGH (ref 4.0–10.5)

## 2016-05-26 LAB — BASIC METABOLIC PANEL
Anion gap: 10 (ref 5–15)
BUN: 12 mg/dL (ref 6–20)
CALCIUM: 9 mg/dL (ref 8.9–10.3)
CO2: 23 mmol/L (ref 22–32)
Chloride: 104 mmol/L (ref 101–111)
Creatinine, Ser: 0.97 mg/dL (ref 0.61–1.24)
GFR calc Af Amer: >60 mL/min (ref >60)
GLUCOSE: 100 mg/dL — AB (ref 65–99)
POTASSIUM: 4.2 mmol/L (ref 3.5–5.1)
Sodium: 137 mmol/L (ref 135–145)

## 2016-05-26 LAB — HEPATIC FUNCTION PANEL
ALBUMIN: 3.4 g/dL — AB (ref 3.5–5.0)
ALK PHOS: 68 U/L (ref 38–126)
ALT: 20 U/L (ref 17–63)
AST: 28 U/L (ref 15–41)
BILIRUBIN DIRECT: 0.4 mg/dL (ref 0.1–0.5)
Indirect Bilirubin: 2.1 mg/dL — ABNORMAL HIGH (ref 0.3–0.9)
Total Bilirubin: 2.5 mg/dL — ABNORMAL HIGH (ref 0.3–1.2)
Total Protein: 7.1 g/dL (ref 6.5–8.1)

## 2016-05-26 LAB — BRAIN NATRIURETIC PEPTIDE: B Natriuretic Peptide: 28.4 pg/mL (ref 0.0–100.0)

## 2016-05-26 LAB — I-STAT TROPONIN, ED: Troponin i, poc: 0 ng/mL (ref 0.00–0.08)

## 2016-05-26 MED ORDER — ONDANSETRON 4 MG PO TBDP
4.0000 mg | ORAL_TABLET | Freq: Once | ORAL | Status: AC
  Administered 2016-05-26: 4 mg via ORAL

## 2016-05-26 MED ORDER — ONDANSETRON HCL 4 MG PO TABS: 4.0000 mg | ORAL_TABLET | Freq: Four times a day (QID) | ORAL | 0 refills | Status: DC

## 2016-05-26 MED ORDER — IOPAMIDOL (ISOVUE-300) INJECTION 61%
INTRAVENOUS | Status: AC
  Administered 2016-05-26: 100 mL
  Filled 2016-05-26: qty 100

## 2016-05-26 MED ORDER — ONDANSETRON 4 MG PO TBDP
ORAL_TABLET | ORAL | Status: AC
  Filled 2016-05-26: qty 1

## 2016-05-26 NOTE — ED Notes (Signed)
Pt reports having stomach problems for the past couple of month. Pt reports being diagnosed with diverticulitis 4 weeks ago. Pt reports being given antibiotics and his stomach being settled. Pt reports not being able to eat.

## 2016-05-26 NOTE — ED Notes (Signed)
Patient transported to X-ray. Will return in 5 mins

## 2016-05-26 NOTE — ED Provider Notes (Addendum)
I have personally seen and examined the patient. I have reviewed the documentation on PMH/FH/Soc Hx. I have discussed the plan of care with the resident and patient.  I have reviewed and agree with the resident's documentation. Please see associated encounter note.   EKG Interpretation  Date/Time:  Sunday May 26 2016 22:12:00 EDT Ventricular Rate:  84 PR Interval:    QRS Duration: 92 QT Interval:  401 QTC Calculation: 474 R Axis:   12 Text Interpretation:  Sinus rhythm Left ventricular hypertrophy NO STEMI   no significant changes since last tracing   Confirmed by [facility] MD, PEDRO ([id number]) on 05/26/2016 10:36:49 PM           Leonette Monarch Grayce Sessions, MD 05/26/16 [id number]

## 2016-05-26 NOTE — ED Notes (Signed)
Patient transported to CT 

## 2016-05-26 NOTE — ED Triage Notes (Signed)
Pt complaining of central chest pain that radiates to L arm and back. Pt complaining of nausea, deneis any emesis or diarrhea. Pt states recently dx'd with diverticulitis.

## 2016-05-26 NOTE — ED Provider Notes (Signed)
[facility] DEPT Provider Note   CSN: [id number] Arrival date & time: 05/26/16  2043     History   Chief Complaint Chief Complaint  Patient presents with  . Chest Pain  . Shortness of Breath    HPI Curtis [name] is a 71 y.o. male.  The history is provided by the patient.  Abdominal Pain   This is a new problem. The current episode started more than 2 days ago. The problem occurs constantly. The problem has not changed since onset.Associated with: "every time I take my pills it makes my stomach hurt" Pain location: also reports bilateral lower quadrant pain that reminds him of diverticulitis. The pain is moderate. Associated symptoms include anorexia, fever ("low grade"), nausea and constipation. Pertinent negatives include diarrhea and headaches. Exacerbated by: medications. Nothing relieves the symptoms.    Past Medical History:  Diagnosis Date  . Arthritis   . Asthma   . Atrial fibrillation ()   . Cervical spondylosis   . Chest pain    Emergency room October 24, 2011, no MI  //   Nuclear, October, 2013, adenosine, EF 61%, no scar or ischemia  . Dyslipidemia    Statin intolerant  . GERD (gastroesophageal reflux disease)   . Glaucoma   . Hearing loss   . Hypertension   . Idiopathic pulmonary fibrosis ([place])   . Interstitial pulmonary fibrosis ([place]) 2012  . Left shoulder pain 2011  . Psoriasis   . Statin intolerance     Patient Active Problem List   Diagnosis Date Noted  . Chronic respiratory failure ([place]) 04/26/2016  . Hyperbilirubinemia 04/26/2016  . H/O asbestos exposure 03/15/2016  . Persistent atrial fibrillation ([place]) 02/19/2016  . Atrial fibrillation, persistent ([place]) 02/19/2016  . IPF (idiopathic pulmonary fibrosis) ([place]) 02/09/2016  . Incidental lung nodule, greater than or equal to 75mm 02/09/2016  . Bell's palsy 08/03/2015  . Insomnia 06/08/2015  . Right trigeminal neuralgia 04/01/2014  . Chronic back pain 03/01/2014  . Elevated  transaminase level 06/25/2013  . Obesity (BMI 30-39.9) 09/09/2012  . Chronic neck pain 09/09/2012  . Symptomatic cholelithiasis 04/14/2012  . Chest pain   . Statin intolerance   . Dyslipidemia   . GERD (gastroesophageal reflux disease)   . Hypertension   . INTERSTITIAL LUNG DISEASE 03/16/2010  . PSORIASIS 03/16/2010    Past Surgical History:  Procedure Laterality Date  . BRAIN SURGERY    . CARDIOVERSION N/A 12/13/2015   Procedure: CARDIOVERSION;  Surgeon: Josue Hector, MD;  Location: [facility];  Service: Cardiovascular;  Laterality: N/A;  . CATARACT EXTRACTION W/PHACO Right 06/11/2012   Procedure: CATARACT EXTRACTION PHACO AND INTRAOCULAR LENS PLACEMENT ([place]);  Surgeon: Tonny Branch, MD;  Location: [facility];  Service: Ophthalmology;  Laterality: Right;  CDE:  14.43  . CATARACT EXTRACTION W/PHACO Left 06/29/2012   Procedure: CATARACT EXTRACTION PHACO AND INTRAOCULAR LENS PLACEMENT ([facility]);  Surgeon: Tonny Branch, MD;  Location: [facility];  Service: Ophthalmology;  Laterality: Left;  CDE:14.11  . CHOLECYSTECTOMY  05/08/2012  . CHOLECYSTECTOMY N/A 05/08/2012   Procedure: LAPAROSCOPIC CHOLECYSTECTOMY;  Surgeon: Harl Bowie, MD;  Location: [facility];  Service: General;  Laterality: N/A;  . COLONOSCOPY    . LIVER BIOPSY N/A 05/08/2012   Procedure: LIVER BIOPSY;  Surgeon: Harl Bowie, MD;  Location: [facility];  Service: General;  Laterality: N/A;  . NECK SURGERY     cervical disc with cliip       Home Medications      Family History Family  History  Problem Relation Age of Onset  . Hypertension Mother   . Congestive Heart Failure Mother   . COPD Mother   . Stroke Father   . Cancer Sister        breast    Social History Social History  Substance Use Topics  . Smoking status: Former Smoker    Packs/day: 0.50    Years: 52.00    Types: Cigarettes    Quit date: 09/08/2003  . Smokeless tobacco: Never Used  . Alcohol use No     Comment: quit drinking 2004     Allergies     Pirfenidone; Acyclovir and related; Oxycodone; Pravastatin; Simvastatin; Prednisone; and Tegretol [carbamazepine]   Review of Systems Review of Systems  Constitutional: Positive for fever ("low grade").  HENT: Negative.   Respiratory: Negative for cough and shortness of breath.   Cardiovascular: Positive for chest pain (lower chest).  Gastrointestinal: Positive for abdominal pain, anorexia, constipation and nausea. Negative for diarrhea.  Genitourinary: Negative.   Neurological: Negative for headaches.  All other systems reviewed and are negative.    Physical Exam Updated Vital Signs BP (!) 144/83   Pulse 91   Temp 98.7 F (37.1 C)   Resp 20   SpO2 95%   Physical Exam  Constitutional: He is oriented to person, place, and time. He appears well-developed and well-nourished. No distress.  HENT:  Head: Normocephalic and atraumatic.  Mouth/Throat: Oropharynx is clear and moist.  Eyes: Conjunctivae and EOM are normal. Pupils are equal, round, and reactive to light.  Neck: Neck supple.  Cardiovascular: Normal rate and regular rhythm.   Murmur (2/6 systolic ejection murmur best at apex) heard. Pulmonary/Chest: Effort normal. No respiratory distress. He has rales.  Crackles throughout  Abdominal: Soft. There is tenderness (ttp bilateral lower quadrant and epigastric).  Musculoskeletal: He exhibits edema (1+ pitting edema bilaterally).  Neurological: He is alert and oriented to person, place, and time. No cranial nerve deficit. He exhibits normal muscle tone. Coordination normal.  Skin: Skin is warm and dry.  Psychiatric: He has a normal mood and affect.  Nursing note and vitals reviewed.    ED Treatments / Results  Labs (all labs ordered are listed, but only abnormal results are displayed) Labs Reviewed  BASIC METABOLIC PANEL - Abnormal; Notable for the following:       Result Value   Glucose, Bld 100 (*)    All other components within normal limits  CBC - Abnormal;  Notable for the following:    WBC 10.8 (*)    Platelets 115 (*)    All other components within normal limits  HEPATIC FUNCTION PANEL - Abnormal; Notable for the following:    Albumin 3.4 (*)    Total Bilirubin 2.5 (*)    Indirect Bilirubin 2.1 (*)    All other components within normal limits  URINALYSIS, ROUTINE W REFLEX MICROSCOPIC - Abnormal; Notable for the following:    Color, Urine AMBER (*)    Specific Gravity, Urine 1.031 (*)    Protein, ur 30 (*)    Squamous Epithelial / LPF 0-5 (*)    All other components within normal limits  BRAIN NATRIURETIC PEPTIDE  TROPONIN I  I-STAT TROPOININ, ED  I-STAT TROPOININ, ED    EKG  EKG Interpretation  Date/Time:  Sunday May 26 2016 22:12:00 EDT Ventricular Rate:  84 PR Interval:  130 QRS Duration: 92 QT Interval:  401 QTC Calculation: 474 R Axis:   12 Text Interpretation:  Sinus rhythm  Left ventricular hypertrophy NO STEMI   no significant changes since last tracing   Confirmed by [facility] MD, [place] [phone]) on 05/26/2016 10:36:49 PM       Radiology Dg Chest 2 View  Result Date: 05/26/2016 CLINICAL DATA:  Central chest pain.  Shortness of breath. EXAM: CHEST  2 VIEW COMPARISON:  Radiograph 04/21/2016, lung bases from CT abdomen 04/22/2016 FINDINGS: Stable heart size and mediastinal contours, borderline mild cardiomegaly. Peripheral distribution of reticulation and honeycombing, most prominent at the lung bases, suggesting interstitial lung disease. This appears similar to prior exam. No evidence of superimposed pulmonary edema, focal airspace disease, pleural fluid or pneumothorax. Osseous structures are stable. IMPRESSION: Stable pattern of reticulation and probable honeycombing suggesting interstitial lung disease, probable IPF. No evidence of superimposed acute abnormality. High-resolution chest CT could be considered for interstitial lung disease characterization, on an elective basis. Electronically Signed   By: Jeb Levering M.D.    On: 05/26/2016 22:04   Ct Abdomen Pelvis W Contrast  Result Date: 05/27/2016 CLINICAL DATA:  Abdominal pain for 4-5 months. EXAM: CT ABDOMEN AND PELVIS WITH CONTRAST TECHNIQUE: Multidetector CT imaging of the abdomen and pelvis was performed using the standard protocol following bolus administration of intravenous contrast. CONTRAST:  143mL ISOVUE-300 IOPAMIDOL (ISOVUE-300) INJECTION 61% COMPARISON:  CT 1 month prior 04/22/2016 FINDINGS: Lower chest: Subpleural reticulation at the lung bases with peripheral honeycombing. No consolidation. No pleural fluid. Mild cardiomegaly. Coronary artery calcifications. Hepatobiliary: Nodular hepatic contour suggesting cirrhosis. No focal hepatic lesion. Clips in the gallbladder fossa postcholecystectomy. No biliary dilatation. Pancreas: No ductal dilatation or inflammation. Parenchymal atrophy. Spleen: Mildly enlarged measuring 13.5 cm cranial caudal. Subcentimeter hypodensity at the hilum is nonspecific. Adrenals/Urinary Tract: No adrenal nodule. No hydronephrosis. Bilateral renal cortical thinning. Homogeneous enhancement and excretion on delayed phase imaging. Tiny subcentimeter cortical hypodensities are too small to characterize. Nonobstructing interpolar left renal stone. Urinary bladder is decompressed and not well evaluated. Stomach/Bowel: The stomach is decompressed. Unchanged duodenum diverticulum. No small bowel dilatation or inflammation. No obstruction. Multifocal colonic diverticulosis most prominent in the sigmoid colon, no acute inflammation. No colonic wall thickening. Normal appendix. Vascular/Lymphatic: Aortic atherosclerosis. Small porta hepatis nodes are not enlarged by size criteria and likely reactive. No enlarged abdominal or pelvic lymph nodes. Reproductive: Prostate is unremarkable. Other: No free air, free fluid, or intra-abdominal fluid collection. Tiny fat containing umbilical hernia. Minimal fat in the inguinal canals, left greater than right.  Musculoskeletal: There are no acute or suspicious osseous abnormalities. IMPRESSION: 1. No acute abnormality or change from prior exam. 2. Cirrhotic hepatic morphology.  Mild splenomegaly.  No ascites. 3. Colonic diverticulosis without acute inflammation. Nonobstructing left nephrolithiasis. 4. Aortic atherosclerosis. Electronically Signed   By: Jeb Levering M.D.   On: 05/27/2016 00:05    Procedures Procedures (including critical care time)  Medications Ordered in ED Medications  ondansetron (ZOFRAN-ODT) disintegrating tablet 4 mg (4 mg Oral Given 05/26/16 2054)  iopamidol (ISOVUE-300) 61 % injection (100 mLs  Contrast Given 05/26/16 2342)     Initial Impression / Assessment and Plan / ED Course  I have reviewed the triage vital signs and the nursing notes.  Pertinent labs & imaging results that were available during my care of the patient were reviewed by me and considered in my medical decision making (see chart for details).     65yoM with multiple medical problems, but recently started on antiarrhymics for afib as well as atx for cellulitis of the back, who present with multiple complaints, but  mainly concerned for nausea and vague abdominal pain associated with his pill burden. Has atypical chest pain that is reproducible with palpation of epigastric region. Further history and exam as above. VS reassuring and exam as above. ekg unchanged from prior and with no acute ischemic changes. Labs with neg trop, mild leukocytosis, reassuring chemistry. Will obtain delta trop given low suspicion for acs and will obtain ct ab/pelvis to eval for intraabdominal pathology. If no acute findings will likely need close f/u with his pcp and cardiology for continued discussions on medication options.   Final Clinical Impressions(s) / ED Diagnoses   Final diagnoses:  Abdominal pain, unspecified abdominal location  Chest pain, unspecified type  Nausea    New Prescriptions Discharge Medication List  as of 05/27/2016  2:06 AM    START taking these medications   Details  ondansetron (ZOFRAN) 4 MG tablet Take 1 tablet (4 mg total) by mouth every 6 (six) hours., Starting Sun 05/26/2016, Print         Heriberto Antigua, MD 05/27/16 [id number]

## 2016-05-27 ENCOUNTER — Telehealth: Payer: Self-pay | Admitting: Family Medicine

## 2016-05-27 LAB — URINALYSIS, ROUTINE W REFLEX MICROSCOPIC
BACTERIA UA: NONE SEEN
Bilirubin Urine: NEGATIVE
GLUCOSE, UA: NEGATIVE mg/dL
Hgb urine dipstick: NEGATIVE
KETONES UR: NEGATIVE mg/dL
Leukocytes, UA: NEGATIVE
Nitrite: NEGATIVE
PROTEIN: 30 mg/dL — AB
Specific Gravity, Urine: 1.031 — ABNORMAL HIGH (ref 1.005–1.030)
pH: 5 (ref 5.0–8.0)

## 2016-05-27 LAB — I-STAT TROPONIN, ED: Troponin i, poc: 0.01 ng/mL (ref 0.00–0.08)

## 2016-05-27 LAB — TROPONIN I

## 2016-05-27 NOTE — ED Notes (Signed)
Recollect istat troponin

## 2016-05-27 NOTE — Telephone Encounter (Signed)
See ER report with labs and CXR and CT scan

## 2016-05-27 NOTE — Telephone Encounter (Signed)
Patient went to the ER over the weekend due to abdominal pain.  He lost his appetite, had nausea, and ran a fever. He said he thinks it has something to do with his heart medication and would like to know what Dr. Nicki Reaper thinks.  He feels a lot better today, but is going to try to get in with his heart doctor in the morning.

## 2016-05-27 NOTE — ED Notes (Signed)
Pt departed in NAD, refused use of wheelchair.  

## 2016-05-28 ENCOUNTER — Encounter (HOSPITAL_COMMUNITY): Payer: Self-pay | Admitting: Nurse Practitioner

## 2016-05-28 ENCOUNTER — Ambulatory Visit (HOSPITAL_COMMUNITY)
Admission: RE | Admit: 2016-05-28 | Discharge: 2016-05-28 | Disposition: A | Discharge status: Home / Self Care | Payer: PPO | Source: Ambulatory Visit | Attending: Nurse Practitioner | Admitting: Nurse Practitioner

## 2016-05-28 VITALS — BP 106/58 | HR 83 | Ht 69.0 in | Wt 257.0 lb

## 2016-05-28 DIAGNOSIS — Z803 Family history of malignant neoplasm of breast: Secondary | ICD-10-CM | POA: Diagnosis not present

## 2016-05-28 DIAGNOSIS — Z823 Family history of stroke: Secondary | ICD-10-CM | POA: Diagnosis not present

## 2016-05-28 DIAGNOSIS — Z7901 Long term (current) use of anticoagulants: Secondary | ICD-10-CM | POA: Diagnosis not present

## 2016-05-28 DIAGNOSIS — C44629 Squamous cell carcinoma of skin of left upper limb, including shoulder: Secondary | ICD-10-CM | POA: Diagnosis not present

## 2016-05-28 DIAGNOSIS — Z8249 Family history of ischemic heart disease and other diseases of the circulatory system: Secondary | ICD-10-CM | POA: Insufficient documentation

## 2016-05-28 DIAGNOSIS — Z7951 Long term (current) use of inhaled steroids: Secondary | ICD-10-CM | POA: Insufficient documentation

## 2016-05-28 DIAGNOSIS — Z825 Family history of asthma and other chronic lower respiratory diseases: Secondary | ICD-10-CM | POA: Diagnosis not present

## 2016-05-28 DIAGNOSIS — L409 Psoriasis, unspecified: Secondary | ICD-10-CM | POA: Diagnosis not present

## 2016-05-28 DIAGNOSIS — R110 Nausea: Secondary | ICD-10-CM

## 2016-05-28 DIAGNOSIS — I481 Persistent atrial fibrillation: Secondary | ICD-10-CM | POA: Insufficient documentation

## 2016-05-28 DIAGNOSIS — I4891 Unspecified atrial fibrillation: Secondary | ICD-10-CM | POA: Diagnosis not present

## 2016-05-28 DIAGNOSIS — H9190 Unspecified hearing loss, unspecified ear: Secondary | ICD-10-CM | POA: Insufficient documentation

## 2016-05-28 DIAGNOSIS — Z885 Allergy status to narcotic agent status: Secondary | ICD-10-CM | POA: Diagnosis not present

## 2016-05-28 DIAGNOSIS — M1990 Unspecified osteoarthritis, unspecified site: Secondary | ICD-10-CM | POA: Diagnosis not present

## 2016-05-28 DIAGNOSIS — Z888 Allergy status to other drugs, medicaments and biological substances status: Secondary | ICD-10-CM | POA: Diagnosis not present

## 2016-05-28 DIAGNOSIS — H409 Unspecified glaucoma: Secondary | ICD-10-CM | POA: Insufficient documentation

## 2016-05-28 DIAGNOSIS — Z79899 Other long term (current) drug therapy: Secondary | ICD-10-CM | POA: Insufficient documentation

## 2016-05-28 DIAGNOSIS — E785 Hyperlipidemia, unspecified: Secondary | ICD-10-CM | POA: Diagnosis not present

## 2016-05-28 DIAGNOSIS — Z87891 Personal history of nicotine dependence: Secondary | ICD-10-CM | POA: Insufficient documentation

## 2016-05-28 DIAGNOSIS — I10 Essential (primary) hypertension: Secondary | ICD-10-CM | POA: Insufficient documentation

## 2016-05-28 DIAGNOSIS — K219 Gastro-esophageal reflux disease without esophagitis: Secondary | ICD-10-CM | POA: Insufficient documentation

## 2016-05-28 DIAGNOSIS — J45909 Unspecified asthma, uncomplicated: Secondary | ICD-10-CM | POA: Insufficient documentation

## 2016-05-28 DIAGNOSIS — J84112 Idiopathic pulmonary fibrosis: Secondary | ICD-10-CM | POA: Insufficient documentation

## 2016-05-28 DIAGNOSIS — L988 Other specified disorders of the skin and subcutaneous tissue: Secondary | ICD-10-CM | POA: Diagnosis not present

## 2016-05-28 MED ORDER — MAGNESIUM 200 MG PO TABS: 200.0000 mg | ORAL_TABLET | Freq: Every day | ORAL | Status: DC

## 2016-05-28 MED ORDER — POTASSIUM CHLORIDE CRYS ER 20 MEQ PO TBCR: 20.0000 meq | EXTENDED_RELEASE_TABLET | Freq: Every day | ORAL | 6 refills | Status: DC

## 2016-05-28 NOTE — Telephone Encounter (Signed)
Please let the patient know I reviewed over his lab work and his scans. I do not see anything that sticks out. I would recommend if he has ongoing abdominal pain or fever to follow-up with [place]

## 2016-05-28 NOTE — Progress Notes (Signed)
Primary Care Physician: Kathyrn Drown, MD Referring Physician: Dr. Jerolyn Center Tomasetti is a 71 y.o. male with a h/o of persistent afib in the atrial fibrillation clinic for evaluation.  H/o new onset since July of  by ECG at Dr Lance Sell office. History of COPD and chronic interstitial lung dx with dyspnea and fatigue Previously seen by cardiology 2013 and had normal myoview study with EF 61%.  Done for atypical chest pain. History of HTN and elevated lipids but Intolerant to statins. Is followed at [facility] for UIP and instertial lung disease .  He has a successful  cardioversion 12/5 which held pt in rhythm x 2 weeks. He saw a significant improvement in SR. He is on apixaban 5 mg bid and takes without fail.On initial visit, various antiarrythmic's were discussed with wife and pt and they wanted to look into costs. He was also on prednisone and levaquin at the time for UTI and wanted to finish. He checked prices on drugs and reviewed options again. He does want to try flecainide but will need a stress test since last test was 5 years ok and if ok will start drug right away. Stress test showed perfusion defect and Dt. Allred recommended avoiding flecainide for possibility of CAD. He was admitted to [facility] for tikosyn .  F/u's 2/22 after successful tikosyn load. He did convert without cardioversion. Qtc stayed stable. HCTZ was stopped and  he has picked up 7 lbs and has LLE.   He was started on lasix 20 mg a day and was asked to f/u in one week with bmet/amg and EKG. He was not losing weight fast enough so he doubled lasix to 40 mg a day. His qtc has prolonged and repeat  bmet/mag show a k+/ mag of 3.7/1.9 .otherwise is in SR.   Returns 3/2 for f/u ekg and labs. He held tikosyn x one dose last night and doubled up on K+ and mag. Yesterday and today. QTC is improved today and k+/mag are in normal range.  F/u 5/22 in afib clinic, he asked to be seen for nausea and abdominal pain.He says that this  has been ongoing since he started supplements for tikosyn to increase K+/mag levels but has gotten so bad that he went to the ER over the weekend. No findings in the ER. He occassionally will not take the mag/potassium and will have much more appetitive and no nausea or abdominal pain. He feels so bad that he wants to quit tikosyn so he can get off the supplements. He has been staying in SR but would rather be in afib than have the stomach issues he is having. He is scheduled to see GI tomorrow. Review of chart shows chronically elevated bilirubin.  Today, he denies symptoms of palpitations,  orthopnea, PND, lower extremity edema, dizziness, presyncope, syncope, or neurologic sequela.  Positive for shortness of breath and fatigue, palpitations.The patient is tolerating medications without difficulties and is otherwise without complaint today.   Past Medical History:  Diagnosis Date  . Arthritis   . Asthma   . Atrial fibrillation ([place])   . Cervical spondylosis   . Chest pain    Emergency room October 24, 2011, no MI  //   Nuclear, October, 2013, adenosine, EF 61%, no scar or ischemia  . Dyslipidemia    Statin intolerant  . GERD (gastroesophageal reflux disease)   . Glaucoma   . Hearing loss   . Hypertension   . Idiopathic pulmonary fibrosis ([place])   .  Interstitial pulmonary fibrosis ([place]) 2012  . Left shoulder pain 2011  . Psoriasis   . Statin intolerance    Past Surgical History:  Procedure Laterality Date  . BRAIN SURGERY    . CARDIOVERSION N/A 12/13/2015   Procedure: CARDIOVERSION;  Surgeon: Josue Hector, MD;  Location: [facility];  Service: Cardiovascular;  Laterality: N/A;  . CATARACT EXTRACTION W/PHACO Right 06/11/2012   Procedure: CATARACT EXTRACTION PHACO AND INTRAOCULAR LENS PLACEMENT ([place]);  Surgeon: Tonny Branch, MD;  Location: [facility];  Service: Ophthalmology;  Laterality: Right;  CDE:  14.43  . CATARACT EXTRACTION W/PHACO Left 06/29/2012   Procedure: CATARACT EXTRACTION PHACO  AND INTRAOCULAR LENS PLACEMENT ([facility]);  Surgeon: Tonny Branch, MD;  Location: [facility];  Service: Ophthalmology;  Laterality: Left;  CDE:14.11  . CHOLECYSTECTOMY  05/08/2012  . CHOLECYSTECTOMY N/A 05/08/2012   Procedure: LAPAROSCOPIC CHOLECYSTECTOMY;  Surgeon: Harl Bowie, MD;  Location: [facility];  Service: General;  Laterality: N/A;  . COLONOSCOPY    . LIVER BIOPSY N/A 05/08/2012   Procedure: LIVER BIOPSY;  Surgeon: Harl Bowie, MD;  Location: [facility];  Service: General;  Laterality: N/A;  . NECK SURGERY     cervical disc with cliip    Current Outpatient Prescriptions  Medication Sig Dispense Refill  . albuterol (PROVENTIL HFA;VENTOLIN HFA) 108 (90 Base) MCG/ACT inhaler Inhale 2 puffs into the lungs every 6 (six) hours as needed for wheezing. 1 Inhaler 2  . apixaban (ELIQUIS) 5 MG TABS tablet Take 1 tablet (5 mg total) by mouth 2 (two) times daily. 180 tablet 3  . cephALEXin (KEFLEX) 500 MG capsule Take 500 mg by mouth 4 (four) times daily.  0  . diltiazem (CARDIZEM CD) 240 MG 24 hr capsule Take 1 capsule (240 mg total) by mouth daily. 90 capsule 3  . dofetilide (TIKOSYN) 125 MCG capsule Take 1 capsule (125 mcg total) by mouth 2 (two) times daily. Take along with 236mcg twice a day to total 342mcg 60 capsule 3  . dofetilide (TIKOSYN) 250 MCG capsule Take 1 capsule (250 mcg total) by mouth 2 (two) times daily. Take along with 17mcg twice a day to total 375mg  60 capsule 6  . fluticasone (FLONASE) 50 MCG/ACT nasal spray Place 2 sprays into both nostrils daily. 16 g 5  . furosemide (LASIX) 40 MG tablet Take 1 tablet (40 mg total) by mouth daily. 30 tablet 3  . HYDROcodone-acetaminophen (NORCO) 10-325 MG tablet Take 1 tablet by mouth 3 (three) times daily as needed. 90 tablet 0  . Magnesium 200 MG TABS Take 1 tablet (200 mg total) by mouth daily. 30 each   . omeprazole (PRILOSEC) 20 MG capsule Take 20 mg by mouth daily as needed (heartburn/indigestion). Reported on 07/21/2015    . ondansetron  (ZOFRAN) 4 MG tablet Take 1 tablet (4 mg total) by mouth every 6 (six) hours. 8 tablet 0  . potassium chloride SA (K-DUR,KLOR-CON) 20 MEQ tablet Take 1 tablet (20 mEq total) by mouth at bedtime. 60 tablet 6  . zolpidem (AMBIEN) 10 MG tablet Take 1 tablet (10 mg total) by mouth at bedtime as needed for sleep. 30 tablet 3   No current facility-administered medications for this encounter.     Allergies  Allergen Reactions  . Pirfenidone     Elevated bilirubin  . Acyclovir And Related   . Oxycodone Itching  . Pravastatin Other (See Comments)    Chills    . Simvastatin Other (See Comments)    Chills    .  Prednisone Anxiety    Irritation  . Tegretol [Carbamazepine] Other (See Comments)    dizziness    Social History   Social History  . Marital status: Married    Spouse name: N/A  . Number of children: N/A  . Years of education: N/A   Occupational History  . Not on file.   Social History Main Topics  . Smoking status: Former Smoker    Packs/day: 0.50    Years: 52.00    Types: Cigarettes    Quit date: 09/08/2003  . Smokeless tobacco: Never Used  . Alcohol use No     Comment: quit drinking 2004  . Drug use: No  . Sexual activity: Yes    Birth control/ protection: None   Other Topics Concern  . Not on file   Social History Narrative  . No narrative on file    Family History  Problem Relation Age of Onset  . Hypertension Mother   . Congestive Heart Failure Mother   . COPD Mother   . Stroke Father   . Cancer Sister        breast    ROS- All systems are reviewed and negative except as per the HPI above  Physical Exam: Vitals:   05/28/16 0850  BP: (!) 106/58  Pulse: 83  Weight: 257 lb (116.6 kg)  Height: 5\' 9"  (1.753 m)   Wt Readings from Last 3 Encounters:  05/28/16 257 lb (116.6 kg)  05/26/16 265 lb (120.2 kg)  04/29/16 265 lb 4 oz (120.3 kg)    Labs: Lab Results  Component Value Date   NA 137 05/26/2016   K 4.2 05/26/2016   CL 104 05/26/2016     CO2 23 05/26/2016   GLUCOSE 100 (H) 05/26/2016   BUN 12 05/26/2016   CREATININE 0.97 05/26/2016   CALCIUM 9.0 05/26/2016   MG 2.0 04/16/2016   Lab Results  Component Value Date   INR 1.1 12/12/2015   Lab Results  Component Value Date   CHOL 145 06/13/2015   HDL 33 (L) 06/13/2015   LDLCALC 92 06/13/2015   TRIG 101 06/13/2015     GEN- The patient is well appearing, alert and oriented x 3 today.   Head- normocephalic, atraumatic Eyes-  Sclera clear, conjunctiva pink Ears- hearing intact Oropharynx- clear Neck- supple, no JVP Lymph- no cervical lymphadenopathy Lungs- Clear to ausculation bilaterally, normal work of breathing Heart- regular rate and rhythm, no murmurs, rubs or gallops, PMI not laterally displaced GI- soft, NT, ND, + BS Extremities- no clubbing, cyanosis, or  Trace to 1+ pitting edema MS- no significant deformity or atrophy Skin- no rash or lesion Psych- euthymic mood, full affect Neuro- strength and sensation are intact  EKG- NSR at 83 bpm, pr int 130 ms, qrs int 86 ms, qtc 509 (mildly prolonged) Epic records reviewed   Assessment and Plan: 1. Persistent symptomatic afib Maintainig SR since starting Tikosyn  but is having abdominal pain/ nausea/anorexia and believes it is from magnesium and potassium supplements Discussed with Dr. Curt Bears He feels we should reduce mag and K+ to one a day and see if pt sees improvement before we stop tikosyn and supplements as pt wishes He is willing to try this for now and see what GI MD says tomorrow But he is adamant if he sees no improvement he wants to stop tik/mag/pot He will continue on apixaban   F/u in one week with  bmet/mag and ekg   Butch Penny C. Kayleen Memos, ANP-C Afib  [facility] [address] [place], [place] [address] [phone]

## 2016-05-28 NOTE — Patient Instructions (Signed)
Your physician has recommended you make the following change in your medication:  1)Decrease magnesium to 200mg  once a day 2)Decrease potassium 62meq once a day at bedtime

## 2016-05-28 NOTE — Telephone Encounter (Signed)
Left message to return call 

## 2016-05-29 ENCOUNTER — Encounter: Payer: Self-pay | Admitting: Internal Medicine

## 2016-05-29 ENCOUNTER — Ambulatory Visit (INDEPENDENT_AMBULATORY_CARE_PROVIDER_SITE_OTHER): Payer: PPO | Admitting: Internal Medicine

## 2016-05-29 VITALS — BP 126/78 | HR 80 | Ht 73.0 in | Wt 256.8 lb

## 2016-05-29 DIAGNOSIS — R7989 Other specified abnormal findings of blood chemistry: Secondary | ICD-10-CM | POA: Diagnosis not present

## 2016-05-29 DIAGNOSIS — R110 Nausea: Secondary | ICD-10-CM | POA: Diagnosis not present

## 2016-05-29 DIAGNOSIS — R945 Abnormal results of liver function studies: Secondary | ICD-10-CM

## 2016-05-29 DIAGNOSIS — R109 Unspecified abdominal pain: Secondary | ICD-10-CM | POA: Diagnosis not present

## 2016-05-29 DIAGNOSIS — K7030 Alcoholic cirrhosis of liver without ascites: Secondary | ICD-10-CM

## 2016-05-29 NOTE — Patient Instructions (Signed)
Please follow up with Dr. Amedeo Plenty

## 2016-05-30 ENCOUNTER — Encounter: Payer: Self-pay | Admitting: Internal Medicine

## 2016-05-30 NOTE — Progress Notes (Signed)
HISTORY OF PRESENT ILLNESS:  Curtis [name] is a 71 y.o. male , kindly referred by his primary care provider Dr. Sallee [name], for evaluation of chief complaints of abdominal pain, nausea, and abnormal liver tests. Office notes, laboratories, x-rays, and prior endoscopy reports reviewed. The patient has multiple significant medical problems including idiopathic pulmonary fibrosis, atrial fibrillation, hypertension, obesity, and adenomatous colon polyps. He also has a history of chronic alcohol and tobacco abuse. He was sent to this office not realizing that he already had an established relationship with her local gastroenterologist Dr. Teena [name] of [facility]. The patient has had multiple colonoscopies with Dr. Amedeo [name]. Most recently June 2015. The examination was normal. Follow-up in 5 years (history of polyps) recommended. The patient states that he has a good relationship with Dr. Amedeo [name] and wishes to maintain that professional relationship. In any event, he has been having problems off and on with vague generalized abdominal pain and nausea. No vomiting. He feels that his symptoms are exacerbated by medications and relieved by not taking medications. The patient is not a particularly good historian. Furthermore, he rambles on to multiple irrelevant topics. As well, constantly interjects his own notions as to what is wrong with him and how he wishes to handle it. Specifically, he is adamant that all of his problems are related to certain medications which he vehemently refuses to take. He states that off of these medications his symptoms have improved. He continues to work with his cardiologist, pulmonologist, and internist on his myriad of medical problems. He is on increasing doses of narcotics for chronic pain (arthritis and neck pain). He feels he needs this for relief and function. He has had associated constipation. Abdominal discomfort is worse with constipation. He did have a contrast-enhanced CT scan  of the abdomen and pelvis just 3 days ago. No acute abnormality found. There was changes of hepatic cirrhosis and mild splenomegaly without ascites. Diverticulosis incidentally noted. Reviewing his blood work, his liver tests are normal except for elevated bilirubin which is principally indirect. He has had this for some time. CBC is normal except for platelets of 115,000. He did undergo cholecystectomy 2014. Liver biopsy at that time showed evidence of inflammation and bridging fibrosis which was said to be mild. For his constipation and takes stool softeners with variable results. He is on chronic omeprazole for GERD. No active reflux symptoms on PPI. He denies dysphagia or weight loss.  REVIEW OF SYSTEMS:  All non-GI ROS negative except for sinus allergy, arthritis, headaches, hearing problems, sleeping problems, leg swelling  Past Medical History:  Diagnosis Date  . Arthritis   . Asthma   . Atrial fibrillation ([place])   . Cervical spondylosis   . Chest pain    Emergency room October 24, 2011, no MI  //   Nuclear, October, 2013, adenosine, EF 61%, no scar or ischemia  . Dyslipidemia    Statin intolerant  . GERD (gastroesophageal reflux disease)   . Glaucoma   . Hearing loss   . Hypertension   . Idiopathic pulmonary fibrosis ([place])   . Interstitial pulmonary fibrosis ([place]) 2012  . Left shoulder pain 2011  . Psoriasis   . Statin intolerance     Past Surgical History:  Procedure Laterality Date  . BRAIN SURGERY    . CARDIOVERSION N/A 12/13/2015   Procedure: CARDIOVERSION;  Surgeon: Curtis Hector, MD;  Location: [facility];  Service: Cardiovascular;  Laterality: N/A;  . CATARACT EXTRACTION W/PHACO Right 06/11/2012   Procedure: CATARACT  EXTRACTION PHACO AND INTRAOCULAR LENS PLACEMENT (IOC);  Surgeon: Curtis Branch, MD;  Location: [facility];  Service: Ophthalmology;  Laterality: Right;  CDE:  14.43  . CATARACT EXTRACTION W/PHACO Left 06/29/2012   Procedure: CATARACT EXTRACTION PHACO AND  INTRAOCULAR LENS PLACEMENT (IOC);  Surgeon: Curtis Branch, MD;  Location: [facility];  Service: Ophthalmology;  Laterality: Left;  CDE:14.11  . CHOLECYSTECTOMY  05/08/2012  . CHOLECYSTECTOMY N/A 05/08/2012   Procedure: LAPAROSCOPIC CHOLECYSTECTOMY;  Surgeon: Curtis Bowie, MD;  Location: [facility];  Service: General;  Laterality: N/A;  . COLONOSCOPY    . LIVER BIOPSY N/A 05/08/2012   Procedure: LIVER BIOPSY;  Surgeon: Curtis Bowie, MD;  Location: [facility];  Service: General;  Laterality: N/A;  . NECK SURGERY     cervical disc with cliip    Social History Curtis [name]  reports that he quit smoking about 12 years ago. His smoking use included Cigarettes. He has a 26.00 pack-year smoking history. He has never used smokeless tobacco. He reports that he does not drink alcohol or use drugs.  family history includes COPD in his mother; Cancer in his sister; Congestive Heart Failure in his mother; Hypertension in his mother; Stroke in his father.  Allergies  Allergen Reactions  . Pirfenidone     Elevated bilirubin  . Acyclovir And Related   . Oxycodone Itching  . Pravastatin Other (See Comments)    Chills    . Simvastatin Other (See Comments)    Chills    . Prednisone Anxiety    Irritation  . Tegretol [Carbamazepine] Other (See Comments)    dizziness       PHYSICAL EXAMINATION: Vital signs: BP 126/78   Pulse 80   Ht '6\' 1"'  (1.854 m)   Wt 256 lb 12.8 oz (116.5 kg)   BMI 33.88 kg/m   Constitutional: Unhealthy appearing, obese,, no acute distress Psychiatric: alert and oriented x3, cooperative though interrupts frequently Eyes: extraocular movements intact, anicteric, conjunctiva pink Mouth: oral pharynx moist, no lesions Neck: supple without thyromegaly Lymph: no lymphadenopathy Cardiovascular: heart regular rate and rhythm, no murmur Lungs: clear to auscultation bilaterally Abdomen: soft, obese, nontender, nondistended, no obvious ascites, no peritoneal signs, normal bowel  sounds, no organomegaly Rectal: Omitted Extremities: no clubbing or cyanosis. 1+ lower extremity edema bilaterally Skin: no lesions on visible extremities. He does have several spider ectasias on his chest Neuro: No focal deficits. Cranial nerves intact. No asterixis.   ASSESSMENT:  #1. Chronic abdominal pain. Most likely potential causes include constipation and/or narcotic gut. No worrisome problem suspected based on history, imaging, exam, labs, and prior colonoscopy. He is status post cholecystectomy #2. Chronic nausea. Likely medication related. Could be exacerbated by narcotic related to dysmotility #3. Hepatic cirrhosis. Well compensated. Likely secondary to long-standing alcohol abuse. No recent alcohol use. Evidence of mild portal hypertension as evidenced by mild splenomegaly and slightly decreased platelet count. No varices noted on imaging #4. History of adenomatous colon polyps followed by Dr. Amedeo [name] #5. Multiple cardiopulmonary problems   PLAN:  #1. MiraLAX daily to regulate bowels. Adjust as needed #2. Minimize narcotics as this may help problems with constipation, pain, nausea. The patient does not seem interested in this strategy at present #3. Continue PPI for upper GI mucosal protection and treatment of possible GERD #4. No recommendations with regards to liver disease. Observe for complications #5. Reestablish GI care with Dr. Amedeo [name] per patient preference. Future GI consultation should be sought with Dr. Amedeo [name].  60 minutes has been spent  face-to-face with this patient. Greater than 50% a time was used for counseling regarding his chronic abdominal pain, chronic nausea, liver disease, imaging abnormalities, laboratory abnormalities, and reinforcement of the plan. This was quite cumbersome. Hopefully somewhat helpful for him.    A copy of this consultation note has been sent to Dr. Wolfgang Phoenix

## 2016-06-04 ENCOUNTER — Encounter (HOSPITAL_COMMUNITY): Payer: Self-pay | Admitting: Nurse Practitioner

## 2016-06-04 ENCOUNTER — Ambulatory Visit (HOSPITAL_COMMUNITY)
Admission: RE | Admit: 2016-06-04 | Discharge: 2016-06-04 | Disposition: A | Discharge status: Home / Self Care | Payer: PPO | Source: Ambulatory Visit | Attending: Nurse Practitioner | Admitting: Nurse Practitioner

## 2016-06-04 VITALS — BP 108/72 | HR 87 | Ht 73.0 in | Wt 249.4 lb

## 2016-06-04 DIAGNOSIS — E785 Hyperlipidemia, unspecified: Secondary | ICD-10-CM | POA: Diagnosis not present

## 2016-06-04 DIAGNOSIS — R634 Abnormal weight loss: Secondary | ICD-10-CM | POA: Diagnosis not present

## 2016-06-04 DIAGNOSIS — J45909 Unspecified asthma, uncomplicated: Secondary | ICD-10-CM | POA: Diagnosis not present

## 2016-06-04 DIAGNOSIS — I481 Persistent atrial fibrillation: Secondary | ICD-10-CM | POA: Diagnosis not present

## 2016-06-04 DIAGNOSIS — Z79899 Other long term (current) drug therapy: Secondary | ICD-10-CM | POA: Insufficient documentation

## 2016-06-04 DIAGNOSIS — K219 Gastro-esophageal reflux disease without esophagitis: Secondary | ICD-10-CM | POA: Insufficient documentation

## 2016-06-04 DIAGNOSIS — Z9049 Acquired absence of other specified parts of digestive tract: Secondary | ICD-10-CM | POA: Diagnosis not present

## 2016-06-04 DIAGNOSIS — I10 Essential (primary) hypertension: Secondary | ICD-10-CM | POA: Diagnosis not present

## 2016-06-04 DIAGNOSIS — Z6832 Body mass index (BMI) 32.0-32.9, adult: Secondary | ICD-10-CM | POA: Insufficient documentation

## 2016-06-04 DIAGNOSIS — Z87891 Personal history of nicotine dependence: Secondary | ICD-10-CM | POA: Diagnosis not present

## 2016-06-04 DIAGNOSIS — R109 Unspecified abdominal pain: Secondary | ICD-10-CM | POA: Diagnosis not present

## 2016-06-04 DIAGNOSIS — R630 Anorexia: Secondary | ICD-10-CM | POA: Diagnosis not present

## 2016-06-04 DIAGNOSIS — I4819 Other persistent atrial fibrillation: Secondary | ICD-10-CM

## 2016-06-04 DIAGNOSIS — J8410 Pulmonary fibrosis, unspecified: Secondary | ICD-10-CM | POA: Diagnosis not present

## 2016-06-04 DIAGNOSIS — Z7901 Long term (current) use of anticoagulants: Secondary | ICD-10-CM | POA: Insufficient documentation

## 2016-06-04 LAB — BASIC METABOLIC PANEL
ANION GAP: 9 (ref 5–15)
BUN: 9 mg/dL (ref 6–20)
CALCIUM: 9.2 mg/dL (ref 8.9–10.3)
CO2: 25 mmol/L (ref 22–32)
Chloride: 103 mmol/L (ref 101–111)
Creatinine, Ser: 1.1 mg/dL (ref 0.61–1.24)
GFR calc Af Amer: >60 mL/min (ref >60)
GLUCOSE: 125 mg/dL — AB (ref 65–99)
Potassium: 3.5 mmol/L (ref 3.5–5.1)
SODIUM: 137 mmol/L (ref 135–145)

## 2016-06-04 LAB — MAGNESIUM: Magnesium: 2.1 mg/dL (ref 1.7–2.4)

## 2016-06-04 MED ORDER — RIVAROXABAN 20 MG PO TABS: 20.0000 mg | ORAL_TABLET | Freq: Every day | ORAL | Status: DC

## 2016-06-04 MED ORDER — DILTIAZEM HCL ER COATED BEADS 120 MG PO CP24: 120.0000 mg | ORAL_CAPSULE | Freq: Every day | ORAL | 3 refills | Status: DC

## 2016-06-04 NOTE — Patient Instructions (Signed)
Your physician has recommended you make the following change in your medication:  1)Stop Eliquis 2)Start Xarelto 3)Decrease cardizem to 120mg  once a day

## 2016-06-04 NOTE — Progress Notes (Addendum)
Primary Care Physician: Kathyrn Drown, MD Referring Physician: Dr. Jerolyn Center Birchmeier is a 71 y.o. male with a h/o of persistent afib in the atrial fibrillation clinic for evaluation.  H/o new onset since July of  by ECG at Dr Lance Sell office. History of COPD and chronic interstitial lung dx with dyspnea and fatigue Previously seen by cardiology 2013 and had normal myoview study with EF 61%.  Done for atypical chest pain. History of HTN and elevated lipids but Intolerant to statins. Is followed at [facility] for UIP and instertial lung disease .  He has a successful  cardioversion 12/5 which held pt in rhythm x 2 weeks. He saw a significant improvement in SR. He is on apixaban 5 mg bid and takes without fail.On initial visit, various antiarrythmic's were discussed with wife and pt and they wanted to look into costs. He was also on prednisone and levaquin at the time for UTI and wanted to finish. He checked prices on drugs and reviewed options again. He does want to try flecainide but will need a stress test since last test was 5 years ok and if ok will start drug right away. Stress test showed perfusion defect and Dt. Allred recommended avoiding flecainide for possibility of CAD. He was admitted to [facility] for tikosyn .  F/u's 2/22 after successful tikosyn load. He did convert without cardioversion. Qtc stayed stable. HCTZ was stopped and  he has picked up 7 lbs and has LLE.   He was started on lasix 20 mg a day and was asked to f/u in one week with bmet/amg and EKG. He was not losing weight fast enough so he doubled lasix to 40 mg a day. His qtc has prolonged and repeat  bmet/mag show a k+/ mag of 3.7/1.9 .otherwise is in SR.   Returns 3/2 for f/u ekg and labs. He held tikosyn x one dose last night and doubled up on K+ and mag. Yesterday and today. QTC is improved today and k+/mag are in normal range.  F/u 5/22 in afib clinic, he asked to be seen for nausea and abdominal pain.He says that this  has been ongoing since he started supplements for tikosyn to increase K+/mag levels but has gotten so bad that he went to the ER over the weekend. No findings in the ER. He occassionally will not take the mag/potassium and will have much more appetitive and no nausea or abdominal pain. He feels so bad that he wants to quit tikosyn so he can get off the supplements. He has been staying in SR but would rather be in afib than have the stomach issues he is having. He is scheduled to see GI tomorrow. Review of chart shows chronically elevated bilirubin.  F/u 5/29. Pt continues to have GI complaints. Stopped supplements+/mag las Friday and nauses is better but still has no appetitive and has last form 256 to 249. He has a weak stomach and feels his drugs are to blame. When GI saw him, the culprit was thought to be constipation but no tests were ordered. Pt is on chronic narcotics. He feels that his issues are from cardizem, eliquis or tikosyn. We discussed trying not to stop tikosyn at this point but he is interested in trying to change other meds. Bmet/mag pending.  Today, he denies symptoms of palpitations,  orthopnea, PND, lower extremity edema, dizziness, presyncope, syncope, or neurologic sequela.  Positive for stomach discomfort, constipation, indigestion, weight loss, anorexia.The patient is tolerating medications without  difficulties and is otherwise without complaint today.   Past Medical History:  Diagnosis Date  . Arthritis   . Asthma   . Atrial fibrillation ([place])   . Cervical spondylosis   . Chest pain    Emergency room October 24, 2011, no MI  //   Nuclear, October, 2013, adenosine, EF 61%, no scar or ischemia  . Dyslipidemia    Statin intolerant  . GERD (gastroesophageal reflux disease)   . Glaucoma   . Hearing loss   . Hypertension   . Idiopathic pulmonary fibrosis ([place])   . Interstitial pulmonary fibrosis ([place]) 2012  . Left shoulder pain 2011  . Psoriasis   . Statin intolerance     Past Surgical History:  Procedure Laterality Date  . BRAIN SURGERY    . CARDIOVERSION N/A 12/13/2015   Procedure: CARDIOVERSION;  Surgeon: Josue Hector, MD;  Location: [facility];  Service: Cardiovascular;  Laterality: N/A;  . CATARACT EXTRACTION W/PHACO Right 06/11/2012   Procedure: CATARACT EXTRACTION PHACO AND INTRAOCULAR LENS PLACEMENT ([place]);  Surgeon: Tonny Branch, MD;  Location: [facility];  Service: Ophthalmology;  Laterality: Right;  CDE:  14.43  . CATARACT EXTRACTION W/PHACO Left 06/29/2012   Procedure: CATARACT EXTRACTION PHACO AND INTRAOCULAR LENS PLACEMENT (IOC);  Surgeon: Tonny Branch, MD;  Location: [facility];  Service: Ophthalmology;  Laterality: Left;  CDE:14.11  . CHOLECYSTECTOMY  05/08/2012  . CHOLECYSTECTOMY N/A 05/08/2012   Procedure: LAPAROSCOPIC CHOLECYSTECTOMY;  Surgeon: Harl Bowie, MD;  Location: [facility];  Service: General;  Laterality: N/A;  . COLONOSCOPY    . LIVER BIOPSY N/A 05/08/2012   Procedure: LIVER BIOPSY;  Surgeon: Harl Bowie, MD;  Location: [facility];  Service: General;  Laterality: N/A;  . NECK SURGERY     cervical disc with cliip    Current Outpatient Prescriptions  Medication Sig Dispense Refill  . albuterol (PROVENTIL HFA;VENTOLIN HFA) 108 (90 Base) MCG/ACT inhaler Inhale 2 puffs into the lungs every 6 (six) hours as needed for wheezing. 1 Inhaler 2  . diltiazem (CARDIZEM CD) 120 MG 24 hr capsule Take 1 capsule (120 mg total) by mouth daily. 30 capsule 3  . dofetilide (TIKOSYN) 125 MCG capsule Take 1 capsule (125 mcg total) by mouth 2 (two) times daily. Take along with 259mcg twice a day to total 353mcg 60 capsule 3  . dofetilide (TIKOSYN) 250 MCG capsule Take 1 capsule (250 mcg total) by mouth 2 (two) times daily. Take along with 163mcg twice a day to total 375mg  60 capsule 6  . fluticasone (FLONASE) 50 MCG/ACT nasal spray Place 2 sprays into both nostrils daily. 16 g 5  . furosemide (LASIX) 40 MG tablet Take 1 tablet (40 mg total) by mouth daily. 30  tablet 3  . HYDROcodone-acetaminophen (NORCO) 10-325 MG tablet Take 1 tablet by mouth 3 (three) times daily as needed. 90 tablet 0  . Magnesium 200 MG TABS Take 1 tablet (200 mg total) by mouth daily. 30 each   . omeprazole (PRILOSEC) 20 MG capsule Take 20 mg by mouth daily as needed (heartburn/indigestion). Reported on 07/21/2015    . ondansetron (ZOFRAN) 4 MG tablet Take 1 tablet (4 mg total) by mouth every 6 (six) hours. 8 tablet 0  . potassium chloride SA (K-DUR,KLOR-CON) 20 MEQ tablet Take 1 tablet (20 mEq total) by mouth at bedtime. (Patient not taking: Reported on 06/04/2016) 60 tablet 6  . rivaroxaban (XARELTO) 20 MG TABS tablet Take 1 tablet (20 mg total) by mouth daily with supper. [place]  tablet   . zolpidem (AMBIEN) 10 MG tablet Take 1 tablet (10 mg total) by mouth at bedtime as needed for sleep. 30 tablet 3   No current facility-administered medications for this encounter.     Allergies  Allergen Reactions  . Pirfenidone     Elevated bilirubin  . Acyclovir And Related   . Oxycodone Itching  . Pravastatin Other (See Comments)    Chills    . Simvastatin Other (See Comments)    Chills    . Prednisone Anxiety    Irritation  . Tegretol [Carbamazepine] Other (See Comments)    dizziness    Social History   Social History  . Marital status: Married    Spouse name: N/A  . Number of children: N/A  . Years of education: N/A   Occupational History  . Not on file.   Social History Main Topics  . Smoking status: Former Smoker    Packs/day: 0.50    Years: 52.00    Types: Cigarettes    Quit date: 09/08/2003  . Smokeless tobacco: Never Used  . Alcohol use No     Comment: quit drinking 2004  . Drug use: No  . Sexual activity: Yes    Birth control/ protection: None   Other Topics Concern  . Not on file   Social History Narrative  . No narrative on file    Family History  Problem Relation Age of Onset  . Hypertension Mother   . Congestive Heart Failure Mother   . COPD  Mother   . Stroke Father   . Cancer Sister        breast    ROS- All systems are reviewed and negative except as per the HPI above  Physical Exam: Vitals:   06/04/16 0918  BP: 108/72  Pulse: 87  Weight: 249 lb 6.4 oz (113.1 kg)  Height: 6\' 1"  (1.854 m)   Wt Readings from Last 3 Encounters:  06/04/16 249 lb 6.4 oz (113.1 kg)  05/29/16 256 lb 12.8 oz (116.5 kg)  05/28/16 257 lb (116.6 kg)    Labs: Lab Results  Component Value Date   NA 137 05/26/2016   K 4.2 05/26/2016   CL 104 05/26/2016   CO2 23 05/26/2016   GLUCOSE 100 (H) 05/26/2016   BUN 12 05/26/2016   CREATININE 0.97 05/26/2016   CALCIUM 9.0 05/26/2016   MG 2.0 04/16/2016   Lab Results  Component Value Date   INR 1.1 12/12/2015   Lab Results  Component Value Date   CHOL 145 06/13/2015   HDL 33 (L) 06/13/2015   LDLCALC 92 06/13/2015   TRIG 101 06/13/2015     GEN- The patient is well appearing, alert and oriented x 3 today.   Head- normocephalic, atraumatic Eyes-  Sclera clear, conjunctiva pink Ears- hearing intact Oropharynx- clear Neck- supple, no JVP Lymph- no cervical lymphadenopathy Lungs- Clear to ausculation bilaterally, normal work of breathing Heart- regular rate and rhythm, no murmurs, rubs or gallops, PMI not laterally displaced GI- soft, NT, ND, + BS Extremities- no clubbing, cyanosis, or  Trace to 1+ pitting edema MS- no significant deformity or atrophy Skin- no rash or lesion Psych- euthymic mood, full affect Neuro- strength and sensation are intact  EKG- NSR at 87 bpm, pr int 134 ms, qrs int 78 ms, qtc 464 ms Epic records reviewed   Assessment and Plan: 1. Persistent symptomatic afib Maintainig SR since starting Tikosyn  but is having abdominal pain/ nausea/anorexia/weight loss and believes  it is from magnesium and potassium supplements, which he stopped. Nausea has improved but he has continued anorexia/weight loss. Bmet/mag pending Decrease cardizem to 120 mg qd Will try to  change over to xarelto 20 with crcl cal at 109.97, can start today at dinner with food. But he is adamant if he sees no improvement he wants to stop cardiac meds He will call on Friday with results of above changes  Will get f/u appointment with Dr. Curt Bears Labs back and show K+ at 3.5, pt adamant that he does not want to go back on K+. I discussed with Dr. Curt Bears and will stop tikosyn because we cannot control K+ at higher levels without supplement, making him at risk for proarrhythmias. He will f/u with Dr. Curt Bears next week. He understands that stopping Tikosyn will encourage return of afib.  Geroge Baseman Saniyya Gau, [facility] [address] [place], [place] [address] [phone]

## 2016-06-04 NOTE — Addendum Note (Signed)
Encounter addended by: Sherran Needs, NP on: 06/04/2016 11:58 AM<BR>    Actions taken: Sign clinical note

## 2016-06-05 ENCOUNTER — Ambulatory Visit (HOSPITAL_COMMUNITY): Payer: Medicare Other | Admitting: Nurse Practitioner

## 2016-06-07 ENCOUNTER — Telehealth (HOSPITAL_COMMUNITY): Payer: Self-pay | Admitting: *Deleted

## 2016-06-07 NOTE — Telephone Encounter (Signed)
Pt called in with report of how he is feeling since stopping tikosyn (along with all supplements), decreasing cardizem and changing to xarelto. Pt states he may see a "slight" improvement but still intermittently queasy. He is taking both cardizem and xarelto at night with supper. Pt states so far HR has stayed normal. Follows up with Dr. Curt Bears next week.

## 2016-06-10 NOTE — Telephone Encounter (Signed)
Left message to return call 

## 2016-06-11 ENCOUNTER — Ambulatory Visit: Payer: PPO | Admitting: Family Medicine

## 2016-06-11 ENCOUNTER — Encounter: Payer: Self-pay | Admitting: Cardiology

## 2016-06-11 ENCOUNTER — Ambulatory Visit (INDEPENDENT_AMBULATORY_CARE_PROVIDER_SITE_OTHER): Payer: PPO | Admitting: Cardiology

## 2016-06-11 ENCOUNTER — Encounter: Payer: Medicare HMO | Admitting: Family Medicine

## 2016-06-11 VITALS — BP 112/76 | HR 78 | Ht 73.25 in | Wt 245.4 lb

## 2016-06-11 DIAGNOSIS — I481 Persistent atrial fibrillation: Secondary | ICD-10-CM | POA: Diagnosis not present

## 2016-06-11 DIAGNOSIS — I10 Essential (primary) hypertension: Secondary | ICD-10-CM | POA: Diagnosis not present

## 2016-06-11 DIAGNOSIS — I4819 Other persistent atrial fibrillation: Secondary | ICD-10-CM

## 2016-06-11 NOTE — Progress Notes (Signed)
Electrophysiology Office Note   Date:  06/11/2016   ID:  Curtis [name], DOB 1945/02/07, MRN [id number]  PCP:  Kathyrn Drown, MD  Cardiologist:  Johnsie Cancel Primary Electrophysiologist:  Sylvana Bonk Meredith Leeds, MD    Chief Complaint  Patient presents with  . Follow-up    Persistent Afib     History of Present Illness: Curtis [name] is a 71 y.o. male who is being seen today for the evaluation of atrial fibillation at the request of Luking, Elayne Snare, MD. Presenting today for electrophysiology evaluation. He has a history of persistent atrial fibrillation status post Tikosyn loading, hypertension, hyperlipidemia, COPD and interstitial lung disease. His Tikosyn load was on 02/22/16.  He has done well. Unfortunately he has gained 8 pounds since being in the hospital. He feels like this is mostly fluid weight, although it could also be due to constipation.   Today, denies symptoms of palpitations, chest pain, shortness of breath, orthopnea, PND, lower extremity edema, claudication, dizziness, presyncope, syncope, bleeding, or neurologic sequela.  He's been having significant GI complaints. He feels that it is related to his Tikosyn, magnesium, and potassium. He has stopped those medications. He has been having significant fatigue and weakness. He is also had anorexia. He has lost 20 pounds. Since stopping his Tikosyn, potassium, and magnesium, his appetite has improved, though it is not back to normal.   Past Medical History:  Diagnosis Date  . Arthritis   . Asthma   . Atrial fibrillation ([place])   . Cervical spondylosis   . Chest pain    Emergency room October 24, 2011, no MI  //   Nuclear, October, 2013, adenosine, EF 61%, no scar or ischemia  . Dyslipidemia    Statin intolerant  . GERD (gastroesophageal reflux disease)   . Glaucoma   . Hearing loss   . Hypertension   . Idiopathic pulmonary fibrosis ([place])   . Interstitial pulmonary fibrosis ([place]) 2012  . Left shoulder pain 2011    . Psoriasis   . Statin intolerance    Past Surgical History:  Procedure Laterality Date  . BRAIN SURGERY    . CARDIOVERSION N/A 12/13/2015   Procedure: CARDIOVERSION;  Surgeon: Josue Hector, MD;  Location: [facility];  Service: Cardiovascular;  Laterality: N/A;  . CATARACT EXTRACTION W/PHACO Right 06/11/2012   Procedure: CATARACT EXTRACTION PHACO AND INTRAOCULAR LENS PLACEMENT ([place]);  Surgeon: Tonny Branch, MD;  Location: [facility];  Service: Ophthalmology;  Laterality: Right;  CDE:  14.43  . CATARACT EXTRACTION W/PHACO Left 06/29/2012   Procedure: CATARACT EXTRACTION PHACO AND INTRAOCULAR LENS PLACEMENT ([facility]);  Surgeon: Tonny Branch, MD;  Location: [facility];  Service: Ophthalmology;  Laterality: Left;  CDE:14.11  . CHOLECYSTECTOMY  05/08/2012  . CHOLECYSTECTOMY N/A 05/08/2012   Procedure: LAPAROSCOPIC CHOLECYSTECTOMY;  Surgeon: Harl Bowie, MD;  Location: [facility];  Service: General;  Laterality: N/A;  . COLONOSCOPY    . LIVER BIOPSY N/A 05/08/2012   Procedure: LIVER BIOPSY;  Surgeon: Harl Bowie, MD;  Location: [facility];  Service: General;  Laterality: N/A;  . NECK SURGERY     cervical disc with cliip     Current Outpatient Prescriptions  Medication Sig Dispense Refill  . albuterol (PROVENTIL HFA;VENTOLIN HFA) 108 (90 Base) MCG/ACT inhaler Inhale 2 puffs into the lungs every 6 (six) hours as needed for wheezing. 1 Inhaler 2  . diltiazem (CARDIZEM CD) 120 MG 24 hr capsule Take 1 capsule (120 mg total) by mouth daily. 30 capsule 3  .  fluticasone (FLONASE) 50 MCG/ACT nasal spray Place 2 sprays into both nostrils daily. 16 g 5  . furosemide (LASIX) 40 MG tablet Take 1 tablet (40 mg total) by mouth daily. 30 tablet 3  . HYDROcodone-acetaminophen (NORCO) 10-325 MG tablet Take 1 tablet by mouth 3 (three) times daily as needed. 90 tablet 0  . omeprazole (PRILOSEC) 20 MG capsule Take 20 mg by mouth daily as needed (heartburn/indigestion). Reported on 07/21/2015    . rivaroxaban (XARELTO) 20 MG  TABS tablet Take 1 tablet (20 mg total) by mouth daily with supper. 30 tablet    No current facility-administered medications for this visit.     Allergies:   Pirfenidone; Acyclovir and related; Oxycodone; Pravastatin; Simvastatin; Prednisone; and Tegretol [carbamazepine]   Social History:  The patient  reports that he quit smoking about 12 years ago. His smoking use included Cigarettes. He has a 26.00 pack-year smoking history. He has never used smokeless tobacco. He reports that he does not drink alcohol or use drugs.   Family History:  The patient's family history includes COPD in his mother; Cancer in his sister; Congestive Heart Failure in his mother; Hypertension in his mother; Stroke in his father.    ROS:  Please see the history of present illness.   Otherwise, review of systems is positive for appetite change, chills, fatigue, shortness of breath, abdominal pain, wheezing, constipation, nausea, balance problems, back pain, muscle pain, dizziness, easy bruising, headaches.   All other systems are reviewed and negative.     PHYSICAL EXAM: VS:  BP 112/76   Pulse 78   Ht 6' 1.25" (1.861 m)   Wt 245 lb 6.4 oz (111.3 kg)   BMI 32.16 kg/m  , BMI Body mass index is 32.16 kg/m. GEN: Well nourished, well developed, in no acute distress  HEENT: normal  Neck: no JVD, carotid bruits, or masses Cardiac: RRR; no murmurs, rubs, or gallops,no edema  Respiratory:  clear to auscultation bilaterally, normal work of breathing GI: soft, nontender, nondistended, + BS MS: no deformity or atrophy  Skin: warm and dry Neuro:  Strength and sensation are intact Psych: euthymic mood, full affect  EKG:  EKG is not ordered today. Personal review of the ekg ordered 06/04/16 shows sinus rhythm, LVH by voltage, nonspecific ST/T wave changes   Recent Labs: 05/26/2016: ALT 20; B Natriuretic Peptide 28.4; Hemoglobin 14.4; Platelets 115 06/04/2016: BUN 9; Creatinine, Ser 1.10; Magnesium 2.1; Potassium 3.5;  Sodium 137    Lipid Panel     Component Value Date/Time   CHOL 145 06/13/2015 0728   TRIG 101 06/13/2015 0728   HDL 33 (L) 06/13/2015 0728   CHOLHDL 4.4 06/13/2015 0728   VLDL 20 06/13/2015 0728   LDLCALC 92 06/13/2015 0728     Wt Readings from Last 3 Encounters:  06/11/16 245 lb 6.4 oz (111.3 kg)  06/04/16 249 lb 6.4 oz (113.1 kg)  05/29/16 256 lb 12.8 oz (116.5 kg)      Other studies Reviewed: Additional studies/ records that were reviewed today include: TTE 11/23/15  Review of the above records today demonstrates:  -Left ventricle: The cavity size was normal. Wall thickness was   normal. The estimated ejection fraction was 55%. Although no   diagnostic regional wall motion abnormality was identified, this   possibility cannot be completely excluded on the basis of this   study. - Aortic valve: Moderately calcified annulus. Trileaflet; mildly   calcified leaflets. Cusp separation was reduced. Aortic valve was   not completely  interrogated. There looks to be mild aortic   stenosis. Valve area by planimetry 1.8 cm^2. - Mitral valve: Calcified annulus. Mildly thickened, mildly   calcified leaflets . There was mild regurgitation. - Left atrium: The atrium was moderately dilated. - Right atrium: The atrium was mildly dilated. Central venous   pressure (est): 3 mm Hg. - Atrial septum: No defect or patent foramen ovale was identified. - Tricuspid valve: There was trivial regurgitation. - Pulmonary arteries: PA peak pressure: 30 mm Hg (S). - Pericardium, extracardiac: There was no pericardial effusion.   SPECT 02/05/16  There was no ST segment deviation noted during stress.  Defect 1: There is a small defect of mild severity present in the basal inferolateral and mid inferolateral location.  This is a low risk study.   Low risk stress nuclear study with a small fixed inferolateral defect. Ischemia is not seen.  ASSESSMENT AND PLAN:  1.  Persistent atrial  fibrillation:  Has had significant issues with anorexia, fatigue, and weight loss. He is stopped his potassium, magnesium, and Tikosyn, and has also had his Eliquis switch to Xarelto. I did discuss with him further options of A. fib therapy including ablation and sotalol loading. We'll plan to wait until he recovers further from his anorexia and fatigue.  This patients CHA2DS2-VASc Score and unadjusted Ischemic Stroke Rate (% per year) is equal to 2.2 % stroke rate/year from a score of 2  Above score calculated as 1 point each if present [CHF, HTN, DM, Vascular=MI/PAD/Aortic Plaque, Age if 65-74, or Male] Above score calculated as 2 points each if present [Age > 75, or Stroke/TIA/TE]   2. Hypertension: Well-controlled today. No changes at this time.  3. Lower extremity edema: Has significantly improved. No medication changes.  Current medicines are reviewed at length with the patient today.   The patient does not have concerns regarding his medicines.  The following changes were made today:  none  Labs/ tests ordered today include:  No orders of the defined types were placed in this encounter.    Disposition:   FU with Tyronica [place] 2 months  Signed, Karrin Eisenmenger Meredith Leeds, MD  06/11/2016 11:28 AM     [facility] [address] [place] [address] [phone] (office) [phone] (fax)

## 2016-06-11 NOTE — Patient Instructions (Signed)
Medication Instructions:    Your physician recommends that you continue on your current medications as directed. Please refer to the Current Medication list given to you today.  - If you need a refill on your cardiac medications before your next appointment, please call your pharmacy.   Labwork:  None ordered  Testing/Procedures:  None ordered  Follow-Up:  Your physician recommends that you schedule a follow-up appointment in: 2 months with Dr. Curt Bears  Thank you for choosing [facility]!!   Trinidad Curet, RN [phone]

## 2016-06-13 NOTE — Telephone Encounter (Signed)
Patient has follow up appointment scheduled with Dr Nicki Reaper 06/17/16

## 2016-06-17 ENCOUNTER — Encounter: Payer: Self-pay | Admitting: Family Medicine

## 2016-06-17 ENCOUNTER — Telehealth (HOSPITAL_COMMUNITY): Payer: Self-pay | Admitting: *Deleted

## 2016-06-17 ENCOUNTER — Ambulatory Visit (INDEPENDENT_AMBULATORY_CARE_PROVIDER_SITE_OTHER): Payer: PPO | Admitting: Family Medicine

## 2016-06-17 VITALS — BP 114/70 | Temp 98.1°F | Ht 73.25 in | Wt 248.4 lb

## 2016-06-17 DIAGNOSIS — D509 Iron deficiency anemia, unspecified: Secondary | ICD-10-CM

## 2016-06-17 DIAGNOSIS — R17 Unspecified jaundice: Secondary | ICD-10-CM

## 2016-06-17 DIAGNOSIS — R634 Abnormal weight loss: Secondary | ICD-10-CM

## 2016-06-17 DIAGNOSIS — Z125 Encounter for screening for malignant neoplasm of prostate: Secondary | ICD-10-CM | POA: Diagnosis not present

## 2016-06-17 DIAGNOSIS — R6881 Early satiety: Secondary | ICD-10-CM

## 2016-06-17 DIAGNOSIS — R531 Weakness: Secondary | ICD-10-CM

## 2016-06-17 DIAGNOSIS — K219 Gastro-esophageal reflux disease without esophagitis: Secondary | ICD-10-CM | POA: Diagnosis not present

## 2016-06-17 DIAGNOSIS — I10 Essential (primary) hypertension: Secondary | ICD-10-CM | POA: Diagnosis not present

## 2016-06-17 NOTE — Progress Notes (Signed)
   Subjective:    Patient ID: Curtis [name], male    DOB: 04-18-1945, 71 y.o.   MRN: [id number]  Constipation  This is a new problem. The current episode started in the past 7 days. Associated symptoms include nausea. Associated symptoms comments: Weakness.  This patient is up-to-date on his colonoscopy. He has bowel movements every several days states her large-caliber he is trying to eat more fiber  Epigastric and reflux symptoms Feels chiiled but not shaking Losing some weight Appetite poor Patient has c/o nausea, and constipation. Patient states had a bowel movement today but had not had one in 4 days.  UTD colonoscopy Patient does relate that he's been having significant nausea in addition to this he relates when he eats he gets a full feeling quickly He relates esophageal pain and epigastric pain He does have a history of cirrhosis He states a different specialist told him everything was okay with his liver Patient relates fatigue tiredness losing weight low energy Denies rectal bleeding denies vomiting, no high fever chills no sweats no cough wheezing or difficulty breathing no chest pain Review of Systems  Gastrointestinal: Positive for constipation and nausea.     Objective:   Physical Exam  Constitutional: He appears well-nourished. No distress.  Cardiovascular: Normal rate, regular rhythm and normal heart sounds.   No murmur heard. Pulmonary/Chest: Effort normal and breath sounds normal. No respiratory distress.  Abdominal: Soft. He exhibits no distension. There is no tenderness.  Musculoskeletal: He exhibits no edema.  Lymphadenopathy:    He has no cervical adenopathy.  Neurological: He is alert.  Psychiatric: His behavior is normal.  Vitals reviewed.  His weight loss is noted. No suspicious findings on physical exam   patient was recently referred to Dr. Henrene Pastor because he wanted all of his specialist to be at the same place but Dr. Henrene Pastor recommended for him to  go back to his original gastroenterologist Dr. Amedeo Plenty    CT scan report reviewed     Assessment & Plan:  -weight loss with mild esophageal symptoms and early satiety-this is concerning. I leave the patient needs EGD. Referral to Dr. Amedeo Plenty.   Nausea with weight loss-needs lab work to look at liver function kidney function A1c as well as CBC Also needs PSA screening Because patient is been having some aches pains and discomforts will check sedimentation rate to make sure patient does not have polymyalgia rheumatica  Patient will need close follow-up

## 2016-06-17 NOTE — Progress Notes (Deleted)
Abd

## 2016-06-17 NOTE — Telephone Encounter (Signed)
Pt cld reporting heartburn with the xarelto and would like to switch back to the Eliquis 5 mg bid to see if this reduces the heartburn.  Pt reminded to take eliquis twice a day and pt voiced understanding. He will call us with an update later this week.

## 2016-06-18 ENCOUNTER — Encounter: Payer: Self-pay | Admitting: Family Medicine

## 2016-06-18 DIAGNOSIS — R531 Weakness: Secondary | ICD-10-CM | POA: Diagnosis not present

## 2016-06-18 DIAGNOSIS — R6881 Early satiety: Secondary | ICD-10-CM | POA: Diagnosis not present

## 2016-06-18 DIAGNOSIS — R634 Abnormal weight loss: Secondary | ICD-10-CM | POA: Diagnosis not present

## 2016-06-18 DIAGNOSIS — Z125 Encounter for screening for malignant neoplasm of prostate: Secondary | ICD-10-CM | POA: Diagnosis not present

## 2016-06-18 DIAGNOSIS — D509 Iron deficiency anemia, unspecified: Secondary | ICD-10-CM | POA: Diagnosis not present

## 2016-06-18 DIAGNOSIS — I10 Essential (primary) hypertension: Secondary | ICD-10-CM | POA: Diagnosis not present

## 2016-06-18 DIAGNOSIS — R17 Unspecified jaundice: Secondary | ICD-10-CM | POA: Diagnosis not present

## 2016-06-19 LAB — BASIC METABOLIC PANEL
BUN/Creatinine Ratio: 8 — ABNORMAL LOW (ref 10–24)
BUN: 7 mg/dL — ABNORMAL LOW (ref 8–27)
CO2: 22 mmol/L (ref 20–29)
CREATININE: 0.93 mg/dL (ref 0.76–1.27)
Calcium: 8.9 mg/dL (ref 8.6–10.2)
Chloride: 107 mmol/L — ABNORMAL HIGH (ref 96–106)
GFR, EST AFRICAN AMERICAN: 94 mL/min/{1.73_m2} (ref >59)
GFR, EST NON AFRICAN AMERICAN: 82 mL/min/{1.73_m2} (ref >59)
Glucose: 100 mg/dL — ABNORMAL HIGH (ref 65–99)
Potassium: 3.8 mmol/L (ref 3.5–5.2)
SODIUM: 141 mmol/L (ref 134–144)

## 2016-06-20 LAB — CBC WITH DIFFERENTIAL/PLATELET
BASOS: 0 %
Basophils Absolute: 0 10*3/uL (ref 0.0–0.2)
EOS (ABSOLUTE): 0.4 10*3/uL (ref 0.0–0.4)
EOS: 5 %
HEMOGLOBIN: 14 g/dL (ref 13.0–17.7)
Hematocrit: 43.3 % (ref 37.5–51.0)
IMMATURE GRANS (ABS): 0 10*3/uL (ref 0.0–0.1)
Immature Granulocytes: 0 %
LYMPHS ABS: 2.4 10*3/uL (ref 0.7–3.1)
LYMPHS: 30 %
MCH: 31.1 pg (ref 26.6–33.0)
MCHC: 32.3 g/dL (ref 31.5–35.7)
MCV: 96 fL (ref 79–97)
MONOCYTES: 11 %
Monocytes Absolute: 0.9 10*3/uL (ref 0.1–0.9)
NEUTROS ABS: 4.2 10*3/uL (ref 1.4–7.0)
NEUTROS PCT: 54 %
Platelets: 190 10*3/uL (ref 150–379)
RBC: 4.5 x10E6/uL (ref 4.14–5.80)
RDW: 13.2 % (ref 12.3–15.4)
WBC: 7.8 10*3/uL (ref 3.4–10.8)

## 2016-06-20 LAB — IRON AND TIBC
Iron Saturation: 35 % (ref 15–55)
Iron: 83 ug/dL (ref 38–169)
TIBC: 239 ug/dL — AB (ref 250–450)
UIBC: 156 ug/dL (ref 111–343)

## 2016-06-20 LAB — HEMOGLOBIN A1C
ESTIMATED AVERAGE GLUCOSE: 100 mg/dL
HEMOGLOBIN A1C: 5.1 % (ref 4.8–5.6)

## 2016-06-20 LAB — SEDIMENTATION RATE: Sed Rate: 34 mm/hr — ABNORMAL HIGH (ref 0–30)

## 2016-06-20 LAB — HEPATIC FUNCTION PANEL
ALK PHOS: 82 IU/L (ref 39–117)
ALT: 13 IU/L (ref 0–44)
AST: 36 IU/L (ref 0–40)
Albumin: 3.6 g/dL (ref 3.5–4.8)
Bilirubin Total: 1.4 mg/dL — ABNORMAL HIGH (ref 0.0–1.2)
Bilirubin, Direct: 0.35 mg/dL (ref 0.00–0.40)
Total Protein: 6.6 g/dL (ref 6.0–8.5)

## 2016-06-20 LAB — PSA: Prostate Specific Ag, Serum: 0.9 ng/mL (ref 0.0–4.0)

## 2016-06-20 LAB — FERRITIN: FERRITIN: 126 ng/mL (ref 30–400)

## 2016-06-26 DIAGNOSIS — R110 Nausea: Secondary | ICD-10-CM | POA: Diagnosis not present

## 2016-06-26 DIAGNOSIS — R634 Abnormal weight loss: Secondary | ICD-10-CM | POA: Diagnosis not present

## 2016-06-26 DIAGNOSIS — R12 Heartburn: Secondary | ICD-10-CM | POA: Diagnosis not present

## 2016-06-28 DIAGNOSIS — R110 Nausea: Secondary | ICD-10-CM | POA: Diagnosis not present

## 2016-06-28 DIAGNOSIS — K2930 Chronic superficial gastritis without bleeding: Secondary | ICD-10-CM | POA: Diagnosis not present

## 2016-06-28 DIAGNOSIS — R6881 Early satiety: Secondary | ICD-10-CM | POA: Diagnosis not present

## 2016-06-28 DIAGNOSIS — K2970 Gastritis, unspecified, without bleeding: Secondary | ICD-10-CM | POA: Diagnosis not present

## 2016-06-28 DIAGNOSIS — R634 Abnormal weight loss: Secondary | ICD-10-CM | POA: Diagnosis not present

## 2016-07-02 ENCOUNTER — Ambulatory Visit: Payer: Medicare Other | Admitting: Cardiology

## 2016-07-03 DIAGNOSIS — K2930 Chronic superficial gastritis without bleeding: Secondary | ICD-10-CM | POA: Diagnosis not present

## 2016-07-05 DIAGNOSIS — J8410 Pulmonary fibrosis, unspecified: Secondary | ICD-10-CM | POA: Diagnosis not present

## 2016-07-12 ENCOUNTER — Other Ambulatory Visit (HOSPITAL_COMMUNITY): Payer: Self-pay | Admitting: *Deleted

## 2016-07-12 MED ORDER — APIXABAN 5 MG PO TABS: 5.0000 mg | ORAL_TABLET | Freq: Two times a day (BID) | ORAL | 2 refills | Status: DC

## 2016-07-22 ENCOUNTER — Telehealth: Payer: Self-pay | Admitting: Family Medicine

## 2016-07-22 NOTE — Telephone Encounter (Signed)
Patient is requesting refill on hydrocodone 10/325 °

## 2016-07-22 NOTE — Telephone Encounter (Addendum)
Last pain management 04/2016- was given 3 scripts

## 2016-07-23 MED ORDER — HYDROCODONE-ACETAMINOPHEN 10-325 MG PO TABS: 1.0000 | ORAL_TABLET | Freq: Three times a day (TID) | ORAL | 0 refills | Status: DC | PRN

## 2016-07-23 NOTE — Telephone Encounter (Signed)
Prescription up front for pick up . Patient notified and advised he will need a pain management visit for further refills

## 2016-07-23 NOTE — Telephone Encounter (Signed)
He may have one additional prescription but he needs to know he needs to come in for a standard office visit for pain management this is required by federal law every 3 months. Find out from the pharmacy when he got his last prescription and date accordingly

## 2016-07-29 ENCOUNTER — Other Ambulatory Visit (HOSPITAL_COMMUNITY): Payer: Self-pay | Admitting: *Deleted

## 2016-07-29 MED ORDER — APIXABAN 5 MG PO TABS: 5.0000 mg | ORAL_TABLET | Freq: Two times a day (BID) | ORAL | 11 refills | Status: DC

## 2016-08-04 DIAGNOSIS — J8410 Pulmonary fibrosis, unspecified: Secondary | ICD-10-CM | POA: Diagnosis not present

## 2016-08-08 ENCOUNTER — Other Ambulatory Visit (HOSPITAL_COMMUNITY): Payer: Self-pay | Admitting: *Deleted

## 2016-08-08 MED ORDER — APIXABAN 5 MG PO TABS: 5.0000 mg | ORAL_TABLET | Freq: Two times a day (BID) | ORAL | 6 refills | Status: DC

## 2016-08-12 ENCOUNTER — Encounter: Payer: Self-pay | Admitting: Cardiology

## 2016-08-22 ENCOUNTER — Encounter: Payer: Self-pay | Admitting: Family Medicine

## 2016-08-22 ENCOUNTER — Ambulatory Visit (INDEPENDENT_AMBULATORY_CARE_PROVIDER_SITE_OTHER): Payer: PPO | Admitting: Family Medicine

## 2016-08-22 VITALS — BP 102/64 | Temp 97.7°F | Ht 73.25 in | Wt 240.0 lb

## 2016-08-22 DIAGNOSIS — R634 Abnormal weight loss: Secondary | ICD-10-CM | POA: Diagnosis not present

## 2016-08-22 DIAGNOSIS — J8410 Pulmonary fibrosis, unspecified: Secondary | ICD-10-CM | POA: Diagnosis not present

## 2016-08-22 DIAGNOSIS — J849 Interstitial pulmonary disease, unspecified: Secondary | ICD-10-CM | POA: Diagnosis not present

## 2016-08-22 DIAGNOSIS — R5383 Other fatigue: Secondary | ICD-10-CM

## 2016-08-22 MED ORDER — PREDNISONE 20 MG PO TABS: ORAL_TABLET | ORAL | 0 refills | Status: DC

## 2016-08-22 MED ORDER — DOXYCYCLINE HYCLATE 100 MG PO CAPS: 100.0000 mg | ORAL_CAPSULE | Freq: Two times a day (BID) | ORAL | 0 refills | Status: DC

## 2016-08-22 MED ORDER — HYDROCODONE-ACETAMINOPHEN 10-325 MG PO TABS: 1.0000 | ORAL_TABLET | Freq: Three times a day (TID) | ORAL | 0 refills | Status: DC | PRN

## 2016-08-22 NOTE — Progress Notes (Signed)
   Subjective:    Patient ID: Curtis [name], male    DOB: [birth year], 71 y.o.   MRN: [id number]  HPIcough. Has it a couple times a year. Pt states it is from scar tissue on his lungs. Shortness of breath, wheezing, coughing up white/yellow mucus. Not tried any treatments.  This patient relates a lot of coughing shortness of breath chest congestion wheezing symptoms been going on daily he's had lung issues for several years followed by pulmonology at [facility] and also followed by pulmonology at [facility] now having significant coughing congestion wheezing bringing up yellow phlegm denies high fever chills sweats  He is having a lot of fatigue tiredness and continued weight loss. Patient does not smoke any further. Patient denies rectal bleeding had GI referral recently along with EGD and biopsies is had bloodwork along with a CT scan of the abdomen none of this shown a reason for his weight loss.   Review of Systems  Constitutional: Positive for fatigue and unexpected weight change. Negative for activity change and fever.  HENT: Positive for congestion and rhinorrhea. Negative for ear pain.   Eyes: Negative for discharge.  Respiratory: Positive for cough, shortness of breath and wheezing.   Cardiovascular: Negative for chest pain.  Gastrointestinal: Negative for abdominal pain.  Musculoskeletal: Positive for arthralgias.  Neurological: Positive for weakness.       Objective:   Physical Exam  Constitutional: He appears well-developed.  HENT:  Head: Normocephalic.  Mouth/Throat: Oropharynx is clear and moist. No oropharyngeal exudate.  Neck: Normal range of motion.  Cardiovascular: Normal rate and normal heart sounds.   No murmur heard. Pulmonary/Chest: Effort normal. He has wheezes. He has rales.  Lymphadenopathy:    He has no cervical adenopathy.  Neurological: He exhibits normal muscle tone.  Skin: Skin is warm and dry.  Nursing note and vitals reviewed.  Previous CT scan of the  chest from [place] back in October 2017 was reviewed chest x-ray was reviewed and may  Patient with severe chest congestion crackles along with rails wheezing slightly labored breathing but does not appear in distress Patient's weight is down significantly over the past several months. 25 minutes was spent with the patient. Greater than half the time was spent in discussion and answering questions and counseling regarding the issues that the patient came in for today.     Assessment & Plan:  Weight loss it is possible some of this could be related to his pulmonary functions in having to breathe harder but we need to make sure with his abnormal breath sounds abnormal chest x-ray may at he is not developing into any type of severe bronchiectasis or abscess or possibility of tumor therefore we will do a CT scan of the chest  Referral back to pulmonary for further evaluation  Prednisone taper prescribed for the patient as well as antibiotics to cover for infection  Nature fibrillation under good control

## 2016-08-23 LAB — T4, FREE: FREE T4: 0.92 ng/dL (ref 0.82–1.77)

## 2016-08-23 LAB — TSH: TSH: 1.43 u[IU]/mL (ref 0.450–4.500)

## 2016-08-26 ENCOUNTER — Encounter: Payer: Self-pay | Admitting: Family Medicine

## 2016-08-28 ENCOUNTER — Ambulatory Visit: Payer: Medicare Other | Admitting: Cardiology

## 2016-09-03 ENCOUNTER — Ambulatory Visit (INDEPENDENT_AMBULATORY_CARE_PROVIDER_SITE_OTHER): Payer: PPO | Admitting: Cardiology

## 2016-09-03 ENCOUNTER — Encounter: Payer: Self-pay | Admitting: Cardiology

## 2016-09-03 VITALS — BP 110/66 | HR 71 | Ht 73.5 in | Wt 237.6 lb

## 2016-09-03 DIAGNOSIS — I10 Essential (primary) hypertension: Secondary | ICD-10-CM

## 2016-09-03 DIAGNOSIS — I480 Paroxysmal atrial fibrillation: Secondary | ICD-10-CM | POA: Diagnosis not present

## 2016-09-03 NOTE — Progress Notes (Signed)
Electrophysiology Office Note   Date:  09/03/2016   ID:  Curtis [name], DOB 07-28-1945, MRN [id number]  PCP:  Kathyrn Drown, MD  Cardiologist:  Johnsie Cancel Primary Electrophysiologist:  Lark Langenfeld Meredith Leeds, MD    Chief Complaint  Patient presents with  . Follow-up    Persistent Afib     History of Present Illness: Curtis [name] is a 71 y.o. male who is being seen today for the evaluation of atrial fibillation at the request of Luking, Elayne Snare, MD. Presenting today for electrophysiology evaluation. He has a history of persistent atrial fibrillation status post Tikosyn loading, hypertension, hyperlipidemia, COPD and interstitial lung disease. His Tikosyn load was on 02/22/16.  He has done well. He was previously havin issues with fatigue, weakness, anorexia which he thought was due to his potassium, magnesium, and Tikosyn. Stopped those prior to his last visit.  Today, denies symptoms of palpitations, chest pain, shortness of breath, orthopnea, PND, lower extremity edema, claudication, dizziness, presyncope, syncope, bleeding, or neurologic sequela. The patient is tolerating medications without difficulties and is otherwise without complaint today.  He is continuing to lose weight, but his appetite has come back quite a bit. He has had no episodes that he feels are due to atrial fibrillation since stopping his Tikosyn   Past Medical History:  Diagnosis Date  . Arthritis   . Asthma   . Atrial fibrillation ([place])   . Cervical spondylosis   . Chest pain    Emergency room October 24, 2011, no MI  //   Nuclear, October, 2013, adenosine, EF 61%, no scar or ischemia  . Dyslipidemia    Statin intolerant  . GERD (gastroesophageal reflux disease)   . Glaucoma   . Hearing loss   . Hypertension   . Idiopathic pulmonary fibrosis ([place])   . Interstitial pulmonary fibrosis ([place]) 2012  . Left shoulder pain 2011  . Psoriasis   . Statin intolerance    Past Surgical History:  Procedure  Laterality Date  . BRAIN SURGERY    . CARDIOVERSION N/A 12/13/2015   Procedure: CARDIOVERSION;  Surgeon: Josue Hector, MD;  Location: [facility];  Service: Cardiovascular;  Laterality: N/A;  . CATARACT EXTRACTION W/PHACO Right 06/11/2012   Procedure: CATARACT EXTRACTION PHACO AND INTRAOCULAR LENS PLACEMENT ([place]);  Surgeon: Tonny Branch, MD;  Location: [facility];  Service: Ophthalmology;  Laterality: Right;  CDE:  14.43  . CATARACT EXTRACTION W/PHACO Left 06/29/2012   Procedure: CATARACT EXTRACTION PHACO AND INTRAOCULAR LENS PLACEMENT ([facility]);  Surgeon: Tonny Branch, MD;  Location: [facility];  Service: Ophthalmology;  Laterality: Left;  CDE:14.11  . CHOLECYSTECTOMY  05/08/2012  . CHOLECYSTECTOMY N/A 05/08/2012   Procedure: LAPAROSCOPIC CHOLECYSTECTOMY;  Surgeon: Harl Bowie, MD;  Location: [facility];  Service: General;  Laterality: N/A;  . COLONOSCOPY    . LIVER BIOPSY N/A 05/08/2012   Procedure: LIVER BIOPSY;  Surgeon: Harl Bowie, MD;  Location: [facility];  Service: General;  Laterality: N/A;  . NECK SURGERY     cervical disc with cliip     Current Outpatient Prescriptions  Medication Sig Dispense Refill  . albuterol (PROVENTIL HFA;VENTOLIN HFA) 108 (90 Base) MCG/ACT inhaler Inhale 2 puffs into the lungs every 6 (six) hours as needed for wheezing. 1 Inhaler 2  . apixaban (ELIQUIS) 5 MG TABS tablet Take 1 tablet (5 mg total) by mouth 2 (two) times daily. 60 tablet 6  . diltiazem (CARDIZEM CD) 120 MG 24 hr capsule Take 1 capsule (120 mg total) by  mouth daily. 30 capsule 3  . fluticasone (FLONASE) 50 MCG/ACT nasal spray Place 2 sprays into both nostrils daily. 16 g 5  . furosemide (LASIX) 40 MG tablet Take 1 tablet (40 mg total) by mouth daily. 30 tablet 3  . HYDROcodone-acetaminophen (NORCO) 10-325 MG tablet Take 1 tablet by mouth 3 (three) times daily as needed. 90 tablet 0  . omeprazole (PRILOSEC) 20 MG capsule Take 20 mg by mouth daily as needed (heartburn/indigestion). Reported on 07/21/2015      No current facility-administered medications for this visit.     Allergies:   Pirfenidone; Acyclovir and related; Oxycodone; Pravastatin; Simvastatin; Prednisone; and Tegretol [carbamazepine]   Social History:  The patient  reports that he quit smoking about 12 years ago. His smoking use included Cigarettes. He has a 26.00 pack-year smoking history. He has never used smokeless tobacco. He reports that he does not drink alcohol or use drugs.   Family History:  The patient's family history includes COPD in his mother; Cancer in his sister; Congestive Heart Failure in his mother; Hypertension in his mother; Stroke in his father.    ROS:  Please see the history of present illness.   Otherwise, review of systems is positive for appetite change, chills, weight loss, leg swelling, shortness of breath, leg pain, chest pain, hearing loss, visual change, cough, dyspnea on exertion, snoring, wheezing, nausea, back pain, muscle pain.   All other systems are reviewed and negative.   PHYSICAL EXAM: VS:  BP 110/66   Pulse 71   Ht 6' 1.5" (1.867 m)   Wt 237 lb 9.6 oz (107.8 kg)   BMI 30.92 kg/m  , BMI Body mass index is 30.92 kg/m. GEN: Well nourished, well developed, in no acute distress  HEENT: normal  Neck: no JVD, carotid bruits, or masses Cardiac: RRR; no murmurs, rubs, or gallops,no edema  Respiratory:  clear to auscultation bilaterally, normal work of breathing GI: soft, nontender, nondistended, + BS MS: no deformity or atrophy  Skin: warm and dry Neuro:  Strength and sensation are intact Psych: euthymic mood, full affect  EKG:  EKG is ordered today. Personal review of the ekg ordered shows sinus rhythm, PVCs, PACs, rate 71    Recent Labs: 05/26/2016: B Natriuretic Peptide 28.4 06/04/2016: Magnesium 2.1 06/18/2016: ALT 13; BUN 7; Creatinine, Ser 0.93; Hemoglobin 14.0; Platelets 190; Potassium 3.8; Sodium 141 08/22/2016: TSH 1.430    Lipid Panel     Component Value Date/Time   CHOL  145 06/13/2015 0728   TRIG 101 06/13/2015 0728   HDL 33 (L) 06/13/2015 0728   CHOLHDL 4.4 06/13/2015 0728   VLDL 20 06/13/2015 0728   LDLCALC 92 06/13/2015 0728     Wt Readings from Last 3 Encounters:  09/03/16 237 lb 9.6 oz (107.8 kg)  08/22/16 240 lb (108.9 kg)  06/17/16 248 lb 6 oz (112.7 kg)      Other studies Reviewed: Additional studies/ records that were reviewed today include: TTE 11/23/15  Review of the above records today demonstrates:  -Left ventricle: The cavity size was normal. Wall thickness was   normal. The estimated ejection fraction was 55%. Although no   diagnostic regional wall motion abnormality was identified, this   possibility cannot be completely excluded on the basis of this   study. - Aortic valve: Moderately calcified annulus. Trileaflet; mildly   calcified leaflets. Cusp separation was reduced. Aortic valve was   not completely interrogated. There looks to be mild aortic   stenosis. Valve area  by planimetry 1.8 cm^2. - Mitral valve: Calcified annulus. Mildly thickened, mildly   calcified leaflets . There was mild regurgitation. - Left atrium: The atrium was moderately dilated. - Right atrium: The atrium was mildly dilated. Central venous   pressure (est): 3 mm Hg. - Atrial septum: No defect or patent foramen ovale was identified. - Tricuspid valve: There was trivial regurgitation. - Pulmonary arteries: PA peak pressure: 30 mm Hg (S). - Pericardium, extracardiac: There was no pericardial effusion.   SPECT 02/05/16  There was no ST segment deviation noted during stress.  Defect 1: There is a small defect of mild severity present in the basal inferolateral and mid inferolateral location.  This is a low risk study.   Low risk stress nuclear study with a small fixed inferolateral defect. Ischemia is not seen.  ASSESSMENT AND PLAN:  1.  Persistent atrial fibrillation:  Currently on Eliquis. Stop Tikosyn in the past due to anorexia and weight  loss. Has maintained sinus rhythm since stopping Tikosyn. No changes at this time.  This patients CHA2DS2-VASc Score and unadjusted Ischemic Stroke Rate (% per year) is equal to 2.2 % stroke rate/year from a score of 2  Above score calculated as 1 point each if present [CHF, HTN, DM, Vascular=MI/PAD/Aortic Plaque, Age if 65-74, or Male] Above score calculated as 2 points each if present [Age > 75, or Stroke/TIA/TE]   2. Hypertension: Well-controlled today. No changes.   3. Lower extremity edemno further episodes of lower extremity edema.  Current medicines are reviewed at length with the patient today.   The patient does not have concerns regarding his medicines.  The following changes were made today:  none  Labs/ tests ordered today include:  Orders Placed This Encounter  Procedures  . EKG 12-Lead     Disposition:   FU with Declin Rajan 6 months  Signed, Cigi Bega Meredith Leeds, MD  09/03/2016 9:53 AM     [facility] [address] [place] [address] [phone] (office) [phone] (fax)

## 2016-09-03 NOTE — Patient Instructions (Signed)
Medication Instructions:  Your physician recommends that you continue on your current medications as directed. Please refer to the Current Medication list given to you today.  If you need a refill on your cardiac medications before your next appointment, please call your pharmacy.   Labwork: None ordered  Testing/Procedures: None ordered  Follow-Up: Your physician wants you to follow-up in: 6 months  with Dr. Camnitz.  You will receive a reminder letter in the mail two months in advance. If you don't receive a letter, please call our office to schedule the follow-up appointment.  Thank you for choosing [facility]!!   Lancer Thurner, RN [phone]  Any Other Special Instructions Will Be Listed Below (If Applicable).        

## 2016-09-04 DIAGNOSIS — J8410 Pulmonary fibrosis, unspecified: Secondary | ICD-10-CM | POA: Diagnosis not present

## 2016-09-05 ENCOUNTER — Telehealth: Payer: Self-pay | Admitting: *Deleted

## 2016-09-05 ENCOUNTER — Ambulatory Visit (HOSPITAL_COMMUNITY): Admission: RE | Admit: 2016-09-05 | Payer: PPO | Source: Ambulatory Visit

## 2016-09-05 ENCOUNTER — Encounter (HOSPITAL_COMMUNITY): Payer: Self-pay

## 2016-09-05 ENCOUNTER — Telehealth: Payer: Self-pay | Admitting: Family Medicine

## 2016-09-05 NOTE — Telephone Encounter (Signed)
-----   Message -----  From: Sammuel Bailiff  Sent: 09/05/2016 10:31 AM  To: Mikey Kirschner, MD, Kathyrn Drown, MD  Subject: Patient who refused exam, just an FYI       Patient arrive in Radiology at [phone] for 0800 CT Chest without appointment. Reqistration came off computer, I looked at reason for exam and noticed that the reason did not match what was ordered. I read office notes and look around [facility] to see if I could see any notes that explained reason for exam and what was ordered. I called Dr Lance Sell office at [phone] and was informed that Dr. Sallee Lange was on vacation and the Mickie Hillier would be in at 0800 that I would need to call back then. I walked out to waiting room at 0750 to speak with patient. I explained to patient that I needed to clarify order to make sure I was doing the correct exam based on reason for exam. Patient was very annoyed and said that somebody better do something. He proceeded to tell me that no one would answer phone in office until 0830, I told him that I had already talked with someone and that I had to wait until doctor got to office at 8. He said we were wasting his time. I apologized and explained that I wanted to do the correct exam on him so that he would not have to come back for additional imaging. I called Dr. Mickie Hillier at 0800 and explained situation, he agreed order needed to be changed. He sent me to the RN Deven Audi so that she could help me with pre auth. Allysson Rinehimer said she would need to call insurance to get approved. I called Dee to ask her to let patient know that RN was on the phone with insurance company right now. Patient stated he was not staying and left. Trinetta Alemu called me at [phone] and told me that insurance was approved. I called Mr. Wingrove at home and told him that insurance was approved, before I could tell him to come at his earliest convenience, He stated "hell I've gotten all the way back home and I am not coming back after I have wasted all  this damn time and hung up on me. I will send Thank card explaining to patient that he can come back at his earliest convenience and apologize for any inconvenience this may have caused him. I also sent patient a gas card.

## 2016-09-05 NOTE — Telephone Encounter (Signed)
CT approved [id number] with [employer] on 08/3016. Approval number is [id number]

## 2016-09-05 NOTE — Telephone Encounter (Signed)
Left message to return call to see when patent is willing to have the test rescheduled.

## 2016-09-05 NOTE — Telephone Encounter (Addendum)
Patient rescheduled his CT scan thru radiology for Tuesday Sept 4, 2018

## 2016-09-10 ENCOUNTER — Telehealth: Payer: Self-pay | Admitting: Internal Medicine

## 2016-09-10 NOTE — Telephone Encounter (Signed)
Spoke with pt, requesting appt with MR to discuss CT chest.  Pt scheduled on 9/11 to see MR.  Nothing further needed.

## 2016-09-11 ENCOUNTER — Ambulatory Visit (HOSPITAL_COMMUNITY)
Admission: RE | Admit: 2016-09-11 | Discharge: 2016-09-11 | Disposition: A | Discharge status: Home / Self Care | Payer: PPO | Source: Ambulatory Visit | Attending: Family Medicine | Admitting: Family Medicine

## 2016-09-11 DIAGNOSIS — I700 Atherosclerosis of aorta: Secondary | ICD-10-CM | POA: Diagnosis not present

## 2016-09-11 DIAGNOSIS — J8410 Pulmonary fibrosis, unspecified: Secondary | ICD-10-CM | POA: Diagnosis not present

## 2016-09-11 DIAGNOSIS — J849 Interstitial pulmonary disease, unspecified: Secondary | ICD-10-CM | POA: Diagnosis not present

## 2016-09-11 DIAGNOSIS — K7460 Unspecified cirrhosis of liver: Secondary | ICD-10-CM | POA: Insufficient documentation

## 2016-09-16 ENCOUNTER — Ambulatory Visit (INDEPENDENT_AMBULATORY_CARE_PROVIDER_SITE_OTHER): Payer: PPO | Admitting: Family Medicine

## 2016-09-16 ENCOUNTER — Encounter: Payer: Self-pay | Admitting: Family Medicine

## 2016-09-16 VITALS — BP 118/70 | Temp 98.4°F | Ht 73.5 in | Wt 225.0 lb

## 2016-09-16 DIAGNOSIS — R3589 Other polyuria: Secondary | ICD-10-CM

## 2016-09-16 DIAGNOSIS — R358 Other polyuria: Secondary | ICD-10-CM | POA: Diagnosis not present

## 2016-09-16 DIAGNOSIS — S76212A Strain of adductor muscle, fascia and tendon of left thigh, initial encounter: Secondary | ICD-10-CM | POA: Diagnosis not present

## 2016-09-16 DIAGNOSIS — M7541 Impingement syndrome of right shoulder: Secondary | ICD-10-CM | POA: Diagnosis not present

## 2016-09-16 LAB — POCT URINALYSIS DIPSTICK
Spec Grav, UA: >=1.03 — AB (ref 1.010–1.025)
pH, UA: 5 (ref 5.0–8.0)

## 2016-09-16 MED ORDER — PREDNISONE 20 MG PO TABS: ORAL_TABLET | ORAL | 0 refills | Status: DC

## 2016-09-16 NOTE — Progress Notes (Signed)
   Subjective:    Patient ID: Curtis [name], male    DOB: [birth year], 71 y.o.   MRN: [id number]  HPIpain in left groin. Started 5 days ago. Pain worse when bending.This is in the left groin region not in the testicles no dysuria no abdominal pain no sweats chills or fever. Does not know of any injury that triggered it.  Right Shoulder pain. Started about 4 weeks ago.  He relates that he has times where when he raises his arm to the right side he gets a sharp pain he denies any radiation down the arm denies any numbness denies weakness   Review of Systems  Constitutional: Negative for activity change, fatigue and fever.  Respiratory: Negative for cough and shortness of breath.   Cardiovascular: Negative for chest pain and leg swelling.  Neurological: Negative for headaches.       Objective:   Physical Exam  Constitutional: He appears well-nourished. No distress.  Cardiovascular: Normal rate, regular rhythm and normal heart sounds.   No murmur heard. Pulmonary/Chest: Effort normal and breath sounds normal. No respiratory distress.  Musculoskeletal: He exhibits no edema.  Lymphadenopathy:    He has no cervical adenopathy.  Neurological: He is alert.  Psychiatric: His behavior is normal.  Vitals reviewed.  Left groin tenderness to palpation some discomfort with movement against resistance right shoulder some subjective discomfort with movement.       Assessment & Plan:  Groin discomfort on the left side. Relates inflammation soreness tenderness-cannot take anti-inflammatories because of medication, will go ahead with low-dose prednisone over the next 6 days as an anti-inflammatory if not doing better consider physical therapy  Right shoulder pain discomfort orthopedic referral for further evaluation may need injection  Urinalysis was checked but no sign of infection

## 2016-09-16 NOTE — Patient Instructions (Signed)
As part of today's visit a referral has been made. This is a process that is handled by our clinical referral specialists. This process requires that we send your medical information to the specialists for their review before they will issue you an appointment. Unfortunately this does take time and much of this process is under the responsibility of the specialists. For emergent referrals we do our best to speed up this process.Our referral specialist will make certain that your insurance company is notified as well as the physician group that we are referring you to for your problem. Emergent referrals are made as quick as possible. Most standard referrals often take 10 days before we hear from the specialists office when they can see you. If you have not heard when your appointment is from us or the referral specialists within 10 days please call us regarding this referral.  

## 2016-09-17 ENCOUNTER — Telehealth: Payer: Self-pay | Admitting: Family Medicine

## 2016-09-17 ENCOUNTER — Encounter: Payer: Self-pay | Admitting: Internal Medicine

## 2016-09-17 ENCOUNTER — Ambulatory Visit (INDEPENDENT_AMBULATORY_CARE_PROVIDER_SITE_OTHER): Payer: PPO | Admitting: Internal Medicine

## 2016-09-17 VITALS — BP 120/66 | HR 86 | Ht 73.0 in | Wt 222.0 lb

## 2016-09-17 DIAGNOSIS — Z23 Encounter for immunization: Secondary | ICD-10-CM | POA: Diagnosis not present

## 2016-09-17 DIAGNOSIS — J84112 Idiopathic pulmonary fibrosis: Secondary | ICD-10-CM

## 2016-09-17 NOTE — Progress Notes (Signed)
Subjective:     Patient ID: Curtis [name], male   DOB: 1945/08/17, 71 y.o.   MRN: [id number]  HPI   @Patient  ID: Curtis [name], male    DOB: March 31, 1945, 71 y.o.   MRN: [id number]  Chief Complaint  Patient presents with  . Follow-up    IPF     IOV 02/09/2016  Chief Complaint  Patient presents with  . Pulmonary Consult    Referred by Dr. Wolfgang Phoenix for pulomnary fibrosis, Pt. states his breathing is ok, Pt. c/o of sob with exertion, slight cough, Denies chest tightness    71 year old male accompanied by his wife Lela Gell. He has a diagnosis of only fibrosis with high pretest probability of idiopathic Pulmonray fibrosis being managed at [facility] by Dr. 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 History is gained from him, talking to his wife and review and summarization of the [employer]. He has had remote asbestos exposure while working in [employer]. 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 He also has a 26 pack smoking history which he quit in 2004. His pulmonary imaging has never showed pleural plaques. He's had insidious worsening of dyspnea for many years. His CT scans at [facility] are reported as possible UIP. He's also been maintained on low-dose prednisone which according to the review of [employer] notes in this has helped him. He is low antibody titer positive for ANA and MI 2 in 2014 . Diagnosis given was of IPF. In October 2017 follow-up visit to [facility] according to the patient and review of the charts show 11/01/2015 high-resolution CT chest showed progression of disease or lung disease compared to 2014 and change in the pattern from possible UIP per definitive UIP. In addition there is also lingula nodule that measured 8 mm up from 6 mm in 2014. Given the progression and the definite change in CT chest the diagnosis of definite of UIP/IPF was made and the patient was counseled about starting specific  anti-fibrotic therapy. He and his wife are fully aware of the 2 anti-fibrotic regimens of Pirfenidone (Esbriet) and Ofev. He says his Coumadin to starting one of these at our consult.   He also tells me the year 2017 he's had several setbacks with this helped including atrial fibrillation and he is now on anticoagulations wary of taking Ofev. He is also had Bell's palsy and an neurologic issue. Taking into account of all this and is visible obesity feels dyspnea is worsened because of lung disease.  He is also wondering how much of his pulmonary fibrosis is related to previous asbestos exposure.  In terms of disease severity: He uses oxygen with exertion. In 2013 FVC and DLCO 60% each. In January 2017 his FVC is 2.2 L/42% predicted with FEV1 of 1.6 L/42% predicted, TLC of 47% predicted and DLCO 14.2/55% predicted suggesting overall slow steady progression.  In terms of comorbidities nuclear medicine cardiac stress of 02/02/2016 was low risk  Diffuse parenchymal lung disease prob list in [facility] jan 2017 [facility] notes copied and pasted and summarized A. Possible UIP pattern by current ATS criteria -> changed to progression and definite UIP Oct 2017 B. Significant asbestos exposure in [employer]  C. 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 Subjective improvement with Advair; no air trapping seen on CT  E. No symptoms suggestive of CTD; labs show aldolase of 10.2 and weak positive MI-2.  F. TPMT level is 8.7 (low), indicating  he may not metabolize azathioprine effectively.  2. Exertional hypoxia, improved.  OV 03/15/2016  Chief Complaint  Patient presents with  . Follow-up    Pt states he is waiting on a form from [place] to start his Esbriet. Pt states his breathing is unchanged since last OV. Pt here after PFT.      Follow-up idiopathic pulmonary fibrosis also asbestos exposure but without pleural plaques  He presents with his son and? Daughter. He is here to discuss esbriet Start is  going. However he has run into problems with charity and paperwork. In addition apparently [place] did not disclose my last name insert resulted in confusion. Apparently they disclose only the first name. Overall he is stable.. Pulmonary function testing done today at [facility] but the result is not yet available. We discussed some of the remaining items such as a letter about asbestos exposure causing ILD, pulmonary fibrosis foundation support group and referred to pulmonary rehabilitation.     04/26/2016 Follow up: IPF /ER follow up  Pt returns for follow up from recent ER visit . Pt says he recently has been seen for acute abdominal pain and chest pain and nausea. ER workup was unrevealing for cardiac source . CXR showed chronic IPF . No acute process. Troponin /BNP nml . AST /ALT were nnl . T. Bili was elevated at 3.4.  Patient says about a week ago that he developed some significant nausea, lower abdominal pain and chest tightness. He had no vomiting or diarrhea. Patient recently started on Esbriet . He initially started on 1 capsule 3 times a day. On the second week he began 2 capsules 3 times a day. On the day of the increased dose titration patient abdominal symptoms began. Patient has held aspirin. Patient was seen by his primary care physician. He was felt to have possible diverticulitis. And started on Cipro and Flagyl.. Patient is on chronic narcotics and has constipation. CT abdomen and pelvis was done on 04/23/2015 that showed sigmoid diverticulosis without diverticulitis. Cirrhotic appearing liver . No mass noted.  It appears a ABD [place] is pending from PCP.   Patient presents today and says that he is feeling much better. Nausea has resolved. Stomach is feeling better and patient's appetite has returned.  Patient is very concerned that he has held his Esbriet. He felt that the short week that he was on this. His breathing improved substantially. He had less shortness of breath and  improved exercise tolerance.    OV 09/17/2016  Chief Complaint  Patient presents with  . Follow-up    Pt states his SOB is unchanged since last OV. Pt states he received an abx from PCP for a prod cough - pt states his cough has resovled. Pt denies CP/tightness and f/c/s.    Follow-up idiopathic pulmonary fibrosis  Around late March or early April 2018 he started Pirfenidone (Esbriet). He says that shortly after starting it he started feeling good but then he escalated the dose he started having GI side effects. In April 2018 he did have CT scan of the chest that did not show any diverticulitis. Nevertheless review of the chart shows he was treated as diverticulitis. He did have hyperbilirubinemia. With this his Pirfenidone (Esbriet) was stopped and his GI symptoms resolved. He is frustrated that Pirfenidone (Esbriet) caused the symptoms. At this point in time for his IPF given the significant GI side effects is not interested in [place]. Overall since then he's intentionally lost weight. His goal is  to get below 200 pounds. With the loss of weight he's feeling better. Shortness of breath is improved. He is not yet attending pulmonary rehabilitation. He is asking about innogen oxygen system for help with exertional hypoxemia although he recognizes that with weight loss this might not be an issue anymore. He is willing to have high dose flu shot is on Indocin basic supportive care for his IPF and this point   K-BILD ILD QUESTIONNAIRE, Symptom score over prior 2 weeks  7-none, 6-rarely, 5-occ, 5-some times, 3-sev times, 2-most times, 1-every time 09/17/2016   Dyspnea for stairs, incline or hill   Chest Tightness   Worry about seriousness of lung complaint   Avoided doing things that make you dyspneic   Have you felt loss of control of lung condition (reversed from original)   Felt fed up due to lung condition   Felt urge to breathe aka air hunger   Has lung condition made you feel anxious   How  often have you experienced wheezing or whistling sound   How much of the time have you felt your lung dz is getting worse   How much has your lung condition interfered with job or daily task   Were you expecting your lung condition to get worse   How much has your lung function limited you carrying things like groceris   How much has your lung function made you think of EOL?   Total   Are you financially worse off   Grand Total        IMPRESSION: 1. Pattern progressive pulmonary fibrosis is suspicious for usual interstitial pneumonitis, despite lack of a Strong craniocaudal gradient. Fibrotic nonspecific interstitial pneumonitis is not excluded.There is a peripheral pattern of subpleural reticulation, traction bronchiectasis/bronchiolectasis, ground-glass and honeycombing, progressive from 08/15/2009 2. Aortic atherosclerosis (ICD10-170.0). Three-vessel involvement of the coronary arteries. 3. Cirrhosis.   Electronically Signed   By: Lorin Picket M.D.   On: 09/11/2016 10:37    Results for MAYNOR, MWANGI (MRN [id number]) as of 09/17/2016 14:37  Ref. Range 03/01/2014 09:40 10/17/2014 09:01 06/13/2015 07:28 07/21/2015 15:20 11/14/2015 09:30 12/12/2015 08:21 02/19/2016 11:03 02/19/2016 20:25 02/20/2016 03:30 02/21/2016 04:25 02/29/2016 10:00 03/07/2016 08:52 03/08/2016 09:00 03/15/2016 10:15 03/19/2016 11:31 04/16/2016 08:07 04/21/2016 02:09 04/22/2016 09:32 05/26/2016 00:31 05/26/2016 20:50 05/26/2016 21:00 05/26/2016 22:52 05/27/2016 01:33 06/04/2016 09:15 06/18/2016 08:43 06/18/2016 08:44  Total Bilirubin Latest Ref Range: 0.0 - 1.2 mg/dL 1.7 (H)  1.3 (H) 2.5 (H)              3.4 (H)    2.5 (H)    1.4 (H)     has a past medical history of Arthritis; Asthma; Atrial fibrillation ([place]); Cervical spondylosis; Chest pain; Dyslipidemia; GERD (gastroesophageal reflux disease); Glaucoma; Hearing loss; Hypertension; Idiopathic pulmonary fibrosis ([place]); Interstitial pulmonary fibrosis ([place]) (2012); Left  shoulder pain (2011); Psoriasis; and Statin intolerance.   reports that he quit smoking about 13 years ago. His smoking use included Cigarettes. He has a 26.00 pack-year smoking history. He has never used smokeless tobacco.  Past Surgical History:  Procedure Laterality Date  . BRAIN SURGERY    . CARDIOVERSION N/A 12/13/2015   Procedure: CARDIOVERSION;  Surgeon: Josue Hector, MD;  Location: [place];  Service: Cardiovascular;  Laterality: N/A;  . CATARACT EXTRACTION W/PHACO Right 06/11/2012   Procedure: CATARACT EXTRACTION PHACO AND INTRAOCULAR LENS PLACEMENT ([place]);  Surgeon: Tonny Branch, MD;  Location: [facility];  Service: Ophthalmology;  Laterality: Right;  CDE:  14.43  . CATARACT  EXTRACTION W/PHACO Left 06/29/2012   Procedure: CATARACT EXTRACTION PHACO AND INTRAOCULAR LENS PLACEMENT ([facility]);  Surgeon: Tonny Branch, MD;  Location: [facility];  Service: Ophthalmology;  Laterality: Left;  CDE:14.11  . CHOLECYSTECTOMY  05/08/2012  . CHOLECYSTECTOMY N/A 05/08/2012   Procedure: LAPAROSCOPIC CHOLECYSTECTOMY;  Surgeon: Harl Bowie, MD;  Location: [facility];  Service: General;  Laterality: N/A;  . COLONOSCOPY    . LIVER BIOPSY N/A 05/08/2012   Procedure: LIVER BIOPSY;  Surgeon: Harl Bowie, MD;  Location: [place];  Service: General;  Laterality: N/A;  . NECK SURGERY     cervical disc with cliip    Allergies  Allergen Reactions  . Pirfenidone     Elevated bilirubin  . Acyclovir And Related   . Oxycodone Itching  . Pravastatin Other (See Comments)    Chills    . Simvastatin Other (See Comments)    Chills    . Prednisone Anxiety    Irritation  . Tegretol [Carbamazepine] Other (See Comments)    dizziness    Immunization History  Administered Date(s) Administered  . Influenza Split 10/04/2010, 09/28/2011  . Influenza,inj,Quad PF,6+ Mos 09/28/2013, 10/17/2014, 10/23/2015  . Influenza-Unspecified 09/08/2011  . Pneumococcal Conjugate-13 10/03/2009, 09/28/2013  . Pneumococcal  Polysaccharide-23 01/08/2008, 10/03/2009  . Td 10/07/2009    Family History  Problem Relation Age of Onset  . Hypertension Mother   . Congestive Heart Failure Mother   . COPD Mother   . Stroke Father   . Cancer Sister        breast     Current Outpatient Prescriptions:  .  albuterol (PROVENTIL HFA;VENTOLIN HFA) 108 (90 Base) MCG/ACT inhaler, Inhale 2 puffs into the lungs every 6 (six) hours as needed for wheezing., Disp: 1 Inhaler, Rfl: 2 .  apixaban (ELIQUIS) 5 MG TABS tablet, Take 1 tablet (5 mg total) by mouth 2 (two) times daily., Disp: 60 tablet, Rfl: 6 .  fluticasone (FLONASE) 50 MCG/ACT nasal spray, Place 2 sprays into both nostrils daily., Disp: 16 g, Rfl: 5 .  furosemide (LASIX) 40 MG tablet, Take 1 tablet (40 mg total) by mouth daily. (Patient taking differently: Take 40 mg by mouth as needed. ), Disp: 30 tablet, Rfl: 3 .  HYDROcodone-acetaminophen (NORCO) 10-325 MG tablet, Take 1 tablet by mouth 3 (three) times daily as needed., Disp: 90 tablet, Rfl: 0 .  omeprazole (PRILOSEC) 20 MG capsule, Take 20 mg by mouth daily as needed (heartburn/indigestion). Reported on 07/21/2015, Disp: , Rfl:  .  predniSONE (DELTASONE) 20 MG tablet, 3qd for 2d then 2qd for 2d then 1qd for 2d, Disp: 12 tablet, Rfl: 0 .  diltiazem (CARDIZEM CD) 120 MG 24 hr capsule, Take 1 capsule (120 mg total) by mouth daily., Disp: 30 capsule, Rfl: 3   Review of Systems     Objective:   Physical Exam  Constitutional: He is oriented to person, place, and time. He appears well-developed and well-nourished. No distress.  HENT:  Head: Normocephalic and atraumatic.  Right Ear: External ear normal.  Left Ear: External ear normal.  Mouth/Throat: Oropharynx is clear and moist. No oropharyngeal exudate.  Eyes: Pupils are equal, round, and reactive to light. Conjunctivae and EOM are normal. Right eye exhibits no discharge. Left eye exhibits no discharge. No scleral icterus.  Neck: Normal range of motion. Neck  supple. No JVD present. No tracheal deviation present. No thyromegaly present.  Cardiovascular: Normal rate, regular rhythm and intact distal pulses.  Exam reveals no gallop and no friction rub.  No murmur heard. Pulmonary/Chest: Effort normal. No respiratory distress. He has no wheezes. He has rales. He exhibits no tenderness.  Abdominal: Soft. Bowel sounds are normal. He exhibits no distension and no mass. There is no tenderness. There is no rebound and no guarding.  Musculoskeletal: Normal range of motion. He exhibits no edema or tenderness.  Lymphadenopathy:    He has no cervical adenopathy.  Neurological: He is alert and oriented to person, place, and time. He has normal reflexes. No cranial nerve deficit. Coordination normal.  Skin: Skin is warm and dry. No rash noted. He is not diaphoretic. No erythema. No pallor.  Psychiatric: He has a normal mood and affect. His behavior is normal. Judgment and thought content normal.  Nursing note and vitals reviewed.  Vitals:   09/17/16 1432  BP: 120/66  Pulse: 86  SpO2: 97%  Weight: 222 lb (100.7 kg)  Height: 6\' 1"  (1.854 m)    Estimated body mass index is 29.29 kg/m as calculated from the following:   Height as of this encounter: 6\' 1"  (1.854 m).   Weight as of this encounter: 222 lb (100.7 kg).     Assessment:       ICD-10-CM   1. IPF (idiopathic pulmonary fibrosis) (HCC) P71.062 Pulmonary Function Test    6 minute walk       Plan:       You are stable Glad you intentionally lost weight   plan - esbriet -never again  - we discussed ofev for IPF but also has gi side effect; so will hold off - continue weight loss -Reassess in the future for any pulmonary fibrosis trials that do not have GI side effects - Recommend pulmonary rehabilitation at [place] 6 minute walk test Restasis for desaturation and qualification for Innogen o2 system  - do asap at your convenience; you can even do  this at follow-up -  High dose flu shot today 09/17/2016  Follow-up - in 3 months do Pre-bd spiro and dlco only. No lung volume or bd response. No post-bd spiro -Return to see Dr. Chase Caller in 3 months or sooner if needed    > 50% of this > 25 min visit spent in face to face counseling or coordination of care    Dr. Brand Males, M.D., [facility].C.P Pulmonary and Critical Care Medicine Staff Physician [facility] Pager: [phone], If no answer or between  15:00h - 7:00h: call [phone]  09/17/2016 3:07 PM

## 2016-09-17 NOTE — Patient Instructions (Addendum)
ICD-10-CM   1. IPF (idiopathic pulmonary fibrosis) ([place]) J84.112     You are stable Glad you intentionally lost weight   plan - esbriet -never again  - we discussed ofev for IPF but also has gi side effect; so will hold off - continue weight loss -Reassess in the future for any pulmonary fibrosis trials that do not have GI side effects - Recommend pulmonary rehabilitation at [place] 6 minute walk test Restasis for desaturation and qualification for Innogen o2 system  - do asap at your convenience; you can even do  this at follow-up - High dose flu shot today 09/17/2016  Follow-up - in 3 months do Pre-bd spiro and dlco only. No lung volume or bd response. No post-bd spiro -Return to see Dr. Chase Caller in 3 months or sooner if needed

## 2016-09-17 NOTE — Telephone Encounter (Signed)
Note-according to cardiologist further testing not indicated unless he has symptoms -this is in regards to recent coronary artery disease seen on CAT scan

## 2016-09-18 ENCOUNTER — Telehealth: Payer: Self-pay | Admitting: Family Medicine

## 2016-09-18 ENCOUNTER — Ambulatory Visit (HOSPITAL_COMMUNITY): Payer: PPO

## 2016-09-18 ENCOUNTER — Encounter: Payer: Self-pay | Admitting: Family Medicine

## 2016-09-18 NOTE — Telephone Encounter (Signed)
Nurses please clarify. Is he requesting Xanax?

## 2016-09-18 NOTE — Telephone Encounter (Signed)
Patient was prescribed prednisone on 09/16/16 by Dr. Nicki Reaper.  He said it has been making him a little irritable and he wants to know if Dr. Nicki Reaper will prescribe him Zantarac like they discussed before?  [facility] [employer]

## 2016-09-19 ENCOUNTER — Other Ambulatory Visit: Payer: Self-pay | Admitting: *Deleted

## 2016-09-19 MED ORDER — ALPRAZOLAM 0.5 MG PO TABS: ORAL_TABLET | ORAL | 0 refills | Status: DC

## 2016-09-19 NOTE — Telephone Encounter (Signed)
Patient stated that he would like a prescription for Xanax. Please advise?

## 2016-09-19 NOTE — Telephone Encounter (Signed)
Xanax 0.5,#20- 1/2 to 1 bid prn caution drowsiness

## 2016-09-20 NOTE — Telephone Encounter (Signed)
Pt notified on voicemail that rx was sent to pharm.

## 2016-09-25 ENCOUNTER — Ambulatory Visit (HOSPITAL_COMMUNITY)
Admission: RE | Admit: 2016-09-25 | Discharge: 2016-09-25 | Disposition: A | Discharge status: Home / Self Care | Payer: PPO | Source: Ambulatory Visit | Attending: Internal Medicine | Admitting: Internal Medicine

## 2016-09-25 DIAGNOSIS — J84112 Idiopathic pulmonary fibrosis: Secondary | ICD-10-CM | POA: Diagnosis not present

## 2016-09-29 ENCOUNTER — Other Ambulatory Visit (HOSPITAL_COMMUNITY): Payer: Self-pay | Admitting: Nurse Practitioner

## 2016-09-29 DIAGNOSIS — I4819 Other persistent atrial fibrillation: Secondary | ICD-10-CM

## 2016-10-01 ENCOUNTER — Other Ambulatory Visit (HOSPITAL_COMMUNITY): Payer: Self-pay | Admitting: Nurse Practitioner

## 2016-10-01 DIAGNOSIS — I4819 Other persistent atrial fibrillation: Secondary | ICD-10-CM

## 2016-10-02 ENCOUNTER — Encounter: Payer: Self-pay | Admitting: *Deleted

## 2016-10-02 ENCOUNTER — Ambulatory Visit (INDEPENDENT_AMBULATORY_CARE_PROVIDER_SITE_OTHER): Payer: PPO | Admitting: Orthopaedic Surgery

## 2016-10-02 ENCOUNTER — Encounter (INDEPENDENT_AMBULATORY_CARE_PROVIDER_SITE_OTHER): Payer: Self-pay | Admitting: Orthopaedic Surgery

## 2016-10-02 ENCOUNTER — Ambulatory Visit (INDEPENDENT_AMBULATORY_CARE_PROVIDER_SITE_OTHER): Payer: PPO

## 2016-10-02 VITALS — Ht 72.0 in | Wt 225.0 lb

## 2016-10-02 DIAGNOSIS — M25511 Pain in right shoulder: Secondary | ICD-10-CM

## 2016-10-02 MED ORDER — LIDOCAINE HCL 1 % IJ SOLN
2.0000 mL | INTRAMUSCULAR | Status: AC | PRN
  Administered 2016-10-02: 2 mL

## 2016-10-02 MED ORDER — METHYLPREDNISOLONE ACETATE 40 MG/ML IJ SUSP
80.0000 mg | INTRAMUSCULAR | Status: AC | PRN
  Administered 2016-10-02: 80 mg

## 2016-10-02 MED ORDER — BUPIVACAINE HCL 0.5 % IJ SOLN
2.0000 mL | INTRAMUSCULAR | Status: AC | PRN
  Administered 2016-10-02: 2 mL via INTRA_ARTICULAR

## 2016-10-02 NOTE — Progress Notes (Signed)
Office Visit Note   Patient: Curtis [name]           Date of Birth: 05-Jan-1946           MRN: [id number] Visit Date: 10/02/2016              Requested by: Kathyrn Drown, MD [place], [place] [address] PCP: Kathyrn Drown, MD   Assessment & Plan: Visit Diagnoses:  1. Acute pain of right shoulder    impingement syndrome with possible rotator cuff tear Plan: long discussion regarding possible diagnoses. We will try a cortisone injection subacromially and monitor the response. Consider MRI scan if no improvement  Follow-Up Instructions: No Follow-up on file.   Orders:  Orders Placed This Encounter  Procedures  . XR Shoulder Right   No orders of the defined types were placed in this encounter.     Procedures: Large Joint Inj Date/Time: 10/02/2016 11:02 AM Performed by: [name] Authorized by: [name]   Consent Given by:  Patient Timeout: prior to procedure the correct patient, procedure, and site was verified   Indications:  Pain Location:  Shoulder Site:  L subacromial bursa Prep: patient was prepped and draped in usual sterile fashion   Needle Size:  25 G Needle Length:  1.5 inches Approach:  Lateral Ultrasound Guidance: No   Fluoroscopic Guidance: No   Arthrogram: No   Medications:  80 mg methylPREDNISolone acetate 40 MG/ML; 2 mL lidocaine 1 %; 2 mL bupivacaine 0.5 % Aspiration Attempted: No   Patient tolerance:  Patient tolerated the procedure well with no immediate complications     Clinical Data: No additional findings.   Subjective: Chief Complaint  Patient presents with  . Right Shoulder - Pain    Curtis [name] is a 71 y o that presents with acute shoulder pain x 6 weeks. Denies injury, not diabetic,  Curtis Hollifieldis accompanied by his daughter and here for evaluation of right shoulder pain. He notes insidious onset approximately 6 weeks ago. Presently he is having painith overhead motion ad  sleeping on her shoulder. He denies any numbness or tingling. No shortness of breath or chest pain at present. Denies any problems with his cervical spine. Many years ago he had a cortisone injection that "lasted forever".  HPI  Review of Systems  Constitutional: Negative for fatigue.  HENT: Negative for hearing loss.   Respiratory: Positive for chest tightness and shortness of breath. Negative for apnea.   Cardiovascular: Positive for chest pain, palpitations and leg swelling.  Gastrointestinal: Negative for blood in stool, constipation and diarrhea.  Genitourinary: Negative for difficulty urinating.  Musculoskeletal: Positive for back pain, joint swelling, neck pain and neck stiffness. Negative for arthralgias and myalgias.  Allergic/Immunologic: Positive for immunocompromised state.  Neurological: Positive for dizziness, light-headedness and headaches. Negative for weakness and numbness.  Hematological: Does not bruise/bleed easily.  Psychiatric/Behavioral: Negative for sleep disturbance. The patient is not nervous/anxious.      Objective: Vital Signs: Ht 6' (1.829 m)   Wt 225 lb (102.1 kg)   BMI 30.52 kg/m   Physical Exam  Ortho Ex[place]ake alert and oriented 3. Denies any pain with range of motion of cervical spine. Positive impingement and positive empty can testing right shoulder. No popping or clicking. Able to raise his arm over his head with a painful arc of motion.skin intact.good grip and release. No weakness.Mild tenderness at the acromioclavicular joint and anterior subacromial region.psoriatic skin lesions both forearms. Historyof  skin cancers  Specialty Comments:  No specialty comments available.  Imaging: No results found.   PMFS History: Patient Active Problem List   Diagnosis Date Noted  . Chronic respiratory failure ([place]) 04/26/2016  . Hyperbilirubinemia 04/26/2016  . H/O asbestos exposure 03/15/2016  . Persistent atrial fibrillation ([place]) 02/19/2016  .  Atrial fibrillation, persistent ([place]) 02/19/2016  . IPF (idiopathic pulmonary fibrosis) ([place]) 02/09/2016  . Incidental lung nodule, greater than or equal to 105mm 02/09/2016  . Bell's palsy 08/03/2015  . Insomnia 06/08/2015  . Right trigeminal neuralgia 04/01/2014  . Chronic back pain 03/01/2014  . Elevated transaminase level 06/25/2013  . Obesity (BMI 30-39.9) 09/09/2012  . Chronic neck pain 09/09/2012  . Symptomatic cholelithiasis 04/14/2012  . Chest pain   . Statin intolerance   . Dyslipidemia   . GERD (gastroesophageal reflux disease)   . Hypertension   . INTERSTITIAL LUNG DISEASE 03/16/2010  . PSORIASIS 03/16/2010   Past Medical History:  Diagnosis Date  . Arthritis   . Asthma   . Atrial fibrillation ([place])   . Cataract   . Cervical spondylosis   . Chest pain    Emergency room October 24, 2011, no MI  //   Nuclear, October, 2013, adenosine, EF 61%, no scar or ischemia  . Dyslipidemia    Statin intolerant  . GERD (gastroesophageal reflux disease)   . Glaucoma   . Hearing loss   . Hypertension   . Hypertension   . Idiopathic pulmonary fibrosis ([place])   . Interstitial pulmonary fibrosis ([place]) 2012  . Left shoulder pain 2011  . Psoriasis   . Pulmonary fibrosis ([place])   . Statin intolerance     Family History  Problem Relation Age of Onset  . Hypertension Mother   . Congestive Heart Failure Mother   . COPD Mother   . Stroke Father   . Cancer Sister        breast    Past Surgical History:  Procedure Laterality Date  . BRAIN SURGERY    . CARDIOVERSION N/A 12/13/2015   Procedure: CARDIOVERSION;  Surgeon: Josue Hector, MD;  Location: [facility];  Service: Cardiovascular;  Laterality: N/A;  . CATARACT EXTRACTION W/PHACO Right 06/11/2012   Procedure: CATARACT EXTRACTION PHACO AND INTRAOCULAR LENS PLACEMENT ([place]);  Surgeon: Tonny Branch, MD;  Location: [facility];  Service: Ophthalmology;  Laterality: Right;  CDE:  14.43  . CATARACT EXTRACTION W/PHACO Left 06/29/2012    Procedure: CATARACT EXTRACTION PHACO AND INTRAOCULAR LENS PLACEMENT ([facility]);  Surgeon: Tonny Branch, MD;  Location: [facility];  Service: Ophthalmology;  Laterality: Left;  CDE:14.11  . CHOLECYSTECTOMY  05/08/2012  . CHOLECYSTECTOMY N/A 05/08/2012   Procedure: LAPAROSCOPIC CHOLECYSTECTOMY;  Surgeon: Harl Bowie, MD;  Location: [facility];  Service: General;  Laterality: N/A;  . COLONOSCOPY    . LIVER BIOPSY N/A 05/08/2012   Procedure: LIVER BIOPSY;  Surgeon: Harl Bowie, MD;  Location: [facility];  Service: General;  Laterality: N/A;  . NECK SURGERY     cervical disc with cliip   Social History   Occupational History  . Not on file.   Social History Main Topics  . Smoking status: Former Smoker    Packs/day: 0.50    Years: 52.00    Types: Cigarettes    Quit date: 09/08/2003  . Smokeless tobacco: Never Used  . Alcohol use No     Comment: quit drinking 2004  . Drug use: No  . Sexual activity: Yes    Birth control/ protection:  None

## 2016-10-02 NOTE — Progress Notes (Unsigned)
IPF PRO Registry Purpose: To collect data and biological samples that will support future research studies.  Registry will describe current approaches to diagnosis and treatment of IPF, analyze participant characteristics to describe the natural history of the disease, assess quality of life, describe participants interactions with the health care system, describe IPF treatment practices across multiple institutions, and utilize biological samples linked to well characterized IPF participants to identify disease biomarkers.   Clinical Research Coordinator / Research RN note : The visit for Subject Curtis [name] with DOB: 16-May-1945 for visit 6 months for above stated protocol  for purpose of research was unable to be conducted. The consent and Protocol Version are currently IRB approved.  We have tried multiple attempts to contact subject with no avail. Certified letter has been mailed to patient, still with no response. Monitor and sponsor for study are aware of this matter; per monitor/sponsor's recommendations we are to abstract data we have at this time and enter into [facility].  Will try to reach out to patient for 12 month follow up.  Nothing further at this time.  This CRC has confirmed that the note will be routed to PI.  Signed by  August Luz  Clinical Research Coordinator / Nurse [employer]  [place], [place] 3:47 PM 10/02/2016

## 2016-10-03 ENCOUNTER — Other Ambulatory Visit: Payer: Self-pay | Admitting: *Deleted

## 2016-10-03 ENCOUNTER — Telehealth (INDEPENDENT_AMBULATORY_CARE_PROVIDER_SITE_OTHER): Payer: Self-pay | Admitting: Orthopaedic Surgery

## 2016-10-03 DIAGNOSIS — M25511 Pain in right shoulder: Principal | ICD-10-CM

## 2016-10-03 DIAGNOSIS — G8929 Other chronic pain: Secondary | ICD-10-CM

## 2016-10-03 NOTE — Telephone Encounter (Signed)
Left message for patient, returning his call regarding MRI.

## 2016-10-03 NOTE — Telephone Encounter (Signed)
Patient request a call back from [place] in reference to scheduling MRI Rt Shoulder.

## 2016-10-05 DIAGNOSIS — J8410 Pulmonary fibrosis, unspecified: Secondary | ICD-10-CM | POA: Diagnosis not present

## 2016-10-08 DIAGNOSIS — Z23 Encounter for immunization: Secondary | ICD-10-CM | POA: Diagnosis not present

## 2016-10-08 DIAGNOSIS — L570 Actinic keratosis: Secondary | ICD-10-CM | POA: Diagnosis not present

## 2016-10-11 ENCOUNTER — Ambulatory Visit (HOSPITAL_COMMUNITY)
Admission: RE | Admit: 2016-10-11 | Discharge: 2016-10-11 | Disposition: A | Discharge status: Home / Self Care | Payer: PPO | Source: Ambulatory Visit | Attending: Orthopaedic Surgery | Admitting: Orthopaedic Surgery

## 2016-10-11 DIAGNOSIS — G8929 Other chronic pain: Secondary | ICD-10-CM | POA: Diagnosis not present

## 2016-10-11 DIAGNOSIS — M25511 Pain in right shoulder: Secondary | ICD-10-CM | POA: Insufficient documentation

## 2016-10-23 ENCOUNTER — Telehealth: Payer: Self-pay | Admitting: Family Medicine

## 2016-10-23 ENCOUNTER — Telehealth: Payer: Self-pay

## 2016-10-23 ENCOUNTER — Ambulatory Visit (INDEPENDENT_AMBULATORY_CARE_PROVIDER_SITE_OTHER): Payer: PPO | Admitting: Orthopaedic Surgery

## 2016-10-23 VITALS — BP 122/74 | HR 71 | Resp 14 | Ht 72.0 in | Wt 225.0 lb

## 2016-10-23 DIAGNOSIS — M25511 Pain in right shoulder: Secondary | ICD-10-CM | POA: Diagnosis not present

## 2016-10-23 DIAGNOSIS — G8929 Other chronic pain: Secondary | ICD-10-CM

## 2016-10-23 NOTE — Telephone Encounter (Signed)
   [facility] Pre-operative Risk Assessment    Request for surgical clearance:  1. What type of surgery is being performed? Right shoulder arthroscopy  2. When is this surgery scheduled? surgery in December.. Exact date not specified.  3. Are there any medications that need to be held prior to surgery and how long? Patient is currently on eliquis. Office states they would appreciate evaluation of this patient and a letter of medical clearance, surgery will not be schedule until received. When should patient stop eliquis  4. Practice name and name of physician performing surgery? [facility]. Doctor not specified.   5. What is your office phone and fax number? P: [phone]. F: [phone].  6. Anesthesia type (None, local, MAC, general) ? Not specified.   Stephannie Peters 10/23/2016, 12:54 PM  _________________________________________________________________   (provider comments below)

## 2016-10-23 NOTE — Telephone Encounter (Signed)
Patient with diagnosis of atrial fibrillation on Eliquis for anticoagulation.    Procedure: right shoulder arthroscopy Date of procedure: tbd  CHADS2 score of 1 (HTN);  CHADS2-VASc score of  2 (HTN, AGE)  CrCl 103 Platelet count 190  Per office protocol, patient can hold Eliquis for 2 days prior to procedure.    Patient should restart Eliquis on the evening of procedure or day after, at discretion of procedure MD

## 2016-10-23 NOTE — Telephone Encounter (Signed)
Pt would like a letter for disability.

## 2016-10-23 NOTE — Progress Notes (Signed)
Office Visit Note   Patient: Curtis [name]           Date of Birth: 02/05/1945           MRN: [id number] Visit Date: 10/23/2016              Requested by: Kathyrn Drown, MD [place], [place] [address] PCP: Kathyrn Drown, MD   Assessment & Plan: Visit Diagnoses:  1. Chronic right shoulder pain     Plan: MRI scan right shoulder performed 10/11/2016. There is moderate tendinosis of the supraspinatus tendon with fraying along the bursal surface. Infraspinatus teres minor and subscapularis tendons were intact. No atrophy or fatty replacement. Mild tendinosis of the intra-articular portion long head of the biceps tendon. Moderate arthropathy of the acromioclavicular joint. No joint effusion or chondral defect in the glenohumeral joint. Long discussion regarding findings and chronic pain. Mr. Nordin would like to proceed with shoulder arthroscopy. This would include an arthroscopic subacromial decompression, distal clavicle resection and possible mini open rotator cuff tear repair the supraspinatus and significant I discussed the surgery, the outpatient nature. Expectations ,need for physical therapy and potential complications He would like to proceed with an pursue any further conservative by a nonoperative treatment Follow-Up Instructions: Return will schedule surgery right shoulder.   Orders:  No orders of the defined types were placed in this encounter.  No orders of the defined types were placed in this encounter.     Procedures: No procedures performed   Clinical Data: No additional findings.   Subjective: Chief Complaint  Patient presents with  . Right Shoulder - Pain  HPI  see below  Review of Systems  Mr [facility] continues to have pain in his right shoulder to the point of compromise. The subacromial cortisone injection performed on his last office visit provided little if any help. He still having difficulty sleeping on that  shoulder and raising his arm over his head. He's not had any neck pain. Denies any numbness or tingling in either hand. He did have an MRI scan performed on 10/ 5 with the results as above. Mr. Haque is on eliquis.I will give him a clearance form for his cardiologist   Objective: Vital Signs: BP 122/74   Pulse 71   Resp 14   Ht 6' (1.829 m)   Wt 225 lb (102.1 kg)   BMI 30.52 kg/m   Physical Exam  Ortho Examawake alert and oriented 3 comfortable sitting. Painful arc of overhead motion with his right shoulder. No crepitation. Positive empty can testing. Positive impingement testing on the extremes of internal/external rotation. Hypertrophic changes about the acromioclavicular joint was some mild tenderness. Pain in the subacromial region with abduction. Good grip and good release. Neurovascular exam intact. Skin intact. I sepsis intact   Specialty Comments:  No specialty comments available.  Imaging: No results found.   PMFS History: Patient Active Problem List   Diagnosis Date Noted  . Chronic respiratory failure ([place]) 04/26/2016  . Hyperbilirubinemia 04/26/2016  . H/O asbestos exposure 03/15/2016  . Persistent atrial fibrillation ([place]) 02/19/2016  . Atrial fibrillation, persistent ([place]) 02/19/2016  . IPF (idiopathic pulmonary fibrosis) ([place]) 02/09/2016  . Incidental lung nodule, greater than or equal to 89mm 02/09/2016  . Bell's palsy 08/03/2015  . Insomnia 06/08/2015  . Right trigeminal neuralgia 04/01/2014  . Chronic back pain 03/01/2014  . Elevated transaminase level 06/25/2013  . Obesity (BMI 30-39.9) 09/09/2012  . Chronic neck pain 09/09/2012  .  Symptomatic cholelithiasis 04/14/2012  . Chest pain   . Statin intolerance   . Dyslipidemia   . GERD (gastroesophageal reflux disease)   . Hypertension   . INTERSTITIAL LUNG DISEASE 03/16/2010  . PSORIASIS 03/16/2010   Past Medical History:  Diagnosis Date  . Arthritis   . Asthma   . Atrial fibrillation ([place])     . Cataract   . Cervical spondylosis   . Chest pain    Emergency room October 24, 2011, no MI  //   Nuclear, October, 2013, adenosine, EF 61%, no scar or ischemia  . Dyslipidemia    Statin intolerant  . GERD (gastroesophageal reflux disease)   . Glaucoma   . Hearing loss   . Hypertension   . Hypertension   . Idiopathic pulmonary fibrosis ([place])   . Interstitial pulmonary fibrosis ([place]) 2012  . Left shoulder pain 2011  . Psoriasis   . Pulmonary fibrosis ([place])   . Statin intolerance     Family History  Problem Relation Age of Onset  . Hypertension Mother   . Congestive Heart Failure Mother   . COPD Mother   . Stroke Father   . Cancer Sister        breast    Past Surgical History:  Procedure Laterality Date  . BRAIN SURGERY    . CARDIOVERSION N/A 12/13/2015   Procedure: CARDIOVERSION;  Surgeon: Josue Hector, MD;  Location: [facility];  Service: Cardiovascular;  Laterality: N/A;  . CATARACT EXTRACTION W/PHACO Right 06/11/2012   Procedure: CATARACT EXTRACTION PHACO AND INTRAOCULAR LENS PLACEMENT ([place]);  Surgeon: Tonny Branch, MD;  Location[facility];  Service: Ophthalmology;  Laterality: Right;  CDE:  14.43  . CATARACT EXTRACTION W/PHACO Left 06/29/2012   Procedure: CATARACT EXTRACTION PHACO AND INTRAOCULAR LENS PLACEMENT ([facility]);  Surgeon: Tonny Branch, MD;  Location: [facility];  Service: Ophthalmology;  Laterality: Left;  CDE:14.11  . CHOLECYSTECTOMY  05/08/2012  . CHOLECYSTECTOMY N/A 05/08/2012   Procedure: LAPAROSCOPIC CHOLECYSTECTOMY;  Surgeon: Harl Bowie, MD;  Location: [facility];  Service: General;  Laterality: N/A;  . COLONOSCOPY    . LIVER BIOPSY N/A 05/08/2012   Procedure: LIVER BIOPSY;  Surgeon: Harl Bowie, MD;  Location: [facility];  Service: General;  Laterality: N/A;  . NECK SURGERY     cervical disc with cliip   Social History   Occupational History  . Not on file.   Social History Main Topics  . Smoking status: Former Smoker    Packs/day: 0.50    Years: 52.00     Types: Cigarettes    Quit date: 09/08/2003  . Smokeless tobacco: Never Used  . Alcohol use No     Comment: quit drinking 2004  . Drug use: No  . Sexual activity: Yes    Birth control/ protection: None

## 2016-10-23 NOTE — Telephone Encounter (Signed)
    Chart reviewed as part of pre-operative protocol coverage. Patient was contacted 10/23/2016 in reference to pre-operative risk assessment for pending surgery as outlined below. Pt needing to undergo right shoulder surgery.  Curtis [name] was last seen on 09/03/2016 by Dr. Curt Bears.  Visit was < 2 months ago, however no statement made regarding surgical clearance, thus phone call made today to assess for new cardiac symptoms and assess with DASI.   He has h/o PAF. Previously on Tikosyn but discontinued due to side effects, which resolved after discontinuation. Per office note 09/03/16, he was doing well w/o symptoms of recurrent PAF. No cardiac symptoms. No documented h/o CAD nor CHF. He had a nonischemic NST and normal LVEF by most recent echo in 2017. AS noted but mild.   According to the Revised Cardiac Risk Index (RCRI), his Perioperative Risk of Major Cardiac Events is 0.4% High Risk Surgery = No H/o IHD = No H/o CHF = No H/o CVD = No DM w/ Insulin = No SCr > 2 = No   Will need to confirm with patient via phone that he has not had any new cardiac symptoms and can perform > 4 METs of physical activity w/o exertional symptoms, by use of the DASI before decision is made regarding need for office appt prior to surgical clearance.   Will await pt callback. Try again tomorrow.    Lyda Jester, PA-C 10/23/2016, 3:38 PM

## 2016-10-24 NOTE — Telephone Encounter (Signed)
I talked with his wife in told her that I would like a brief written male statement from him regarding reasons for disability at this point. I believe the patient is disabled but this information can be helpful-we are awaiting that.

## 2016-10-24 NOTE — Telephone Encounter (Signed)
Cardiac clearance printed and faxed to [facility]

## 2016-10-24 NOTE — Telephone Encounter (Signed)
      Chart reviewed as part of pre-operative protocol coverage. Called patient today. He does not having any cardiac symptoms. DASI 7.59 METs.   Therefore, based on ACC/AHA guidelines, the patient would be at acceptable risk for the planned procedure without further cardiovascular testing.   [place], PA 10/24/2016, 4:34 PM

## 2016-11-04 DIAGNOSIS — J8410 Pulmonary fibrosis, unspecified: Secondary | ICD-10-CM | POA: Diagnosis not present

## 2016-11-20 ENCOUNTER — Encounter: Payer: Self-pay | Admitting: Family Medicine

## 2016-11-20 ENCOUNTER — Ambulatory Visit (INDEPENDENT_AMBULATORY_CARE_PROVIDER_SITE_OTHER): Payer: PPO | Admitting: Family Medicine

## 2016-11-20 VITALS — BP 132/82 | Ht 72.0 in | Wt 221.2 lb

## 2016-11-20 DIAGNOSIS — I10 Essential (primary) hypertension: Secondary | ICD-10-CM

## 2016-11-20 DIAGNOSIS — M542 Cervicalgia: Secondary | ICD-10-CM | POA: Diagnosis not present

## 2016-11-20 DIAGNOSIS — G8929 Other chronic pain: Secondary | ICD-10-CM

## 2016-11-20 DIAGNOSIS — M545 Low back pain: Secondary | ICD-10-CM | POA: Diagnosis not present

## 2016-11-20 DIAGNOSIS — J84112 Idiopathic pulmonary fibrosis: Secondary | ICD-10-CM | POA: Diagnosis not present

## 2016-11-20 DIAGNOSIS — I481 Persistent atrial fibrillation: Secondary | ICD-10-CM | POA: Diagnosis not present

## 2016-11-20 DIAGNOSIS — E785 Hyperlipidemia, unspecified: Secondary | ICD-10-CM | POA: Diagnosis not present

## 2016-11-20 DIAGNOSIS — I4819 Other persistent atrial fibrillation: Secondary | ICD-10-CM

## 2016-11-20 MED ORDER — HYDROCODONE-ACETAMINOPHEN 10-325 MG PO TABS: 1.0000 | ORAL_TABLET | Freq: Three times a day (TID) | ORAL | 0 refills | Status: DC | PRN

## 2016-11-20 NOTE — Progress Notes (Signed)
   Subjective:    Patient ID: Curtis [name], male    DOB: 10-17-1945, 71 y.o.   MRN: [id number]  HPI Patient arrives for a follow up on weight loss. Patient states he is feeling better. Patient states he is able to eat and gaining weight. Patient uses albuterol as needed but states his breathing is a struggle for him he follows through with specialist on a regular basis Reflux has been under good control with the Prilosec he takes it on a regular basis denies problems Has atrial fibrillation and takes Eliquis to prevent strokes no bleeding complications He also has blood pressure issues takes medicine on a regular basis keeps his heart rate and blood pressure under good control He has chronic low back pain and chronic neck pain takes hydrocodone no greater than 3 times per day denies abusing the medicine it does help him function better  Review of Systems  Constitutional: Negative for activity change.  Gastrointestinal: Negative for abdominal pain and vomiting.  Neurological: Negative for weakness.  Psychiatric/Behavioral: Negative for confusion.       Objective:   Physical Exam  Constitutional: He appears well-nourished.  Cardiovascular: Normal rate and normal heart sounds.  No murmur heard. Pulmonary/Chest: Effort normal and breath sounds normal.  Musculoskeletal: He exhibits no edema.  Lymphadenopathy:    He has no cervical adenopathy.  Neurological: He is alert.  Psychiatric: His behavior is normal.  Vitals reviewed.     25 minutes was spent with the patient. Greater than half the time was spent in discussion and answering questions and counseling regarding the issues that the patient came in for today.     Assessment & Plan:  Blood pressure taking medication watching diet under good control continue current measures  Atrial fibrillation heart rate controlled.  Taking Eliquis to prevent strokes no bleeding issues  Idiopathic pulmonary fibrosis patient is  struggling with this but tolerating things as best as he can follows through with specialist.  Unable to work.  Patient is disabled  Chronic low back pain without sciatica patient does use pain medicine no greater than 3/day it does help him function better he denies abusing it drug registry was checked 3 prescriptions were written follow-up in 3 months  Chronic neck pain with impingement patient uses hydrocodone to help with pain management denies abusing the medicine please see above  Hyperlipidemia patient is due for a lipid profile await the results  Follow-up in 3 months

## 2016-11-21 LAB — CBC WITH DIFFERENTIAL/PLATELET
BASOS: 0 %
Basophils Absolute: 0 10*3/uL (ref 0.0–0.2)
EOS (ABSOLUTE): 0.4 10*3/uL (ref 0.0–0.4)
EOS: 4 %
HEMATOCRIT: 46.9 % (ref 37.5–51.0)
HEMOGLOBIN: 15.2 g/dL (ref 13.0–17.7)
IMMATURE GRANS (ABS): 0 10*3/uL (ref 0.0–0.1)
IMMATURE GRANULOCYTES: 0 %
LYMPHS: 32 %
Lymphocytes Absolute: 2.9 10*3/uL (ref 0.7–3.1)
MCH: 31 pg (ref 26.6–33.0)
MCHC: 32.4 g/dL (ref 31.5–35.7)
MCV: 96 fL (ref 79–97)
MONOCYTES: 9 %
MONOS ABS: 0.8 10*3/uL (ref 0.1–0.9)
NEUTROS PCT: 55 %
Neutrophils Absolute: 4.9 10*3/uL (ref 1.4–7.0)
Platelets: 148 10*3/uL — ABNORMAL LOW (ref 150–379)
RBC: 4.91 x10E6/uL (ref 4.14–5.80)
RDW: 14 % (ref 12.3–15.4)
WBC: 8.9 10*3/uL (ref 3.4–10.8)

## 2016-11-21 LAB — BASIC METABOLIC PANEL
BUN/Creatinine Ratio: 8 — ABNORMAL LOW (ref 10–24)
BUN: 7 mg/dL — AB (ref 8–27)
CO2: 25 mmol/L (ref 20–29)
CREATININE: 0.91 mg/dL (ref 0.76–1.27)
Calcium: 9.1 mg/dL (ref 8.6–10.2)
Chloride: 103 mmol/L (ref 96–106)
GFR calc Af Amer: 96 mL/min/{1.73_m2} (ref >59)
GFR, EST NON AFRICAN AMERICAN: 83 mL/min/{1.73_m2} (ref >59)
Glucose: 83 mg/dL (ref 65–99)
Potassium: 3.8 mmol/L (ref 3.5–5.2)
SODIUM: 143 mmol/L (ref 134–144)

## 2016-11-21 LAB — LIPID PANEL
CHOLESTEROL TOTAL: 128 mg/dL (ref 100–199)
Chol/HDL Ratio: 6.1 ratio — ABNORMAL HIGH (ref 0.0–5.0)
HDL: 21 mg/dL — AB (ref >39)
LDL Calculated: 81 mg/dL (ref 0–99)
Triglycerides: 128 mg/dL (ref 0–149)
VLDL CHOLESTEROL CAL: 26 mg/dL (ref 5–40)

## 2016-11-25 ENCOUNTER — Telehealth: Payer: Self-pay | Admitting: Cardiology

## 2016-11-25 DIAGNOSIS — H524 Presbyopia: Secondary | ICD-10-CM | POA: Diagnosis not present

## 2016-11-25 DIAGNOSIS — H52203 Unspecified astigmatism, bilateral: Secondary | ICD-10-CM | POA: Diagnosis not present

## 2016-11-25 DIAGNOSIS — Z961 Presence of intraocular lens: Secondary | ICD-10-CM | POA: Diagnosis not present

## 2016-11-25 NOTE — Telephone Encounter (Signed)
I can provide patient with a few samples but since he is in the donut hole I need to address patient assistance. Will Dr Curt Bears do this or should it be addressed by the afib clinic? Looks like patient was started on eliquis by Dr Johnsie Cancel but has been following up with Dr Curt Bears and Roderic Palau. Please advise. Thanks, MI

## 2016-11-25 NOTE — Telephone Encounter (Signed)
New Message     Patient calling the office for samples of medication:   1.  What medication and dosage are you requesting samples for? eliquis 5mg   2.  Are you currently out of this medication?  1 day left , in donut hole can not afford to get refill

## 2016-11-26 NOTE — Telephone Encounter (Signed)
Dr. Curt Bears would be glad to sign patient assistance paperwork for Eliquis   (pt is taking this for AFib) Ok to fill under [employer]

## 2016-11-26 NOTE — Telephone Encounter (Signed)
Pt called back concerning his samples of Eliquis 5 mg tablets. I informed that pt that we will leave 2 weeks of samples of Eliquis, along with patient assistance forms, for pt to fill out and bring back to see if pt is approved for a pt assistance to get medication. I advised the pt that if he has any other problems, questions or concerns to call the office. Pt verbalized understanding.

## 2016-12-04 ENCOUNTER — Telehealth: Payer: Self-pay | Admitting: Cardiology

## 2016-12-04 ENCOUNTER — Encounter: Payer: Self-pay | Admitting: Family Medicine

## 2016-12-04 ENCOUNTER — Telehealth: Payer: Self-pay | Admitting: Family Medicine

## 2016-12-04 NOTE — Telephone Encounter (Signed)
Called twice, both voicemail, I did not leave message. Please have patient call our office and talk to preop APP of the day, interestingly he was just cleared for R shoulder surgery on 10/24/2016, now receiving a clearance letter for total hip replacement. Need to verify with patient, as long as there is no change, his previous clearance would still stand.   Will defer to pharmacist regarding eliquis. H/o persistent atrial fibrillation, no prior h/o CAD.   Hilbert Corrigan PA Pager: [phone]

## 2016-12-04 NOTE — Telephone Encounter (Signed)
The patient had requested a letter that he was going to take to disability.  I dictated a letter.  Please give it to him.  Let him know that if he needs anything else to let us know.

## 2016-12-04 NOTE — Telephone Encounter (Signed)
Returned call to patient. Patient is NOT having hip replacement JUST R shoulder surgery.  Patient only wanted to know Eliquis instructions. Advised him of instructions from the pharmacist below. Advised him that he would receive instructions on when to restart medication from his surgeon, Dr Durward Fortes.  Routing encounter to Dr Rudene Anda office.

## 2016-12-04 NOTE — Telephone Encounter (Signed)
Ok to hold for 3 days-ok to restart day after surgery

## 2016-12-04 NOTE — Telephone Encounter (Signed)
Pt takes Eliquis for afib with CHADS2VASc score of 2 (age, HTN). Renal function is normal. Ok to hold Eliquis for 3 days prior to procedure per protocol. 

## 2016-12-04 NOTE — Telephone Encounter (Signed)
° °  [facility] Pre-operative Risk Assessment    Request for surgical clearance:  1. What type of surgery is being performed?Total Hip Replacement   2. When is this surgery scheduled? 12-12-16   3. Are there any medications that need to be held prior to surgery and how long?Can pt stop his Eliquis? If so when? Also does pt need to take another medicine to replace the Eliquis   4. Practice name and name of physician performing surgery? Dr [name]  5. What is your office phone and fax number? [phone] and fax is [phone]   6. Anesthesia type (None, local, MAC, general) ? General   Glyn Ade 12/04/2016, 10:24 AM  _________________________________________________________________   (provider comments below)

## 2016-12-05 DIAGNOSIS — J8410 Pulmonary fibrosis, unspecified: Secondary | ICD-10-CM | POA: Diagnosis not present

## 2016-12-06 NOTE — Telephone Encounter (Signed)
    Chart reviewed as part of pre-operative protocol coverage.  See notes below. Appears there was confusion with chart as initial intake information indicated patient having hip surgery which is not the case. He is having shoulder surgery and was previously cleared for this per phone note 10/23/16.  Chart reviewed as part of pre-operative protocol coverage. Called patient today. He does not having any cardiac symptoms. DASI 7.59 METs.   Therefore, based on ACC/AHA guidelines, the patient would be at acceptable risk for the planned procedure without further cardiovascular testing.   Fisher, PA 10/24/2016, 4:34 PM   I reached out to patient and he reiterates he is doing fine without any cardiovascular symptoms of CP, SOB, dizziness, syncope or anything different than when previously cleared for the same surgery. OK to proceed without further CV testing. Notes below indicate he has already received instructions on anticoagulation as well.  Will route this bundled recommendation to requesting provider via Epic fax function. Please call with questions.   Charlie Pitter, PA-C  12/06/2016, 9:35 AM

## 2016-12-11 ENCOUNTER — Encounter (HOSPITAL_COMMUNITY): Payer: Self-pay | Admitting: *Deleted

## 2016-12-11 ENCOUNTER — Other Ambulatory Visit: Payer: Self-pay

## 2016-12-11 NOTE — Progress Notes (Signed)
Spoke with pt for pre-op call. Pt states he has hx of "small" heart attack "years ago". Cardiologist is Dr. Curt Bears. Pt also has hx of A-fib and is on Eliquis. Last dose per pt was 12/08/16. Cardiac clearance noted in Epic on 10/24/16. Pt denies being diabetic.

## 2016-12-11 NOTE — H&P (Signed)
Curtis Fears, MD   [name], PA-C [address], [place], [place]  [address]                             [phone]   ORTHOPAEDIC HISTORY & PHYSICAL  Curtis [name] MRN:  [id number] DOB/SEX:  Aug 12, 1945/male  CHIEF COMPLAINT:  Painful right shoulder  HISTORY: Curtis [name] continues to have pain in his right shoulder to the point of compromise. The subacromial cortisone injection performed on his last office visit provided little if any help. He still having difficulty sleeping on that shoulder and raising his arm over his head. He's not had any neck pain. Denies any numbness or tingling in either hand. He did have an MRI scan performed on 10/ 5 with the results MRI scan right shoulder performed 10/11/2016. There is moderate tendinosis of the supraspinatus tendon with fraying along the bursal surface. Infraspinatus teres minor and subscapularis tendons were intact. No atrophy or fatty replacement. Mild tendinosis of the intra-articular portion long head of the biceps tendon. Moderate arthropathy of the acromioclavicular joint. No joint effusion or chondral defect in the glenohumeral joint. Because of failed conservative treatment he is indicated for arthroscopic subacromial decompression distal clavicle excision and mini open rotator cuff repair.   PAST MEDICAL HISTORY: Patient Active Problem List   Diagnosis Date Noted  . Chronic respiratory failure ([place]) 04/26/2016  . Hyperbilirubinemia 04/26/2016  . H/O asbestos exposure 03/15/2016  . Persistent atrial fibrillation ([place]) 02/19/2016  . Atrial fibrillation, persistent ([place]) 02/19/2016  . IPF (idiopathic pulmonary fibrosis) ([place]) 02/09/2016  . Incidental lung nodule, greater than or equal to 65mm 02/09/2016  . Bell's palsy 08/03/2015  . Insomnia 06/08/2015  . Right trigeminal neuralgia 04/01/2014  . Chronic back pain 03/01/2014  . Elevated transaminase level 06/25/2013  . Obesity (BMI 30-39.9) 09/09/2012  .  Chronic neck pain 09/09/2012  . Symptomatic cholelithiasis 04/14/2012  . Chest pain   . Statin intolerance   . Dyslipidemia   . GERD (gastroesophageal reflux disease)   . Hypertension   . INTERSTITIAL LUNG DISEASE 03/16/2010  . PSORIASIS 03/16/2010   Past Medical History:  Diagnosis Date  . Arthritis   . Asthma   . Atrial fibrillation ([place])   . Cataract   . Cervical spondylosis   . Chest pain    Emergency room October 24, 2011, no MI  //   Nuclear, October, 2013, adenosine, EF 61%, no scar or ischemia  . Dyslipidemia    Statin intolerant  . Dyspnea   . GERD (gastroesophageal reflux disease)   . Glaucoma   . Hearing loss   . Hypertension   . Hypertension   . Idiopathic pulmonary fibrosis ([place])   . Interstitial pulmonary fibrosis ([place]) 2012  . Left shoulder pain 2011  . Myocardial infarction ([place])   . Psoriasis   . Pulmonary fibrosis ([place])   . Statin intolerance    Past Surgical History:  Procedure Laterality Date  . BRAIN SURGERY    . CARDIOVERSION N/A 12/13/2015   Procedure: CARDIOVERSION;  Surgeon: Josue Hector, MD;  Location: [facility];  Service: Cardiovascular;  Laterality: N/A;  . CATARACT EXTRACTION W/PHACO Right 06/11/2012   Procedure: CATARACT EXTRACTION PHACO AND INTRAOCULAR LENS PLACEMENT ([place]);  Surgeon: Tonny Branch, MD;  Location: [facility];  Service: Ophthalmology;  Laterality: Right;  CDE:  14.43  . CATARACT EXTRACTION W/PHACO Left 06/29/2012   Procedure: CATARACT EXTRACTION PHACO AND INTRAOCULAR LENS PLACEMENT (  [place]);  Surgeon: Tonny Branch, MD;  Location: [facility];  Service: Ophthalmology;  Laterality: Left;  CDE:14.11  . CHOLECYSTECTOMY  05/08/2012  . CHOLECYSTECTOMY N/A 05/08/2012   Procedure: LAPAROSCOPIC CHOLECYSTECTOMY;  Surgeon: Harl Bowie, MD;  Location: [facility];  Service: General;  Laterality: N/A;  . COLONOSCOPY    . LIVER BIOPSY N/A 05/08/2012   Procedure: LIVER BIOPSY;  Surgeon: Harl Bowie, MD;  Location: [facility];  Service: General;   Laterality: N/A;  . NECK SURGERY     cervical disc with cliip     MEDICATIONS:   No medications prior to admission.   No current facility-administered medications for this encounter.    Current Outpatient Medications  Medication Sig Dispense Refill  . albuterol (PROVENTIL HFA;VENTOLIN HFA) 108 (90 Base) MCG/ACT inhaler Inhale 2 puffs into the lungs every 6 (six) hours as needed for wheezing. 1 Inhaler 2  . apixaban (ELIQUIS) 5 MG TABS tablet Take 1 tablet (5 mg total) by mouth 2 (two) times daily. 60 tablet 6  . CARTIA XT 120 MG 24 hr capsule TAKE 1 CAPSULE(120 MG) BY MOUTH DAILY 30 capsule 10  . Coal Tar Extract [phone] PSORIASIS MEDICATED EX) Apply 1 application topically as needed (for psoriasis).    . fexofenadine (ALLEGRA) 180 MG tablet Take 180 mg by mouth daily as needed for allergies or rhinitis.    . fluticasone (FLONASE) 50 MCG/ACT nasal spray Place 2 sprays into both nostrils daily. (Patient taking differently: Place 1 spray into both nostrils daily. ) 16 g 5  . HYDROcodone-acetaminophen (NORCO) 10-325 MG tablet Take 1 tablet 3 (three) times daily as needed by mouth. (Patient taking differently: Take 1 tablet by mouth 3 (three) times daily as needed for moderate pain. ) 90 tablet 0  . omeprazole (PRILOSEC) 40 MG capsule Take 40 mg by mouth daily.     . OXYGEN Inhale 2.25 L into the lungs as needed (for shortness of breath).    Curtis [name] Glycol-Propyl Glycol (SYSTANE ULTRA OP) Place 1 drop into both eyes daily.    Curtis [name] Glycol-Propyl Glycol (SYSTANE) 0.4-0.3 % GEL ophthalmic gel Place 1 application into both eyes as needed (for dry eyes).    . ALPRAZolam (XANAX) 0.5 MG tablet Take one half to one bid prn. Caution drowiness (Patient not taking: Reported on 11/20/2016) 20 tablet 0    ALLERGIES:   Allergies  Allergen Reactions  . Pirfenidone Other (See Comments)    Elevated bilirubin  . Acyclovir And Related Other (See Comments)    Unknown  . Oxycodone Itching  .  Pravastatin Other (See Comments)    Chills    . Simvastatin Other (See Comments)    Chills    . Prednisone Anxiety and Other (See Comments)    Irritation  . Tegretol [Carbamazepine] Other (See Comments)    dizziness   REVIEW OF SYSTEMS:  Constitutional: Negative for fatigue.  HENT: Negative for hearing loss.   Respiratory: Positive for chest tightness and shortness of breath. Negative for apnea.   Cardiovascular: Positive for chest pain, palpitations and leg swelling.  Gastrointestinal: Negative for blood in stool, constipation and diarrhea.  Genitourinary: Negative for difficulty urinating.  Musculoskeletal: Positive for back pain, joint swelling, neck pain and neck stiffness. Negative for arthralgias and myalgias.  Allergic/Immunologic: Positive for immunocompromised state.  Neurological: Positive for dizziness, light-headedness and headaches. Negative for weakness and numbness.  Hematological: Does not bruise/bleed easily.  Psychiatric/Behavioral: Negative for sleep disturbance. The patient is not nervous/anxious.  FAMILY HISTORY:   Family History  Problem Relation Age of Onset  . Hypertension Mother   . Congestive Heart Failure Mother   . COPD Mother   . Stroke Father   . Cancer Sister        breast    SOCIAL HISTORY:   Social History   Tobacco Use  . Smoking status: Former Smoker    Packs/day: 0.50    Years: 52.00    Pack years: 26.00    Types: Cigarettes    Last attempt to quit: 09/08/2003    Years since quitting: 13.2  . Smokeless tobacco: Never Used  Substance Use Topics  . Alcohol use: No    Comment: quit drinking 2004      EXAMINATION: Vital signs in last 24 hours:    Head is normocephalic.   Eyes:  Pupils equal, round and reactive to light and accommodation.  Extraocular intact. ENT: Ears, nose, and throat were benign.   Neck: supple, no bruits were noted.   Chest: good expansion.   Lungs: essentially clear.   Cardiac: regular rhythm  and rate, normal S1, S2.  No murmurs appreciated. Pulses :  2+ bilateral and symmetric in UPPER extremities. Abdomen is scaphoid, soft, nontender, no masses palpable, normal bowel sounds present. CNS:  He is oriented x3 and cranial nerves II-XII grossly intact. Breast, rectal, and genital exams: not performed and not indicated for an orthopedic evaluation. Musculoskeletal: Positive impingement and positive empty can testing right shoulder. No popping or clicking. Able to raise his arm over his head with a painful arc of motion.skin intact.good grip and release. No weakness.Mild tenderness at the acromioclavicular joint and anterior subacromial region.psoriatic skin lesions both forearms   Imaging Review No results found.  ASSESSMENT: right rotator cuff tear with acromioclavicular OA, tendinosis the biceps tendon  Past Medical History:  Diagnosis Date  . Arthritis   . Asthma   . Atrial fibrillation ([place])   . Cataract   . Cervical spondylosis   . Chest pain    Emergency room October 24, 2011, no MI  //   Nuclear, October, 2013, adenosine, EF 61%, no scar or ischemia  . Dyslipidemia    Statin intolerant  . Dyspnea   . GERD (gastroesophageal reflux disease)   . Glaucoma   . Hearing loss   . Hypertension   . Hypertension   . Idiopathic pulmonary fibrosis ([place])   . Interstitial pulmonary fibrosis ([place]) 2012  . Left shoulder pain 2011  . Myocardial infarction ([place])   . Psoriasis   . Pulmonary fibrosis ([place])   . Statin intolerance     PLAN: Plan for right arthroscopic subacromial decompression with distal clavicle excision and rotator cuff repair  The procedure,  risks, and benefits of surgery were presented and reviewed. The risks including but not limited to infection, blood clots, vascular and nerve injury, stiffness,  among others were discussed. The patient acknowledged the explanation, agreed to proceed.   Mike Craze [place], [place]  Mike Craze. [place], [place] [phone]  12/11/2016 6:08 PM

## 2016-12-12 ENCOUNTER — Ambulatory Visit (HOSPITAL_COMMUNITY)
Admission: RE | Admit: 2016-12-12 | Discharge: 2016-12-12 | Disposition: A | Discharge status: Home / Self Care | Payer: PPO | Source: Ambulatory Visit | Attending: Orthopaedic Surgery | Admitting: Orthopaedic Surgery

## 2016-12-12 ENCOUNTER — Ambulatory Visit (HOSPITAL_COMMUNITY): Payer: PPO | Admitting: Anesthesiology

## 2016-12-12 ENCOUNTER — Encounter (HOSPITAL_COMMUNITY)
Admission: RE | Disposition: A | Discharge status: Home / Self Care | Payer: Self-pay | Source: Ambulatory Visit | Attending: Orthopaedic Surgery

## 2016-12-12 ENCOUNTER — Encounter (HOSPITAL_COMMUNITY): Payer: Self-pay | Admitting: *Deleted

## 2016-12-12 DIAGNOSIS — Z9842 Cataract extraction status, left eye: Secondary | ICD-10-CM | POA: Insufficient documentation

## 2016-12-12 DIAGNOSIS — J84112 Idiopathic pulmonary fibrosis: Secondary | ICD-10-CM | POA: Insufficient documentation

## 2016-12-12 DIAGNOSIS — E669 Obesity, unspecified: Secondary | ICD-10-CM | POA: Insufficient documentation

## 2016-12-12 DIAGNOSIS — I252 Old myocardial infarction: Secondary | ICD-10-CM | POA: Insufficient documentation

## 2016-12-12 DIAGNOSIS — M7501 Adhesive capsulitis of right shoulder: Secondary | ICD-10-CM | POA: Insufficient documentation

## 2016-12-12 DIAGNOSIS — H409 Unspecified glaucoma: Secondary | ICD-10-CM | POA: Insufficient documentation

## 2016-12-12 DIAGNOSIS — Z888 Allergy status to other drugs, medicaments and biological substances status: Secondary | ICD-10-CM | POA: Insufficient documentation

## 2016-12-12 DIAGNOSIS — M19011 Primary osteoarthritis, right shoulder: Secondary | ICD-10-CM | POA: Insufficient documentation

## 2016-12-12 DIAGNOSIS — G8929 Other chronic pain: Secondary | ICD-10-CM | POA: Insufficient documentation

## 2016-12-12 DIAGNOSIS — Z9889 Other specified postprocedural states: Secondary | ICD-10-CM | POA: Insufficient documentation

## 2016-12-12 DIAGNOSIS — M7541 Impingement syndrome of right shoulder: Secondary | ICD-10-CM | POA: Diagnosis not present

## 2016-12-12 DIAGNOSIS — Z961 Presence of intraocular lens: Secondary | ICD-10-CM | POA: Insufficient documentation

## 2016-12-12 DIAGNOSIS — J9610 Chronic respiratory failure, unspecified whether with hypoxia or hypercapnia: Secondary | ICD-10-CM | POA: Insufficient documentation

## 2016-12-12 DIAGNOSIS — Z9841 Cataract extraction status, right eye: Secondary | ICD-10-CM | POA: Insufficient documentation

## 2016-12-12 DIAGNOSIS — I481 Persistent atrial fibrillation: Secondary | ICD-10-CM | POA: Diagnosis not present

## 2016-12-12 DIAGNOSIS — Z885 Allergy status to narcotic agent status: Secondary | ICD-10-CM | POA: Insufficient documentation

## 2016-12-12 DIAGNOSIS — Z79899 Other long term (current) drug therapy: Secondary | ICD-10-CM | POA: Insufficient documentation

## 2016-12-12 DIAGNOSIS — Z8249 Family history of ischemic heart disease and other diseases of the circulatory system: Secondary | ICD-10-CM | POA: Insufficient documentation

## 2016-12-12 DIAGNOSIS — I10 Essential (primary) hypertension: Secondary | ICD-10-CM | POA: Diagnosis not present

## 2016-12-12 DIAGNOSIS — J45909 Unspecified asthma, uncomplicated: Secondary | ICD-10-CM | POA: Diagnosis not present

## 2016-12-12 DIAGNOSIS — Z823 Family history of stroke: Secondary | ICD-10-CM | POA: Insufficient documentation

## 2016-12-12 DIAGNOSIS — Z87891 Personal history of nicotine dependence: Secondary | ICD-10-CM | POA: Insufficient documentation

## 2016-12-12 DIAGNOSIS — M19019 Primary osteoarthritis, unspecified shoulder: Secondary | ICD-10-CM | POA: Diagnosis present

## 2016-12-12 DIAGNOSIS — K219 Gastro-esophageal reflux disease without esophagitis: Secondary | ICD-10-CM | POA: Insufficient documentation

## 2016-12-12 DIAGNOSIS — G500 Trigeminal neuralgia: Secondary | ICD-10-CM | POA: Diagnosis not present

## 2016-12-12 DIAGNOSIS — M75111 Incomplete rotator cuff tear or rupture of right shoulder, not specified as traumatic: Secondary | ICD-10-CM | POA: Diagnosis not present

## 2016-12-12 DIAGNOSIS — Z9981 Dependence on supplemental oxygen: Secondary | ICD-10-CM | POA: Insufficient documentation

## 2016-12-12 DIAGNOSIS — G8918 Other acute postprocedural pain: Secondary | ICD-10-CM | POA: Diagnosis not present

## 2016-12-12 DIAGNOSIS — Z6829 Body mass index (BMI) 29.0-29.9, adult: Secondary | ICD-10-CM | POA: Insufficient documentation

## 2016-12-12 DIAGNOSIS — Z7901 Long term (current) use of anticoagulants: Secondary | ICD-10-CM | POA: Insufficient documentation

## 2016-12-12 DIAGNOSIS — Z7951 Long term (current) use of inhaled steroids: Secondary | ICD-10-CM | POA: Insufficient documentation

## 2016-12-12 DIAGNOSIS — Z9049 Acquired absence of other specified parts of digestive tract: Secondary | ICD-10-CM | POA: Insufficient documentation

## 2016-12-12 DIAGNOSIS — L409 Psoriasis, unspecified: Secondary | ICD-10-CM | POA: Insufficient documentation

## 2016-12-12 DIAGNOSIS — Z803 Family history of malignant neoplasm of breast: Secondary | ICD-10-CM | POA: Insufficient documentation

## 2016-12-12 HISTORY — DX: Dyspnea, unspecified: R06.00

## 2016-12-12 HISTORY — PX: SHOULDER ARTHROSCOPY WITH DISTAL CLAVICLE RESECTION: SHX5675

## 2016-12-12 HISTORY — DX: Acute myocardial infarction, unspecified: I21.9

## 2016-12-12 LAB — BASIC METABOLIC PANEL
ANION GAP: 6 (ref 5–15)
BUN: 6 mg/dL (ref 6–20)
CHLORIDE: 107 mmol/L (ref 101–111)
CO2: 24 mmol/L (ref 22–32)
Calcium: 8.5 mg/dL — ABNORMAL LOW (ref 8.9–10.3)
Creatinine, Ser: 0.87 mg/dL (ref 0.61–1.24)
GFR calc Af Amer: >60 mL/min (ref >60)
GFR calc non Af Amer: >60 mL/min (ref >60)
GLUCOSE: 89 mg/dL (ref 65–99)
POTASSIUM: 3.8 mmol/L (ref 3.5–5.1)
Sodium: 137 mmol/L (ref 135–145)

## 2016-12-12 LAB — CBC
HEMATOCRIT: 39 % (ref 39.0–52.0)
HEMOGLOBIN: 13.1 g/dL (ref 13.0–17.0)
MCH: 30.6 pg (ref 26.0–34.0)
MCHC: 33.6 g/dL (ref 30.0–36.0)
MCV: 91.1 fL (ref 78.0–100.0)
Platelets: 108 10*3/uL — ABNORMAL LOW (ref 150–400)
RBC: 4.28 MIL/uL (ref 4.22–5.81)
RDW: 12.9 % (ref 11.5–15.5)
WBC: 9.7 10*3/uL (ref 4.0–10.5)

## 2016-12-12 LAB — PROTIME-INR
INR: 1.21
Prothrombin Time: 15.2 seconds (ref 11.4–15.2)

## 2016-12-12 SURGERY — SHOULDER ARTHROSCOPY WITH DISTAL CLAVICLE RESECTION
Anesthesia: Regional | Laterality: Right

## 2016-12-12 MED ORDER — CEFAZOLIN SODIUM-DEXTROSE 2-4 GM/100ML-% IV SOLN
2.0000 g | INTRAVENOUS | Status: AC
  Administered 2016-12-12: 2 g via INTRAVENOUS
  Filled 2016-12-12: qty 100

## 2016-12-12 MED ORDER — FENTANYL CITRATE (PF) 250 MCG/5ML IJ SOLN
INTRAMUSCULAR | Status: AC
  Filled 2016-12-12: qty 5

## 2016-12-12 MED ORDER — PROPOFOL 10 MG/ML IV BOLUS
INTRAVENOUS | Status: DC | PRN
  Administered 2016-12-12: 130 mg via INTRAVENOUS

## 2016-12-12 MED ORDER — APIXABAN 5 MG PO TABS: 5.0000 mg | ORAL_TABLET | Freq: Two times a day (BID) | ORAL | 0 refills | Status: DC

## 2016-12-12 MED ORDER — ONDANSETRON HCL 4 MG/2ML IJ SOLN
INTRAMUSCULAR | Status: AC
  Filled 2016-12-12: qty 2

## 2016-12-12 MED ORDER — LIDOCAINE-EPINEPHRINE 1 %-1:100000 IJ SOLN
INTRAMUSCULAR | Status: AC
  Filled 2016-12-12: qty 1

## 2016-12-12 MED ORDER — BUPIVACAINE HCL (PF) 0.25 % IJ SOLN: INTRAMUSCULAR | Status: DC | PRN

## 2016-12-12 MED ORDER — ONDANSETRON HCL 4 MG/2ML IJ SOLN: 4.0000 mg | Freq: Once | INTRAMUSCULAR | Status: DC | PRN

## 2016-12-12 MED ORDER — PROPOFOL 10 MG/ML IV BOLUS
INTRAVENOUS | Status: AC
  Filled 2016-12-12: qty 20

## 2016-12-12 MED ORDER — CHLORHEXIDINE GLUCONATE 4 % EX LIQD: 60.0000 mL | Freq: Once | CUTANEOUS | Status: DC

## 2016-12-12 MED ORDER — SODIUM CHLORIDE 0.9 % IR SOLN
Status: DC | PRN
  Administered 2016-12-12: 3000 mL

## 2016-12-12 MED ORDER — EPHEDRINE 5 MG/ML INJ
INTRAVENOUS | Status: AC
  Filled 2016-12-12: qty 10

## 2016-12-12 MED ORDER — FENTANYL CITRATE (PF) 100 MCG/2ML IJ SOLN
INTRAMUSCULAR | Status: DC | PRN
  Administered 2016-12-12 (×2): 50 ug via INTRAVENOUS
  Administered 2016-12-12: 25 ug via INTRAVENOUS

## 2016-12-12 MED ORDER — LIDOCAINE-EPINEPHRINE (PF) 1 %-1:200000 IJ SOLN: INTRAMUSCULAR | Status: DC | PRN

## 2016-12-12 MED ORDER — DEXAMETHASONE SODIUM PHOSPHATE 10 MG/ML IJ SOLN
INTRAMUSCULAR | Status: AC
  Filled 2016-12-12: qty 1

## 2016-12-12 MED ORDER — MIDAZOLAM HCL 2 MG/2ML IJ SOLN
INTRAMUSCULAR | Status: AC
  Filled 2016-12-12: qty 2

## 2016-12-12 MED ORDER — HYDROCODONE-ACETAMINOPHEN 5-325 MG PO TABS: 1.0000 | ORAL_TABLET | ORAL | 0 refills | Status: DC | PRN

## 2016-12-12 MED ORDER — ROCURONIUM BROMIDE 10 MG/ML (PF) SYRINGE
PREFILLED_SYRINGE | INTRAVENOUS | Status: AC
  Filled 2016-12-12: qty 5

## 2016-12-12 MED ORDER — ONDANSETRON HCL 4 MG/2ML IJ SOLN
INTRAMUSCULAR | Status: DC | PRN
  Administered 2016-12-12: 4 mg via INTRAVENOUS

## 2016-12-12 MED ORDER — ACETAMINOPHEN 10 MG/ML IV SOLN
1000.0000 mg | Freq: Once | INTRAVENOUS | Status: AC
  Administered 2016-12-12: 1000 mg via INTRAVENOUS
  Filled 2016-12-12: qty 100

## 2016-12-12 MED ORDER — 0.9 % SODIUM CHLORIDE (POUR BTL) OPTIME
TOPICAL | Status: DC | PRN
Start: 2016-12-12 — End: 2016-12-12
  Administered 2016-12-12: 1000 mL

## 2016-12-12 MED ORDER — BUPIVACAINE HCL (PF) 0.25 % IJ SOLN
INTRAMUSCULAR | Status: AC
  Filled 2016-12-12: qty 30

## 2016-12-12 MED ORDER — EPHEDRINE SULFATE 50 MG/ML IJ SOLN
INTRAMUSCULAR | Status: DC | PRN
  Administered 2016-12-12: 5 mg via INTRAVENOUS

## 2016-12-12 MED ORDER — SUGAMMADEX SODIUM 500 MG/5ML IV SOLN
INTRAVENOUS | Status: DC | PRN
  Administered 2016-12-12: 200 mg via INTRAVENOUS

## 2016-12-12 MED ORDER — LIDOCAINE 2% (20 MG/ML) 5 ML SYRINGE
INTRAMUSCULAR | Status: AC
  Filled 2016-12-12: qty 5

## 2016-12-12 MED ORDER — LACTATED RINGERS IV SOLN
INTRAVENOUS | Status: DC | PRN
  Administered 2016-12-12 (×2): via INTRAVENOUS

## 2016-12-12 MED ORDER — ROPIVACAINE HCL 7.5 MG/ML IJ SOLN
INTRAMUSCULAR | Status: DC | PRN
  Administered 2016-12-12: 20 mL via PERINEURAL

## 2016-12-12 MED ORDER — PHENYLEPHRINE HCL 10 MG/ML IJ SOLN
INTRAMUSCULAR | Status: DC | PRN
  Administered 2016-12-12: 25 ug/min via INTRAVENOUS

## 2016-12-12 MED ORDER — SODIUM CHLORIDE 0.9 % IV SOLN: 75.0000 mL/h | INTRAVENOUS | Status: DC

## 2016-12-12 MED ORDER — DEXAMETHASONE SODIUM PHOSPHATE 10 MG/ML IJ SOLN
INTRAMUSCULAR | Status: DC | PRN
  Administered 2016-12-12: 10 mg via INTRAVENOUS

## 2016-12-12 MED ORDER — FENTANYL CITRATE (PF) 100 MCG/2ML IJ SOLN
25.0000 ug | INTRAMUSCULAR | Status: DC | PRN
  Administered 2016-12-12: 50 ug via INTRAVENOUS

## 2016-12-12 MED ORDER — LIDOCAINE HCL (CARDIAC) 20 MG/ML IV SOLN
INTRAVENOUS | Status: DC | PRN
  Administered 2016-12-12: 60 mg via INTRAVENOUS

## 2016-12-12 MED ORDER — ROCURONIUM BROMIDE 100 MG/10ML IV SOLN
INTRAVENOUS | Status: DC | PRN
  Administered 2016-12-12: 60 mg via INTRAVENOUS

## 2016-12-12 MED ORDER — FENTANYL CITRATE (PF) 100 MCG/2ML IJ SOLN
INTRAMUSCULAR | Status: AC
  Filled 2016-12-12: qty 2

## 2016-12-12 SURGICAL SUPPLY — 50 items
BLADE GREAT WHITE 4.2 (BLADE) ×2 IMPLANT
BLADE GREAT WHITE 4.2MM (BLADE) ×1
BLADE SURG 11 STRL SS (BLADE) ×3 IMPLANT
BUR OVAL 6.0 (BURR) ×3 IMPLANT
CANNULA ACUFLEX KIT 5X76 (CANNULA) ×3 IMPLANT
CLOSURE WOUND 1/2 X4 (GAUZE/BANDAGES/DRESSINGS) ×1
COVER SURGICAL LIGHT HANDLE (MISCELLANEOUS) ×3 IMPLANT
DRAPE SHOULDER BEACH CHAIR (DRAPES) ×3 IMPLANT
DRSG EMULSION OIL 3X3 NADH (GAUZE/BANDAGES/DRESSINGS) ×3 IMPLANT
DRSG PAD ABDOMINAL 8X10 ST (GAUZE/BANDAGES/DRESSINGS) ×3 IMPLANT
DURAPREP 26ML APPLICATOR (WOUND CARE) ×3 IMPLANT
ELECT REM PT RETURN 9FT ADLT (ELECTROSURGICAL) ×3
ELECTRODE REM PT RTRN 9FT ADLT (ELECTROSURGICAL) ×1 IMPLANT
GAUZE SPONGE 4X4 12PLY STRL (GAUZE/BANDAGES/DRESSINGS) ×3 IMPLANT
GLOVE BIOGEL PI IND STRL 8 (GLOVE) ×1 IMPLANT
GLOVE BIOGEL PI IND STRL 8.5 (GLOVE) ×1 IMPLANT
GLOVE BIOGEL PI INDICATOR 8 (GLOVE) ×2
GLOVE BIOGEL PI INDICATOR 8.5 (GLOVE) ×2
GLOVE ECLIPSE 8.0 STRL XLNG CF (GLOVE) ×3 IMPLANT
GLOVE ECLIPSE 8.5 STRL (GLOVE) ×3 IMPLANT
GOWN STRL REUS W/ TWL LRG LVL3 (GOWN DISPOSABLE) ×2 IMPLANT
GOWN STRL REUS W/ TWL XL LVL3 (GOWN DISPOSABLE) ×1 IMPLANT
GOWN STRL REUS W/TWL LRG LVL3 (GOWN DISPOSABLE) ×4
GOWN STRL REUS W/TWL XL LVL3 (GOWN DISPOSABLE) ×2
IV NS IRRIG 3000ML ARTHROMATIC (IV SOLUTION) ×6 IMPLANT
KIT ELECT NEEDLE MOD JJB (NEEDLE) ×3 IMPLANT
KIT ROOM TURNOVER OR (KITS) ×3 IMPLANT
MANIFOLD NEPTUNE II (INSTRUMENTS) ×3 IMPLANT
NEEDLE 22X1 1/2 (OR ONLY) (NEEDLE) IMPLANT
NEEDLE SPNL 18GX3.5 QUINCKE PK (NEEDLE) ×3 IMPLANT
NS IRRIG 1000ML POUR BTL (IV SOLUTION) ×3 IMPLANT
PACK SHOULDER (CUSTOM PROCEDURE TRAY) ×3 IMPLANT
PAD ABD 8X10 STRL (GAUZE/BANDAGES/DRESSINGS) ×3 IMPLANT
PAD ARMBOARD 7.5X6 YLW CONV (MISCELLANEOUS) ×3 IMPLANT
SET ARTHROSCOPY TUBING (MISCELLANEOUS) ×4
SET ARTHROSCOPY TUBING LN (MISCELLANEOUS) ×2 IMPLANT
SLING ARM IMMOBILIZER LRG (SOFTGOODS) ×6 IMPLANT
STRIP CLOSURE SKIN 1/2X4 (GAUZE/BANDAGES/DRESSINGS) ×2 IMPLANT
SUT ETHIBOND 2 OS 4 DA (SUTURE) IMPLANT
SUT ETHILON 4 0 PS 2 18 (SUTURE) ×3 IMPLANT
SUT MNCRL AB 3-0 PS2 18 (SUTURE) IMPLANT
SUT VIC AB 0 CT1 27 (SUTURE) ×2
SUT VIC AB 0 CT1 27XBRD ANBCTR (SUTURE) ×1 IMPLANT
SYR CONTROL 10ML LL (SYRINGE) IMPLANT
TAPE CLOTH SURG 4X10 WHT LF (GAUZE/BANDAGES/DRESSINGS) ×3 IMPLANT
TOWEL OR 17X24 6PK STRL BLUE (TOWEL DISPOSABLE) ×3 IMPLANT
TOWEL OR 17X26 10 PK STRL BLUE (TOWEL DISPOSABLE) ×3 IMPLANT
WAND HAND CNTRL MULTIVAC 90 (MISCELLANEOUS) ×3 IMPLANT
WATER STERILE IRR 1000ML POUR (IV SOLUTION) ×3 IMPLANT
YANKAUER SUCT BULB TIP NO VENT (SUCTIONS) IMPLANT

## 2016-12-12 NOTE — Anesthesia Procedure Notes (Signed)
Anesthesia Regional Block: Supraclavicular block   Pre-Anesthetic Checklist: ,, timeout performed, Correct Patient, Correct Site, Correct Laterality, Correct Procedure,, site marked, risks and benefits discussed, Surgical consent,  Pre-op evaluation,  At surgeon's request and post-op pain management  Laterality: Right  Prep: chloraprep       Needles:  Injection technique: Single-shot  Needle Type: Echogenic Stimulator Needle     Needle Length: 9cm  Needle Gauge: 21     Additional Needles:   Procedures:,,,, ultrasound used (permanent image in chart),,,,  Narrative:  Start time: 12/12/2016 7:15 AM End time: 12/12/2016 7:25 AM Injection made incrementally with aspirations every 5 mL.  Performed by: Personally  Anesthesiologist: Murvin Natal, MD  Additional Notes: Functioning IV was confirmed and monitors were applied.  A 46mm 21ga Arrow echogenic stimulator needle was used. Sterile prep, hand hygiene and sterile gloves were used.  Negative aspiration and negative test dose prior to incremental administration of local anesthetic. The patient tolerated the procedure well.

## 2016-12-12 NOTE — Anesthesia Preprocedure Evaluation (Addendum)
Anesthesia Evaluation  Patient identified by MRN, date of birth, ID band Patient awake    Reviewed: Allergy & Precautions, NPO status , Patient's Chart, lab work & pertinent test results  Airway Mallampati: II  TM Distance: >3 FB Neck ROM: Full    Dental no notable dental hx.    Pulmonary asthma , former smoker,  Idiopathic pulmonary fibrosis  Oxygen dependent- on 2L  Sees pulmonologist    Pulmonary exam normal breath sounds clear to auscultation       Cardiovascular Normal cardiovascular exam+ dysrhythmias Atrial Fibrillation  Rhythm:Regular Rate:Normal  ECG: SR, occ PVC's. Rate 73  Sees cardiologist ([place])   Neuro/Psych PSYCHIATRIC DISORDERS negative neurological ROS     GI/Hepatic Neg liver ROS, GERD  Medicated,  Endo/Other  negative endocrine ROS  Renal/GU negative Renal ROS     Musculoskeletal Impingement Syndrome, Torn Rotator Cuff, AC Arthritis Right Shoulder   Abdominal   Peds  Hematology negative hematology ROS (+)   Anesthesia Other Findings Dyslipidemia  Reproductive/Obstetrics                            Anesthesia Physical  Anesthesia Plan  ASA: IV  Anesthesia Plan: General and Regional   Post-op Pain Management: GA combined w/ Regional for post-op pain   Induction: Intravenous  PONV Risk Score and Plan: 2 and Ondansetron and Dexamethasone  Airway Management Planned: Oral ETT  Additional Equipment:   Intra-op Plan:   Post-operative Plan: Possible Post-op intubation/ventilation and Extubation in OR  Informed Consent: I have reviewed the patients History and Physical, chart, labs and discussed the procedure including the risks, benefits and alternatives for the proposed anesthesia with the patient or authorized representative who has indicated his/her understanding and acceptance.   Dental advisory given  Plan Discussed with: CRNA  Anesthesia Plan  Comments:         Anesthesia Quick Evaluation

## 2016-12-12 NOTE — Anesthesia Postprocedure Evaluation (Signed)
Anesthesia Post Note  Patient: Curtis [name]  Procedure(s) Performed: RIGHT SHOULDER ARTHROSCOPY, SUBACROMIAL DECOMPRESSION, DISTAL CLAVICLE RESECTION, WITH POSSIBLE MINI OPEN ROTATOR CUFF REPAIR (Right )     Patient location during evaluation: PACU Anesthesia Type: Regional and General Level of consciousness: awake and alert Pain management: pain level controlled Vital Signs Assessment: post-procedure vital signs reviewed and stable Respiratory status: spontaneous breathing, nonlabored ventilation, respiratory function stable and patient connected to nasal cannula oxygen Cardiovascular status: blood pressure returned to baseline and stable Postop Assessment: no apparent nausea or vomiting Anesthetic complications: no    Last Vitals:  Vitals:   12/12/16 1015 12/12/16 1021  BP: 133/85 (!) 142/86  Pulse: 73 78  Resp: 18 17  Temp: 36.5 C   SpO2: 94% 98%    Last Pain:  Vitals:   12/12/16 1015  TempSrc:   PainSc: 3                  Shandy Vi P Artyom Stencel

## 2016-12-12 NOTE — Transfer of Care (Signed)
Immediate Anesthesia Transfer of Care Note  Patient: Curtis [name]  Procedure(s) Performed: RIGHT SHOULDER ARTHROSCOPY, SUBACROMIAL DECOMPRESSION, DISTAL CLAVICLE RESECTION, WITH POSSIBLE MINI OPEN ROTATOR CUFF REPAIR (Right )  Patient Location: PACU  Anesthesia Type:General  Level of Consciousness: awake, oriented, drowsy, patient cooperative and responds to stimulation  Airway & Oxygen Therapy: Patient Spontanous Breathing and Patient connected to face mask oxygen  Post-op Assessment: Report given to RN, Post -op Vital signs reviewed and stable and Patient moving all extremities  Post vital signs: Reviewed and stable  Last Vitals:  Vitals:   12/12/16 0551  BP: 129/72  Pulse: 71  Resp: 18  Temp: 36.7 C  SpO2: 99%    Last Pain:  Vitals:   12/12/16 0551  TempSrc: Oral      Patients Stated Pain Goal: 5 (16/10/96 0454)  Complications: No apparent anesthesia complications

## 2016-12-12 NOTE — Op Note (Signed)
PATIENT ID:      Curtis [name]  MRN:     [id number] DOB/AGE:    Jul 27, 1945 / 71 y.o.       OPERATIVE REPORT    DATE OF PROCEDURE:  12/12/2016       PREOPERATIVE DIAGNOSIS:   Impingement Syndrome, , AC Arthritis Right Shoulder, partial rotator cuff tear                                                       Estimated body mass index is 29.03 kg/m as calculated from the following:   Height as of this encounter: 6\' 1"  (1.854 m).   Weight as of this encounter: 220 lb (99.8 kg).     POSTOPERATIVE DIAGNOSIS:   same                                                                   Estimated body mass index is 29.03 kg/m as calculated from the following:   Height as of this encounter: 6\' 1"  (1.854 m).   Weight as of this encounter: 220 lb (99.8 kg).     PROCEDURE:  Procedure(s): RIGHT SHOULDER ARTHROSCOPY, SUBACROMIAL DECOMPRESSION, DISTAL CLAVICLE RESECTION, WITH DEBRIDEMENT OF PARTIAL ROTATOR CUFF TEAR     SURGEON:  [name], MD    ASSISTANT:   [name], PA-C   (Present and scrubbed throughout the case, critical for assistance with exposure, retraction, instrumentation, and closure.)          ANESTHESIA: regional and general     DRAINS: none :      TOURNIQUET TIME: * No tourniquets in log *    COMPLICATIONS:  None   CONDITION:  stable  PROCEDURE IN DETAIL: [place] 12/12/2016, 8:52 AM

## 2016-12-12 NOTE — H&P (Signed)
The recent History & Physical has been reviewed. I have personally examined the patient today. There is no interval change to the documented History & Physical. The patient would like to proceed with the procedure.  [name] 12/12/2016,  7:25 AM

## 2016-12-12 NOTE — Anesthesia Procedure Notes (Signed)
Procedure Name: Intubation Date/Time: 12/12/2016 7:56 AM Performed by: Murvin Natal, MD Pre-anesthesia Checklist: Patient identified, Emergency Drugs available, Suction available, Patient being monitored and Timeout performed Patient Re-evaluated:Patient Re-evaluated prior to induction Oxygen Delivery Method: Circle system utilized Preoxygenation: Pre-oxygenation with 100% oxygen Induction Type: IV induction Ventilation: Mask ventilation without difficulty and Oral airway inserted - appropriate to patient size Laryngoscope Size: Mac and 4 Grade View: Grade II Tube type: Oral Tube size: 7.5 mm Number of attempts: 1 Airway Equipment and Method: Stylet and Oral airway Placement Confirmation: ETT inserted through vocal cords under direct vision,  CO2 detector and breath sounds checked- equal and bilateral Secured at: 23 cm Tube secured with: Tape Dental Injury: Teeth and Oropharynx as per pre-operative assessment  Comments: Intubation by Chrissie Noa

## 2016-12-13 ENCOUNTER — Encounter (HOSPITAL_COMMUNITY): Payer: Self-pay | Admitting: Orthopaedic Surgery

## 2016-12-13 NOTE — Op Note (Signed)
NAME:  ATILANO, COVELLI              ACCOUNT NO.:  MEDICAL RECORD NO.:  [id number]  LOCATION:                                 FACILITY:  PHYSICIAN:  Vonna Kotyk. Durward Fortes, M.D.    DATE OF BIRTH:  DATE OF PROCEDURE:  12/12/2016 DATE OF DISCHARGE:                              OPERATIVE REPORT   PREOPERATIVE DIAGNOSIS:  Impingement syndrome, right shoulder, with degenerative arthritis, acromioclavicular joint and partial rotator cuff tear.  POSTOPERATIVE DIAGNOSES: 1. Impingement syndrome, right shoulder, with degenerative arthritis,     acromioclavicular joint and partial rotator cuff tear. 2. Adhesive capsulitis.  PROCEDURES: 1. Examination of right shoulder under anesthesia with manipulation of     adhesive capsulitis. 2. Diagnostic arthroscopy, right shoulder, with debridement of partial     rotator cuff tear. 3. Arthroscopic subacromial decompression. 4. Arthroscopic distal clavicle resection.  SURGEON:  Vonna Kotyk. Durward Fortes, MD.  ASSISTANT:  [name], PAC.  ANESTHESIA:  General with supplemental interscalene nerve block.  COMPLICATIONS:  None.  HISTORY:  A 71 year old male has had persistent problems with his right shoulder, particularly with overhead activity and sleeping.  He has had a course of exercises, subacromial cortisone injection, and medicines with persistent pain.  MRI scan revealed tendinosis of the supraspinatus tendon associated with AC joint arthritis.  Because of his poor response to nonoperative treatment, he is now to have an arthroscopic debridement and examination.  DESCRIPTION OF PROCEDURE:  Mr. Diskin was met with his wife in the holding area, identified his right shoulder as the appropriate operative site and marked it accordingly.  Anesthesia performed an interscalene nerve block.  The patient was then transported to room #4 and carefully placed under general anesthesia without difficulty.  He was then placed in the  semi- sitting position with a shoulder frame.  Time-out was called.  I had previously marked the right shoulder as appropriate operative site and then evaluated it under anesthesia as there was an obvious adhesive capsulitis.  This was manipulated so that I could place his arm fully over his head.  The right shoulder was then prepped with DuraPrep in the base of the neck circumferentially below the elbow.  Sterile draping was performed. Time-out was called again.  Marking pen was used to outline the [facility] joint, the coracoid and acromion. At a point of fingerbreadth posterior and medial to the posterior angle acromion, a small stab wound was made.  The arthroscope was easily placed into the shoulder joint.  There was a hemarthrosis from the manipulation.  This was evacuated.  There was a partial tear of the supraspinatus that was only very minimal.  There was no significant synovitis in the joint.  The biceps tendon had minimal tendinosis, but no tearing or thinning.  The subscapularis was intact.  There were no loose bodies.  I did not see any appreciable chondromalacia of the humeral head or the glenoid.  The biceps anchor appeared to be intact.  The arthroscope was then placed in subacromial space posteriorly and the subacromial space anteriorly and a third portal established in the lateral subacromial space.  Arthroscopic bursectomy was performed. There was a moderate amount of bursal tissue, some of which  was inflammatory.  There were obvious anterior and lateral overhang of the acromion.  An anterior and lateral partial acromionectomy was performed with a nice decompression.  There was some fraying in the bursal surface of the cuff, which was debrided.  There was overgrowth of the acromioclavicular joint with synovitis. Distal clavicle was resected about a centimeter with the same 6-mm bur with a nice flat resection.  The space was irrigated with saline solution.  The anterior  and lateral portals were closed with single 4-0 Ethilon. Sterile bulky dressing was applied followed by sling.  PLAN:  Re-evaluate in the recovery room for probable discharge, hydrocodone for pain.     Vonna Kotyk. Durward Fortes, M.D.     PWW/MEDQ  D:  12/12/2016  T:  12/13/2016  Job:  [id number]

## 2016-12-18 ENCOUNTER — Ambulatory Visit (INDEPENDENT_AMBULATORY_CARE_PROVIDER_SITE_OTHER): Payer: PPO | Admitting: Orthopaedic Surgery

## 2016-12-18 ENCOUNTER — Encounter (INDEPENDENT_AMBULATORY_CARE_PROVIDER_SITE_OTHER): Payer: Self-pay | Admitting: Orthopaedic Surgery

## 2016-12-18 VITALS — Resp 12 | Ht 72.0 in | Wt 210.0 lb

## 2016-12-18 DIAGNOSIS — M25511 Pain in right shoulder: Secondary | ICD-10-CM

## 2016-12-18 DIAGNOSIS — G8929 Other chronic pain: Secondary | ICD-10-CM

## 2016-12-18 NOTE — Progress Notes (Signed)
Office Visit Note   Patient: Curtis [name]           Date of Birth: [birth year]           MRN: [id number] Visit Date: 12/18/2016              Requested by: Kathyrn Drown, MD [address] [place], [place] [address] PCP: Kathyrn Drown, MD   Assessment & Plan: Visit Diagnoses: 6 days status post right shoulder arthroscopic debridement of partial rotator cuff tear, subacromial decompression and distal clavicle resection. Noted to have adhesive capsulitis at the time of surgery with manipulation  Plan: Doing well. Wean from sling. Start physical therapy at [facility]. Office 3 weeks. Instructed in range of motion exercises  Follow-Up Instructions: No Follow-up on file.   Orders:  No orders of the defined types were placed in this encounter.  No orders of the defined types were placed in this encounter.     Procedures: No procedures performed   Clinical Data: No additional findings.   Subjective: Chief Complaint  Patient presents with  . Right Shoulder - Routine Post Op    Mr. Deeb is a 71 y o S/P 6 days Right shoulder arthroplasty  With decompression.  Doing well without obvious complication. Right shoulder is stiff but feels like a lot of his pain has resolved.  HPI  Review of Systems  Constitutional: Negative for fatigue.  HENT: Negative for hearing loss.   Respiratory: Negative for apnea, chest tightness and shortness of breath.   Cardiovascular: Negative for chest pain, palpitations and leg swelling.  Gastrointestinal: Negative for blood in stool, constipation and diarrhea.  Genitourinary: Negative for difficulty urinating.  Musculoskeletal: Negative for arthralgias, back pain, joint swelling, myalgias, neck pain and neck stiffness.  Neurological: Positive for weakness. Negative for numbness and headaches.  Hematological: Does not bruise/bleed easily.  Psychiatric/Behavioral: Positive for sleep disturbance. The patient is not  nervous/anxious.      Objective: Vital Signs: Resp 12   Ht 6' (1.829 m)   Wt 210 lb (95.3 kg)   BMI 28.48 kg/m   Physical Exam  Ortho Exam right shoulder wounds healing without problem. Steri-Strips reapplied. Passive flexion and abduction to 90 area will need to work on range of motion and physical therapy. Neurovascular exam intact. No evidence of infection No specialty comments available.  Imaging: No results found.   PMFS History: Patient Active Problem List   Diagnosis Date Noted  . Impingement syndrome of right shoulder 12/12/2016  . AC (acromioclavicular) arthritis 12/12/2016  . Partial tear of right rotator cuff 12/12/2016  . Chronic respiratory failure ([place]) 04/26/2016  . Hyperbilirubinemia 04/26/2016  . H/O asbestos exposure 03/15/2016  . Persistent atrial fibrillation ([place]) 02/19/2016  . Atrial fibrillation, persistent ([place]) 02/19/2016  . IPF (idiopathic pulmonary fibrosis) ([place]) 02/09/2016  . Incidental lung nodule, greater than or equal to 71mm 02/09/2016  . Bell's palsy 08/03/2015  . Insomnia 06/08/2015  . Right trigeminal neuralgia 04/01/2014  . Chronic back pain 03/01/2014  . Elevated transaminase level 06/25/2013  . Obesity (BMI 30-39.9) 09/09/2012  . Chronic neck pain 09/09/2012  . Symptomatic cholelithiasis 04/14/2012  . Chest pain   . Statin intolerance   . Dyslipidemia   . GERD (gastroesophageal reflux disease)   . Hypertension   . INTERSTITIAL LUNG DISEASE 03/16/2010  . PSORIASIS 03/16/2010   Past Medical History:  Diagnosis Date  . Arthritis   . Asthma   . Atrial fibrillation ([place])   .  Cataract   . Cervical spondylosis   . Chest pain    Emergency room October 24, 2011, no MI  //   Nuclear, October, 2013, adenosine, EF 61%, no scar or ischemia  . Dyslipidemia    Statin intolerant  . Dyspnea   . GERD (gastroesophageal reflux disease)   . Glaucoma   . Hearing loss   . Hypertension   . Hypertension   . Idiopathic pulmonary fibrosis  ([place])   . Interstitial pulmonary fibrosis ([place]) 2012  . Left shoulder pain 2011  . Myocardial infarction ([place])   . Psoriasis   . Pulmonary fibrosis ([place])   . Statin intolerance     Family History  Problem Relation Age of Onset  . Hypertension Mother   . Congestive Heart Failure Mother   . COPD Mother   . Stroke Father   . Cancer Sister        breast    Past Surgical History:  Procedure Laterality Date  . BRAIN SURGERY    . CARDIOVERSION N/A 12/13/2015   Procedure: CARDIOVERSION;  Surgeon: Josue Hector, MD;  Location: [place];  Service: Cardiovascular;  Laterality: N/A;  . CATARACT EXTRACTION W/PHACO Right 06/11/2012   Procedure: CATARACT EXTRACTION PHACO AND INTRAOCULAR LENS PLACEMENT ([place]);  Surgeon: Tonny Branch, MD;  [facility];  Service: Ophthalmology;  Laterality: Right;  CDE:  14.43  . CATARACT EXTRACTION W/PHACO Left 06/29/2012   Procedure: CATARACT EXTRACTION PHACO AND INTRAOCULAR LENS PLACEMENT (I[facility]);  Surgeon: Tonny Branch, MD;  Location: [facility];  Service: Ophthalmology;  Laterality: Left;  CDE:14.11  . CHOLECYSTECTOMY  05/08/2012  . CHOLECYSTECTOMY N/A 05/08/2012   Procedure: LAPAROSCOPIC CHOLECYSTECTOMY;  Surgeon: Harl Bowie, MD;  Location: [place];  Service: General;  Laterality: N/A;  . COLONOSCOPY    . LIVER BIOPSY N/A 05/08/2012   Procedure: LIVER BIOPSY;  Surgeon: Harl Bowie, MD;  Location: [place];  Service: General;  Laterality: N/A;  . NECK SURGERY     cervical disc with cliip  . SHOULDER ARTHROSCOPY WITH DISTAL CLAVICLE RESECTION Right 12/12/2016   Procedure: RIGHT SHOULDER ARTHROSCOPY, SUBACROMIAL DECOMPRESSION, DISTAL CLAVICLE RESECTION, WITH POSSIBLE MINI OPEN ROTATOR CUFF REPAIR;  Surgeon: [name], MD;  Location: [place];  Service: Orthopedics;  Laterality: Right;   Social History   Occupational History  . Not on file  Tobacco Use  . Smoking status: Former Smoker    Packs/day: 0.50    Years: 52.00    Pack years: 26.00     Types: Cigarettes    Last attempt to quit: 09/08/2003    Years since quitting: 13.2  . Smokeless tobacco: Never Used  Substance and Sexual Activity  . Alcohol use: No    Comment: quit drinking 2004  . Drug use: No  . Sexual activity: Yes    Birth control/protection: None

## 2017-01-04 DIAGNOSIS — J8410 Pulmonary fibrosis, unspecified: Secondary | ICD-10-CM | POA: Diagnosis not present

## 2017-01-08 ENCOUNTER — Encounter (INDEPENDENT_AMBULATORY_CARE_PROVIDER_SITE_OTHER): Payer: Self-pay | Admitting: Orthopaedic Surgery

## 2017-01-08 ENCOUNTER — Ambulatory Visit (INDEPENDENT_AMBULATORY_CARE_PROVIDER_SITE_OTHER): Payer: PPO | Admitting: Orthopaedic Surgery

## 2017-01-08 DIAGNOSIS — G8929 Other chronic pain: Secondary | ICD-10-CM

## 2017-01-08 DIAGNOSIS — M25511 Pain in right shoulder: Secondary | ICD-10-CM

## 2017-01-08 NOTE — Progress Notes (Signed)
Office Visit Note   Patient: Curtis [name]           Date of Birth: Jun 07, 1945           MRN: [id number] Visit Date: 01/08/2017              Requested by: Kathyrn Drown, MD [address] [place], [place] [address] PCP: Kathyrn Drown, MD   Assessment & Plan: Visit Diagnoses:  1. Chronic right shoulder pain     Plan: One month status post arthroscopic subacromial decompression, distal clavicle resection and manipulation of the right shoulder. Doing well. She starts physical therapy in the morning of [facility]. Had evidence of adhesive capsulitis the time of surgery I think his course will be slow in terms of his overhead motion. We'll see back in 1 month  Follow-Up Instructions: Return in about 1 month (around 02/08/2017).   Orders:  No orders of the defined types were placed in this encounter.  No orders of the defined types were placed in this encounter.     Procedures: No procedures performed   Clinical Data: No additional findings.   Subjective: Chief Complaint  Patient presents with  . Right Shoulder - Routine Post Op  No complaints of the" a little stiff"  HPI  Review of Systems   Objective: Vital Signs: There were no vitals taken for this visit.  Physical Exam  Ortho Exam awake alert and oriented 3. Comfortable sitting. Arthroscopic incisions were right shoulder a few without problem. No ecchymosis. Neurovascular exam intact. Good grip and good release. Can touch the lower lumbar spine but not the thoracolumbar junction. Lacks about 40 of full overhead motion consistent with postop recurrent adhesive capsulitis. Hopefully the therapy will make a difference  Specialty Comments:  No specialty comments available.  Imaging: No results found.   PMFS History: Patient Active Problem List   Diagnosis Date Noted  . Impingement syndrome of right shoulder 12/12/2016  . AC (acromioclavicular) arthritis 12/12/2016  . Partial tear of right  rotator cuff 12/12/2016  . Chronic respiratory failure ([place]) 04/26/2016  . Hyperbilirubinemia 04/26/2016  . H/O asbestos exposure 03/15/2016  . Persistent atrial fibrillation ([place]) 02/19/2016  . Atrial fibrillation, persistent ([place]) 02/19/2016  . IPF (idiopathic pulmonary fibrosis) ([place]) 02/09/2016  . Incidental lung nodule, greater than or equal to 64mm 02/09/2016  . Bell's palsy 08/03/2015  . Insomnia 06/08/2015  . Right trigeminal neuralgia 04/01/2014  . Chronic back pain 03/01/2014  . Elevated transaminase level 06/25/2013  . Obesity (BMI 30-39.9) 09/09/2012  . Chronic neck pain 09/09/2012  . Symptomatic cholelithiasis 04/14/2012  . Chest pain   . Statin intolerance   . Dyslipidemia   . GERD (gastroesophageal reflux disease)   . Hypertension   . INTERSTITIAL LUNG DISEASE 03/16/2010  . PSORIASIS 03/16/2010   Past Medical History:  Diagnosis Date  . Arthritis   . Asthma   . Atrial fibrillation ([place])   . Cataract   . Cervical spondylosis   . Chest pain    Emergency room October 24, 2011, no MI  //   Nuclear, October, 2013, adenosine, EF 61%, no scar or ischemia  . Dyslipidemia    Statin intolerant  . Dyspnea   . GERD (gastroesophageal reflux disease)   . Glaucoma   . Hearing loss   . Hypertension   . Hypertension   . Idiopathic pulmonary fibrosis ([place])   . Interstitial pulmonary fibrosis ([place]) 2012  . Left shoulder pain 2011  .  Myocardial infarction ([place])   . Psoriasis   . Pulmonary fibrosis ([place])   . Statin intolerance     Family History  Problem Relation Age of Onset  . Hypertension Mother   . Congestive Heart Failure Mother   . COPD Mother   . Stroke Father   . Cancer Sister        breast    Past Surgical History:  Procedure Laterality Date  . BRAIN SURGERY    . CARDIOVERSION N/A 12/13/2015   Procedure: CARDIOVERSION;  Surgeon: Josue Hector, MD;  Location: [facility] [place];  Service: Cardiovascular;  Laterality: N/A;  . CATARACT EXTRACTION W/PHACO Right  06/11/2012   Procedure: CATARACT EXTRACTION PHACO AND INTRAOCULAR LENS PLACEMENT ([place]);  Surgeon: Tonny Branch, MD;  Location: [facility];  Service: Ophthalmology;  Laterality: Right;  CDE:  14.43  . CATARACT EXTRACTION W/PHACO Left 06/29/2012   Procedure: CATARACT EXTRACTION PHACO AND INTRAOCULAR LENS PLACEMENT ([facility]);  Surgeon: Tonny Branch, MD;  [facility];  Service: Ophthalmology;  Laterality: Left;  CDE:14.11  . CHOLECYSTECTOMY  05/08/2012  . CHOLECYSTECTOMY N/A 05/08/2012   Procedure: LAPAROSCOPIC CHOLECYSTECTOMY;  Surgeon: Harl Bowie, MD;  Location: [place];  Service: General;  Laterality: N/A;  . COLONOSCOPY    . LIVER BIOPSY N/A 05/08/2012   Procedure: LIVER BIOPSY;  Surgeon: Harl Bowie, MD;  Location: [facility] [place];  Service: General;  Laterality: N/A;  . NECK SURGERY     cervical disc with cliip  . SHOULDER ARTHROSCOPY WITH DISTAL CLAVICLE RESECTION Right 12/12/2016   Procedure: RIGHT SHOULDER ARTHROSCOPY, SUBACROMIAL DECOMPRESSION, DISTAL CLAVICLE RESECTION, WITH POSSIBLE MINI OPEN ROTATOR CUFF REPAIR;  Surgeon: [name], MD;  Location: [facility];  Service: Orthopedics;  Laterality: Right;   Social History   Occupational History  . Not on file  Tobacco Use  . Smoking status: Former Smoker    Packs/day: 0.50    Years: 52.00    Pack years: 26.00    Types: Cigarettes    Last attempt to quit: 09/08/2003    Years since quitting: 13.3  . Smokeless tobacco: Never Used  Substance and Sexual Activity  . Alcohol use: No    Comment: quit drinking 2004  . Drug use: No  . Sexual activity: Yes    Birth control/protection: None     [name], MD   Note - This record has been created using [employer].  Chart creation errors have been sought, but may not always  have been located. Such creation errors do not reflect on  the standard of medical care.

## 2017-01-09 ENCOUNTER — Ambulatory Visit (HOSPITAL_COMMUNITY): Payer: PPO | Attending: Orthopaedic Surgery

## 2017-01-09 ENCOUNTER — Encounter (HOSPITAL_COMMUNITY): Payer: Self-pay

## 2017-01-09 DIAGNOSIS — R29898 Other symptoms and signs involving the musculoskeletal system: Secondary | ICD-10-CM | POA: Insufficient documentation

## 2017-01-09 DIAGNOSIS — M25511 Pain in right shoulder: Secondary | ICD-10-CM | POA: Insufficient documentation

## 2017-01-09 DIAGNOSIS — M25611 Stiffness of right shoulder, not elsewhere classified: Secondary | ICD-10-CM | POA: Insufficient documentation

## 2017-01-09 NOTE — Therapy (Signed)
[place], [place], [address] Phone: [phone]   Fax:  [phone]  Occupational Therapy Evaluation  Patient Details  Name: Curtis [name] MRN: [id number] Date of Birth: [birth year] No Data Recorded  Encounter Date: 01/09/2017  OT End of Session - 01/09/17 0918    Visit Number  1    Number of Visits  12    Date for OT Re-Evaluation  02/20/17 mini reassess: 02/06/17    Authorization Type  [employer] $15 co-pay No visit limit    OT Start Time  0815    OT Stop Time  0850    OT Time Calculation (min)  35 min    Activity Tolerance  Patient tolerated treatment well    Behavior During Therapy  [facility] for tasks assessed/performed       Past Medical History:  Diagnosis Date  . Arthritis   . Asthma   . Atrial fibrillation ([place])   . Cataract   . Cervical spondylosis   . Chest pain    Emergency room October 24, 2011, no MI  //   Nuclear, October, 2013, adenosine, EF 61%, no scar or ischemia  . Dyslipidemia    Statin intolerant  . Dyspnea   . GERD (gastroesophageal reflux disease)   . Glaucoma   . Hearing loss   . Hypertension   . Hypertension   . Idiopathic pulmonary fibrosis ([place])   . Interstitial pulmonary fibrosis ([place]) 2012  . Left shoulder pain 2011  . Myocardial infarction ([place])   . Psoriasis   . Pulmonary fibrosis ([place])   . Statin intolerance     Past Surgical History:  Procedure Laterality Date  . BRAIN SURGERY    . CARDIOVERSION N/A 12/13/2015   Procedure: CARDIOVERSION;  Surgeon: Josue Hector, MD;  Location: [place];  Service: Cardiovascular;  Laterality: N/A;  . CATARACT EXTRACTION W/PHACO Right 06/11/2012   Procedure: CATARACT EXTRACTION PHACO AND INTRAOCULAR LENS PLACEMENT ([place]);  Surgeon: Tonny Branch, MD;  [facility];  Service: Ophthalmology;  Laterality: Right;  CDE:  14.43  . CATARACT EXTRACTION W/PHACO Left 06/29/2012   Procedure: CATARACT EXTRACTION PHACO AND  INTRAOCULAR LENS PLACEMENT (I[facility]);  Surgeon: Tonny Branch, MD;  [facility];  Service: Ophthalmology;  Laterality: Left;  CDE:14.11  . CHOLECYSTECTOMY  05/08/2012  . CHOLECYSTECTOMY N/A 05/08/2012   Procedure: LAPAROSCOPIC CHOLECYSTECTOMY;  Surgeon: Harl Bowie, MD;  Location: [place];  Service: General;  Laterality: N/A;  . COLONOSCOPY    . LIVER BIOPSY N/A 05/08/2012   Procedure: LIVER BIOPSY;  Surgeon: Harl Bowie, MD;  Location: [place];  Service: General;  Laterality: N/A;  . NECK SURGERY     cervical disc with cliip  . SHOULDER ARTHROSCOPY WITH DISTAL CLAVICLE RESECTION Right 12/12/2016   Procedure: RIGHT SHOULDER ARTHROSCOPY, SUBACROMIAL DECOMPRESSION, DISTAL CLAVICLE RESECTION, WITH POSSIBLE MINI OPEN ROTATOR CUFF REPAIR;  Surgeon: [name], MD;  Location: [place];  Service: Orthopedics;  Laterality: Right;    There were no vitals filed for this visit.  Subjective Assessment - 01/09/17 0817    Subjective   S: I'm motivated to work on it.     Pertinent History  Patient is a 72 y/o male S/P right arthroscopic subacromial decompression, distal clavicle resection and manipulation of the shoulder. Surgery was completed on 12/12/16. Dr. Durward Fortes has referred patient to occupational therapy for evaluation and treatment.     Special Tests  FOTO: 60/100    Patient Stated  Goals  To be able to move my right arm normal.    Currently in Pain?  Yes    Pain Score  5     Pain Location  Shoulder    Pain Orientation  Right    Pain Descriptors / Indicators  Sore    Pain Type  Acute pain    Pain Onset  1 to 4 weeks ago    Pain Frequency  Occasional    Aggravating Factors   Reaching overhead or behind back    Pain Relieving Factors  pain medication, heat pad    Effect of Pain on Daily Activities  mod effect    Multiple Pain Sites  No        [facility] OT Assessment - 01/09/17 0803      Assessment   Medical Diagnosis  S/P right shoulder arthroscopic debridement    Referring Provider   [name] MD    Onset Date/Surgical Date  12/12/16    Hand Dominance  Right    Prior Therapy  None      Precautions   Precautions  Shoulder    Type of Shoulder Precautions  No heavy lifting.       Restrictions   Weight Bearing Restrictions  Yes    RUE Weight Bearing  Non weight bearing      Balance Screen   Has the patient fallen in the past 6 months  No      Home  Environment   Family/patient expects to be discharged to:  Private residence    Living Arrangements  Spouse/significant other son      Prior Function   Level of Independence  Independent    Vocation  Retired    Leisure  Pt enjoys spending time with his two dogs      ADL   ADL comments  Patient has difficulty reaching behind his back, above shoulder height. He is unable to complete any heavy lifting activities. Increased pain with repetitive use.       Mobility   Mobility Status  Independent      Written Expression   Dominant Hand  Right Pt reports that he uses right and left equally.      Vision - History   Baseline Vision  No visual deficits      Cognition   Overall Cognitive Status  Within Functional Limits for tasks assessed      Observation/Other Assessments   Focus on Therapeutic Outcomes (FOTO)   60/100      ROM / Strength   AROM / PROM / Strength  Strength;PROM;AROM      Palpation   Palpation comment  Max fascial restrictions in right upper arm, anterior deltoid, and upper trapezius.       AROM   Overall AROM Comments  Assessed seated. IR/er adducted.    AROM Assessment Site  Shoulder    Right/Left Shoulder  Right    Right Shoulder Flexion  115 Degrees    Right Shoulder ABduction  92 Degrees    Right Shoulder Internal Rotation  90 Degrees    Right Shoulder External Rotation  50 Degrees      PROM   Overall PROM Comments  Assessed seated. IR/er adducted.    PROM Assessment Site  Shoulder    Right/Left Shoulder  Right    Right Shoulder Flexion  105 Degrees    Right Shoulder  ABduction  105 Degrees    Right Shoulder Internal Rotation  90 Degrees  Right Shoulder External Rotation  70 Degrees      Strength   Overall Strength Comments  Assessed standing. IR/er adducted    Strength Assessment Site  Shoulder    Right/Left Shoulder  Right    Right Shoulder Flexion  3-/5    Right Shoulder ABduction  3-/5    Right Shoulder Internal Rotation  3/5    Right Shoulder External Rotation  3/5                      OT Education - 01/09/17 0917    Education provided  Yes    Education Details  AA/ROM shoulder exercises    Person(s) Educated  Patient    Methods  Explanation;Demonstration;Verbal cues;Handout    Comprehension  Verbalized understanding       OT Short Term Goals - 01/09/17 0930      OT SHORT TERM GOAL #1   Title  Patient will be educated and independent with HEP to facilitate progress in therapy which will increase his use of his RUE when completing daily tasks.     Time  3    Period  Weeks    Status  New    Target Date  01/30/17      OT SHORT TERM GOAL #2   Title  Patient will increase P/ROM of RUE to WNL to increase ability to reach behind head with less difficulty.     Time  3    Period  Weeks    Status  New      OT SHORT TERM GOAL #3   Title  Patient will increase RUE strength to 3+/5 to increase ability to complete lift weight lifting tasks at home.    Time  3    Period  Weeks    Status  New      OT SHORT TERM GOAL #4   Title  Patient will decrease fascial restrictions in right arm to mod amount to increase functional mobility needed for reaching tasks.     Time  4    Period  Weeks    Status  New      OT SHORT TERM GOAL #5   Title  Patient will decrease pain level in RUE to 4/10 or less during daily tasks.     Time  4    Period  Weeks    Status  New        OT Long Term Goals - 01/09/17 0932      OT LONG TERM GOAL #1   Title  Patient will return to his highest level of independence while using his RUE as his  dominant extremity for 100% of daily tasks.     Time  6    Period  Weeks    Status  New    Target Date  02/20/17      OT LONG TERM GOAL #2   Title  Patient increase A/ROM of RUE to WNL to increase ability to reach behind back with increased comfort.     Time  6    Period  Weeks    Status  New      OT LONG TERM GOAL #3   Title  Patient will increase RUE strength to 4/5 to increase ability to return to normal household lifting tasks.     Time  6    Period  Weeks    Status  New      OT LONG TERM GOAL #4  Title  Patient will decrease pain level in RUE to 2/10 or when completing daily tasks.     Time  6    Period  Weeks    Status  New      OT LONG TERM GOAL #5   Title  Patient will decrease fascial restrictions in RUE to min amount or less to increase functional mobility needed to complete overhead reaching tasks.     Time  6    Period  Weeks    Status  New            Plan - 01/09/17 3220    Clinical Impression Statement  A: patient is a 71 y/o male S/P right shoulder surgery causing increased pain, fascial restrictions, and decreased strength and ROM resulting in difficulty completing daily tasks with RUE.     Occupational Profile and client history currently impacting functional performance  Motivated to return to prior level of functiona, strong social support at home.     Occupational performance deficits (Please refer to evaluation for details):  ADL's;Leisure;IADL's    Rehab Potential  Excellent    OT Frequency  2x / week    OT Duration  6 weeks    OT Treatment/Interventions  Self-care/ADL training;Ultrasound;DME and/or AE instruction;Iontophoresis;Patient/family education;Passive range of motion;Cryotherapy;Electrical Stimulation;Moist Heat;Therapeutic activities;Manual Therapy;Therapeutic exercise    Plan  P: Patient will benefit from skilled OT services to increase functional performance during daily tasks when using RUE. Treatment Plan: myofascial release, manual  stretching, AA/ROM, A/ROM, progress to general shoulder and scapular strengthening. Modalities PRN.    Clinical Decision Making  Limited treatment options, no task modification necessary    OT Home Exercise Plan  1/3: AA/ROM exercises    Consulted and Agree with Plan of Care  Patient       Patient will benefit from skilled therapeutic intervention in order to improve the following deficits and impairments:  Decreased strength, Decreased range of motion, Pain, Impaired UE functional use, Increased fascial restrictions  Visit Diagnosis: Acute pain of right shoulder - Plan: Ot plan of care cert/re-cert  Stiffness of right shoulder, not elsewhere classified - Plan: Ot plan of care cert/re-cert  Other symptoms and signs involving the musculoskeletal system - Plan: Ot plan of care cert/re-cert    Problem List Patient Active Problem List   Diagnosis Date Noted  . Impingement syndrome of right shoulder 12/12/2016  . AC (acromioclavicular) arthritis 12/12/2016  . Partial tear of right rotator cuff 12/12/2016  . Chronic respiratory failure ([place]) 04/26/2016  . Hyperbilirubinemia 04/26/2016  . H/O asbestos exposure 03/15/2016  . Persistent atrial fibrillation ([place]) 02/19/2016  . Atrial fibrillation, persistent ([place]) 02/19/2016  . IPF (idiopathic pulmonary fibrosis) ([place]) 02/09/2016  . Incidental lung nodule, greater than or equal to 76mm 02/09/2016  . Bell's palsy 08/03/2015  . Insomnia 06/08/2015  . Right trigeminal neuralgia 04/01/2014  . Chronic back pain 03/01/2014  . Elevated transaminase level 06/25/2013  . Obesity (BMI 30-39.9) 09/09/2012  . Chronic neck pain 09/09/2012  . Symptomatic cholelithiasis 04/14/2012  . Chest pain   . Statin intolerance   . Dyslipidemia   . GERD (gastroesophageal reflux disease)   . Hypertension   . INTERSTITIAL LUNG DISEASE 03/16/2010  . PSORIASIS 03/16/2010   Ailene Ravel, OTR/L,CBIS  [phone]  01/09/2017, 9:39 AM  [place], [place], [address] Phone: [phone]   Fax:  [phone]  Name: Curtis [name] MRN: [id number] Date of Birth: 1945-10-06

## 2017-01-09 NOTE — Patient Instructions (Signed)

## 2017-01-14 ENCOUNTER — Encounter (HOSPITAL_COMMUNITY): Payer: Self-pay

## 2017-01-14 ENCOUNTER — Ambulatory Visit (HOSPITAL_COMMUNITY): Payer: PPO

## 2017-01-14 DIAGNOSIS — M25611 Stiffness of right shoulder, not elsewhere classified: Secondary | ICD-10-CM

## 2017-01-14 DIAGNOSIS — R29898 Other symptoms and signs involving the musculoskeletal system: Secondary | ICD-10-CM

## 2017-01-14 DIAGNOSIS — M25511 Pain in right shoulder: Secondary | ICD-10-CM

## 2017-01-14 NOTE — Therapy (Signed)
[name], [place], [address] Phone: [phone]   Fax:  [phone]  Occupational Therapy Treatment  Patient Details  Name: Curtis [name] MRN: [id number] Date of Birth: Feb 19, 1945 No Data Recorded  Encounter Date: 01/14/2017  OT End of Session - 01/14/17 0900    Visit Number  2    Number of Visits  12    Date for OT Re-Evaluation  02/20/17 mini reassess: 02/06/17    Authorization Type  [employer] $15 co-pay No visit limit    OT Start Time  0816    OT Stop Time  0900    OT Time Calculation (min)  44 min    Activity Tolerance  Patient tolerated treatment well    Behavior During Therapy  [facility] for tasks assessed/performed       Past Medical History:  Diagnosis Date  . Arthritis   . Asthma   . Atrial fibrillation ([place])   . Cataract   . Cervical spondylosis   . Chest pain    Emergency room October 24, 2011, no MI  //   Nuclear, October, 2013, adenosine, EF 61%, no scar or ischemia  . Dyslipidemia    Statin intolerant  . Dyspnea   . GERD (gastroesophageal reflux disease)   . Glaucoma   . Hearing loss   . Hypertension   . Hypertension   . Idiopathic pulmonary fibrosis ([place])   . Interstitial pulmonary fibrosis ([place]) 2012  . Left shoulder pain 2011  . Myocardial infarction ([place])   . Psoriasis   . Pulmonary fibrosis ([place])   . Statin intolerance     Past Surgical History:  Procedure Laterality Date  . BRAIN SURGERY    . CARDIOVERSION N/A 12/13/2015   Procedure: CARDIOVERSION;  Surgeon: Josue Hector, MD;  Location: [place];  Service: Cardiovascular;  Laterality: N/A;  . CATARACT EXTRACTION W/PHACO Right 06/11/2012   Procedure: CATARACT EXTRACTION PHACO AND INTRAOCULAR LENS PLACEMENT ([place]);  Surgeon: Tonny Branch, MD;  [facility];  Service: Ophthalmology;  Laterality: Right;  CDE:  14.43  . CATARACT EXTRACTION W/PHACO Left 06/29/2012   Procedure: CATARACT EXTRACTION PHACO AND  INTRAOCULAR LENS PLACEMENT (IOC);  Surgeon: Tonny Branch, MD;  Location: [facility];  Service: Ophthalmology;  Laterality: Left;  CDE:14.11  . CHOLECYSTECTOMY  05/08/2012  . CHOLECYSTECTOMY N/A 05/08/2012   Procedure: LAPAROSCOPIC CHOLECYSTECTOMY;  Surgeon: Harl Bowie, MD;  Location: [place];  Service: General;  Laterality: N/A;  . COLONOSCOPY    . LIVER BIOPSY N/A 05/08/2012   Procedure: LIVER BIOPSY;  Surgeon: Harl Bowie, MD;  Location: [place];  Service: General;  Laterality: N/A;  . NECK SURGERY     cervical disc with cliip  . SHOULDER ARTHROSCOPY WITH DISTAL CLAVICLE RESECTION Right 12/12/2016   Procedure: RIGHT SHOULDER ARTHROSCOPY, SUBACROMIAL DECOMPRESSION, DISTAL CLAVICLE RESECTION, WITH POSSIBLE MINI OPEN ROTATOR CUFF REPAIR;  Surgeon: [name], MD;  Location: [place];  Service: Orthopedics;  Laterality: Right;    There were no vitals filed for this visit.  Subjective Assessment - 01/14/17 0838    Subjective   S: My wife has been massaging my arm before the exercises and it has really helped.    Currently in Pain?  No/denies                   OT Treatments/Exercises (OP) - 01/14/17 0841      Exercises   Exercises  Shoulder  Shoulder Exercises: Supine   Protraction  PROM;AAROM;10 reps    Horizontal ABduction  PROM;AAROM;10 reps    External Rotation  PROM;AAROM;10 reps    Internal Rotation  PROM;AAROM;10 reps    Flexion  PROM;AAROM;10 reps    ABduction  PROM;AAROM;10 reps      Shoulder Exercises: Seated   Protraction  AAROM;10 reps    External Rotation  AAROM;10 reps    Internal Rotation  AAROM;10 reps    Flexion  AAROM;10 reps    Abduction  AAROM;10 reps      Shoulder Exercises: Pulleys   Flexion  1 minute    ABduction  1 minute      Shoulder Exercises: ROM/Strengthening   [place]  1'      Manual Therapy   Manual Therapy  Myofascial release;Muscle Energy Technique    Manual therapy comments  Manual therapy completed prior to  exercises.    Myofascial Release  Myofascial release and manual stretching completed to right upper arm, trapezius, and scapular region.    Muscle Energy Technique  Muscle energy technique completed to right anterior deltoid to relax tone and muscle spasm and improve range of motion.               OT Education - 01/14/17 [phone]    Education provided  Yes    Education Details  Pt given print out of OT evaluation. Reviewed goals and plan of care.     Person(s) Educated  Patient    Methods  Explanation;Handout    Comprehension  Verbalized understanding       OT Short Term Goals - 01/14/17 0848      OT SHORT TERM GOAL #1   Title  Patient will be educated and independent with HEP to facilitate progress in therapy which will increase his use of his RUE when completing daily tasks.     Time  3    Period  Weeks    Status  On-going      OT SHORT TERM GOAL #2   Title  Patient will increase P/ROM of RUE to WNL to increase ability to reach behind head with less difficulty.     Time  3    Period  Weeks    Status  On-going      OT SHORT TERM GOAL #3   Title  Patient will increase RUE strength to 3+/5 to increase ability to complete lift weight lifting tasks at home.    Time  3    Period  Weeks    Status  On-going      OT SHORT TERM GOAL #4   Title  Patient will decrease fascial restrictions in right arm to mod amount to increase functional mobility needed for reaching tasks.     Time  3    Period  Weeks    Status  On-going      OT SHORT TERM GOAL #5   Title  Patient will decrease pain level in RUE to 4/10 or less during daily tasks.     Time  3    Period  Weeks    Status  On-going        OT Long Term Goals - 01/14/17 0848      OT LONG TERM GOAL #1   Title  Patient will return to his highest level of independence while using his RUE as his dominant extremity for 100% of daily tasks.     Time  6    Period  Weeks    Status  On-going      OT LONG TERM GOAL #2   Title   Patient increase A/ROM of RUE to WNL to increase ability to reach behind back with increased comfort.     Time  6    Period  Weeks    Status  On-going      OT LONG TERM GOAL #3   Title  Patient will increase RUE strength to 4/5 to increase ability to return to normal household lifting tasks.     Time  6    Period  Weeks    Status  On-going      OT LONG TERM GOAL #4   Title  Patient will decrease pain level in RUE to 2/10 or when completing daily tasks.     Time  6    Period  Weeks    Status  On-going      OT LONG TERM GOAL #5   Title  Patient will decrease fascial restrictions in RUE to min amount or less to increase functional mobility needed to complete overhead reaching tasks.     Time  6    Period  Weeks    Status  On-going            Plan - 01/14/17 0900    Clinical Impression Statement  A: Initiated myofascial release, manual stretching, AA/ROM exercises and pulleys. patient did present with more ROM during passive and AA/ROM. VC for form and technique needed.     Plan  P: Add proximal shoulder strengthening.     Consulted and Agree with Plan of Care  Patient       Patient will benefit from skilled therapeutic intervention in order to improve the following deficits and impairments:  Decreased strength, Decreased range of motion, Pain, Impaired UE functional use, Increased fascial restrictions  Visit Diagnosis: Acute pain of right shoulder  Stiffness of right shoulder, not elsewhere classified  Other symptoms and signs involving the musculoskeletal system    Problem List Patient Active Problem List   Diagnosis Date Noted  . Impingement syndrome of right shoulder 12/12/2016  . AC (acromioclavicular) arthritis 12/12/2016  . Partial tear of right rotator cuff 12/12/2016  . Chronic respiratory failure ([place]) 04/26/2016  . Hyperbilirubinemia 04/26/2016  . H/O asbestos exposure 03/15/2016  . Persistent atrial fibrillation ([place]) 02/19/2016  . Atrial  fibrillation, persistent ([place]) 02/19/2016  . IPF (idiopathic pulmonary fibrosis) ([place]) 02/09/2016  . Incidental lung nodule, greater than or equal to 19mm 02/09/2016  . Bell's palsy 08/03/2015  . Insomnia 06/08/2015  . Right trigeminal neuralgia 04/01/2014  . Chronic back pain 03/01/2014  . Elevated transaminase level 06/25/2013  . Obesity (BMI 30-39.9) 09/09/2012  . Chronic neck pain 09/09/2012  . Symptomatic cholelithiasis 04/14/2012  . Chest pain   . Statin intolerance   . Dyslipidemia   . GERD (gastroesophageal reflux disease)   . Hypertension   . INTERSTITIAL LUNG DISEASE 03/16/2010  . PSORIASIS 03/16/2010   Ailene Ravel, OTR/L,CBIS  [phone]  01/14/2017, 9:02 AM  [place], [place], [address] Phone: [phone]   Fax:  [phone]  Name: Tabb Croghan MRN: [id number] Date of Birth: Jul 13, 1945

## 2017-01-15 ENCOUNTER — Telehealth (HOSPITAL_COMMUNITY): Payer: Self-pay | Admitting: *Deleted

## 2017-01-15 DIAGNOSIS — I4819 Other persistent atrial fibrillation: Secondary | ICD-10-CM

## 2017-01-15 MED ORDER — DILTIAZEM HCL ER COATED BEADS 120 MG PO CP24: ORAL_CAPSULE | ORAL | 7 refills | Status: DC

## 2017-01-15 MED ORDER — APIXABAN 5 MG PO TABS: 5.0000 mg | ORAL_TABLET | Freq: Two times a day (BID) | ORAL | 6 refills | Status: DC

## 2017-01-15 NOTE — Telephone Encounter (Signed)
Pt last saw Dr Curt Bears 09/03/16, last labs 12/12/16 Creat 0.87, age 72, weight 95.3kg, based on specified criteria pt is on appropriate dosage of Eliquis 5mg  BID, will refill rx.

## 2017-01-15 NOTE — Telephone Encounter (Signed)
Patient called requesting refills of Eliquis 5mg  BID and Cardizem CD 120mg  once a day. Both to be called into [facility] in [place]. Pt follows with Dr. Curt Bears.

## 2017-01-15 NOTE — Telephone Encounter (Signed)
Pt requesting a refill on Eliquis. Please address °

## 2017-01-17 ENCOUNTER — Ambulatory Visit (HOSPITAL_COMMUNITY): Payer: PPO

## 2017-01-17 ENCOUNTER — Encounter (HOSPITAL_COMMUNITY): Payer: Self-pay

## 2017-01-17 DIAGNOSIS — M25611 Stiffness of right shoulder, not elsewhere classified: Secondary | ICD-10-CM

## 2017-01-17 DIAGNOSIS — R29898 Other symptoms and signs involving the musculoskeletal system: Secondary | ICD-10-CM

## 2017-01-17 DIAGNOSIS — M25511 Pain in right shoulder: Secondary | ICD-10-CM | POA: Diagnosis not present

## 2017-01-17 NOTE — Therapy (Signed)
[place], [place], [address] Phone: [phone]   Fax:  [phone]  Occupational Therapy Treatment  Patient Details  Name: Curtis [name] MRN: [id number] Date of Birth: 1945-07-02 Referring Provider: [name] MD   Encounter Date: 01/17/2017  OT End of Session - 01/17/17 0849    Visit Number  3    Number of Visits  12    Date for OT Re-Evaluation  02/20/17 mini reassess: 02/06/17    Authorization Type  [employer] $15 co-pay No visit limit    OT Start Time  0820    OT Stop Time  0900    OT Time Calculation (min)  40 min    Activity Tolerance  Patient tolerated treatment well    Behavior During Therapy  [facility] for tasks assessed/performed       Past Medical History:  Diagnosis Date  . Arthritis   . Asthma   . Atrial fibrillation ([place])   . Cataract   . Cervical spondylosis   . Chest pain    Emergency room October 24, 2011, no MI  //   Nuclear, October, 2013, adenosine, EF 61%, no scar or ischemia  . Dyslipidemia    Statin intolerant  . Dyspnea   . GERD (gastroesophageal reflux disease)   . Glaucoma   . Hearing loss   . Hypertension   . Hypertension   . Idiopathic pulmonary fibrosis ([place])   . Interstitial pulmonary fibrosis ([place]) 2012  . Left shoulder pain 2011  . Myocardial infarction ([place])   . Psoriasis   . Pulmonary fibrosis ([place])   . Statin intolerance     Past Surgical History:  Procedure Laterality Date  . BRAIN SURGERY    . CARDIOVERSION N/A 12/13/2015   Procedure: CARDIOVERSION;  Surgeon: Josue Hector, MD;  Location: [place];  Service: Cardiovascular;  Laterality: N/A;  . CATARACT EXTRACTION W/PHACO Right 06/11/2012   Procedure: CATARACT EXTRACTION PHACO AND INTRAOCULAR LENS PLACEMENT ([place]);  Surgeon: Tonny Branch, MD;  [facility];  Service: Ophthalmology;  Laterality: Right;  CDE:  14.43  . CATARACT EXTRACTION W/PHACO Left 06/29/2012   Procedure: CATARACT  EXTRACTION PHACO AND INTRAOCULAR LENS PLACEMENT (IOC);  Surgeon: Tonny Branch, MD;  Location: [facility];  Service: Ophthalmology;  Laterality: Left;  CDE:14.11  . CHOLECYSTECTOMY  05/08/2012  . CHOLECYSTECTOMY N/A 05/08/2012   Procedure: LAPAROSCOPIC CHOLECYSTECTOMY;  Surgeon: Harl Bowie, MD;  Location: [place];  Service: General;  Laterality: N/A;  . COLONOSCOPY    . LIVER BIOPSY N/A 05/08/2012   Procedure: LIVER BIOPSY;  Surgeon: Harl Bowie, MD;  Location: [place];  Service: General;  Laterality: N/A;  . NECK SURGERY     cervical disc with cliip  . SHOULDER ARTHROSCOPY WITH DISTAL CLAVICLE RESECTION Right 12/12/2016   Procedure: RIGHT SHOULDER ARTHROSCOPY, SUBACROMIAL DECOMPRESSION, DISTAL CLAVICLE RESECTION, WITH POSSIBLE MINI OPEN ROTATOR CUFF REPAIR;  Surgeon: [name], MD;  Location: [place];  Service: Orthopedics;  Laterality: Right;    There were no vitals filed for this visit.  Subjective Assessment - 01/17/17 0846    Subjective   S: I haven't been good about doing my exercises.     Currently in Pain?  No/denies         [facility] OT Assessment - 01/17/17 0847      Assessment   Medical Diagnosis  S/P right shoulder arthroscopic debridement      Precautions   Precautions  Shoulder  Type of Shoulder Precautions  No heavy lifting.                OT Treatments/Exercises (OP) - 01/17/17 0847      Exercises   Exercises  Shoulder      Shoulder Exercises: Supine   Protraction  PROM;AAROM;10 reps    Horizontal ABduction  PROM;AAROM;10 reps    External Rotation  PROM;AAROM;10 reps    Internal Rotation  PROM;AAROM;10 reps    Flexion  PROM;AAROM;10 reps    ABduction  PROM;AAROM;10 reps      Shoulder Exercises: Seated   Protraction  AAROM;10 reps    Horizontal ABduction  AAROM;10 reps    External Rotation  AAROM;10 reps    Internal Rotation  AAROM;10 reps    Flexion  AAROM;10 reps    Abduction  AAROM;10 reps      Shoulder Exercises: Pulleys   Flexion   1 minute    ABduction  1 minute      Shoulder Exercises: ROM/Strengthening   Proximal Shoulder Strengthening, Supine  10X no rest breaks      Manual Therapy   Manual Therapy  Myofascial release;Muscle Energy Technique    Manual therapy comments  Manual therapy completed prior to exercises.    Myofascial Release  Myofascial release and manual stretching completed to right upper arm, trapezius, and scapular region.               OT Short Term Goals - 01/14/17 0848      OT SHORT TERM GOAL #1   Title  Patient will be educated and independent with HEP to facilitate progress in therapy which will increase his use of his RUE when completing daily tasks.     Time  3    Period  Weeks    Status  On-going      OT SHORT TERM GOAL #2   Title  Patient will increase P/ROM of RUE to WNL to increase ability to reach behind head with less difficulty.     Time  3    Period  Weeks    Status  On-going      OT SHORT TERM GOAL #3   Title  Patient will increase RUE strength to 3+/5 to increase ability to complete lift weight lifting tasks at home.    Time  3    Period  Weeks    Status  On-going      OT SHORT TERM GOAL #4   Title  Patient will decrease fascial restrictions in right arm to mod amount to increase functional mobility needed for reaching tasks.     Time  3    Period  Weeks    Status  On-going      OT SHORT TERM GOAL #5   Title  Patient will decrease pain level in RUE to 4/10 or less during daily tasks.     Time  3    Period  Weeks    Status  On-going        OT Long Term Goals - 01/14/17 0848      OT LONG TERM GOAL #1   Title  Patient will return to his highest level of independence while using his RUE as his dominant extremity for 100% of daily tasks.     Time  6    Period  Weeks    Status  On-going      OT LONG TERM GOAL #2   Title  Patient increase A/ROM of  RUE to WNL to increase ability to reach behind back with increased comfort.     Time  6    Period  Weeks     Status  On-going      OT LONG TERM GOAL #3   Title  Patient will increase RUE strength to 4/5 to increase ability to return to normal household lifting tasks.     Time  6    Period  Weeks    Status  On-going      OT LONG TERM GOAL #4   Title  Patient will decrease pain level in RUE to 2/10 or when completing daily tasks.     Time  6    Period  Weeks    Status  On-going      OT LONG TERM GOAL #5   Title  Patient will decrease fascial restrictions in RUE to min amount or less to increase functional mobility needed to complete overhead reaching tasks.     Time  6    Period  Weeks    Status  On-going            Plan - 01/17/17 0909    Clinical Impression Statement  A: Added proximal shoulder strengthening supine and seated. patient reports some pain and discomfort during exercises. VC for form and technique.     Plan  P: Continue with AA/ROM and increase repetitions to 12 if able to tolerate. Add therapy ball circles.    Consulted and Agree with Plan of Care  Patient       Patient will benefit from skilled therapeutic intervention in order to improve the following deficits and impairments:  Decreased strength, Decreased range of motion, Pain, Impaired UE functional use, Increased fascial restrictions  Visit Diagnosis: Acute pain of right shoulder  Stiffness of right shoulder, not elsewhere classified  Other symptoms and signs involving the musculoskeletal system    Problem List Patient Active Problem List   Diagnosis Date Noted  . Impingement syndrome of right shoulder 12/12/2016  . AC (acromioclavicular) arthritis 12/12/2016  . Partial tear of right rotator cuff 12/12/2016  . Chronic respiratory failure ([place]) 04/26/2016  . Hyperbilirubinemia 04/26/2016  . H/O asbestos exposure 03/15/2016  . Persistent atrial fibrillation ([place]) 02/19/2016  . Atrial fibrillation, persistent ([place]) 02/19/2016  . IPF (idiopathic pulmonary fibrosis) ([place]) 02/09/2016  . Incidental  lung nodule, greater than or equal to 49mm 02/09/2016  . Bell's palsy 08/03/2015  . Insomnia 06/08/2015  . Right trigeminal neuralgia 04/01/2014  . Chronic back pain 03/01/2014  . Elevated transaminase level 06/25/2013  . Obesity (BMI 30-39.9) 09/09/2012  . Chronic neck pain 09/09/2012  . Symptomatic cholelithiasis 04/14/2012  . Chest pain   . Statin intolerance   . Dyslipidemia   . GERD (gastroesophageal reflux disease)   . Hypertension   . INTERSTITIAL LUNG DISEASE 03/16/2010  . PSORIASIS 03/16/2010   Ailene Ravel, OTR/L,CBIS  [phone]  01/17/2017, 9:12 AM  [place], [place], [address] Phone: [phone]   Fax:  [phone]  Name: Curtis [name] MRN: [id number] Date of Birth: May 13, 1945

## 2017-01-21 ENCOUNTER — Telehealth (HOSPITAL_COMMUNITY): Payer: Self-pay | Admitting: Family Medicine

## 2017-01-21 ENCOUNTER — Ambulatory Visit (HOSPITAL_COMMUNITY): Payer: PPO

## 2017-01-21 NOTE — Telephone Encounter (Signed)
01/21/17  pt left a message that he woke up with his stomach in knots and just won't be here today

## 2017-01-23 ENCOUNTER — Encounter (HOSPITAL_COMMUNITY): Payer: Self-pay

## 2017-01-23 ENCOUNTER — Ambulatory Visit (HOSPITAL_COMMUNITY): Payer: PPO

## 2017-01-23 DIAGNOSIS — R29898 Other symptoms and signs involving the musculoskeletal system: Secondary | ICD-10-CM

## 2017-01-23 DIAGNOSIS — M25511 Pain in right shoulder: Secondary | ICD-10-CM | POA: Diagnosis not present

## 2017-01-23 DIAGNOSIS — M25611 Stiffness of right shoulder, not elsewhere classified: Secondary | ICD-10-CM

## 2017-01-23 NOTE — Therapy (Signed)
[place], [place], [address] Phone: [phone]   Fax:  [phone]  Occupational Therapy Treatment  Patient Details  Name: Curtis [name] MRN: [id number] Date of Birth: July 26, 1945 Referring Provider: [name] MD   Encounter Date: 01/23/2017  OT End of Session - 01/23/17 0834    Visit Number  4    Number of Visits  12    Date for OT Re-Evaluation  02/20/17 mini reassess: 02/06/17    Authorization Type  [employer] $15 co-pay No visit limit    OT Start Time  0817    OT Stop Time  0900    OT Time Calculation (min)  43 min    Activity Tolerance  Patient tolerated treatment well    Behavior During Therapy  [facility] for tasks assessed/performed       Past Medical History:  Diagnosis Date  . Arthritis   . Asthma   . Atrial fibrillation ([place])   . Cataract   . Cervical spondylosis   . Chest pain    Emergency room October 24, 2011, no MI  //   Nuclear, October, 2013, adenosine, EF 61%, no scar or ischemia  . Dyslipidemia    Statin intolerant  . Dyspnea   . GERD (gastroesophageal reflux disease)   . Glaucoma   . Hearing loss   . Hypertension   . Hypertension   . Idiopathic pulmonary fibrosis ([place])   . Interstitial pulmonary fibrosis ([place]) 2012  . Left shoulder pain 2011  . Myocardial infarction ([place])   . Psoriasis   . Pulmonary fibrosis ([place])   . Statin intolerance     Past Surgical History:  Procedure Laterality Date  . BRAIN SURGERY    . CARDIOVERSION N/A 12/13/2015   Procedure: CARDIOVERSION;  Surgeon: Josue Hector, MD;  Location: [place];  Service: Cardiovascular;  Laterality: N/A;  . CATARACT EXTRACTION W/PHACO Right 06/11/2012   Procedure: CATARACT EXTRACTION PHACO AND INTRAOCULAR LENS PLACEMENT ([place]);  Surgeon: Tonny Branch, MD;  Location: [facility];  Service: Ophthalmology;  Laterality: Right;  CDE:  14.43  . CATARACT EXTRACTION W/PHACO Left 06/29/2012   Procedure: CATARACT  EXTRACTION PHACO AND INTRAOCULAR LENS PLACEMENT (I[facility]);  Surgeon: Tonny Branch, MD;  Location: [facility];  Service: Ophthalmology;  Laterality: Left;  CDE:14.11  . CHOLECYSTECTOMY  05/08/2012  . CHOLECYSTECTOMY N/A 05/08/2012   Procedure: LAPAROSCOPIC CHOLECYSTECTOMY;  Surgeon: Harl Bowie, MD;  Location: [place];  Service: General;  Laterality: N/A;  . COLONOSCOPY    . LIVER BIOPSY N/A 05/08/2012   Procedure: LIVER BIOPSY;  Surgeon: Harl Bowie, MD;  Location: [place];  Service: General;  Laterality: N/A;  . NECK SURGERY     cervical disc with cliip  . SHOULDER ARTHROSCOPY WITH DISTAL CLAVICLE RESECTION Right 12/12/2016   Procedure: RIGHT SHOULDER ARTHROSCOPY, SUBACROMIAL DECOMPRESSION, DISTAL CLAVICLE RESECTION, WITH POSSIBLE MINI OPEN ROTATOR CUFF REPAIR;  Surgeon: [name], MD;  Location: [place];  Service: Orthopedics;  Laterality: Right;    There were no vitals filed for this visit.  Subjective Assessment - 01/23/17 0834    Subjective   S: It feels stiff today.    Currently in Pain?  No/denies         [facility] OT Assessment - 01/23/17 0834      Assessment   Medical Diagnosis  S/P right shoulder arthroscopic debridement      Precautions   Precautions  Shoulder    Type of Shoulder Precautions  No heavy lifting.                OT Treatments/Exercises (OP) - 01/23/17 0835      Exercises   Exercises  Shoulder      Shoulder Exercises: Supine   Protraction  PROM;5 reps;AAROM;12 reps    Horizontal ABduction  PROM;5 reps;AAROM;12 reps    External Rotation  PROM;5 reps;AAROM;12 reps    Internal Rotation  PROM;5 reps;AAROM;12 reps    Flexion  PROM;5 reps;AAROM;12 reps    ABduction  PROM;5 reps;AAROM;12 reps      Shoulder Exercises: Seated   Protraction  AAROM;12 reps    Horizontal ABduction  AAROM;12 reps    External Rotation  AAROM;12 reps    Internal Rotation  AAROM;12 reps    Flexion  AAROM;12 reps    Abduction  AAROM;12 reps      Shoulder Exercises:  Standing   Extension  Theraband;10 reps    Theraband Level (Shoulder Extension)  Level 2 (Red)    Row  Theraband;10 reps    Theraband Level (Shoulder Row)  Level 2 (Red)      Shoulder Exercises: Therapy Ball   Right/Left  5 reps      Shoulder Exercises: ROM/Strengthening   Wall Wash  1'    Proximal Shoulder Strengthening, Supine  12X no rest breaks      Manual Therapy   Manual Therapy  Myofascial release    Manual therapy comments  Manual therapy completed prior to exercises.    Myofascial Release  Myofascial release and manual stretching completed to right upper arm, trapezius, and scapular region.               OT Short Term Goals - 01/14/17 0848      OT SHORT TERM GOAL #1   Title  Patient will be educated and independent with HEP to facilitate progress in therapy which will increase his use of his RUE when completing daily tasks.     Time  3    Period  Weeks    Status  On-going      OT SHORT TERM GOAL #2   Title  Patient will increase P/ROM of RUE to WNL to increase ability to reach behind head with less difficulty.     Time  3    Period  Weeks    Status  On-going      OT SHORT TERM GOAL #3   Title  Patient will increase RUE strength to 3+/5 to increase ability to complete lift weight lifting tasks at home.    Time  3    Period  Weeks    Status  On-going      OT SHORT TERM GOAL #4   Title  Patient will decrease fascial restrictions in right arm to mod amount to increase functional mobility needed for reaching tasks.     Time  3    Period  Weeks    Status  On-going      OT SHORT TERM GOAL #5   Title  Patient will decrease pain level in RUE to 4/10 or less during daily tasks.     Time  3    Period  Weeks    Status  On-going        OT Long Term Goals - 01/14/17 0848      OT LONG TERM GOAL #1   Title  Patient will return to his highest level of independence while using his RUE as his dominant  extremity for 100% of daily tasks.     Time  6    Period   Weeks    Status  On-going      OT LONG TERM GOAL #2   Title  Patient increase A/ROM of RUE to WNL to increase ability to reach behind back with increased comfort.     Time  6    Period  Weeks    Status  On-going      OT LONG TERM GOAL #3   Title  Patient will increase RUE strength to 4/5 to increase ability to return to normal household lifting tasks.     Time  6    Period  Weeks    Status  On-going      OT LONG TERM GOAL #4   Title  Patient will decrease pain level in RUE to 2/10 or when completing daily tasks.     Time  6    Period  Weeks    Status  On-going      OT LONG TERM GOAL #5   Title  Patient will decrease fascial restrictions in RUE to min amount or less to increase functional mobility needed to complete overhead reaching tasks.     Time  6    Period  Weeks    Status  On-going            Plan - 01/23/17 9381    Clinical Impression Statement  A: Increased repetitions to 12 supine and seated. Added therapy ball circles and scapular theraband to increase shoulder ROM and strength. VC for form and technique. Patient reports increased soreness at end of session although completed all exercises well.     Plan  P: Work on form during scapular theraband extension. Decreased trapezius elevation. Added seated proximal shoulder strengthening.     Consulted and Agree with Plan of Care  Patient       Patient will benefit from skilled therapeutic intervention in order to improve the following deficits and impairments:  Decreased strength, Decreased range of motion, Pain, Impaired UE functional use, Increased fascial restrictions  Visit Diagnosis: Stiffness of right shoulder, not elsewhere classified  Other symptoms and signs involving the musculoskeletal system  Acute pain of right shoulder    Problem List Patient Active Problem List   Diagnosis Date Noted  . Impingement syndrome of right shoulder 12/12/2016  . AC (acromioclavicular) arthritis 12/12/2016  .  Partial tear of right rotator cuff 12/12/2016  . Chronic respiratory failure ([place]) 04/26/2016  . Hyperbilirubinemia 04/26/2016  . H/O asbestos exposure 03/15/2016  . Persistent atrial fibrillation ([place]) 02/19/2016  . Atrial fibrillation, persistent ([place]) 02/19/2016  . IPF (idiopathic pulmonary fibrosis) ([place]) 02/09/2016  . Incidental lung nodule, greater than or equal to 84mm 02/09/2016  . Bell's palsy 08/03/2015  . Insomnia 06/08/2015  . Right trigeminal neuralgia 04/01/2014  . Chronic back pain 03/01/2014  . Elevated transaminase level 06/25/2013  . Obesity (BMI 30-39.9) 09/09/2012  . Chronic neck pain 09/09/2012  . Symptomatic cholelithiasis 04/14/2012  . Chest pain   . Statin intolerance   . Dyslipidemia   . GERD (gastroesophageal reflux disease)   . Hypertension   . INTERSTITIAL LUNG DISEASE 03/16/2010  . PSORIASIS 03/16/2010   Ailene Ravel, OTR/L,CBIS  [phone]  01/23/2017, 9:04 AM  [facility] [address] [place], [place], [address] Phone: [phone]   Fax:  [phone]  Name: Curtis [name] MRN: [id number] Date of Birth: 11-03-1945

## 2017-01-28 ENCOUNTER — Encounter (HOSPITAL_COMMUNITY): Payer: Self-pay

## 2017-01-28 ENCOUNTER — Ambulatory Visit (HOSPITAL_COMMUNITY): Payer: PPO

## 2017-01-28 DIAGNOSIS — M25611 Stiffness of right shoulder, not elsewhere classified: Secondary | ICD-10-CM

## 2017-01-28 DIAGNOSIS — M25511 Pain in right shoulder: Secondary | ICD-10-CM

## 2017-01-28 DIAGNOSIS — R29898 Other symptoms and signs involving the musculoskeletal system: Secondary | ICD-10-CM

## 2017-01-28 NOTE — Patient Instructions (Addendum)
Repeat all exercises 10-15 times, 1-2 times per day.  1) Shoulder Protraction    Begin with elbows by your side, slowly "punch" straight out in front of you.      2) Shoulder Flexion  Standing:         Begin with arms at your side with thumbs pointed up, slowly raise both arms up and forward towards overhead.      3) Horizontal abduction/adduction  Standing:           Begin with arms straight out in front of you, bring out to the side in at "T" shape. Keep arms straight entire time.     4) Internal & External Rotation       5) Shoulder Abduction  Standing:       Lying on your back begin with your arms flat on the table next to your side. Slowly move your arms out to the side so that they go overhead, in a jumping jack or snow angel movement.

## 2017-01-28 NOTE — Therapy (Addendum)
[place], [place], [address] Phone: [phone]   Fax:  [phone]  Occupational Therapy Treatment  Patient Details  Name: Curtis [name] MRN: [id number] Date of Birth: 1945-10-02 Referring Provider: [name] MD   Encounter Date: 01/28/2017  OT End of Session - 01/28/17 1005    Visit Number  5    Number of Visits  12    Date for OT Re-Evaluation  02/20/17 mini reassess: 02/06/17    Authorization Type  [employer] $15 co-pay No visit limit    OT Start Time  0819    OT Stop Time  0900    OT Time Calculation (min)  41 min    Activity Tolerance  Patient tolerated treatment well    Behavior During Therapy  [facility] for tasks assessed/performed       Past Medical History:  Diagnosis Date  . Arthritis   . Asthma   . Atrial fibrillation ([place])   . Cataract   . Cervical spondylosis   . Chest pain    Emergency room October 24, 2011, no MI  //   Nuclear, October, 2013, adenosine, EF 61%, no scar or ischemia  . Dyslipidemia    Statin intolerant  . Dyspnea   . GERD (gastroesophageal reflux disease)   . Glaucoma   . Hearing loss   . Hypertension   . Hypertension   . Idiopathic pulmonary fibrosis ([place])   . Interstitial pulmonary fibrosis ([place]) 2012  . Left shoulder pain 2011  . Myocardial infarction ([place])   . Psoriasis   . Pulmonary fibrosis ([place])   . Statin intolerance     Past Surgical History:  Procedure Laterality Date  . BRAIN SURGERY    . CARDIOVERSION N/A 12/13/2015   Procedure: CARDIOVERSION;  Surgeon: Josue Hector, MD;  Location: [place];  Service: Cardiovascular;  Laterality: N/A;  . CATARACT EXTRACTION W/PHACO Right 06/11/2012   Procedure: CATARACT EXTRACTION PHACO AND INTRAOCULAR LENS PLACEMENT ([place]);  Surgeon: Tonny Branch, MD;  Location[facility];  Service: Ophthalmology;  Laterality: Right;  CDE:  14.43  . CATARACT EXTRACTION W/PHACO Left 06/29/2012   Procedure: CATARACT  EXTRACTION PHACO AND INTRAOCULAR LENS PLACEMENT ([facility]);  Surgeon: Tonny Branch, MD;  [facility];  Service: Ophthalmology;  Laterality: Left;  CDE:14.11  . CHOLECYSTECTOMY  05/08/2012  . CHOLECYSTECTOMY N/A 05/08/2012   Procedure: LAPAROSCOPIC CHOLECYSTECTOMY;  Surgeon: Harl Bowie, MD;  Location: [facility];  Service: General;  Laterality: N/A;  . COLONOSCOPY    . LIVER BIOPSY N/A 05/08/2012   Procedure: LIVER BIOPSY;  Surgeon: Harl Bowie, MD;  Location: [facility];  Service: General;  Laterality: N/A;  . NECK SURGERY     cervical disc with cliip  . SHOULDER ARTHROSCOPY WITH DISTAL CLAVICLE RESECTION Right 12/12/2016   Procedure: RIGHT SHOULDER ARTHROSCOPY, SUBACROMIAL DECOMPRESSION, DISTAL CLAVICLE RESECTION, WITH POSSIBLE MINI OPEN ROTATOR CUFF REPAIR;  Surgeon: [name], MD;  Location: [facility];  Service: Orthopedics;  Laterality: Right;    There were no vitals filed for this visit.  Subjective Assessment - 01/28/17 0835    Subjective   S: It's hurting a little bit more today.    Currently in Pain?  Yes    Pain Score  3     Pain Location  Shoulder    Pain Orientation  Right    Pain Descriptors / Indicators  Sore    Pain Type  Acute pain    Pain Radiating Towards  N/A    Pain Onset  In the past 7 days    Pain Frequency  Constant    Aggravating Factors   Increased movement and exercises    Pain Relieving Factors  pain medication, heat pad    Effect of Pain on Daily Activities  mod effect    Multiple Pain Sites  No         [facility] OT Assessment - 01/28/17 0836      Assessment   Medical Diagnosis  S/P right shoulder arthroscopic debridement      Precautions   Precautions  Shoulder    Type of Shoulder Precautions  No heavy lifting.                OT Treatments/Exercises (OP) - 01/28/17 9381      Exercises   Exercises  Shoulder      Shoulder Exercises: Supine   Protraction  PROM;5 reps;AROM;10 reps    Horizontal ABduction  PROM;5 reps;AROM;10 reps     External Rotation  PROM;5 reps;AROM;10 reps slight abduction    Internal Rotation  PROM;5 reps;AROM;10 reps slight abduction    Flexion  PROM;5 reps;AROM;10 reps    ABduction  PROM;5 reps;AROM;10 reps      Shoulder Exercises: Seated   Protraction  AROM;10 reps    Horizontal ABduction  AROM;10 reps    External Rotation  AROM;10 reps abducted slightly    Internal Rotation  AROM;10 reps slightly abducted    Flexion  AROM;10 reps    Abduction  AROM;10 reps      Shoulder Exercises: ROM/Strengthening   Proximal Shoulder Strengthening, Supine  12X no rest breaks    Proximal Shoulder Strengthening, Seated  12X no rest breaks    Other ROM/Strengthening Exercises  Horzontal abduction with serratus anterior plus, red band, 3 times going up      Manual Therapy   Manual Therapy  Joint mobilization    Manual therapy comments  Manual therapy completed prior to exercises.    Joint Mobilization  Joint mobilization completed to right upper arm to increase ROM and mobilize soft tissue and joint.                 OT Short Term Goals - 01/14/17 0848      OT SHORT TERM GOAL #1   Title  Patient will be educated and independent with HEP to facilitate progress in therapy which will increase his use of his RUE when completing daily tasks.     Time  3    Period  Weeks    Status  On-going      OT SHORT TERM GOAL #2   Title  Patient will increase P/ROM of RUE to WNL to increase ability to reach behind head with less difficulty.     Time  3    Period  Weeks    Status  On-going      OT SHORT TERM GOAL #3   Title  Patient will increase RUE strength to 3+/5 to increase ability to complete lift weight lifting tasks at home.    Time  3    Period  Weeks    Status  On-going      OT SHORT TERM GOAL #4   Title  Patient will decrease fascial restrictions in right arm to mod amount to increase functional mobility needed for reaching tasks.     Time  3    Period  Weeks    Status  On-going  OT  SHORT TERM GOAL #5   Title  Patient will decrease pain level in RUE to 4/10 or less during daily tasks.     Time  3    Period  Weeks    Status  On-going        OT Long Term Goals - 01/14/17 0848      OT LONG TERM GOAL #1   Title  Patient will return to his highest level of independence while using his RUE as his dominant extremity for 100% of daily tasks.     Time  6    Period  Weeks    Status  On-going      OT LONG TERM GOAL #2   Title  Patient increase A/ROM of RUE to WNL to increase ability to reach behind back with increased comfort.     Time  6    Period  Weeks    Status  On-going      OT LONG TERM GOAL #3   Title  Patient will increase RUE strength to 4/5 to increase ability to return to normal household lifting tasks.     Time  6    Period  Weeks    Status  On-going      OT LONG TERM GOAL #4   Title  Patient will decrease pain level in RUE to 2/10 or when completing daily tasks.     Time  6    Period  Weeks    Status  On-going      OT LONG TERM GOAL #5   Title  Patient will decrease fascial restrictions in RUE to min amount or less to increase functional mobility needed to complete overhead reaching tasks.     Time  6    Period  Weeks    Status  On-going            Plan - 01/28/17 1007    Clinical Impression Statement  A: Focused on joint mobilization techniques during manual therapy and increased to A/ROM supine and seated. Added scapular stability and serratus anterior strengthening using red band.     Plan  P: Continue with scapular strengthening. Work on increasing joint mobility during passive ROM.     Consulted and Agree with Plan of Care  Patient       Patient will benefit from skilled therapeutic intervention in order to improve the following deficits and impairments:  Decreased strength, Decreased range of motion, Pain, Impaired UE functional use, Increased fascial restrictions  Visit Diagnosis: Stiffness of right shoulder, not elsewhere  classified  Other symptoms and signs involving the musculoskeletal system  Acute pain of right shoulder    Problem List Patient Active Problem List   Diagnosis Date Noted  . Impingement syndrome of right shoulder 12/12/2016  . AC (acromioclavicular) arthritis 12/12/2016  . Partial tear of right rotator cuff 12/12/2016  . Chronic respiratory failure ([place]) 04/26/2016  . Hyperbilirubinemia 04/26/2016  . H/O asbestos exposure 03/15/2016  . Persistent atrial fibrillation ([place]) 02/19/2016  . Atrial fibrillation, persistent ([place]) 02/19/2016  . IPF (idiopathic pulmonary fibrosis) ([place]) 02/09/2016  . Incidental lung nodule, greater than or equal to 64mm 02/09/2016  . Bell's palsy 08/03/2015  . Insomnia 06/08/2015  . Right trigeminal neuralgia 04/01/2014  . Chronic back pain 03/01/2014  . Elevated transaminase level 06/25/2013  . Obesity (BMI 30-39.9) 09/09/2012  . Chronic neck pain 09/09/2012  . Symptomatic cholelithiasis 04/14/2012  . Chest pain   . Statin intolerance   .  Dyslipidemia   . GERD (gastroesophageal reflux disease)   . Hypertension   . INTERSTITIAL LUNG DISEASE 03/16/2010  . PSORIASIS 03/16/2010   Ailene Ravel, OTR/L,CBIS  [phone]  01/28/2017, 11:48 AM  [facility] [address] [place], [place], [address] Phone: [phone]   Fax:  [phone]  Name: Selim Durden MRN: [id number] Date of Birth: 1945-09-16

## 2017-01-31 ENCOUNTER — Other Ambulatory Visit: Payer: Self-pay | Admitting: Family Medicine

## 2017-01-31 ENCOUNTER — Ambulatory Visit (HOSPITAL_COMMUNITY): Payer: PPO

## 2017-01-31 DIAGNOSIS — M25611 Stiffness of right shoulder, not elsewhere classified: Secondary | ICD-10-CM

## 2017-01-31 DIAGNOSIS — M25511 Pain in right shoulder: Secondary | ICD-10-CM

## 2017-01-31 DIAGNOSIS — R29898 Other symptoms and signs involving the musculoskeletal system: Secondary | ICD-10-CM

## 2017-01-31 NOTE — Therapy (Signed)
[place], [place], [address] Phone: [phone]   Fax:  [phone]  Occupational Therapy Treatment  Patient Details  Name: Curtis [name] MRN: [id number] Date of Birth: 1945-11-11 Referring Provider: [name] MD   Encounter Date: 01/31/2017  OT End of Session - 01/31/17 0907    Visit Number  6    Number of Visits  12    Date for OT Re-Evaluation  02/20/17 mini reassess: 02/06/17    Authorization Type  [employer] $15 co-pay No visit limit    OT Start Time  0820    OT Stop Time  0903    OT Time Calculation (min)  43 min    Activity Tolerance  Patient tolerated treatment well    Behavior During Therapy  [facility] for tasks assessed/performed       Past Medical History:  Diagnosis Date  . Arthritis   . Asthma   . Atrial fibrillation ([place])   . Cataract   . Cervical spondylosis   . Chest pain    Emergency room October 24, 2011, no MI  //   Nuclear, October, 2013, adenosine, EF 61%, no scar or ischemia  . Dyslipidemia    Statin intolerant  . Dyspnea   . GERD (gastroesophageal reflux disease)   . Glaucoma   . Hearing loss   . Hypertension   . Hypertension   . Idiopathic pulmonary fibrosis ([place])   . Interstitial pulmonary fibrosis ([place]) 2012  . Left shoulder pain 2011  . Myocardial infarction ([place])   . Psoriasis   . Pulmonary fibrosis ([place])   . Statin intolerance     Past Surgical History:  Procedure Laterality Date  . BRAIN SURGERY    . CARDIOVERSION N/A 12/13/2015   Procedure: CARDIOVERSION;  Surgeon: Josue Hector, MD;  Location: [place];  Service: Cardiovascular;  Laterality: N/A;  . CATARACT EXTRACTION W/PHACO Right 06/11/2012   Procedure: CATARACT EXTRACTION PHACO AND INTRAOCULAR LENS PLACEMENT ([place]);  Surgeon: Tonny Branch, MD;  [facility];  Service: Ophthalmology;  Laterality: Right;  CDE:  14.43  . CATARACT EXTRACTION W/PHACO Left 06/29/2012   Procedure: CATARACT  EXTRACTION PHACO AND INTRAOCULAR LENS PLACEMENT (IOC);  Surgeon: Tonny Branch, MD;  Location: [facility];  Service: Ophthalmology;  Laterality: Left;  CDE:14.11  . CHOLECYSTECTOMY  05/08/2012  . CHOLECYSTECTOMY N/A 05/08/2012   Procedure: LAPAROSCOPIC CHOLECYSTECTOMY;  Surgeon: Harl Bowie, MD;  Location: [place];  Service: General;  Laterality: N/A;  . COLONOSCOPY    . LIVER BIOPSY N/A 05/08/2012   Procedure: LIVER BIOPSY;  Surgeon: Harl Bowie, MD;  Location: [place];  Service: General;  Laterality: N/A;  . NECK SURGERY     cervical disc with cliip  . SHOULDER ARTHROSCOPY WITH DISTAL CLAVICLE RESECTION Right 12/12/2016   Procedure: RIGHT SHOULDER ARTHROSCOPY, SUBACROMIAL DECOMPRESSION, DISTAL CLAVICLE RESECTION, WITH POSSIBLE MINI OPEN ROTATOR CUFF REPAIR;  Surgeon: [name], MD;  Location: [place];  Service: Orthopedics;  Laterality: Right;    There were no vitals filed for this visit.                OT Treatments/Exercises (OP) - 01/31/17 5009      Exercises   Exercises  Shoulder      Shoulder Exercises: Supine   Protraction  PROM;5 reps;AROM;10 reps    Horizontal ABduction  PROM;5 reps;AROM;10 reps    External Rotation  PROM;5 reps;AROM;10 reps slightly abducted    Internal Rotation  PROM;5 reps;AROM;10 reps slightly abducted    Flexion  PROM;5 reps;AROM;10 reps    ABduction  PROM;5 reps;AROM;10 reps      Shoulder Exercises: Seated   Protraction  AROM;10 reps    Horizontal ABduction  AROM;10 reps    External Rotation  AROM;10 reps slightly abducted    Internal Rotation  AROM;10 reps slightly abducted    Flexion  AROM;10 reps    Abduction  AROM;10 reps      Shoulder Exercises: Standing   Extension  Theraband;12 reps    Theraband Level (Shoulder Extension)  Level 2 (Red)    Row  Theraband;12 reps    Theraband Level (Shoulder Row)  Level 2 (Red)    Retraction  Theraband;12 reps    Theraband Level (Shoulder Retraction)  Level 2 (Red)      Shoulder  Exercises: ROM/Strengthening   UBE (Upper Arm Bike)  Level 1 2' forward 2' reverse      Manual Therapy   Manual Therapy  Joint mobilization;Myofascial release;Muscle Energy Technique    Manual therapy comments  Manual therapy completed prior to exercises.    Joint Mobilization  Joint mobilization completed to right upper arm to increase ROM and mobilize soft tissue and joint.      Myofascial Release  Myofascial release and manual stretching completed to right upper arm, trapezius, and scapular region.    Muscle Energy Technique  Muscle energy technique completed to right anterior deltoid to relax tone and muscle spasm and improve range of motion.                 OT Short Term Goals - 01/14/17 0848      OT SHORT TERM GOAL #1   Title  Patient will be educated and independent with HEP to facilitate progress in therapy which will increase his use of his RUE when completing daily tasks.     Time  3    Period  Weeks    Status  On-going      OT SHORT TERM GOAL #2   Title  Patient will increase P/ROM of RUE to WNL to increase ability to reach behind head with less difficulty.     Time  3    Period  Weeks    Status  On-going      OT SHORT TERM GOAL #3   Title  Patient will increase RUE strength to 3+/5 to increase ability to complete lift weight lifting tasks at home.    Time  3    Period  Weeks    Status  On-going      OT SHORT TERM GOAL #4   Title  Patient will decrease fascial restrictions in right arm to mod amount to increase functional mobility needed for reaching tasks.     Time  3    Period  Weeks    Status  On-going      OT SHORT TERM GOAL #5   Title  Patient will decrease pain level in RUE to 4/10 or less during daily tasks.     Time  3    Period  Weeks    Status  On-going        OT Long Term Goals - 01/14/17 0848      OT LONG TERM GOAL #1   Title  Patient will return to his highest level of independence while using his RUE as his dominant extremity for 100%  of daily tasks.     Time  6  Period  Weeks    Status  On-going      OT LONG TERM GOAL #2   Title  Patient increase A/ROM of RUE to WNL to increase ability to reach behind back with increased comfort.     Time  6    Period  Weeks    Status  On-going      OT LONG TERM GOAL #3   Title  Patient will increase RUE strength to 4/5 to increase ability to return to normal household lifting tasks.     Time  6    Period  Weeks    Status  On-going      OT LONG TERM GOAL #4   Title  Patient will decrease pain level in RUE to 2/10 or when completing daily tasks.     Time  6    Period  Weeks    Status  On-going      OT LONG TERM GOAL #5   Title  Patient will decrease fascial restrictions in RUE to min amount or less to increase functional mobility needed to complete overhead reaching tasks.     Time  6    Period  Weeks    Status  On-going            Plan - 01/31/17 0908    Clinical Impression Statement  A: Pt reports that he was feeling stiff and sore this morning. Pt with large trigger point in right medial deltoid which caused pain during external rotation. Added retraction for scapular strength using red band. Pt required verbal and min physical cueing for proper form and technique during new exercises.     Plan  P: Increase A/ROM repetitions to 12 if able to tolerate. Continue to work on increasing joint mobility during passive ROM for external rotation as pain allows. Add therapy ball circles to increase internal rotation.     Consulted and Agree with Plan of Care  Patient       Patient will benefit from skilled therapeutic intervention in order to improve the following deficits and impairments:  Decreased strength, Decreased range of motion, Pain, Impaired UE functional use, Increased fascial restrictions  Visit Diagnosis: Stiffness of right shoulder, not elsewhere classified  Other symptoms and signs involving the musculoskeletal system  Acute pain of right  shoulder    Problem List Patient Active Problem List   Diagnosis Date Noted  . Impingement syndrome of right shoulder 12/12/2016  . AC (acromioclavicular) arthritis 12/12/2016  . Partial tear of right rotator cuff 12/12/2016  . Chronic respiratory failure ([place]) 04/26/2016  . Hyperbilirubinemia 04/26/2016  . H/O asbestos exposure 03/15/2016  . Persistent atrial fibrillation ([place]) 02/19/2016  . Atrial fibrillation, persistent ([place]) 02/19/2016  . IPF (idiopathic pulmonary fibrosis) ([place]) 02/09/2016  . Incidental lung nodule, greater than or equal to 17mm 02/09/2016  . Bell's palsy 08/03/2015  . Insomnia 06/08/2015  . Right trigeminal neuralgia 04/01/2014  . Chronic back pain 03/01/2014  . Elevated transaminase level 06/25/2013  . Obesity (BMI 30-39.9) 09/09/2012  . Chronic neck pain 09/09/2012  . Symptomatic cholelithiasis 04/14/2012  . Chest pain   . Statin intolerance   . Dyslipidemia   . GERD (gastroesophageal reflux disease)   . Hypertension   . INTERSTITIAL LUNG DISEASE 03/16/2010  . PSORIASIS 03/16/2010   Ailene Ravel, OTR/L,CBIS  [phone]  01/31/2017, 9:13 AM  [facility] [address] [place], [place], [address] Phone: [phone]   Fax:  [phone]  Name: Curtis [name] MRN: [id number] Date of Birth: [birth year]

## 2017-02-02 NOTE — Telephone Encounter (Signed)
This medicine is currently not on his med list.  He has taken it before in the past.  Because of his age it is recommended to use 5 mg 1 nightly as needed #30.  Please discuss this with patient Monday give this prescription with one additional refills

## 2017-02-03 ENCOUNTER — Telehealth (HOSPITAL_COMMUNITY): Payer: Self-pay | Admitting: Family Medicine

## 2017-02-03 ENCOUNTER — Ambulatory Visit (HOSPITAL_COMMUNITY): Payer: PPO | Admitting: Specialist

## 2017-02-03 NOTE — Telephone Encounter (Signed)
Phone number not working- left message with pharmacy to have patient return call

## 2017-02-03 NOTE — Telephone Encounter (Signed)
02/03/17  wife left a message that he wouldn't be here today

## 2017-02-04 DIAGNOSIS — J8410 Pulmonary fibrosis, unspecified: Secondary | ICD-10-CM | POA: Diagnosis not present

## 2017-02-05 ENCOUNTER — Inpatient Hospital Stay (INDEPENDENT_AMBULATORY_CARE_PROVIDER_SITE_OTHER): Payer: PPO | Admitting: Orthopaedic Surgery

## 2017-02-06 ENCOUNTER — Telehealth (HOSPITAL_COMMUNITY): Payer: Self-pay | Admitting: Family Medicine

## 2017-02-06 ENCOUNTER — Ambulatory Visit (HOSPITAL_COMMUNITY): Payer: PPO

## 2017-02-06 NOTE — Telephone Encounter (Signed)
02/06/17  wife left a message to cx but no reason was given

## 2017-02-11 ENCOUNTER — Encounter (HOSPITAL_COMMUNITY): Payer: Self-pay

## 2017-02-11 ENCOUNTER — Other Ambulatory Visit: Payer: Self-pay

## 2017-02-11 ENCOUNTER — Ambulatory Visit (HOSPITAL_COMMUNITY): Payer: PPO | Attending: Orthopaedic Surgery

## 2017-02-11 DIAGNOSIS — R29898 Other symptoms and signs involving the musculoskeletal system: Secondary | ICD-10-CM | POA: Diagnosis not present

## 2017-02-11 DIAGNOSIS — M25511 Pain in right shoulder: Secondary | ICD-10-CM | POA: Diagnosis not present

## 2017-02-11 DIAGNOSIS — M25611 Stiffness of right shoulder, not elsewhere classified: Secondary | ICD-10-CM | POA: Insufficient documentation

## 2017-02-11 NOTE — Patient Instructions (Addendum)
Complete the following exercises 2-3 times a day.  Doorway Stretch  Place each hand opposite each other on the doorway. (You can change where you feel the stretch by moving arms higher or lower.) Step through with one foot and bend front knee until a stretch is felt and hold. Step through with the opposite foot on the next rep. Hold for __15-60__ seconds. Repeat __2__times.          Internal Rotation Across Back  Grab the end of a towel with your affected side, palm facing backwards. Grab the towel with your unaffected side and pull your affected hand across your back until you feel a stretch in the front of your shoulder. If you feel pain, pull just to the pain, do not pull through the pain. Hold. Return your affected arm to your side. Try to keep your hand/arm close to your body during the entire movement.     Hold for 15-60 seconds. Complete 2 times.          (Home) Extension: Isometric / Bilateral Arm Retraction - Sitting   Facing anchor, hold hands and elbow at shoulder height, with elbow bent.  Pull arms back to squeeze shoulder blades together. Repeat 10-15 times. 1-3 times/day.   (Clinic) Extension / Flexion (Assist)   Face anchor, pull arms back, keeping elbow straight, and squeze shoulder blades together. Repeat 10-15 times. 1-3 times/day.   Copyright  VHI. All rights reserved.   (Home) Retraction: Row - Bilateral (Anchor)   Facing anchor, arms reaching forward, pull hands toward stomach, keeping elbows bent and at your sides and pinching shoulder blades together. Repeat 10-15 times. 1-3 times/day.   Copyright  VHI. All rights reserved.

## 2017-02-11 NOTE — Therapy (Addendum)
[place], [place], [address] Phone: [phone]   Fax:  [phone]  Occupational Therapy Treatment  Patient Details  Name: Curtis [name] MRN: [id number] Date of Birth: Oct 02, 1945 Referring Provider: [name] MD   Encounter Date: 02/11/2017  OT End of Session - 02/11/17 0855    Visit Number  7    Number of Visits  12    Date for OT Re-Evaluation  02/20/17 mini reassess: 02/06/17    Authorization Type  [employer] $15 co-pay No visit limit    OT Start Time  0820    OT Stop Time  0900    OT Time Calculation (min)  40 min    Activity Tolerance  Patient tolerated treatment well    Behavior During Therapy  [facility] for tasks assessed/performed       Past Medical History:  Diagnosis Date  . Arthritis   . Asthma   . Atrial fibrillation ([place])   . Cataract   . Cervical spondylosis   . Chest pain    Emergency room October 24, 2011, no MI  //   Nuclear, October, 2013, adenosine, EF 61%, no scar or ischemia  . Dyslipidemia    Statin intolerant  . Dyspnea   . GERD (gastroesophageal reflux disease)   . Glaucoma   . Hearing loss   . Hypertension   . Hypertension   . Idiopathic pulmonary fibrosis ([place])   . Interstitial pulmonary fibrosis ([place]) 2012  . Left shoulder pain 2011  . Myocardial infarction ([place])   . Psoriasis   . Pulmonary fibrosis ([place])   . Statin intolerance     Past Surgical History:  Procedure Laterality Date  . BRAIN SURGERY    . CARDIOVERSION N/A 12/13/2015   Procedure: CARDIOVERSION;  Surgeon: Josue Hector, MD;  Location: [facility];  Service: Cardiovascular;  Laterality: N/A;  . CATARACT EXTRACTION W/PHACO Right 06/11/2012   Procedure: CATARACT EXTRACTION PHACO AND INTRAOCULAR LENS PLACEMENT ([place]);  Surgeon: Tonny Branch, MD;  [facility];  Service: Ophthalmology;  Laterality: Right;  CDE:  14.43  . CATARACT EXTRACTION W/PHACO Left 06/29/2012   Procedure: CATARACT  EXTRACTION PHACO AND INTRAOCULAR LENS PLACEMENT (I[facility]);  Surgeon: Tonny Branch, MD;  [facility]: [facility];  Service: Ophthalmology;  Laterality: Left;  CDE:14.11  . CHOLECYSTECTOMY  05/08/2012  . CHOLECYSTECTOMY N/A 05/08/2012   Procedure: LAPAROSCOPIC CHOLECYSTECTOMY;  Surgeon: Harl Bowie, MD;  Location: [place];  Service: General;  Laterality: N/A;  . COLONOSCOPY    . LIVER BIOPSY N/A 05/08/2012   Procedure: LIVER BIOPSY;  Surgeon: Harl Bowie, MD;  Location: [place];  Service: General;  Laterality: N/A;  . NECK SURGERY     cervical disc with cliip  . SHOULDER ARTHROSCOPY WITH DISTAL CLAVICLE RESECTION Right 12/12/2016   Procedure: RIGHT SHOULDER ARTHROSCOPY, SUBACROMIAL DECOMPRESSION, DISTAL CLAVICLE RESECTION, WITH POSSIBLE MINI OPEN ROTATOR CUFF REPAIR;  Surgeon: [name], MD;  Location: [place];  Service: Orthopedics;  Laterality: Right;    There were no vitals filed for this visit.  Subjective Assessment - 02/11/17 0847    Subjective   S: It's attached.    Currently in Pain?  No/denies         [facility] OT Assessment - 02/11/17 0848      Assessment   Medical Diagnosis  S/P right shoulder arthroscopic debridement      Precautions   Precautions  Shoulder    Type of Shoulder Precautions  No  heavy lifting.                OT Treatments/Exercises (OP) - 02/11/17 0841      Exercises   Exercises  Shoulder      Shoulder Exercises: Supine   Protraction  PROM;5 reps    Horizontal ABduction  PROM;5 reps;AROM;15 reps    External Rotation  PROM;5 reps;AROM;15 reps slighty abducted    Internal Rotation  PROM;5 reps;AROM;15 reps slightly abducted    Flexion  PROM;5 reps;AROM;15 reps    ABduction  PROM;5 reps;AROM;15 reps      Shoulder Exercises: Standing   Horizontal ABduction  Strengthening;10 reps    Horizontal ABduction Weight (lbs)  1    Flexion  Strengthening;10 reps    Shoulder Flexion Weight (lbs)  1    ABduction  Strengthening;10 reps    Shoulder ABduction  Weight (lbs)  1    Extension  Theraband;10 reps    Theraband Level (Shoulder Extension)  Level 2 (Red)    Row  Theraband;10 reps    Theraband Level (Shoulder Row)  Level 2 (Red)    Retraction  Theraband;10 reps    Theraband Level (Shoulder Retraction)  Level 2 (Red)      Shoulder Exercises: ROM/Strengthening   UBE (Upper Arm Bike)  Level 2 2' forward 2' reverse    Other ROM/Strengthening Exercises  Horzontal abduction with serratus anterior plus, red band, 5 times going up      Shoulder Exercises: Stretch   Corner Stretch  2 reps doorway; 15"    Internal Rotation Stretch  2 reps horizontal towel; 15"             OT Education - 02/11/17 0847    Education provided  Yes    Education Details  doorway stretch and internal rotation stretch. red theraband for scapular strengthening.    Person(s) Educated  Patient    Methods  Explanation;Demonstration;Handout;Verbal cues;Tactile cues    Comprehension  Returned demonstration;Verbalized understanding       OT Short Term Goals - 01/14/17 0848      OT SHORT TERM GOAL #1   Title  Patient will be educated and independent with HEP to facilitate progress in therapy which will increase his use of his RUE when completing daily tasks.     Time  3    Period  Weeks    Status  On-going      OT SHORT TERM GOAL #2   Title  Patient will increase P/ROM of RUE to WNL to increase ability to reach behind head with less difficulty.     Time  3    Period  Weeks    Status  On-going      OT SHORT TERM GOAL #3   Title  Patient will increase RUE strength to 3+/5 to increase ability to complete lift weight lifting tasks at home.    Time  3    Period  Weeks    Status  On-going      OT SHORT TERM GOAL #4   Title  Patient will decrease fascial restrictions in right arm to mod amount to increase functional mobility needed for reaching tasks.     Time  3    Period  Weeks    Status  On-going      OT SHORT TERM GOAL #5   Title  Patient will decrease  pain level in RUE to 4/10 or less during daily tasks.     Time  3  Period  Weeks    Status  On-going        OT Long Term Goals - 01/14/17 0848      OT LONG TERM GOAL #1   Title  Patient will return to his highest level of independence while using his RUE as his dominant extremity for 100% of daily tasks.     Time  6    Period  Weeks    Status  On-going      OT LONG TERM GOAL #2   Title  Patient increase A/ROM of RUE to WNL to increase ability to reach behind back with increased comfort.     Time  6    Period  Weeks    Status  On-going      OT LONG TERM GOAL #3   Title  Patient will increase RUE strength to 4/5 to increase ability to return to normal household lifting tasks.     Time  6    Period  Weeks    Status  On-going      OT LONG TERM GOAL #4   Title  Patient will decrease pain level in RUE to 2/10 or when completing daily tasks.     Time  6    Period  Weeks    Status  On-going      OT LONG TERM GOAL #5   Title  Patient will decrease fascial restrictions in RUE to min amount or less to increase functional mobility needed to complete overhead reaching tasks.     Time  6    Period  Weeks    Status  On-going            Plan - 02/11/17 6599    Clinical Impression Statement  A: Pt reports that his right arm is moving pretty much like his left arm at this point. He reports feeling stiff today. Was able to complete strengthening standing with 1# with some muscle fatigue and rest breaks as needed. VC for form and technique.     Plan  P: Patient may possibly want next session to be his last. If so reassess and discharge. Follow up on HEP. Continue with strengthening using theraband.     OT Home Exercise Plan  2/5: red band scapular strengthening. shoulder stretches    Consulted and Agree with Plan of Care  Patient       Patient will benefit from skilled therapeutic intervention in order to improve the following deficits and impairments:  Decreased strength,  Decreased range of motion, Pain, Impaired UE functional use, Increased fascial restrictions  Visit Diagnosis: Stiffness of right shoulder, not elsewhere classified  Other symptoms and signs involving the musculoskeletal system  Acute pain of right shoulder    Problem List Patient Active Problem List   Diagnosis Date Noted  . Impingement syndrome of right shoulder 12/12/2016  . AC (acromioclavicular) arthritis 12/12/2016  . Partial tear of right rotator cuff 12/12/2016  . Chronic respiratory failure ([place]) 04/26/2016  . Hyperbilirubinemia 04/26/2016  . H/O asbestos exposure 03/15/2016  . Persistent atrial fibrillation ([place]) 02/19/2016  . Atrial fibrillation, persistent ([place]) 02/19/2016  . IPF (idiopathic pulmonary fibrosis) ([place]) 02/09/2016  . Incidental lung nodule, greater than or equal to 24m 02/09/2016  . Bell's palsy 08/03/2015  . Insomnia 06/08/2015  . Right trigeminal neuralgia 04/01/2014  . Chronic back pain 03/01/2014  . Elevated transaminase level 06/25/2013  . Obesity (BMI 30-39.9) 09/09/2012  . Chronic neck pain 09/09/2012  . Symptomatic cholelithiasis  04/14/2012  . Chest pain   . Statin intolerance   . Dyslipidemia   . GERD (gastroesophageal reflux disease)   . Hypertension   . INTERSTITIAL LUNG DISEASE 03/16/2010  . PSORIASIS 03/16/2010   Curtis [name], OTR/L,CBIS  [phone]  02/11/2017, 9:00 AM OCCUPATIONAL THERAPY DISCHARGE SUMMARY  Visits from Start of Care: 7  Current functional level related to goals / functional outcomes: Patient states he is doing quite well.  He feels he can do everything he wants to do functionally and requests dc via phone call on 02/17/17.   Remaining deficits: n/a   Education / Equipment: See above  Plan: Patient agrees to discharge.  Patient goals were met. Patient is being discharged due to meeting the stated rehab goals.  ?????       Curtis [name], [place], OTR/L [facility] [address] [place], [place], [address] Phone: [phone]   Fax:  [phone]  Name: Curtis [name] MRN: [id number] Date of Birth: 01-06-1946

## 2017-02-14 ENCOUNTER — Ambulatory Visit (HOSPITAL_COMMUNITY): Payer: PPO

## 2017-02-14 ENCOUNTER — Telehealth (HOSPITAL_COMMUNITY): Payer: Self-pay | Admitting: Family Medicine

## 2017-02-14 NOTE — Telephone Encounter (Signed)
2/8/1wife left a message to cancel but no reason was [place]

## 2017-02-17 ENCOUNTER — Telehealth (HOSPITAL_COMMUNITY): Payer: Self-pay | Admitting: Specialist

## 2017-02-17 ENCOUNTER — Ambulatory Visit (HOSPITAL_COMMUNITY): Payer: PPO | Admitting: Specialist

## 2017-02-17 NOTE — Telephone Encounter (Signed)
Patient called to cancel his visit.  He stated he was doing great and feels he doesn't need to complete any further therapy.  We will dc from skilled OT intervention this date. Vangie Bicker, [place], OTR/L [phone]

## 2017-02-19 ENCOUNTER — Encounter (INDEPENDENT_AMBULATORY_CARE_PROVIDER_SITE_OTHER): Payer: Self-pay | Admitting: Orthopaedic Surgery

## 2017-02-19 ENCOUNTER — Ambulatory Visit (INDEPENDENT_AMBULATORY_CARE_PROVIDER_SITE_OTHER): Payer: PPO | Admitting: Orthopaedic Surgery

## 2017-02-19 VITALS — BP 107/69 | HR 67 | Resp 14 | Ht 69.0 in | Wt 210.0 lb

## 2017-02-19 DIAGNOSIS — M25511 Pain in right shoulder: Secondary | ICD-10-CM

## 2017-02-19 DIAGNOSIS — G8929 Other chronic pain: Secondary | ICD-10-CM

## 2017-02-19 NOTE — Progress Notes (Signed)
Office Visit Note   Patient: Curtis [name]           Date of Birth: September 14, 1945           MRN: [id number] Visit Date: 02/19/2017              Requested by: Kathyrn Drown, MD [place],  [id number] PCP: Kathyrn Drown, MD   Assessment & Plan: Visit Diagnoses:  1. Chronic right shoulder pain     Plan:  A half weeks status post right shoulder arthroscopic SCD, DCR manipulation for adhesive capsulitis. Doing well and very happy with his present course. We'll finish his exercise program at home and see him back in a month  Follow-Up Instructions: Return in about 1 month (around 03/19/2017).   Orders:  No orders of the defined types were placed in this encounter.  No orders of the defined types were placed in this encounter.     Procedures: No procedures performed   Clinical Data: No additional findings.   Subjective: Chief Complaint  Patient presents with  . Right Shoulder - Routine Post Op    Mr. Curtis [name] is a 44 y o S/P 9 1/2 weeks Right shoulder arthroscopy.   Doing very well at present and happy with his progress.. Able to scratch the middle of his back and raise his arm almost always overstay which she could not do preoperatively. Motion is very quick and heis not having any appreciable pain. No numbness or tingling  HPI  Review of Systems  Constitutional: Negative for fatigue.  HENT: Positive for hearing loss and tinnitus.   Respiratory: Negative for apnea, chest tightness and shortness of breath.   Cardiovascular: Negative for chest pain, palpitations and leg swelling.  Gastrointestinal: Negative for blood in stool, constipation and diarrhea.  Genitourinary: Negative for difficulty urinating.  Musculoskeletal: Positive for back pain and neck stiffness. Negative for arthralgias, joint swelling, myalgias and neck pain.  Neurological: Negative for weakness, numbness and headaches.  Hematological: Does not bruise/bleed easily.    Psychiatric/Behavioral: Negative for sleep disturbance. The patient is not nervous/anxious.      Objective: Vital Signs: BP 107/69   Pulse 67   Resp 14   Ht 5\' 9"  (1.753 m)   Wt 210 lb (95.3 kg)   BMI 31.01 kg/m   Physical Exam  Ortho Exam awake alert and oriented 3. Comfortable. Very pleasant and in no pain. Quick overhead motion of right shoulder but still lacks about 25 full overhead motion. Is not a functional loss. Able to scratch the lower part of his back. No impingement. Negative empty can testing. Good strength. Biceps intact. Skin intact. Arthroscopic portals to feel without problem  Specialty Comments:  No specialty comments available.  Imaging: No results found.   PMFS History: Patient Active Problem List   Diagnosis Date Noted  . Impingement syndrome of right shoulder 12/12/2016  . AC (acromioclavicular) arthritis 12/12/2016  . Partial tear of right rotator cuff 12/12/2016  . Chronic respiratory failure ([place]) 04/26/2016  . Hyperbilirubinemia 04/26/2016  . H/O asbestos exposure 03/15/2016  . Persistent atrial fibrillation ([place]) 02/19/2016  . Atrial fibrillation, persistent ([place]) 02/19/2016  . IPF (idiopathic pulmonary fibrosis) ([place]) 02/09/2016  . Incidental lung nodule, greater than or equal to 2mm 02/09/2016  . Bell's palsy 08/03/2015  . Insomnia 06/08/2015  . Right trigeminal neuralgia 04/01/2014  . Chronic back pain 03/01/2014  . Elevated transaminase level 06/25/2013  . Obesity (BMI 30-39.9) 09/09/2012  .  Chronic neck pain 09/09/2012  . Symptomatic cholelithiasis 04/14/2012  . Chest pain   . Statin intolerance   . Dyslipidemia   . GERD (gastroesophageal reflux disease)   . Hypertension   . INTERSTITIAL LUNG DISEASE 03/16/2010  . PSORIASIS 03/16/2010   Past Medical History:  Diagnosis Date  . Arthritis   . Asthma   . Atrial fibrillation ([place])   . Cataract   . Cervical spondylosis   . Chest pain    Emergency room October 24, 2011, no MI   //   Nuclear, October, 2013, adenosine, EF 61%, no scar or ischemia  . Dyslipidemia    Statin intolerant  . Dyspnea   . GERD (gastroesophageal reflux disease)   . Glaucoma   . Hearing loss   . Hypertension   . Hypertension   . Idiopathic pulmonary fibrosis ([place])   . Interstitial pulmonary fibrosis ([place]) 2012  . Left shoulder pain 2011  . Myocardial infarction ([place])   . Psoriasis   . Pulmonary fibrosis ([place])   . Statin intolerance     Family History  Problem Relation Age of Onset  . Hypertension Mother   . Congestive Heart Failure Mother   . COPD Mother   . Stroke Father   . Cancer Sister        breast    Past Surgical History:  Procedure Laterality Date  . BRAIN SURGERY    . CARDIOVERSION N/A 12/13/2015   Procedure: CARDIOVERSION;  Surgeon: Josue Hector, MD;  Location: [facility];  Service: Cardiovascular;  Laterality: N/A;  . CATARACT EXTRACTION W/PHACO Right 06/11/2012   Procedure: CATARACT EXTRACTION PHACO AND INTRAOCULAR LENS PLACEMENT ([place]);  Surgeon: Tonny Branch, MD;  Location: [facility];  Service: Ophthalmology;  Laterality: Right;  CDE:  14.43  . CATARACT EXTRACTION W/PHACO Left 06/29/2012   Procedure: CATARACT EXTRACTION PHACO AND INTRAOCULAR LENS PLACEMENT ([facility]);  Surgeon: Tonny Branch, MD;  Location: [facility];  Service: Ophthalmology;  Laterality: Left;  CDE:14.11  . CHOLECYSTECTOMY  05/08/2012  . CHOLECYSTECTOMY N/A 05/08/2012   Procedure: LAPAROSCOPIC CHOLECYSTECTOMY;  Surgeon: Harl Bowie, MD;  Location: [facility];  Service: General;  Laterality: N/A;  . COLONOSCOPY    . LIVER BIOPSY N/A 05/08/2012   Procedure: LIVER BIOPSY;  Surgeon: Harl Bowie, MD;  Location: [facility];  Service: General;  Laterality: N/A;  . NECK SURGERY     cervical disc with cliip  . SHOULDER ARTHROSCOPY WITH DISTAL CLAVICLE RESECTION Right 12/12/2016   Procedure: RIGHT SHOULDER ARTHROSCOPY, SUBACROMIAL DECOMPRESSION, DISTAL CLAVICLE RESECTION, WITH POSSIBLE MINI OPEN ROTATOR CUFF REPAIR;   Surgeon: [name], MD;  Location: [facility];  Service: Orthopedics;  Laterality: Right;   Social History   Occupational History  . Not on file  Tobacco Use  . Smoking status: Former Smoker    Packs/day: 0.50    Years: 52.00    Pack years: 26.00    Types: Cigarettes    Last attempt to quit: 09/08/2003    Years since quitting: 13.4  . Smokeless tobacco: Never Used  Substance and Sexual Activity  . Alcohol use: No    Comment: quit drinking 2004  . Drug use: No  . Sexual activity: Yes    Birth control/protection: None

## 2017-02-20 ENCOUNTER — Ambulatory Visit (INDEPENDENT_AMBULATORY_CARE_PROVIDER_SITE_OTHER): Payer: PPO | Admitting: Family Medicine

## 2017-02-20 ENCOUNTER — Ambulatory Visit (HOSPITAL_COMMUNITY): Payer: PPO

## 2017-02-20 ENCOUNTER — Encounter: Payer: Self-pay | Admitting: Family Medicine

## 2017-02-20 VITALS — BP 98/68 | Ht 72.0 in | Wt 222.0 lb

## 2017-02-20 DIAGNOSIS — M545 Low back pain, unspecified: Secondary | ICD-10-CM

## 2017-02-20 DIAGNOSIS — G8929 Other chronic pain: Secondary | ICD-10-CM

## 2017-02-20 DIAGNOSIS — Z79891 Long term (current) use of opiate analgesic: Secondary | ICD-10-CM | POA: Diagnosis not present

## 2017-02-20 DIAGNOSIS — I10 Essential (primary) hypertension: Secondary | ICD-10-CM | POA: Diagnosis not present

## 2017-02-20 DIAGNOSIS — I481 Persistent atrial fibrillation: Secondary | ICD-10-CM | POA: Diagnosis not present

## 2017-02-20 DIAGNOSIS — I4819 Other persistent atrial fibrillation: Secondary | ICD-10-CM

## 2017-02-20 MED ORDER — HYDROCODONE-ACETAMINOPHEN 5-325 MG PO TABS: ORAL_TABLET | ORAL | 0 refills | Status: DC

## 2017-02-20 MED ORDER — ALPRAZOLAM 0.5 MG PO TABS
ORAL_TABLET | ORAL | 0 refills | Status: DC
Start: 2017-02-20 — End: 2017-04-21

## 2017-02-20 MED ORDER — ALPRAZOLAM 0.5 MG PO TABS: ORAL_TABLET | ORAL | 0 refills | Status: DC

## 2017-02-20 NOTE — Progress Notes (Signed)
Subjective:    Patient ID: Curtis [name], male    DOB: 1945/05/22, 72 y.o.   MRN: [id number]  HPI    Patient is here for Htn follow up and is eating as healthy as he can and gets some exercise. He is on Diltiazem 120 mg one daily. This patient was seen today for chronic pain  The medication list was reviewed and updated.   -Compliance with medication: He states he takes his medicine on a regular basis does not abuse it does not cause drowsiness  - Number patient states they take daily: Some days he takes 2 some days 3 some days for  -when was the last dose patient took?  Took medication yesterday  The patient was advised the importance of maintaining medication and not using illegal substances with these.  Here for refills and follow up  The patient was educated that we can provide 3 monthly scripts for their medication, it is their responsibility to follow the instructions.  Side effects or complications from medications: Denies any side effects states bowels move okay does not cause drowsiness  Patient is aware that pain medications are meant to minimize the severity of the pain to allow their pain levels to improve to allow for better function. They are aware of that pain medications cannot totally remove their pain.  Due for UDT ( at least once per year) : He is due today    He has other chronic health issues including pulmonary which is followed by specialist  Patient occasionally uses Xanax when he is exceptionally stressed but he understands never to take it with the pain medicine  Patient does have mild insomnia but we discussed how it is not feasible to use Ambien with it being on all his other medicines  Review of Systems  Constitutional: Negative for activity change, fatigue and fever.  HENT: Negative for congestion and rhinorrhea.   Respiratory: Negative for cough and shortness of breath.   Cardiovascular: Negative for chest pain and leg swelling.    Gastrointestinal: Negative for abdominal pain, diarrhea and nausea.  Genitourinary: Negative for dysuria and hematuria.  Neurological: Negative for weakness and headaches.  Psychiatric/Behavioral: Negative for behavioral problems.       Objective:   Physical Exam  Constitutional: He appears well-nourished. No distress.  HENT:  Head: Normocephalic and atraumatic.  Eyes: Right eye exhibits no discharge. Left eye exhibits no discharge.  Neck: No tracheal deviation present.  Cardiovascular: Normal rate and normal heart sounds.  No murmur heard. Pulmonary/Chest: Effort normal and breath sounds normal. No respiratory distress. He has no wheezes.  Musculoskeletal: He exhibits no edema.  Lymphadenopathy:    He has no cervical adenopathy.  Neurological: He is alert.  Skin: Skin is warm. No rash noted.  Psychiatric: His behavior is normal.  Vitals reviewed.  Atrial fibrillation stable tolerating medicine well Blood pressure stable       Assessment & Plan:  The patient was seen today as part of a comprehensive visit regarding pain control. Patient's compliance with the medication as well as discussion regarding effectiveness was completed. Prescriptions were written. Patient was advised to follow-up in 3 months. The patient was assessed for any signs of severe side effects. The patient was advised to take the medicine as directed and to report to [place] if any side effect issues. Time was spent today discussing pain contract with the patient Urine drug screen today 3 scripts written He uses pain medicine anywhere from 2-4 times per day  105 tablets should last him 30 days Follow-up 30 days Rest of his health is stable he follows up with pulmonary specialist later this spring

## 2017-02-26 ENCOUNTER — Telehealth: Payer: Self-pay | Admitting: Internal Medicine

## 2017-02-26 LAB — TOXASSURE SELECT 13 (MW), URINE

## 2017-02-26 LAB — SPECIMEN STATUS REPORT

## 2017-02-26 NOTE — Telephone Encounter (Signed)
Called and spoke with Abigail Butts regarding pt who was wanting to know if we have recent ABG results on pt.  Stated to her pt has not been seen at our office since 09/17/16 and I am not seeing any results of ABG being done on pt. Stated to her that pt was supposed to return 3 month from last OV but has not scheduled by pt.  Abigail Butts expressed understanding. Nothing further needed at this current time.

## 2017-02-28 ENCOUNTER — Telehealth: Payer: Self-pay | Admitting: Family Medicine

## 2017-02-28 NOTE — Telephone Encounter (Signed)
In this particular situation it is going to be complex to get him a light weight oxygen carrier they are asking for a blood gas or oxygen testing with exercise/walking.  I believe the patient's best chance to get this approved would be to schedule appointment with his pulmonologist Dr.Ramaswamy and bring this form with him so they can do the proper testing documentation to give him the best chance of getting this approved (see previous phone message)-therefore notify patient to set himself up an appointment with pulmonology he should come by to pick up this form and bring it with him when he sees pulmonology if the patient would prefer this form being mailed to him that would be fine

## 2017-02-28 NOTE — Telephone Encounter (Signed)
Left message to return call 

## 2017-02-28 NOTE — Telephone Encounter (Signed)
Discussed with pt. Pt verbalized understanding. He states he will call and make appt with specialist. Form for oxygen mailed to pt to take with him to his appointment.

## 2017-03-06 ENCOUNTER — Telehealth: Payer: Self-pay | Admitting: Internal Medicine

## 2017-03-06 DIAGNOSIS — J8410 Pulmonary fibrosis, unspecified: Secondary | ICD-10-CM | POA: Diagnosis not present

## 2017-03-06 NOTE — Progress Notes (Signed)
Electrophysiology Office Note   Date:  03/07/2017   ID:  Curtis [name], DOB 25-Sep-1945, MRN [id number]  PCP:  Curtis Drown, MD  Cardiologist:  Curtis [name] Primary Electrophysiologist:  Curtis Schnelle Meredith Leeds, MD    Chief Complaint  Patient presents with  . Follow-up    Persistent Afib     History of Present Illness: Curtis [name] is a 72 y.o. male who is being seen today for the evaluation of atrial fibillation at the request of Luking, Elayne Snare, MD. Presenting today for electrophysiology evaluation. He has a history of persistent atrial fibrillation status post Tikosyn loading, hypertension, hyperlipidemia, COPD and interstitial lung disease. His Tikosyn load was on 02/22/16.  He has done well. He was previously havin issues with fatigue, weakness, anorexia which he thought was due to his potassium, magnesium, and Tikosyn. Stopped those prior to his last visit.  Today, denies symptoms of palpitations, chest pain, shortness of breath, orthopnea, PND, lower extremity edema, claudication, dizziness, presyncope, syncope, bleeding, or neurologic sequela. The patient is tolerating medications without difficulties.  He has no major complaints of heart rhythm abnormalities.  He is noted no further atrial fibrillation.  He recently had right shoulder surgery on his rotator cuff and he is working to rehab that.   Past Medical History:  Diagnosis Date  . Arthritis   . Asthma   . Atrial fibrillation ([place])   . Cataract   . Cervical spondylosis   . Chest pain    Emergency room October 24, 2011, no MI  //   Nuclear, October, 2013, adenosine, EF 61%, no scar or ischemia  . Dyslipidemia    Statin intolerant  . Dyspnea   . GERD (gastroesophageal reflux disease)   . Glaucoma   . Hearing loss   . Hypertension   . Hypertension   . Idiopathic pulmonary fibrosis ([place])   . Interstitial pulmonary fibrosis ([place]) 2012  . Left shoulder pain 2011  . Myocardial infarction ([place])   . Psoriasis     . Pulmonary fibrosis ([place])   . Statin intolerance    Past Surgical History:  Procedure Laterality Date  . BRAIN SURGERY    . CARDIOVERSION N/A 12/13/2015   Procedure: CARDIOVERSION;  Surgeon: Curtis Hector, MD;  Location: [facility];  Service: Cardiovascular;  Laterality: N/A;  . CATARACT EXTRACTION W/PHACO Right 06/11/2012   Procedure: CATARACT EXTRACTION PHACO AND INTRAOCULAR LENS PLACEMENT ([place]);  Surgeon: Curtis Branch, MD;  [facility];  Service: Ophthalmology;  Laterality: Right;  CDE:  14.43  . CATARACT EXTRACTION W/PHACO Left 06/29/2012   Procedure: CATARACT EXTRACTION PHACO AND INTRAOCULAR LENS PLACEMENT ([facility]);  Surgeon: Curtis Branch, MD;  [facility];  Service: Ophthalmology;  Laterality: Left;  CDE:14.11  . CHOLECYSTECTOMY  05/08/2012  . CHOLECYSTECTOMY N/A 05/08/2012   Procedure: LAPAROSCOPIC CHOLECYSTECTOMY;  Surgeon: Curtis Bowie, MD;  Location: [facility];  Service: General;  Laterality: N/A;  . COLONOSCOPY    . LIVER BIOPSY N/A 05/08/2012   Procedure: LIVER BIOPSY;  Surgeon: Curtis Bowie, MD;  Location: [facility];  Service: General;  Laterality: N/A;  . NECK SURGERY     cervical disc with cliip  . SHOULDER ARTHROSCOPY WITH DISTAL CLAVICLE RESECTION Right 12/12/2016   Procedure: RIGHT SHOULDER ARTHROSCOPY, SUBACROMIAL DECOMPRESSION, DISTAL CLAVICLE RESECTION, WITH POSSIBLE MINI OPEN ROTATOR CUFF REPAIR;  Surgeon: Curtis Balding, MD;  Location: [facility];  Service: Orthopedics;  Laterality: Right;     Current Outpatient Medications  Medication Sig Dispense Refill  .  albuterol (PROVENTIL HFA;VENTOLIN HFA) 108 (90 Base) MCG/ACT inhaler Inhale 2 puffs into the lungs every 6 (six) hours as needed for wheezing. 1 Inhaler 2  . ALPRAZolam (XANAX) 0.5 MG tablet 1/2 to 1 bid prn 20 tablet 0  . apixaban (ELIQUIS) 5 MG TABS tablet Take 1 tablet (5 mg total) by mouth 2 (two) times daily. 60 tablet 6  . diltiazem (CARTIA XT) 120 MG 24 hr capsule TAKE 1 CAPSULE(120 MG) BY MOUTH DAILY  30 capsule 7  . fexofenadine (ALLEGRA) 180 MG tablet Take 180 mg by mouth daily as needed for allergies or rhinitis.    . fluticasone (FLONASE) 50 MCG/ACT nasal spray Place 2 sprays into both nostrils daily. (Patient taking differently: Place 1 spray into both nostrils daily. ) 16 g 5  . HYDROcodone-acetaminophen (NORCO) 5-325 MG tablet One pill 3 to 4 times a day to last 30 days 105 tablet 0  . omeprazole (PRILOSEC) 40 MG capsule Take 40 mg by mouth daily.     . OXYGEN Inhale 2.25 L into the lungs as needed (for shortness of breath).    Curtis [name] Glycol-Propyl Glycol (SYSTANE ULTRA OP) Place 1 drop into both eyes daily.     No current facility-administered medications for this visit.     Allergies:   Pirfenidone; Acyclovir and related; Oxycodone; Pravastatin; Simvastatin; Prednisone; and Tegretol [carbamazepine]   Social History:  The patient  reports that he quit smoking about 13 years ago. His smoking use included cigarettes. He has a 26.00 pack-year smoking history. he has never used smokeless tobacco. He reports that he does not drink alcohol or use drugs.   Family History:  The patient's family history includes COPD in his mother; Cancer in his sister; Congestive Heart Failure in his mother; Hypertension in his mother; Stroke in his father.  ROS:  Please see the history of present illness.   Otherwise, review of systems is positive for hip and knee pain.   All other systems are reviewed and negative.   PHYSICAL EXAM: VS:  BP 110/70   Pulse 71   Ht 6' 1.5" (1.867 m)   Wt 223 lb (101.2 kg)   BMI 29.02 kg/m  , BMI Body mass index is 29.02 kg/m. GEN: Well nourished, well developed, in no acute distress  HEENT: normal  Neck: no JVD, carotid bruits, or masses Cardiac: iRRR; no murmurs, rubs, or gallops,no edema  Respiratory:  clear to auscultation bilaterally, normal work of breathing GI: soft, nontender, nondistended, + BS MS: no deformity or atrophy  Skin: warm and dry Neuro:   Strength and sensation are intact Psych: euthymic mood, full affect  EKG:  EKG is ordered today. Personal review of the ekg ordered shows sinus rhythm, APCs, PVCs, rate 71   Recent Labs: 05/26/2016: B Natriuretic Peptide 28.4 06/04/2016: Magnesium 2.1 06/18/2016: ALT 13 08/22/2016: TSH 1.430 12/12/2016: BUN 6; Creatinine, Ser 0.87; Hemoglobin 13.1; Platelets 108; Potassium 3.8; Sodium 137    Lipid Panel     Component Value Date/Time   CHOL 128 11/20/2016 0901   TRIG 128 11/20/2016 0901   HDL 21 (L) 11/20/2016 0901   CHOLHDL 6.1 (H) 11/20/2016 0901   CHOLHDL 4.4 06/13/2015 0728   VLDL 20 06/13/2015 0728   LDLCALC 81 11/20/2016 0901     Wt Readings from Last 3 Encounters:  03/07/17 223 lb (101.2 kg)  02/20/17 222 lb 0.6 oz (100.7 kg)  02/19/17 210 lb (95.3 kg)      Other studies Reviewed: Additional studies/  records that were reviewed today include: TTE 11/23/15  Review of the above records today demonstrates:  -Left ventricle: The cavity size was normal. Wall thickness was   normal. The estimated ejection fraction was 55%. Although no   diagnostic regional wall motion abnormality was identified, this   possibility cannot be completely excluded on the basis of this   study. - Aortic valve: Moderately calcified annulus. Trileaflet; mildly   calcified leaflets. Cusp separation was reduced. Aortic valve was   not completely interrogated. There looks to be mild aortic   stenosis. Valve area by planimetry 1.8 cm^2. - Mitral valve: Calcified annulus. Mildly thickened, mildly   calcified leaflets . There was mild regurgitation. - Left atrium: The atrium was moderately dilated. - Right atrium: The atrium was mildly dilated. Central venous   pressure (est): 3 mm Hg. - Atrial septum: No defect or patent foramen ovale was identified. - Tricuspid valve: There was trivial regurgitation. - Pulmonary arteries: PA peak pressure: 30 mm Hg (S). - Pericardium, extracardiac: There was no  pericardial effusion.   SPECT 02/05/16  There was no ST segment deviation noted during stress.  Defect 1: There is a small defect of mild severity present in the basal inferolateral and mid inferolateral location.  This is a low risk study.   Low risk stress nuclear study with a small fixed inferolateral defect. Ischemia is not seen.  ASSESSMENT AND PLAN:  1.  Persistent atrial fibrillation: Eliquis.  He was on Tigas in the past but was stopped due to anorexia.  He is remained in sinus rhythm.  No changes at this time.  Due to lung disease, would prefer to avoid amiodarone.  This patients CHA2DS2-VASc Score and unadjusted Ischemic Stroke Rate (% per year) is equal to 2.2 % stroke rate/year from a score of 2  Above score calculated as 1 point each if present [CHF, HTN, DM, Vascular=MI/PAD/Aortic Plaque, Age if 65-74, or Male] Above score calculated as 2 points each if present [Age > 75, or Stroke/TIA/TE]   2. Hypertension: Well-controlled today.  No changes.  Current medicines are reviewed at length with the patient today.   The patient does not have concerns regarding his medicines.  The following changes were made today: None  Labs/ tests ordered today include:  Orders Placed This Encounter  Procedures  . EKG 12-Lead     Disposition:   FU with Charnell Peplinski 6 months  Signed, Eymi Lipuma Meredith Leeds, MD  03/07/2017 10:31 AM     [facility] [address] [place]  [address] [phone] (office) [phone] (fax)

## 2017-03-06 NOTE — Telephone Encounter (Signed)
I placed a call this morning Mar 06 2017 to Mr. Curtis [name], he is due for a 12 month follow up research appointment with the [facility] staff. Patient did not answer so voice mail was left and he was asked to return call to schedule a time to return to the office for blood draw and patient questionnaires.  [name] BS CRC

## 2017-03-07 ENCOUNTER — Encounter: Payer: Self-pay | Admitting: Cardiology

## 2017-03-07 ENCOUNTER — Ambulatory Visit: Payer: PPO | Admitting: Cardiology

## 2017-03-07 VITALS — BP 110/70 | HR 71 | Ht 73.5 in | Wt 223.0 lb

## 2017-03-07 DIAGNOSIS — I10 Essential (primary) hypertension: Secondary | ICD-10-CM

## 2017-03-07 DIAGNOSIS — I481 Persistent atrial fibrillation: Secondary | ICD-10-CM

## 2017-03-07 DIAGNOSIS — I4819 Other persistent atrial fibrillation: Secondary | ICD-10-CM

## 2017-03-07 NOTE — Patient Instructions (Signed)
Medication Instructions:  Your physician recommends that you continue on your current medications as directed. Please refer to the Current Medication list given to you today.  If you need a refill on your cardiac medications before your next appointment, please call your pharmacy.   Labwork: None ordered  Testing/Procedures: None ordered  Follow-Up: Your physician wants you to follow-up in: 6 months with Dr. Camnitz.  You will receive a reminder letter in the mail two months in advance. If you don't receive a letter, please call our office to schedule the follow-up appointment.  Thank you for choosing [facility]!!   Jenny Lai, RN [phone]         

## 2017-03-19 ENCOUNTER — Ambulatory Visit (INDEPENDENT_AMBULATORY_CARE_PROVIDER_SITE_OTHER): Payer: PPO | Admitting: Orthopaedic Surgery

## 2017-04-04 DIAGNOSIS — J8410 Pulmonary fibrosis, unspecified: Secondary | ICD-10-CM | POA: Diagnosis not present

## 2017-04-21 ENCOUNTER — Other Ambulatory Visit: Payer: Self-pay | Admitting: Family Medicine

## 2017-04-22 NOTE — Telephone Encounter (Signed)
May have 1 refill, please put on the prescription not to take with hydrocodone

## 2017-05-05 DIAGNOSIS — J8410 Pulmonary fibrosis, unspecified: Secondary | ICD-10-CM | POA: Diagnosis not present

## 2017-05-07 ENCOUNTER — Telehealth: Payer: Self-pay | Admitting: Internal Medicine

## 2017-05-07 NOTE — Telephone Encounter (Signed)
PI OVERSIGHT ATTESTATION  I the Principal Investigator (PI) for the above mentioned study attest that I reviewed the above mentioned clinical research coordinator notes on research subject  Curtis [name]  born 18-Dec-1945 . I  agree with the findings mentioned above. IN adddition I will have the [facility] team reach out to him to get him in for regular IPF visit   Dr. Brand Males, M.D., F.C.C.P., ACRP-CPI Pulmonary and Critical Care Medicine Principal Investigator & Staff Physician [facility] Pager: [phone], If no answer or between  15:00h - 7:00h: call [phone]  05/07/2017 11:37 AM

## 2017-05-07 NOTE — Telephone Encounter (Signed)
Late Entry  IPF PRO Registry Purpose: To collect data and biological samples that will support future research studies.  Registry will describe current approaches to diagnosis and treatment of IPF, analyze participant characteristics to describe the natural history of the disease, assess quality of life, describe participants interactions with the health care system, describe IPF treatment practices across multiple institutions, and utilize biological samples linked to well characterized IPF participants to identify disease biomarkers.    Clinical Research Coordinator note: This visit for Subject Curtis [name] with DOB: Feb 23, 1945 on 05/05/2017 for the above protocol is Visit/Encounter # 12 month follow up  and is for the purpose of Research . The consent for this encounter is under Protocol Version [id number] and  is currently IRB approved.  In this encounter that occurred 29APR2019, the subject had their 12 month IPF follow up which will include abstracting all records pertaining to the subjects diagnosis as required by the above mentioned protocol. Despite Multiple attempts by multiple members of our staff to contact the patient by means of Phone calls and a certified letter sent to the patient, the subject has yet to respond to attempts to have him return for his follow up visit.The patient is not under any obligation to be present for this encounter as stated in the protocol. And the patient has not expressed to [place] his desire to withdraw from participation of trial, so as stated in the above protocol, patient records are to be collected at approximately 6 month intervals and this was done today. Since patient was not present Blood collection and patient reported outcomes questionnaire was not completed. All data collected was entered into [facility] and paper source verification will be filed in [employer] paper source binder. For further information pertaining to this encounter refer to Subject paper  source binder.   Signed by  T. Early Chars BS, [place], [place] 9:24 AM 05/07/2017

## 2017-05-07 NOTE — Telephone Encounter (Signed)
yEmily  Not seen him for IPF in long time. Please schedule a ILD clinic followup; first availabl;e  Dr. Brand Males, M.D., [facility].C.P Pulmonary and Critical Care Medicine Staff Physician, [place] Director - Interstitial Lung Disease  Program  Pulmonary [place] [facility] [place], [place], [address]  Pager: [phone], If no answer or between  15:00h - 7:00h: call [phone] Telephone: [phone]

## 2017-05-07 NOTE — Telephone Encounter (Signed)
First avail appt in ILD clinic is Tues. 6/11  Attempted to call pt to see if I could get him scheduled an appt in ILD clinic with MR.    Left a detailed message for pt to return call so we could get a f/u appt scheduled for him to see MR

## 2017-05-13 ENCOUNTER — Encounter: Payer: Self-pay | Admitting: Family Medicine

## 2017-05-13 ENCOUNTER — Ambulatory Visit (INDEPENDENT_AMBULATORY_CARE_PROVIDER_SITE_OTHER): Payer: PPO | Admitting: Family Medicine

## 2017-05-13 ENCOUNTER — Telehealth: Payer: Self-pay | Admitting: Family Medicine

## 2017-05-13 VITALS — BP 122/86 | Temp 97.8°F | Ht 72.0 in | Wt 227.4 lb

## 2017-05-13 DIAGNOSIS — J84112 Idiopathic pulmonary fibrosis: Secondary | ICD-10-CM | POA: Diagnosis not present

## 2017-05-13 DIAGNOSIS — I10 Essential (primary) hypertension: Secondary | ICD-10-CM | POA: Diagnosis not present

## 2017-05-13 DIAGNOSIS — M545 Low back pain: Secondary | ICD-10-CM

## 2017-05-13 DIAGNOSIS — E785 Hyperlipidemia, unspecified: Secondary | ICD-10-CM | POA: Diagnosis not present

## 2017-05-13 DIAGNOSIS — J8410 Pulmonary fibrosis, unspecified: Secondary | ICD-10-CM

## 2017-05-13 DIAGNOSIS — J019 Acute sinusitis, unspecified: Secondary | ICD-10-CM | POA: Diagnosis not present

## 2017-05-13 DIAGNOSIS — G8929 Other chronic pain: Secondary | ICD-10-CM

## 2017-05-13 DIAGNOSIS — Z125 Encounter for screening for malignant neoplasm of prostate: Secondary | ICD-10-CM

## 2017-05-13 MED ORDER — HYDROCODONE-ACETAMINOPHEN 5-325 MG PO TABS: ORAL_TABLET | ORAL | 0 refills | Status: DC

## 2017-05-13 MED ORDER — CEFDINIR 300 MG PO CAPS: 300.0000 mg | ORAL_CAPSULE | Freq: Two times a day (BID) | ORAL | 0 refills | Status: DC

## 2017-05-13 MED ORDER — PREDNISONE 20 MG PO TABS: ORAL_TABLET | ORAL | 0 refills | Status: DC

## 2017-05-13 NOTE — Addendum Note (Signed)
Addended by: Dairl Ponder on: 05/13/2017 01:45 PM   Modules accepted: Orders

## 2017-05-13 NOTE — Progress Notes (Signed)
Referral ordered in EPIC. 

## 2017-05-13 NOTE — Progress Notes (Signed)
   Subjective:    Patient ID: Curtis [name], male    DOB: 14-Aug-1945, 72 y.o.   MRN: [id number]  Cough  This is a new problem. The current episode started in the past 7 days. Associated symptoms include nasal congestion, rhinorrhea and wheezing. Pertinent negatives include no chest pain, chills, ear pain or fever.  Patient does have some chronic lung disease Relates head congestion drainage coughing denies high fever chills sweats denies wheezing difficulty breathing This patient was seen today for chronic pain  The medication list was reviewed and updated.   -Compliance with medication: Relates compliance takes anywhere between 3 or 4/day  - Number patient states they take daily: Takes between 3 or 4/day  -when was the last dose patient took?  Took some earlier today  The patient was advised the importance of maintaining medication and not using illegal substances with these.  Here for refills and follow up  The patient was educated that we can provide 3 monthly scripts for their medication, it is their responsibility to follow the instructions.  Side effects or complications from medications: Denies any side effects does not cause drowsiness does help him with his pain  Patient is aware that pain medications are meant to minimize the severity of the pain to allow their pain levels to improve to allow for better function. They are aware of that pain medications cannot totally remove their pain.  Due for UDT ( at least once per year) : Later this year  Drug registry was checked        Review of Systems  Constitutional: Negative for activity change, chills and fever.  HENT: Positive for congestion and rhinorrhea. Negative for ear pain.   Eyes: Negative for discharge.  Respiratory: Positive for cough and wheezing.   Cardiovascular: Negative for chest pain.  Gastrointestinal: Negative for nausea and vomiting.  Musculoskeletal: Negative for arthralgias.         Objective:   Physical Exam  Constitutional: He appears well-developed.  HENT:  Head: Normocephalic.  Mouth/Throat: Oropharynx is clear and moist. No oropharyngeal exudate.  Neck: Normal range of motion.  Cardiovascular: Normal rate, regular rhythm and normal heart sounds.  No murmur heard. Pulmonary/Chest: Effort normal and breath sounds normal. He has no wheezes.  Lymphadenopathy:    He has no cervical adenopathy.  Neurological: He exhibits normal muscle tone.  Skin: Skin is warm and dry.  Nursing note and vitals reviewed.         Assessment & Plan:  Acute rhinosinusitis Interstitial pulmonary fibrosis Prednisone taper Albuterol as needed Antibiotics prescribed  The patient was seen in followup for chronic pain. A review over at their current pain status was discussed. Discussion was held regarding the importance of compliance with medication as well as pain medication contract. Discussion with the patient regarding the medication as it pertains to allowing for blunting of pain levels as well as improvement of function was completed. Questions regarding the pain management were answered. Importance of regular followup visits was discussed. Patient was informed that medication may cause drowsiness and should not be combined  with other medications/alcohol or street drugs. Patient was cautioned that drowsiness may impair ability to operate heavy machinery/vehicles/dangerous activities and should take this into consideration.  Drug registry checked 3 prescriptions written follow-up 3 months do lab work before next visit comprehensive physical next

## 2017-05-13 NOTE — Telephone Encounter (Signed)
FYI  No referral needed (will cancel referral)  Pt is existing with Dr. Chase Caller  Their office [facility] for pt 05/07/17 (see message in chart)  Called pt, explained that he needs to return call to pulmonary office, gave pt # to call - pt verbalized understanding

## 2017-05-19 ENCOUNTER — Telehealth: Payer: Self-pay | Admitting: Family Medicine

## 2017-05-19 MED ORDER — AMOXICILLIN-POT CLAVULANATE 875-125 MG PO TABS: 1.0000 | ORAL_TABLET | Freq: Two times a day (BID) | ORAL | 0 refills | Status: AC

## 2017-05-19 NOTE — Telephone Encounter (Signed)
Patient seen Dr. Nicki Reaper on 05/13/17.  He said he believes the medication that was prescribed is causing him back pain and diarrhea.  He stopped taking the Cefdinir yesterday.  He said it hasn't helped break any of the congestion up.  He would like something else called in.  [facility] on [address]

## 2017-05-19 NOTE — Telephone Encounter (Signed)
Pt.notified

## 2017-05-19 NOTE — Telephone Encounter (Signed)
Prescription sent electronically to pharmacy  Left message to return call 

## 2017-05-19 NOTE — Telephone Encounter (Signed)
Discontinue Cefdinir.  Recommend Augmentin 875 twice daily 10 days

## 2017-05-20 ENCOUNTER — Ambulatory Visit: Payer: PPO | Admitting: Family Medicine

## 2017-06-03 NOTE — Telephone Encounter (Signed)
Keep as is given fact PFT is salso scheduled

## 2017-06-03 NOTE — Telephone Encounter (Signed)
It appears that pt is already scheduled for a PFT and OV on 6/6- this is not a ILD clinic.  MR please advise if this ov will suffice.  Thanks!

## 2017-06-04 DIAGNOSIS — J8410 Pulmonary fibrosis, unspecified: Secondary | ICD-10-CM | POA: Diagnosis not present

## 2017-06-12 ENCOUNTER — Encounter: Payer: Self-pay | Admitting: Internal Medicine

## 2017-06-12 ENCOUNTER — Ambulatory Visit (INDEPENDENT_AMBULATORY_CARE_PROVIDER_SITE_OTHER): Payer: PPO | Admitting: Internal Medicine

## 2017-06-12 ENCOUNTER — Ambulatory Visit: Payer: PPO | Admitting: Internal Medicine

## 2017-06-12 VITALS — BP 124/88 | HR 64 | Ht 73.0 in | Wt 227.0 lb

## 2017-06-12 DIAGNOSIS — J84112 Idiopathic pulmonary fibrosis: Secondary | ICD-10-CM | POA: Diagnosis not present

## 2017-06-12 LAB — PULMONARY FUNCTION TEST
DL/VA % pred: 96 %
DL/VA: 4.59 ml/min/mmHg/L
DLCO UNC % PRED: 40 %
DLCO UNC: 14.87 ml/min/mmHg
FEF 25-75 Pre: 1.66 L/sec
FEF2575-%Pred-Pre: 64 %
FEV1-%Pred-Pre: 49 %
FEV1-Pre: 1.74 L
FEV1FVC-%Pred-Pre: 109 %
FEV6-%PRED-PRE: 47 %
FEV6-Pre: 2.17 L
FEV6FVC-%PRED-PRE: 106 %
FVC-%Pred-Pre: 45 %
FVC-Pre: 2.17 L
PRE FEV1/FVC RATIO: 80 %
Pre FEV6/FVC Ratio: 100 %

## 2017-06-12 NOTE — Patient Instructions (Addendum)
ICD-10-CM   1. IPF (idiopathic pulmonary fibrosis) (HCC) J84.112     Agree you have progressive disease; 7.6% loss of lung function In over a year; currently 45% normal capacity  You have gained weight due to diet issues  Respect you are not interested in approved anti-fibrotics due to gi side effects   Glad you are interested in research trials but only for agents that do not have GI side effects  plan  - esbriet -never again  - we discussed ofev for IPF but also has gi side effect; so will hold off - recommend being aggressive with  weight loss - walk up stairs to test for o2 desaturation -Reassess in the future for any pulmonary fibrosis trials that do not have GI side effects  - I looked up and you do not qualify for infusion trial FIbroGen that I talked about  - However, you likely wil qualify for an inhaler trial called GALECTO; but you are at borderline for it  - Recommended pulmonary rehabilitation at [facility] last visit - did you start it  Follow-up - in 2 months do Pre-bd spiro and dlco only. No lung volume or bd response. No post-bd spiro -Return to see Dr. Chase Caller in ILD clinic in 2 months or sooner if needed  - will call for research protocol from [facility] sooner if possible

## 2017-06-12 NOTE — Progress Notes (Signed)
Spirometry and Dlco done today. 

## 2017-06-12 NOTE — Progress Notes (Signed)
Subjective:     Patient ID: Curtis [name], male   DOB: [birth year], 72 y.o.   MRN: [id number]  HPI   @Patient  ID: Curtis [name], male    DOB: 08/24/1945, 72 y.o.   MRN: [id number]  Chief Complaint  Patient presents with  . Follow-up    IPF     IOV 02/09/2016  Chief Complaint  Patient presents with  . Pulmonary Consult    Referred by Dr. Wolfgang Phoenix for pulomnary fibrosis, Pt. states his breathing is ok, Pt. c/o of sob with exertion, slight cough, Denies chest tightness    72 year old male accompanied by his wife Joey Lierman. He has a diagnosis of only fibrosis with high pretest probability of idiopathic Pulmonray fibrosis being managed at [facility] by Dr. 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 History is gained from him, talking to his wife and review and summarization of the [employer]. He has had remote asbestos exposure while working in [employer]. 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 He also has a 26 pack smoking history which he quit in 2004. His pulmonary imaging has never showed pleural plaques. He's had insidious worsening of dyspnea for many years. His CT scans at [facility] are reported as possible UIP. He's also been maintained on low-dose prednisone which according to the review of [employer] notes in this has helped him. He is low antibody titer positive for ANA and MI 2 in 2014 . Diagnosis given was of IPF. In October 2017 follow-up visit to [facility] according to the patient and review of the charts show 11/01/2015 high-resolution CT chest showed progression of disease or lung disease compared to 2014 and change in the pattern from possible UIP per definitive UIP. In addition there is also lingula nodule that measured 8 mm up from 6 mm in 2014. Given the progression and the definite change in CT chest the diagnosis of definite of UIP/IPF was made and the patient was counseled about starting specific  anti-fibrotic therapy. He and his wife are fully aware of the 2 anti-fibrotic regimens of Pirfenidone (Esbriet) and Ofev. He says his Coumadin to starting one of these at our consult.   He also tells me the year 2017 he's had several setbacks with this helped including atrial fibrillation and he is now on anticoagulations wary of taking Ofev. He is also had Bell's palsy and an neurologic issue. Taking into account of all this and is visible obesity feels dyspnea is worsened because of lung disease.  He is also wondering how much of his pulmonary fibrosis is related to previous asbestos exposure.  In terms of disease severity: He uses oxygen with exertion. In 2013 FVC and DLCO 60% each. In January 2017 his FVC is 2.2 L/42% predicted with FEV1 of 1.6 L/42% predicted, TLC of 47% predicted and DLCO 14.2/55% predicted suggesting overall slow steady progression.  In terms of comorbidities nuclear medicine cardiac stress of 02/02/2016 was low risk  Diffuse parenchymal lung disease prob list in [facility] jan 2017 [facility] notes copied and pasted and summarized A. Possible UIP pattern by current ATS criteria -> changed to progression and definite UIP Oct 2017 B. Significant asbestos exposure in [employer]  C. 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 Subjective improvement with Advair; no air trapping seen on CT  E. No symptoms suggestive of CTD; labs show aldolase of 10.2 and weak positive MI-2.  F. TPMT level is 8.7 (low), indicating  he may not metabolize azathioprine effectively.  2. Exertional hypoxia, improved.  OV 03/15/2016  Chief Complaint  Patient presents with  . Follow-up    Pt states he is waiting on a form from [place] to start his Esbriet. Pt states his breathing is unchanged since last OV. Pt here after PFT.      Follow-up idiopathic pulmonary fibrosis also asbestos exposure but without pleural plaques  He presents with his son and? Daughter. He is here to discuss esbriet Start is  going. However he has run into problems with charity and paperwork. In addition apparently [place] did not disclose my last name insert resulted in confusion. Apparently they disclose only the first name. Overall he is stable.. Pulmonary function testing done today at [facility] but the result is not yet available. We discussed some of the remaining items such as a letter about asbestos exposure causing ILD, pulmonary fibrosis foundation support group and referred to pulmonary rehabilitation.     04/26/2016 Follow up: IPF /ER follow up  Pt returns for follow up from recent ER visit . Pt says he recently has been seen for acute abdominal pain and chest pain and nausea. ER workup was unrevealing for cardiac source . CXR showed chronic IPF . No acute process. Troponin /BNP nml . AST /ALT were nnl . T. Bili was elevated at 3.4.  Patient says about a week ago that he developed some significant nausea, lower abdominal pain and chest tightness. He had no vomiting or diarrhea. Patient recently started on Esbriet . He initially started on 1 capsule 3 times a day. On the second week he began 2 capsules 3 times a day. On the day of the increased dose titration patient abdominal symptoms began. Patient has held aspirin. Patient was seen by his primary care physician. He was felt to have possible diverticulitis. And started on Cipro and Flagyl.. Patient is on chronic narcotics and has constipation. CT abdomen and pelvis was done on 04/23/2015 that showed sigmoid diverticulosis without diverticulitis. Cirrhotic appearing liver . No mass noted.  It appears a ABD [place] is pending from PCP.   Patient presents today and says that he is feeling much better. Nausea has resolved. Stomach is feeling better and patient's appetite has returned.  Patient is very concerned that he has held his Esbriet. He felt that the short week that he was on this. His breathing improved substantially. He had less shortness of breath and  improved exercise tolerance.    OV 09/17/2016  Chief Complaint  Patient presents with  . Follow-up    Pt states his SOB is unchanged since last OV. Pt states he received an abx from PCP for a prod cough - pt states his cough has resovled. Pt denies CP/tightness and f/c/s.    Follow-up idiopathic pulmonary fibrosis  Around late March or early April 2018 he started Pirfenidone (Esbriet). He says that shortly after starting it he started feeling good but then he escalated the dose he started having GI side effects. In April 2018 he did have CT scan of the chest that did not show any diverticulitis. Nevertheless review of the chart shows he was treated as diverticulitis. He did have hyperbilirubinemia. With this his Pirfenidone (Esbriet) was stopped and his GI symptoms resolved. He is frustrated that Pirfenidone (Esbriet) caused the symptoms. At this point in time for his IPF given the significant GI side effects is not interested in [place]. Overall since then he's intentionally lost weight. His goal is  to get below 200 pounds. With the loss of weight he's feeling better. Shortness of breath is improved. He is not yet attending pulmonary rehabilitation. He is asking about innogen oxygen system for help with exertional hypoxemia although he recognizes that with weight loss this might not be an issue anymore. He is willing to have high dose flu shot is on Indocin basic supportive care for his IPF and this point   IMPRESSION: 1. Pattern progressive pulmonary fibrosis is suspicious for usual interstitial pneumonitis, despite lack of a Strong craniocaudal gradient. Fibrotic nonspecific interstitial pneumonitis is not excluded.There is a peripheral pattern of subpleural reticulation, traction bronchiectasis/bronchiolectasis, ground-glass and honeycombing, progressive from 08/15/2009 2. Aortic atherosclerosis (ICD10-170.0). Three-vessel involvement of the coronary arteries. 3.  Cirrhosis.   Electronically Signed   By: Lorin Picket M.D.   On: 09/11/2016 10:37       OV 06/12/2017  Chief Complaint  Patient presents with  . Follow-up    PFT done today, reports increased oxygen use.     Charlo , 72 y.o. , with dob 1945-11-25 and male ,Not Hispanic or Latino from [place] [address] - presents to ILD  clinic for IPF followup.  He is on basic supportive care after having significant side effects from pirfenidone.  He does not want to do nintedanib because of fear of GI side effects.  Since his last visit he reports progressive dyspnea.  [employer] work is becoming difficult.  Oxygen helps his symptoms but he uses the oxygen only inside the house because the machine is big.  He wants a portable oxygen.  We walked 285 feet x 3 laps in the office and he became only mildly short of breath but did not desaturate below 88%.  Then we took him up the stairs and made him walk 3 flights of stairs and he became very short of breath but did not desaturate below 96%.  He got very tachycardic and became dyspneic.  He is yet to attend pulmonary rehabilitation.  He is very interested in research trials because he absolutely does not want to do any approved anti-fibrotic's because of GI side effects.  He is also only interested in a research trial if he can\begin agent that has low risk of GI side effects.  He was trying to lose weight but now has gained 5 or 6 pounds because of his love for  biscuit and gravy     Results for NIKOLAI, WILCZAK (MRN [id number]) as of 06/12/2017 12:01  Ref. Range 03/15/2016 07:34 06/12/2017 10:32  FVC-Pre Latest Units: L 2.35 2.17 (7.65% decline)  FVC-%Pred-Pre Latest Units: % 48 45   Results for DUILIO, HERITAGE (MRN [id number]) as of 06/12/2017 12:01  Ref. Range 03/15/2016 07:34 06/12/2017 10:32  DLCO unc Latest Units: ml/min/mmHg 13.30 14.87  DLCO unc % pred Latest Units: % 36 40   Simple office walk 185 feet x  3 laps  goal with forehead probe 06/12/2017   O2 used Room air  Number laps completed 3  Comments about pace normal  Resting Pulse Ox/HR 98% and 65/min  Final Pulse Ox/HR 90% and 105/min  Desaturated </= 88% No but came close  Desaturated <= 3% points yes  Got Tachycardic >/= 90/min yes  Symptoms at end of test x  Miscellaneous comments X - repeated walk test on 3 lfights of stairs. Got very very dyspneic. No chest pain, Pulze ox 1005 -> 96% but HR 65 -> 115  has a past medical history of Arthritis, Asthma, Atrial fibrillation ([place]), Cataract, Cervical spondylosis, Chest pain, Dyslipidemia, Dyspnea, GERD (gastroesophageal reflux disease), Glaucoma, Hearing loss, Hypertension, Hypertension, Idiopathic pulmonary fibrosis ([place]), Interstitial pulmonary fibrosis ([place]) (2012), Left shoulder pain (2011), Myocardial infarction ([place]), Psoriasis, Pulmonary fibrosis ([place]), and Statin intolerance.   reports that he quit smoking about 13 years ago. His smoking use included cigarettes. He has a 26.00 pack-year smoking history. He has never used smokeless tobacco.  Past Surgical History:  Procedure Laterality Date  . BRAIN SURGERY    . CARDIOVERSION N/A 12/13/2015   Procedure: CARDIOVERSION;  Surgeon: Josue Hector, MD;  Location: [place];  Service: Cardiovascular;  Laterality: N/A;  . CATARACT EXTRACTION W/PHACO Right 06/11/2012   Procedure: CATARACT EXTRACTION PHACO AND INTRAOCULAR LENS PLACEMENT ([place]);  Surgeon: Tonny Branch, MD;  Location: [facility];  Service: Ophthalmology;  Laterality: Right;  CDE:  14.43  . CATARACT EXTRACTION W/PHACO Left 06/29/2012   Procedure: CATARACT EXTRACTION PHACO AND INTRAOCULAR LENS PLACEMENT ([facility]);  Surgeon: Tonny Branch, MD;  Location: [facility];  Service: Ophthalmology;  Laterality: Left;  CDE:14.11  . CHOLECYSTECTOMY  05/08/2012  . CHOLECYSTECTOMY N/A 05/08/2012   Procedure: LAPAROSCOPIC CHOLECYSTECTOMY;  Surgeon: Harl Bowie, MD;  Location: [place];  Service: General;   Laterality: N/A;  . COLONOSCOPY    . LIVER BIOPSY N/A 05/08/2012   Procedure: LIVER BIOPSY;  Surgeon: Harl Bowie, MD;  Location: [place];  Service: General;  Laterality: N/A;  . NECK SURGERY     cervical disc with cliip  . SHOULDER ARTHROSCOPY WITH DISTAL CLAVICLE RESECTION Right 12/12/2016   Procedure: RIGHT SHOULDER ARTHROSCOPY, SUBACROMIAL DECOMPRESSION, DISTAL CLAVICLE RESECTION, WITH POSSIBLE MINI OPEN ROTATOR CUFF REPAIR;  Surgeon: [name], MD;  Location: [place];  Service: Orthopedics;  Laterality: Right;    Allergies  Allergen Reactions  . Pirfenidone Other (See Comments)    Elevated bilirubin  . Acyclovir And Related Other (See Comments)    Unknown  . Oxycodone Itching  . Pravastatin Other (See Comments)    Chills    . Simvastatin Other (See Comments)    Chills    . Prednisone Anxiety and Other (See Comments)    Irritation  . Tegretol [Carbamazepine] Other (See Comments)    dizziness    Immunization History  Administered Date(s) Administered  . Influenza Split 10/04/2010, 09/28/2011  . Influenza, High Dose Seasonal PF 09/17/2016  . Influenza,inj,Quad PF,6+ Mos 09/28/2013, 10/17/2014, 10/23/2015  . Influenza-Unspecified 09/08/2011  . Pneumococcal Conjugate-13 10/03/2009, 09/28/2013  . Pneumococcal Polysaccharide-23 01/08/2008, 10/03/2009  . Td 10/07/2009    Family History  Problem Relation Age of Onset  . Hypertension Mother   . Congestive Heart Failure Mother   . COPD Mother   . Stroke Father   . Cancer Sister        breast     Current Outpatient Medications:  .  ALPRAZolam (XANAX) 0.5 MG tablet, TAKE 1/2 TO 1 TABLET BY MOUTH TWICE DAILY AS NEEDED, Disp: 20 tablet, Rfl: 0 .  apixaban (ELIQUIS) 5 MG TABS tablet, Take 1 tablet (5 mg total) by mouth 2 (two) times daily., Disp: 60 tablet, Rfl: 6 .  diltiazem (CARTIA XT) 120 MG 24 hr capsule, TAKE 1 CAPSULE(120 MG) BY MOUTH DAILY, Disp: 30 capsule, Rfl: 7 .  fexofenadine (ALLEGRA) 180 MG tablet,  Take 180 mg by mouth daily as needed for allergies or rhinitis., Disp: , Rfl:  .  fluticasone (FLONASE) 50 MCG/ACT nasal spray, Place 2 sprays  into both nostrils daily. (Patient taking differently: Place 1 spray into both nostrils daily. ), Disp: 16 g, Rfl: 5 .  HYDROcodone-acetaminophen (NORCO) 5-325 MG tablet, One pill 3 to 4 times a day to last 30 days, Disp: 105 tablet, Rfl: 0 .  omeprazole (PRILOSEC) 40 MG capsule, Take 40 mg by mouth daily. , Disp: , Rfl:  .  OXYGEN, Inhale 2.25 L into the lungs as needed (for shortness of breath)., Disp: , Rfl:  .  Polyethyl Glycol-Propyl Glycol (SYSTANE ULTRA OP), Place 1 drop into both eyes daily., Disp: , Rfl:  .  albuterol (PROVENTIL HFA;VENTOLIN HFA) 108 (90 Base) MCG/ACT inhaler, Inhale 2 puffs into the lungs every 6 (six) hours as needed for wheezing. (Patient not taking: Reported on 06/12/2017), Disp: 1 Inhaler, Rfl: 2 .  cefdinir (OMNICEF) 300 MG capsule, Take 1 capsule (300 mg total) by mouth 2 (two) times daily. (Patient not taking: Reported on 06/12/2017), Disp: 20 capsule, Rfl: 0 .  predniSONE (DELTASONE) 20 MG tablet, 3qd for 2d then 2qd for 2d then 1qd for 2d (Patient not taking: Reported on 06/12/2017), Disp: 12 tablet, Rfl: 0    Review of Systems     Objective:   Physical Exam  Constitutional: He is oriented to person, place, and time. He appears well-developed and well-nourished. No distress.  HENT:  Head: Normocephalic and atraumatic.  Right Ear: External ear normal.  Left Ear: External ear normal.  Mouth/Throat: Oropharynx is clear and moist. No oropharyngeal exudate.  Eyes: Pupils are equal, round, and reactive to light. Conjunctivae and EOM are normal. Right eye exhibits no discharge. Left eye exhibits no discharge. No scleral icterus.  Neck: Normal range of motion. Neck supple. No JVD present. No tracheal deviation present. No thyromegaly present.  Cardiovascular: Normal rate, regular rhythm and intact distal pulses. Exam reveals no  gallop and no friction rub.  No murmur heard. Pulmonary/Chest: Effort normal. No respiratory distress. He has no wheezes. He has rales. He exhibits no tenderness.  Abdominal: Soft. Bowel sounds are normal. He exhibits no distension and no mass. There is no tenderness. There is no rebound and no guarding.  Musculoskeletal: Normal range of motion. He exhibits no edema or tenderness.  Lymphadenopathy:    He has no cervical adenopathy.  Neurological: He is alert and oriented to person, place, and time. He has normal reflexes. No cranial nerve deficit. Coordination normal.  Skin: Skin is warm and dry. No rash noted. He is not diaphoretic. No erythema. No pallor.  Psychiatric: He has a normal mood and affect. His behavior is normal. Judgment and thought content normal.  Nursing note and vitals reviewed.  Vitals:   06/12/17 1136  BP: 124/88  Pulse: 64  SpO2: 96%  Weight: 227 lb (103 kg)  Height: 6\' 1"  (1.854 m)    Estimated body mass index is 29.95 kg/m as calculated from the following:   Height as of this encounter: 6\' 1"  (1.854 m).   Weight as of this encounter: 227 lb (103 kg).      Assessment:       ICD-10-CM   1. IPF (idiopathic pulmonary fibrosis) ([place]) J84.112         Plan:       Agree you have progressive disease; 7.6% loss of lung function In over a year; currently 45% normal capacity  You have gained weight due to diet issues  Respect you are not interested in approved anti-fibrotics due to gi side effects   Glad you are  interested in research trials but only for agents that do not have GI side effects  plan  - esbriet -never again  - we discussed ofev for IPF but also has gi side effect; so will hold off - recommend being aggressive with  weight loss -Reassess in the future for any pulmonary fibrosis trials that do not have GI side effects  - I looked up and you do not qualify for infusion trial FIbroGen that I talked about  - However, you likely wil qualify  for an inhaler trial called GALECTO; but you are at borderline for it  - Recommended pulmonary rehabilitation at [facility] last visit - did you start it  Follow-up - in 2 months do Pre-bd spiro and dlco only. No lung volume or bd response. No post-bd spiro -Return to see Dr. Chase Caller in ILD clinic in 2 months or sooner if needed  - will call for research protocol from [employer] sooner if possible    > 50% of this > 25 min visit spent in face to face counseling or coordination of care    Dr. Brand Males, M.D., [facility].C.P Pulmonary and Critical Care Medicine Staff Physician, [place] Director - Interstitial Lung Disease  Program  Pulmonary [place] at [place], [place], [address]  Pager: [phone], If no answer or between  15:00h - 7:00h: call [phone] Telephone: [phone]

## 2017-06-13 ENCOUNTER — Other Ambulatory Visit: Payer: Self-pay | Admitting: Family Medicine

## 2017-06-13 NOTE — Telephone Encounter (Signed)
Ok six mo worth 

## 2017-07-30 DIAGNOSIS — I10 Essential (primary) hypertension: Secondary | ICD-10-CM | POA: Diagnosis not present

## 2017-07-30 DIAGNOSIS — E785 Hyperlipidemia, unspecified: Secondary | ICD-10-CM | POA: Diagnosis not present

## 2017-07-30 DIAGNOSIS — Z125 Encounter for screening for malignant neoplasm of prostate: Secondary | ICD-10-CM | POA: Diagnosis not present

## 2017-07-31 LAB — LIPID PANEL
CHOL/HDL RATIO: 5.6 ratio — AB (ref 0.0–5.0)
Cholesterol, Total: 123 mg/dL (ref 100–199)
HDL: 22 mg/dL — ABNORMAL LOW (ref >39)
LDL Calculated: 77 mg/dL (ref 0–99)
Triglycerides: 119 mg/dL (ref 0–149)
VLDL Cholesterol Cal: 24 mg/dL (ref 5–40)

## 2017-07-31 LAB — BASIC METABOLIC PANEL
BUN / CREAT RATIO: 9 — AB (ref 10–24)
BUN: 9 mg/dL (ref 8–27)
CO2: 24 mmol/L (ref 20–29)
CREATININE: 0.96 mg/dL (ref 0.76–1.27)
Calcium: 8.8 mg/dL (ref 8.6–10.2)
Chloride: 106 mmol/L (ref 96–106)
GFR calc Af Amer: 90 mL/min/{1.73_m2} (ref >59)
GFR calc non Af Amer: 78 mL/min/{1.73_m2} (ref >59)
GLUCOSE: 98 mg/dL (ref 65–99)
Potassium: 4 mmol/L (ref 3.5–5.2)
Sodium: 143 mmol/L (ref 134–144)

## 2017-07-31 LAB — PSA: Prostate Specific Ag, Serum: 1.4 ng/mL (ref 0.0–4.0)

## 2017-07-31 LAB — HEPATIC FUNCTION PANEL
ALT: 12 IU/L (ref 0–44)
AST: 24 IU/L (ref 0–40)
Albumin: 3.7 g/dL (ref 3.5–4.8)
Alkaline Phosphatase: 114 IU/L (ref 39–117)
BILIRUBIN, DIRECT: 0.34 mg/dL (ref 0.00–0.40)
Bilirubin Total: 1.3 mg/dL — ABNORMAL HIGH (ref 0.0–1.2)
TOTAL PROTEIN: 7 g/dL (ref 6.0–8.5)

## 2017-08-07 ENCOUNTER — Ambulatory Visit (INDEPENDENT_AMBULATORY_CARE_PROVIDER_SITE_OTHER): Payer: PPO | Admitting: Family Medicine

## 2017-08-07 VITALS — BP 112/68 | Ht 73.0 in | Wt 222.8 lb

## 2017-08-07 DIAGNOSIS — I10 Essential (primary) hypertension: Secondary | ICD-10-CM

## 2017-08-07 DIAGNOSIS — I4819 Other persistent atrial fibrillation: Secondary | ICD-10-CM

## 2017-08-07 DIAGNOSIS — G8929 Other chronic pain: Secondary | ICD-10-CM

## 2017-08-07 DIAGNOSIS — K5792 Diverticulitis of intestine, part unspecified, without perforation or abscess without bleeding: Secondary | ICD-10-CM | POA: Diagnosis not present

## 2017-08-07 DIAGNOSIS — Z0000 Encounter for general adult medical examination without abnormal findings: Secondary | ICD-10-CM | POA: Diagnosis not present

## 2017-08-07 DIAGNOSIS — M542 Cervicalgia: Secondary | ICD-10-CM

## 2017-08-07 DIAGNOSIS — J84112 Idiopathic pulmonary fibrosis: Secondary | ICD-10-CM

## 2017-08-07 DIAGNOSIS — I481 Persistent atrial fibrillation: Secondary | ICD-10-CM | POA: Diagnosis not present

## 2017-08-07 MED ORDER — METRONIDAZOLE 500 MG PO TABS: 500.0000 mg | ORAL_TABLET | Freq: Three times a day (TID) | ORAL | 0 refills | Status: DC

## 2017-08-07 MED ORDER — CIPROFLOXACIN HCL 500 MG PO TABS: 500.0000 mg | ORAL_TABLET | Freq: Two times a day (BID) | ORAL | 0 refills | Status: DC

## 2017-08-07 MED ORDER — HYDROCODONE-ACETAMINOPHEN 5-325 MG PO TABS: ORAL_TABLET | ORAL | 0 refills | Status: DC

## 2017-08-07 NOTE — Progress Notes (Signed)
Subjective:    Patient ID: Curtis [name], male    DOB: 28-Apr-1945, 72 y.o.   MRN: [id number]  Hypertension  This is a chronic problem. The current episode started more than 1 year ago. Pertinent negatives include no chest pain, headaches or shortness of breath. Risk factors for coronary artery disease include male gender. Treatments tried: cardizem. There are no compliance problems.    AWV- Annual Wellness Visit  The patient was seen for their annual wellness visit. The patient's past medical history, surgical history, and family history were reviewed. Pertinent vaccines were reviewed ( tetanus, pneumonia, shingles, flu) The patient's medication list was reviewed and updated.  The height and weight were entered.  BMI recorded in electronic record elsewhere  Cognitive screening was completed. Outcome of Mini - Cog: Passed   Falls /depression screening electronically recorded within record elsewhere  Current tobacco usage: None (All patients who use tobacco were given written and verbal information on quitting)  Recent listing of emergency department/hospitalizations over the past year were reviewed.  current specialist the patient sees on a regular basis: Lung specialist   Medicare annual wellness visit patient questionnaire was reviewed.  A written screening schedule for the patient for the next 5-10 years was given. Appropriate discussion of followup regarding next visit was discussed.  This patient was seen today for chronic pain  The medication list was reviewed and updated.   -Compliance with medication: Has good compliance  - Number patient states they take daily: Takes 2 sometimes 4/day  -when was the last dose patient took?  Earlier today  The patient was advised the importance of maintaining medication and not using illegal substances with these.  Here for refills and follow up  The patient was educated that we can provide 3 monthly scripts for their  medication, it is their responsibility to follow the instructions.  Side effects or complications from medications: Denies any side effects  Patient is aware that pain medications are meant to minimize the severity of the pain to allow their pain levels to improve to allow for better function. They are aware of that pain medications cannot totally remove their pain.  Due for UDT ( at least once per year) : Later this year  Results for orders placed or performed in visit on 06/12/17  Pulmonary Function Test  Result Value Ref Range   FVC-Pre 2.17 L   FVC-%Pred-Pre 45 %   FEV1-Pre 1.74 L   FEV1-%Pred-Pre 49 %   FEV6-Pre 2.17 L   FEV6-%Pred-Pre 47 %   Pre FEV1/FVC ratio 80 %   FEV1FVC-%Pred-Pre 109 %   Pre FEV6/FVC Ratio 100 %   FEV6FVC-%Pred-Pre 106 %   FEF 25-75 Pre 1.66 L/sec   FEF2575-%Pred-Pre 64 %   DLCO unc 14.87 ml/min/mmHg   DLCO unc % pred 40 %   DL/VA 4.59 ml/min/mmHg/L   DL/VA % pred 96 %           Review of Systems  Constitutional: Negative for diaphoresis and fatigue.  HENT: Negative for congestion and rhinorrhea.   Respiratory: Negative for cough and shortness of breath.   Cardiovascular: Negative for chest pain and leg swelling.  Gastrointestinal: Negative for abdominal pain and diarrhea.  Skin: Negative for color change and rash.  Neurological: Negative for dizziness and headaches.  Psychiatric/Behavioral: Negative for behavioral problems and confusion.       Objective:   Physical Exam  Constitutional: He appears well-nourished. No distress.  HENT:  Head: Normocephalic and atraumatic.  Eyes: Right eye exhibits no discharge. Left eye exhibits no discharge.  Neck: No tracheal deviation present.  Cardiovascular: Normal rate, regular rhythm and normal heart sounds.  No murmur heard. Pulmonary/Chest: Effort normal and breath sounds normal. No respiratory distress.  Musculoskeletal: He exhibits no edema.  Lymphadenopathy:    He has no cervical  adenopathy.  Neurological: He is alert. Coordination normal.  Skin: Skin is warm and dry.  Psychiatric: He has a normal mood and affect. His behavior is normal.  Vitals reviewed.         Assessment & Plan:  The patient was seen in followup for chronic pain. A review over at their current pain status was discussed. Drug registry was checked. Prescriptions were given. Discussion was held regarding the importance of compliance with medication as well as pain medication contract.  Time for questions regarding pain management plan occurred. Importance of regular followup visits was discussed. Patient was informed that medication may cause drowsiness and should not be combined  with other medications/alcohol or street drugs. Patient was cautioned that medication could cause drowsiness. If the patient feels medication is causing altered alertness then do not drive or operate dangerous equipment.  Adult wellness-complete.wellness physical was conducted today. Importance of diet and exercise were discussed in detail.  In addition to this a discussion regarding safety was also covered. We also reviewed over immunizations and gave recommendations regarding current immunization needed for age.  In addition to this additional areas were also touched on including: Preventative health exams needed:  Colonoscopy 2025  Patient was advised yearly wellness exam  Chronic neck pain uses pain medicine  Probable diverticulitis antibiotics prescribed if it does not go away over the course of the next 7 days to notify us  Blood pressure good control continue medication watch diet  Idiopathic pulmonary fibrosis continue with specialist he should discuss shin Grix vaccine with the specialist if he needs a prescription for he will let us know  Follow-up 3 months  Atrial fib on a good control-if ongoing troubles or any problems notify us

## 2017-08-15 ENCOUNTER — Ambulatory Visit (INDEPENDENT_AMBULATORY_CARE_PROVIDER_SITE_OTHER): Payer: PPO | Admitting: Internal Medicine

## 2017-08-15 ENCOUNTER — Encounter: Payer: Self-pay | Admitting: Internal Medicine

## 2017-08-15 ENCOUNTER — Ambulatory Visit: Payer: PPO | Admitting: Internal Medicine

## 2017-08-15 VITALS — BP 118/70 | HR 65 | Ht 73.0 in | Wt 225.0 lb

## 2017-08-15 DIAGNOSIS — J84112 Idiopathic pulmonary fibrosis: Secondary | ICD-10-CM

## 2017-08-15 DIAGNOSIS — Z006 Encounter for examination for normal comparison and control in clinical research program: Secondary | ICD-10-CM | POA: Diagnosis not present

## 2017-08-15 LAB — PULMONARY FUNCTION TEST
DL/VA % PRED: 95 %
DL/VA: 4.54 ml/min/mmHg/L
DLCO UNC: 13.97 ml/min/mmHg
DLCO unc % pred: 38 %
FEF 25-75 Pre: 1.82 L/sec
FEF2575-%Pred-Pre: 70 %
FEV1-%Pred-Pre: 51 %
FEV1-Pre: 1.82 L
FEV1FVC-%Pred-Pre: 111 %
FEV6-%Pred-Pre: 49 %
FEV6-Pre: 2.22 L
FEV6FVC-%Pred-Pre: 105 %
FVC-%PRED-PRE: 46 %
FVC-Pre: 2.24 L
PRE FEV6/FVC RATIO: 99 %
Pre FEV1/FVC ratio: 81 %

## 2017-08-15 NOTE — Patient Instructions (Addendum)
IPF (idiopathic pulmonary fibrosis) ([place])  - progression mild since March 2018 -> Aug 2019 but improved since June 2019 - contiue supportive care - continue exercise at home; too bad rehab is expensive - use portable o2 when o2 drops < 88%  Research subject - appreciate your interest in research - appreciate your willingness to be compliant - will have CMA update your contact information as follows  - remove home phone number from list  - list wife Bethena Roys cell to [phone] and email [email or web address]  - will let research staff know they can contact Ms Bethena Roys via email or cell  - discussed research as care option and best option is Fibrogen Phase 3 Zephyrus study   - take consent copy to read   - will contact you regarding fibrogen study in sept 2019  - will hve Cam Mosley contact you next few weeks for IPF registry   Followp 3 months to ILD clinic

## 2017-08-15 NOTE — Progress Notes (Addendum)
Subjective:     Patient ID: Curtis [name], male   DOB: 1945/05/16, 72 y.o.   MRN: [id number]  HPI  83, 72 y.o.   MRN: [id number]  Chief Complaint  Patient presents with  . Follow-up    IPF     IOV 02/09/2016  Chief Complaint  Patient presents with  . Pulmonary Consult    Referred by Dr. Wolfgang Phoenix for pulomnary fibrosis, Pt. states his breathing is ok, Pt. c/o of sob with exertion, slight cough, Denies chest tightness    72 year old male accompanied by his wife Curtis [name]. He has a diagnosis of only fibrosis with high pretest probability of idiopathic Pulmonray fibrosis being managed at [facility] by Dr. 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 History is gained from him, talking to his wife and review and summarization of the [employer]. He has had remote asbestos exposure while working in [employer]. 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 He also has a 26 pack smoking history which he quit in 2004. His pulmonary imaging has never showed pleural plaques. He's had insidious worsening of dyspnea for many years. His CT scans at [facility] are reported as possible UIP. He's also been maintained on low-dose prednisone which according to the review of [employer] notes in this has helped him. He is low antibody titer positive for ANA and MI 2 in 2014 . Diagnosis given was of IPF. In October 2017 follow-up visit to [facility] according to the patient and review of the charts show 11/01/2015 high-resolution CT chest showed progression of disease or lung disease compared to 2014 and change in the pattern from possible UIP per definitive UIP. In addition there is also lingula nodule that measured 8 mm up from 6 mm in 2014. Given the progression and the definite change in CT chest the diagnosis of definite of UIP/IPF was made and the patient was counseled about starting specific anti-fibrotic therapy. He and his wife are fully aware of the 2  anti-fibrotic regimens of Pirfenidone (Esbriet) and Ofev. He says his Coumadin to starting one of these at our consult.   He also tells me the year 2017 he's had several setbacks with this helped including atrial fibrillation and he is now on anticoagulations wary of taking Ofev. He is also had Bell's palsy and an neurologic issue. Taking into account of all this and is visible obesity feels dyspnea is worsened because of lung disease.  He is also wondering how much of his pulmonary fibrosis is related to previous asbestos exposure.  In terms of disease severity: He uses oxygen with exertion. In 2013 FVC and DLCO 60% each. In January 2017 his FVC is 2.2 L/42% predicted with FEV1 of 1.6 L/42% predicted, TLC of 47% predicted and DLCO 14.2/55% predicted suggesting overall slow steady progression.  In terms of comorbidities nuclear medicine cardiac stress of 02/02/2016 was low risk  Diffuse parenchymal lung disease prob list in [facility] jan 2017 [facility] notes copied and pasted and summarized A. Possible UIP pattern by current ATS criteria -> changed to progression and definite UIP Oct 2017 B. Significant asbestos exposure in [employer]  C. 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 Subjective improvement with Advair; no air trapping seen on CT  E. No symptoms suggestive of CTD; labs show aldolase of 10.2 and weak positive MI-2.  F. TPMT level is 8.7 (low), indicating he may not metabolize azathioprine effectively.  2. Exertional hypoxia,  improved.  OV 03/15/2016  Chief Complaint  Patient presents with  . Follow-up    Pt states he is waiting on a form from [place] to start his Esbriet. Pt states his breathing is unchanged since last OV. Pt here after PFT.      Follow-up idiopathic pulmonary fibrosis also asbestos exposure but without pleural plaques  He presents with his son and? Daughter. He is here to discuss esbriet Start is going. However he has run into problems with charity and paperwork.  In addition apparently [place] did not disclose my last name insert resulted in confusion. Apparently they disclose only the first name. Overall he is stable.. Pulmonary function testing done today at [facility] but the result is not yet available. We discussed some of the remaining items such as a letter about asbestos exposure causing ILD, pulmonary fibrosis foundation support group and referred to pulmonary rehabilitation.     04/26/2016 Follow up: IPF /ER follow up  Pt returns for follow up from recent ER visit . Pt says he recently has been seen for acute abdominal pain and chest pain and nausea. ER workup was unrevealing for cardiac source . CXR showed chronic IPF . No acute process. Troponin /BNP nml . AST /ALT were nnl . T. Bili was elevated at 3.4.  Patient says about a week ago that he developed some significant nausea, lower abdominal pain and chest tightness. He had no vomiting or diarrhea. Patient recently started on Esbriet . He initially started on 1 capsule 3 times a day. On the second week he began 2 capsules 3 times a day. On the day of the increased dose titration patient abdominal symptoms began. Patient has held aspirin. Patient was seen by his primary care physician. He was felt to have possible diverticulitis. And started on Cipro and Flagyl.. Patient is on chronic narcotics and has constipation. CT abdomen and pelvis was done on 04/23/2015 that showed sigmoid diverticulosis without diverticulitis. Cirrhotic appearing liver . No mass noted.  It appears a ABD [place] is pending from PCP.   Patient presents today and says that he is feeling much better. Nausea has resolved. Stomach is feeling better and patient's appetite has returned.  Patient is very concerned that he has held his Esbriet. He felt that the short week that he was on this. His breathing improved substantially. He had less shortness of breath and improved exercise tolerance.    OV 09/17/2016  Chief Complaint   Patient presents with  . Follow-up    Pt states his SOB is unchanged since last OV. Pt states he received an abx from PCP for a prod cough - pt states his cough has resovled. Pt denies CP/tightness and f/c/s.    Follow-up idiopathic pulmonary fibrosis  Around late March or early April 2018 he started Pirfenidone (Esbriet). He says that shortly after starting it he started feeling good but then he escalated the dose he started having GI side effects. In April 2018 he did have CT scan of the chest that did not show any diverticulitis. Nevertheless review of the chart shows he was treated as diverticulitis. He did have hyperbilirubinemia. With this his Pirfenidone (Esbriet) was stopped and his GI symptoms resolved. He is frustrated that Pirfenidone (Esbriet) caused the symptoms. At this point in time for his IPF given the significant GI side effects is not interested in [place]. Overall since then he's intentionally lost weight. His goal is to get below 200 pounds. With the loss of weight  he's feeling better. Shortness of breath is improved. He is not yet attending pulmonary rehabilitation. He is asking about innogen oxygen system for help with exertional hypoxemia although he recognizes that with weight loss this might not be an issue anymore. He is willing to have high dose flu shot is on Indocin basic supportive care for his IPF and this point   IMPRESSION: 1. Pattern progressive pulmonary fibrosis is suspicious for usual interstitial pneumonitis, despite lack of a Strong craniocaudal gradient. Fibrotic nonspecific interstitial pneumonitis is not excluded.There is a peripheral pattern of subpleural reticulation, traction bronchiectasis/bronchiolectasis, ground-glass and honeycombing, progressive from 08/15/2009 2. Aortic atherosclerosis (ICD10-170.0). Three-vessel involvement of the coronary arteries. 3. Cirrhosis.   Electronically Signed   By: Lorin Picket M.D.   On: 09/11/2016  10:37       OV 06/12/2017  Chief Complaint  Patient presents with  . Follow-up    PFT done today, reports increased oxygen use.     [place] , 72 y.o. , with dob 1945/07/22 and male ,Not Hispanic or Latino from [place] [address] - presents to ILD  clinic for IPF followup.  He is on basic supportive care after having significant side effects from pirfenidone.  He does not want to do nintedanib because of fear of GI side effects.  Since his last visit he reports progressive dyspnea.  Curtis [name] work is becoming difficult.  Oxygen helps his symptoms but he uses the oxygen only inside the house because the machine is big.  He wants a portable oxygen.  We walked 285 feet x 3 laps in the office and he became only mildly short of breath but did not desaturate below 88%.  Then we took him up the stairs and made him walk 3 flights of stairs and he became very short of breath but did not desaturate below 96%.  He got very tachycardic and became dyspneic.  He is yet to attend pulmonary rehabilitation.  He is very interested in research trials because he absolutely does not want to do any approved anti-fibrotic's because of GI side effects.  He is also only interested in a research trial if he can\begin agent that has low risk of GI side effects.  He was trying to lose weight but now has gained 5 or 6 pounds because of his love for  biscuit and gravy   OV 08/15/2017  Chief Complaint  Patient presents with  . Follow-up    2 month follow up. Patient states that his breathing has been ok since last visit.      FU IPF - clinical high pretest prb dx Oct 2017 [facility] based on CT finding  Curtis [name] presents with his wife Curtis [name]. He says that since his last visit in June 2019 he feels a little better because he suffered from a cold and now he has recovered. He has not attended pulmonary rehabilitation due to out-of-pocket expense a $15 a visit 2 times a week 8 weeks.  However he is exerting himself in the garden and keeps himself it.Wife reports that with heavy exertion his pulse ox does drop below 88% to 86%. He does use oxygen during this time and it helped some. He again stated that he is not interested in oral antibiotics because ofsevere GI side effects with the esbriet. He is not interested in trying Ofev., He is on the IPF registry program. According to research coordinators he has not been compliant with phone calls to answer questionnaires.  Apparently a certified letter was sent but without any response. Patient tells me that he does not recollect any of this. Wife also test the same he tells me that his home number has been disconnected and he only uses cellphone which is up-to-date in our demographics.He does not have an email but his wife does have an email and she gave this to me. He is very interested in research at the care option. In particular we discussed several trials that we have including phase I, baseline phase 3. He is most interested in the phase 3 Zephyrus trial sponsored by [employer] involving monoclonal antibody againstconnective tissue growth factor. He and his wife insists that he will be compliant with study visits and will follow all directives. He understands there is a one out of 3 chance he will get placebo also understands this is voluntary and this isn a  Unapproved med in  development   Results for Curtis, [name] (MRN [id number]) as of 08/15/2017 10:13  Ref. Range 03/15/2016 07:34 06/12/2017 10:32 08/15/2017 09:03  FVC-Pre Latest Units: L 2.35 2.17 2.24  FVC-%Pred-Pre Latest Units: % 48 45 46   Results for Curtis, [name] (MRN [id number]) as of 08/15/2017 10:13  Ref. Range 03/15/2016 07:34 06/12/2017 10:32 08/15/2017 09:03  DLCO unc Latest Units: ml/min/mmHg 13.30 14.87 13.97  DLCO unc % pred Latest Units: % 36 40 38     Simple office walk 185 feet x  3 laps goal with forehead probe 06/12/2017  08/15/2017   O2 used Room air Room air   Number laps completed 3 3  Comments about pace normal prettry fast  Resting Pulse Ox/HR 98% and 65/min 98% and HR 68/min  Final Pulse Ox/HR 90% and 105/min 90% and HR 103/min  Desaturated </= 88% No but came close no  Desaturated <= 3% points yes yes  Got Tachycardic >/= 90/min yes yes  Symptoms at end of test x Little dyspnea, no cp, no leg pain  Miscellaneous comments X - repeated walk test on 3 lfights of stairs. Got very very dyspneic. No chest pain, Pulze ox 1005 -> 96% but HR 65 -> 115        Review of Systems     Objective:   Physical Exam  Constitutional: He is oriented to person, place, and time. He appears well-developed and well-nourished. No distress.  HENT:  Head: Normocephalic and atraumatic.  Right Ear: External ear normal.  Left Ear: External ear normal.  Mouth/Throat: Oropharynx is clear and moist. No oropharyngeal exudate.  Eyes: Pupils are equal, round, and reactive to light. Conjunctivae and EOM are normal. Right eye exhibits no discharge. Left eye exhibits no discharge. No scleral icterus.  Neck: Normal range of motion. Neck supple. No JVD present. No tracheal deviation present. No thyromegaly present.  Cardiovascular: Normal rate, regular rhythm and intact distal pulses. Exam reveals no gallop and no friction rub.  No murmur heard. Pulmonary/Chest: Effort normal. No respiratory distress. He has no wheezes. He has rales. He exhibits no tenderness.  Abdominal: Soft. Bowel sounds are normal. He exhibits no distension and no mass. There is no tenderness. There is no rebound and no guarding.  Musculoskeletal: Normal range of motion. He exhibits no edema or tenderness.  Lymphadenopathy:    He has no cervical adenopathy.  Neurological: He is alert and oriented to person, place, and time. He has normal reflexes. No cranial nerve deficit. Coordination normal.  Skin: Skin is warm and dry. No rash noted. He is not diaphoretic.  No erythema. No pallor.  Psychiatric: He  has a normal mood and affect. His behavior is normal. Judgment and thought content normal.  Nursing note and vitals reviewed.   Vitals:   08/15/17 0954  BP: 118/70  Pulse: 65  SpO2: 95%  Weight: 225 lb (102.1 kg)  Height: 6\' 1"  (1.854 m)    Estimated body mass index is 29.69 kg/m as calculated from the following:   Height as of this encounter: 6\' 1"  (1.854 m).   Weight as of this encounter: 225 lb (102.1 kg).       Assessment:       ICD-10-CM   1. IPF (idiopathic pulmonary fibrosis) ([place]) J84.112   2. Research subject Z00.6        Plan:     IPF (idiopathic pulmonary fibrosis) ([place])  - progression mild since March 2018 -> Aug 2019 but improved since June 2019 - contiue supportive care; again you stated no interest in ofev  Due to gi side effects after having esbriet side effect - continue exercise at home; too bad rehab is expensive - use portable o2 when o2 drops < 88% - discus at [facility] in sept 2019  Research subject - appreciate your interest in research - appreciate your willingness to be compliant - understand that you feel there is poor communication from [facility] for you to be compliant with registry but appreciate that you have continued intereste - will have CMA update your contact information as follows  - remove home phone number from list  - list wife Curtis [name] cell to [phone] and email [email or web address]  - will let research staff know they can contact Ms Curtis [name] via email or cell  - discussed research as care option and best option is Fibrogen Phase 3 Zephyrus study   - take consent copy to read   - will contact you regarding fibrogen study in sept 2019  - will hve Curtis [name] contact you next few weeks for IPF registry     Followp 3 months to ILD clinic   > 50% of this > 40 min visit spent in face to face counseling or/and coordination of care - by this undersigned MD - Dr Brand Males. This includes one or more of the following documented  above: discussion of test results, diagnostic or treatment recommendations, prognosis, risks and benefits of management options, instructions, education, compliance or risk-factor reduction   Dr. Brand Males, M.D., [facility].C.P Pulmonary and Critical Care Medicine Staff Physician, [place] Director - Interstitial Lung Disease  Program  Pulmonary [place] at [place], [place], [address]  Pager: [phone], If no answer or between  15:00h - 7:00h: call [phone] Telephone: [phone]

## 2017-08-15 NOTE — Progress Notes (Signed)
PFT completed today. 08/15/17  

## 2017-09-05 ENCOUNTER — Other Ambulatory Visit: Payer: Self-pay | Admitting: Family Medicine

## 2017-09-05 MED ORDER — ALPRAZOLAM 0.5 MG PO TABS: ORAL_TABLET | ORAL | 1 refills | Status: DC

## 2017-09-05 NOTE — Telephone Encounter (Signed)
Rx printed awaiting Dr signature. 

## 2017-09-05 NOTE — Telephone Encounter (Signed)
Pt requesting refill on ALPRAZolam (XANAX) 0.5 MG tablet. Please send to [place], [address]

## 2017-09-05 NOTE — Telephone Encounter (Signed)
Prescription faxed to pharmacy. Patient notified. 

## 2017-09-05 NOTE — Telephone Encounter (Signed)
Ok times one with one ref

## 2017-09-19 ENCOUNTER — Telehealth: Payer: Self-pay | Admitting: Internal Medicine

## 2017-09-19 NOTE — Telephone Encounter (Signed)
Curtis [name] has been referred to [facility] for pre-screening for eligibility for Fibrogen study. During the screening process it was determined that Curtis [name] was not eligible for participation due to not meeting all of the inclusion criteria. The primary purpose of the attempted call was to make the patient aware of this finding. Additionally, the patient could potentially qualify for a separate study also being conducted and wanted to inform the patient of another clinical care option of a different trial that is being conducted at our site. However three attempts where made to contact the patient at the number that he provided the office during his last visit which was [phone] and I was unsuccessful with making contact. After two unssucessfull attempts a third attempt was made and voice mail was left on the patients messaging system for the number provided and asked the patient return my call when he could.

## 2017-09-19 NOTE — Telephone Encounter (Signed)
Thanks    SIGNATURE    Dr. Brand Males, M.D., F.C.C.P,  Pulmonary and Critical Care Medicine Staff Physician, [place] Director - Interstitial Lung Disease  Program  [facility] [place] at [place], [place], [address]  Pager: [phone], If no answer or between  15:00h - 7:00h: call [phone] Telephone: [phone]  2:25 PM 09/19/2017

## 2017-10-07 ENCOUNTER — Telehealth: Payer: Self-pay | Admitting: Internal Medicine

## 2017-10-07 NOTE — Telephone Encounter (Signed)
Attempt to call patient. LMTCB x1.  See phone note from 09/19/17 in regards to the studies.

## 2017-10-08 NOTE — Telephone Encounter (Signed)
Pt is calling back [phone]

## 2017-10-08 NOTE — Telephone Encounter (Signed)
Called and spoke to patient. Stated the 601 number is his wifes and he would like to be called at [phone]. I explained to patient the research team would be contacting him.   Routing to [employer].

## 2017-10-15 ENCOUNTER — Ambulatory Visit: Payer: PPO | Admitting: Cardiology

## 2017-10-15 NOTE — Progress Notes (Deleted)
Electrophysiology Office Note   Date:  10/15/2017   ID:  Fulton Merry, DOB July 16, 1945, MRN [id number]  PCP:  Kathyrn Drown, MD  Cardiologist:  Johnsie Cancel Primary Electrophysiologist:  Will Meredith Leeds, MD    No chief complaint on file.    History of Present Illness: Bueford Arp is a 72 y.o. male who is being seen today for the evaluation of atrial fibillation at the request of Luking, Elayne Snare, MD. Presenting today for electrophysiology evaluation. He has a history of persistent atrial fibrillation status post Tikosyn loading, hypertension, hyperlipidemia, COPD and interstitial lung disease. His Tikosyn load was on 02/22/16.  He has done well. He was previously havin issues with fatigue, weakness, anorexia which he thought was due to his potassium, magnesium, and Tikosyn. Stopped those prior to his last visit.  Today, denies symptoms of palpitations, chest pain, shortness of breath, orthopnea, PND, lower extremity edema, claudication, dizziness, presyncope, syncope, bleeding, or neurologic sequela. The patient is tolerating medications without difficulties. ***    Past Medical History:  Diagnosis Date  . Arthritis   . Asthma   . Atrial fibrillation ([place])   . Cataract   . Cervical spondylosis   . Chest pain    Emergency room October 24, 2011, no MI  //   Nuclear, October, 2013, adenosine, EF 61%, no scar or ischemia  . Dyslipidemia    Statin intolerant  . Dyspnea   . GERD (gastroesophageal reflux disease)   . Glaucoma   . Hearing loss   . Hypertension   . Hypertension   . Idiopathic pulmonary fibrosis ([place])   . Interstitial pulmonary fibrosis ([place]) 2012  . Left shoulder pain 2011  . Myocardial infarction ([place])   . Psoriasis   . Pulmonary fibrosis ([place])   . Statin intolerance    Past Surgical History:  Procedure Laterality Date  . BRAIN SURGERY    . CARDIOVERSION N/A 12/13/2015   Procedure: CARDIOVERSION;  Surgeon: Josue Hector, MD;  Location: [facility];  Service: Cardiovascular;  Laterality: N/A;  . CATARACT EXTRACTION W/PHACO Right 06/11/2012   Procedure: CATARACT EXTRACTION PHACO AND INTRAOCULAR LENS PLACEMENT ([place]);  Surgeon: Tonny Branch, MD;  Location: [facility];  Service: Ophthalmology;  Laterality: Right;  CDE:  14.43  . CATARACT EXTRACTION W/PHACO Left 06/29/2012   Procedure: CATARACT EXTRACTION PHACO AND INTRAOCULAR LENS PLACEMENT ([facility]);  Surgeon: Tonny Branch, MD;  Location: [facility];  Service: Ophthalmology;  Laterality: Left;  CDE:14.11  . CHOLECYSTECTOMY  05/08/2012  . CHOLECYSTECTOMY N/A 05/08/2012   Procedure: LAPAROSCOPIC CHOLECYSTECTOMY;  Surgeon: Harl Bowie, MD;  Location: [facility];  Service: General;  Laterality: N/A;  . COLONOSCOPY    . LIVER BIOPSY N/A 05/08/2012   Procedure: LIVER BIOPSY;  Surgeon: Harl Bowie, MD;  Location: [facility];  Service: General;  Laterality: N/A;  . NECK SURGERY     cervical disc with cliip  . SHOULDER ARTHROSCOPY WITH DISTAL CLAVICLE RESECTION Right 12/12/2016   Procedure: RIGHT SHOULDER ARTHROSCOPY, SUBACROMIAL DECOMPRESSION, DISTAL CLAVICLE RESECTION, WITH POSSIBLE MINI OPEN ROTATOR CUFF REPAIR;  Surgeon: [name], MD;  Location: [facility];  Service: Orthopedics;  Laterality: Right;     Current Outpatient Medications  Medication Sig Dispense Refill  . albuterol (PROVENTIL HFA;VENTOLIN HFA) 108 (90 Base) MCG/ACT inhaler Inhale 2 puffs into the lungs every 6 (six) hours as needed for wheezing. 1 Inhaler 2  . ALPRAZolam (XANAX) 0.5 MG tablet TAKE 1/2 TO 1 TABLET BY MOUTH TWICE DAILY AS NEEDED 20  tablet 1  . apixaban (ELIQUIS) 5 MG TABS tablet Take 1 tablet (5 mg total) by mouth 2 (two) times daily. 60 tablet 6  . ciprofloxacin (CIPRO) 500 MG tablet Take 1 tablet (500 mg total) by mouth 2 (two) times daily. 20 tablet 0  . diltiazem (CARTIA XT) 120 MG 24 hr capsule TAKE 1 CAPSULE(120 MG) BY MOUTH DAILY 30 capsule 7  . fexofenadine (ALLEGRA) 180 MG tablet Take 180 mg by mouth daily as  needed for allergies or rhinitis.    . fluticasone (FLONASE) 50 MCG/ACT nasal spray Place 2 sprays into both nostrils daily. (Patient taking differently: Place 1 spray into both nostrils daily. ) 16 g 5  . HYDROcodone-acetaminophen (NORCO) 5-325 MG tablet One pill 3 to 4 times a day to last 30 days 105 tablet 0  . metroNIDAZOLE (FLAGYL) 500 MG tablet Take 1 tablet (500 mg total) by mouth 3 (three) times daily. 21 tablet 0  . omeprazole (PRILOSEC) 40 MG capsule Take 40 mg by mouth daily.     . OXYGEN Inhale 2.25 L into the lungs as needed (for shortness of breath).    Vladimir Faster Glycol-Propyl Glycol (SYSTANE ULTRA OP) Place 1 drop into both eyes daily.     No current facility-administered medications for this visit.     Allergies:   Pirfenidone; Acyclovir and related; Oxycodone; Pravastatin; Simvastatin; Prednisone; and Tegretol [carbamazepine]   Social History:  The patient  reports that he quit smoking about 14 years ago. His smoking use included cigarettes. He has a 26.00 pack-year smoking history. He has never used smokeless tobacco. He reports that he does not drink alcohol or use drugs.   Family History:  The patient's family history includes COPD in his mother; Cancer in his sister; Congestive Heart Failure in his mother; Hypertension in his mother; Stroke in his father.  ROS:  Please see the history of present illness.   Otherwise, review of systems is positive for ***.   All other systems are reviewed and negative.   PHYSICAL EXAM: VS:  There were no vitals taken for this visit. , BMI There is no height or weight on file to calculate BMI. GEN: Well nourished, well developed, in no acute distress  HEENT: normal  Neck: no JVD, carotid bruits, or masses Cardiac: ***RRR; no murmurs, rubs, or gallops,no edema  Respiratory:  clear to auscultation bilaterally, normal work of breathing GI: soft, nontender, nondistended, + BS MS: no deformity or atrophy  Skin: warm and dry Neuro:   Strength and sensation are intact Psych: euthymic mood, full affect  EKG:  EKG {ACTION; IS/IS [id number]} ordered today. Personal review of the ekg ordered *** shows ***   Recent Labs: 12/12/2016: Hemoglobin 13.1; Platelets 108 07/30/2017: ALT 12; BUN 9; Creatinine, Ser 0.96; Potassium 4.0; Sodium 143    Lipid Panel     Component Value Date/Time   CHOL 123 07/30/2017 0957   TRIG 119 07/30/2017 0957   HDL 22 (L) 07/30/2017 0957   CHOLHDL 5.6 (H) 07/30/2017 0957   CHOLHDL 4.4 06/13/2015 0728   VLDL 20 06/13/2015 0728   LDLCALC 77 07/30/2017 0957     Wt Readings from Last 3 Encounters:  08/15/17 225 lb (102.1 kg)  08/07/17 222 lb 12.8 oz (101.1 kg)  06/12/17 227 lb (103 kg)      Other studies Reviewed: Additional studies/ records that were reviewed today include: TTE 11/23/15  Review of the above records today demonstrates:  -Left ventricle: The cavity size was normal.  Wall thickness was   normal. The estimated ejection fraction was 55%. Although no   diagnostic regional wall motion abnormality was identified, this   possibility cannot be completely excluded on the basis of this   study. - Aortic valve: Moderately calcified annulus. Trileaflet; mildly   calcified leaflets. Cusp separation was reduced. Aortic valve was   not completely interrogated. There looks to be mild aortic   stenosis. Valve area by planimetry 1.8 cm^2. - Mitral valve: Calcified annulus. Mildly thickened, mildly   calcified leaflets . There was mild regurgitation. - Left atrium: The atrium was moderately dilated. - Right atrium: The atrium was mildly dilated. Central venous   pressure (est): 3 mm Hg. - Atrial septum: No defect or patent foramen ovale was identified. - Tricuspid valve: There was trivial regurgitation. - Pulmonary arteries: PA peak pressure: 30 mm Hg (S). - Pericardium, extracardiac: There was no pericardial effusion.   SPECT 02/05/16  There was no ST segment deviation noted  during stress.  Defect 1: There is a small defect of mild severity present in the basal inferolateral and mid inferolateral location.  This is a low risk study.   Low risk stress nuclear study with a small fixed inferolateral defect. Ischemia is not seen.  ASSESSMENT AND PLAN:  1.  Persistent atrial fibrillation: Eliquis.  He was on Tigas in the past but was stopped due to anorexia.  He is remained in sinus rhythm.  No changes at this time.  Due to lung disease, would prefer to avoid amiodarone.***  This patients CHA2DS2-VASc Score and unadjusted Ischemic Stroke Rate (% per year) is equal to 2.2 % stroke rate/year from a score of 2  Above score calculated as 1 point each if present [CHF, HTN, DM, Vascular=MI/PAD/Aortic Plaque, Age if 65-74, or Male] Above score calculated as 2 points each if present [Age > 75, or Stroke/TIA/TE]   2. Hypertension: ***  Current medicines are reviewed at length with the patient today.   The patient does not have concerns regarding his medicines.  The following changes were made today: ***  Labs/ tests ordered today include:  No orders of the defined types were placed in this encounter.    Disposition:   FU with Will Camnitz *** months  Signed, Will Meredith Leeds, MD  10/15/2017 10:15 AM     [facility] [address] [place] [address] [phone] (office) [phone] (fax)

## 2017-10-22 ENCOUNTER — Other Ambulatory Visit (HOSPITAL_COMMUNITY): Payer: Self-pay | Admitting: Nurse Practitioner

## 2017-10-22 ENCOUNTER — Ambulatory Visit (INDEPENDENT_AMBULATORY_CARE_PROVIDER_SITE_OTHER): Payer: PPO | Admitting: Family Medicine

## 2017-10-22 ENCOUNTER — Encounter: Payer: Self-pay | Admitting: Family Medicine

## 2017-10-22 VITALS — BP 128/88 | Ht 73.0 in | Wt 227.0 lb

## 2017-10-22 DIAGNOSIS — I4819 Other persistent atrial fibrillation: Secondary | ICD-10-CM

## 2017-10-22 DIAGNOSIS — Z23 Encounter for immunization: Secondary | ICD-10-CM

## 2017-10-22 DIAGNOSIS — Z79891 Long term (current) use of opiate analgesic: Secondary | ICD-10-CM | POA: Diagnosis not present

## 2017-10-22 DIAGNOSIS — M542 Cervicalgia: Secondary | ICD-10-CM | POA: Diagnosis not present

## 2017-10-22 DIAGNOSIS — G8929 Other chronic pain: Secondary | ICD-10-CM | POA: Diagnosis not present

## 2017-10-22 DIAGNOSIS — J84112 Idiopathic pulmonary fibrosis: Secondary | ICD-10-CM

## 2017-10-22 MED ORDER — HYDROCODONE-ACETAMINOPHEN 5-325 MG PO TABS: ORAL_TABLET | ORAL | 0 refills | Status: DC

## 2017-10-22 MED ORDER — PANTOPRAZOLE SODIUM 40 MG PO TBEC: 40.0000 mg | DELAYED_RELEASE_TABLET | Freq: Every day | ORAL | 3 refills | Status: DC

## 2017-10-22 NOTE — Progress Notes (Signed)
Subjective:    Patient ID: Curtis [name], male    DOB: [birth year], 72 y.o.   MRN: [id number]  HPI  Patient is here today with complaints of reflux he states he has been having some issues with it for some time. He sees a GI Dr for it is no longer taking the prilosec he is taking tums daily. He also wants a flu shot while here.  Patient does have atrial fib he is on anticoagulant he denies excessive breathing issues other than what his chronic lung issue causes him he is still able to do some activity he denies bleeding problems but he admits to using Advil I cautioned him against this and told him not to take the Advil because of increased risk of bleeding  Patient having significant GERD symptoms with epigastric discomfort reflux symptoms has tried some leftover omeprazole with some help has tried Tums and this helps  This patient was seen today for chronic pain  The medication list was reviewed and updated.   -Compliance with medication: Patient states compliance uses his medicine almost every day  - Number patient states they take daily: Takes anywhere between 2 and 4 daily recently taking 4/day  -when was the last dose patient took?  Earlier today  The patient was advised the importance of maintaining medication and not using illegal substances with these.  Here for refills and follow up  The patient was educated that we can provide 3 monthly scripts for their medication, it is their responsibility to follow the instructions.  Side effects or complications from medications: Denies any side effects  Patient is aware that pain medications are meant to minimize the severity of the pain to allow their pain levels to improve to allow for better function. They are aware of that pain medications cannot totally remove their pain.  Due for UDT ( at least once per year) : On a yearly basis will be due later this year       Review of Systems  Constitutional: Negative for  activity change, appetite change and fatigue.  HENT: Negative for congestion and rhinorrhea.   Respiratory: Negative for cough and shortness of breath.   Cardiovascular: Negative for chest pain and leg swelling.  Gastrointestinal: Negative for abdominal pain, nausea and vomiting.  Neurological: Negative for dizziness and headaches.  Psychiatric/Behavioral: Negative for agitation and behavioral problems.       Objective:   Physical Exam  Constitutional: He appears well-nourished.  Cardiovascular: Normal rate, regular rhythm and normal heart sounds.  No murmur heard. Pulmonary/Chest: Effort normal and breath sounds normal.  Musculoskeletal: He exhibits no edema.  Lymphadenopathy:    He has no cervical adenopathy.  Neurological: He is alert.  Psychiatric: His behavior is normal.  Vitals reviewed. Negative straight leg raise Has good flexibility Subjective discomfort in his lower back Decent strength         Assessment & Plan:  Patient has pulmonary fibrosis stable currently still able to be somewhat active He is at high risk of pneumonia so therefore we will go ahead and do a booster of his pneumococcal 23 Flu shot today  The patient was seen in followup for chronic pain. A review over at their current pain status was discussed. Drug registry was checked. Prescriptions were given. Discussion was held regarding the importance of compliance with medication as well as pain medication contract.  Time for questions regarding pain management plan occurred. Importance of regular followup visits was discussed. Patient was informed that  medication may cause drowsiness and should not be combined  with other medications/alcohol or street drugs. Patient was cautioned that medication could cause drowsiness. If the patient feels medication is causing altered alertness then do not drive or operate dangerous equipment.  Due to reflux we will go ahead and start Protonix if he is not  significantly better within 2 weeks he will notify us otherwise continue Protonix in place of omeprazole  25 minutes was spent with the patient.  This statement verifies that 25 minutes was indeed spent with the patient.  More than 50% of this visit-total duration of the visit-was spent in counseling and coordination of care. The issues that the patient came in for today as reflected in the diagnosis (s) please refer to documentation for further details.   After discussion with the patient he feels he would best function with 4 tablets daily which I think is reasonable we sent in 3 scripts he will need to do a follow-up in 3 months

## 2017-11-06 ENCOUNTER — Telehealth: Payer: Self-pay | Admitting: Family Medicine

## 2017-11-06 NOTE — Telephone Encounter (Signed)
Pt seen 10/16 for reflux and states it is a little worse now. Started on protonix 40mg  one daily. He states he has pain in chest and throat area. It gets a little better if he takes tums or the meds his stomach doctor gave him but he does not know what the med is.

## 2017-11-06 NOTE — Telephone Encounter (Signed)
Patient states medication (unkown) for heart burn is not working for him, he states he thinks heartburn has became worse, patient states he doesn't have to eat anything for heart burn to become present. Advise.  [id number] - , [place] [address]

## 2017-11-07 ENCOUNTER — Other Ambulatory Visit: Payer: Self-pay | Admitting: Family Medicine

## 2017-11-07 ENCOUNTER — Encounter: Payer: Self-pay | Admitting: Family Medicine

## 2017-11-07 ENCOUNTER — Ambulatory Visit: Payer: PPO | Admitting: Family Medicine

## 2017-11-07 MED ORDER — DEXLANSOPRAZOLE 60 MG PO CPDR
DELAYED_RELEASE_CAPSULE | ORAL | 4 refills | Status: DC
Start: 2017-11-07 — End: 2018-01-30

## 2017-11-07 NOTE — Telephone Encounter (Signed)
A letter was dictated please send this to the patient

## 2017-11-07 NOTE — Telephone Encounter (Signed)
I called left message to r/c. Medication has not been sent in as of yet.

## 2017-11-07 NOTE — Telephone Encounter (Signed)
So in this situation may try Dexilant 60 mg 1 daily, #30, 4 refills It is possible this may or may not be covered by insurance But certainly if the patient has ongoing symptoms with this we would recommend consultation with his gastroenterologist Dr. Amedeo Plenty I would also recommend a follow-up office visit with [place] if ongoing troubles so therefore if not improving over the next 1 to 2 weeks follow-up with [place]  Certainly if having other symptoms such as chest tightness pressure or discomfort with shortness of breath to be seen right away

## 2017-11-07 NOTE — Telephone Encounter (Signed)
Pt returned call. Pt informed about medication and informed we were not sure if insurance would pay for it or not. Sent medication in due to not knowing until pharmacist runs insurance through on how much it will cost. Pt also informed to follow up with [place] if ongoing troubles. Pt states that he has been here one time for this and "follows up are not doing no damn good". Pt hung up on nurse before rest of message could be read.

## 2017-11-19 ENCOUNTER — Other Ambulatory Visit (HOSPITAL_COMMUNITY): Payer: Self-pay | Admitting: Cardiology

## 2017-11-19 NOTE — Telephone Encounter (Signed)
Pt is 72 years old male who last saw Dr Curt Bears on 03/07/17. Last SCr on 07/30/17 was 0.96. Weight on 10/22/17 was 103Kg. Will refill Eliquis 5mg  BID.

## 2017-11-24 DIAGNOSIS — Z9841 Cataract extraction status, right eye: Secondary | ICD-10-CM | POA: Diagnosis not present

## 2017-11-24 DIAGNOSIS — H04123 Dry eye syndrome of bilateral lacrimal glands: Secondary | ICD-10-CM | POA: Diagnosis not present

## 2017-11-24 DIAGNOSIS — Z961 Presence of intraocular lens: Secondary | ICD-10-CM | POA: Diagnosis not present

## 2017-11-24 DIAGNOSIS — H43391 Other vitreous opacities, right eye: Secondary | ICD-10-CM | POA: Diagnosis not present

## 2017-12-15 ENCOUNTER — Encounter: Payer: Self-pay | Admitting: *Deleted

## 2017-12-18 DIAGNOSIS — Z23 Encounter for immunization: Secondary | ICD-10-CM | POA: Diagnosis not present

## 2017-12-18 DIAGNOSIS — L570 Actinic keratosis: Secondary | ICD-10-CM | POA: Diagnosis not present

## 2017-12-19 ENCOUNTER — Ambulatory Visit: Payer: PPO | Admitting: Cardiology

## 2017-12-19 ENCOUNTER — Encounter: Payer: Self-pay | Admitting: Cardiology

## 2017-12-19 VITALS — BP 108/64 | HR 83 | Ht 73.0 in | Wt 220.0 lb

## 2017-12-19 DIAGNOSIS — I4819 Other persistent atrial fibrillation: Secondary | ICD-10-CM | POA: Diagnosis not present

## 2017-12-19 DIAGNOSIS — I10 Essential (primary) hypertension: Secondary | ICD-10-CM | POA: Diagnosis not present

## 2017-12-19 NOTE — Progress Notes (Signed)
Electrophysiology Office Note   Date:  12/19/2017   ID:  Curtis [name], DOB December 26, 1945, MRN [id number]  PCP:  Kathyrn Drown, MD  Cardiologist:  Johnsie Cancel Primary Electrophysiologist:  Dr Curt Bears    CC: Follow up for atrial fibrillation   History of Present Illness: Curtis [name] is a 72 y.o. male who is being seen today for the evaluation of atrial fibillation at the request of Luking, Elayne Snare, MD. Presenting today for electrophysiology evaluation. He has a history of persistent atrial fibrillation status post Tikosyn loading, hypertension, hyperlipidemia, COPD and interstitial lung disease. His Tikosyn load was on 02/22/16. He was previously havin issues with fatigue, weakness, anorexia which he thought was due to his potassium, magnesium, and Tikosyn and these medications were stopped.  Patient reports that he "feels better now than he has the last four years." His dyspnea is stable (COPD) and he is asymptomatic of his atrial fibrillation. He was quite surprised that he was in afib today because he feels so well.  Today, denies symptoms of palpitations, chest pain, orthopnea, PND, lower extremity edema, claudication, dizziness, presyncope, syncope, bleeding, or neurologic sequela. The patient is tolerating medications without difficulties.   Past Medical History:  Diagnosis Date  . Arthritis   . Asthma   . Atrial fibrillation ([place])   . Cataract   . Cervical spondylosis   . Chest pain    Emergency room October 24, 2011, no MI  //   Nuclear, October, 2013, adenosine, EF 61%, no scar or ischemia  . Dyslipidemia    Statin intolerant  . Dyspnea   . GERD (gastroesophageal reflux disease)   . Glaucoma   . Hearing loss   . Hypertension   . Hypertension   . Idiopathic pulmonary fibrosis ([place])   . Interstitial pulmonary fibrosis ([place]) 2012  . Left shoulder pain 2011  . Myocardial infarction ([place])   . Psoriasis   . Pulmonary fibrosis ([place])   . Statin intolerance     Past Surgical History:  Procedure Laterality Date  . BRAIN SURGERY    . CARDIOVERSION N/A 12/13/2015   Procedure: CARDIOVERSION;  Surgeon: Josue Hector, MD;  Location: [place];  Service: Cardiovascular;  Laterality: N/A;  . CATARACT EXTRACTION W/PHACO Right 06/11/2012   Procedure: CATARACT EXTRACTION PHACO AND INTRAOCULAR LENS PLACEMENT ([place]);  Surgeon: Tonny Branch, MD;  Location: [facility];  Service: Ophthalmology;  Laterality: Right;  CDE:  14.43  . CATARACT EXTRACTION W/PHACO Left 06/29/2012   Procedure: CATARACT EXTRACTION PHACO AND INTRAOCULAR LENS PLACEMENT ([facility]);  Surgeon: Tonny Branch, MD;  Location: [facility];  Service: Ophthalmology;  Laterality: Left;  CDE:14.11  . CHOLECYSTECTOMY  05/08/2012  . CHOLECYSTECTOMY N/A 05/08/2012   Procedure: LAPAROSCOPIC CHOLECYSTECTOMY;  Surgeon: Harl Bowie, MD;  Location: [place];  Service: General;  Laterality: N/A;  . COLONOSCOPY    . LIVER BIOPSY N/A 05/08/2012   Procedure: LIVER BIOPSY;  Surgeon: Harl Bowie, MD;  Location: [place];  Service: General;  Laterality: N/A;  . NECK SURGERY     cervical disc with cliip  . SHOULDER ARTHROSCOPY WITH DISTAL CLAVICLE RESECTION Right 12/12/2016   Procedure: RIGHT SHOULDER ARTHROSCOPY, SUBACROMIAL DECOMPRESSION, DISTAL CLAVICLE RESECTION, WITH POSSIBLE MINI OPEN ROTATOR CUFF REPAIR;  Surgeon: [name], MD;  Location: [place];  Service: Orthopedics;  Laterality: Right;     Current Outpatient Medications  Medication Sig Dispense Refill  . albuterol (PROVENTIL HFA;VENTOLIN HFA) 108 (90 Base) MCG/ACT inhaler Inhale 2 puffs into the lungs every  6 (six) hours as needed for wheezing. 1 Inhaler 2  . ALPRAZolam (XANAX) 0.5 MG tablet TAKE 1/2 TO 1 TABLET BY MOUTH TWICE DAILY AS NEEDED 20 tablet 1  . CARTIA XT 120 MG 24 hr capsule TAKE 1 CAPSULE(120 MG) BY MOUTH DAILY 30 capsule 5  . ciprofloxacin (CIPRO) 500 MG tablet Take 1 tablet (500 mg total) by mouth 2 (two) times daily. 20 tablet 0  .  dexlansoprazole (DEXILANT) 60 MG capsule Take one capsule by mouth daily 30 capsule 4  . ELIQUIS 5 MG TABS tablet TAKE 1 TABLET BY MOUTH TWICE DAILY 60 tablet 4  . fexofenadine (ALLEGRA) 180 MG tablet Take 180 mg by mouth daily as needed for allergies or rhinitis.    . fluticasone (FLONASE) 50 MCG/ACT nasal spray Place 2 sprays into both nostrils daily. (Patient taking differently: Place 1 spray into both nostrils daily. ) 16 g 5  . HYDROcodone-acetaminophen (NORCO) 5-325 MG tablet One qid prn pain 120 tablet 0  . HYDROcodone-acetaminophen (NORCO/VICODIN) 5-325 MG tablet 1 qid prn pain 120 tablet 0  . HYDROcodone-acetaminophen (NORCO/VICODIN) 5-325 MG tablet 1 qid prn pain 120 tablet 0  . metroNIDAZOLE (FLAGYL) 500 MG tablet Take 1 tablet (500 mg total) by mouth 3 (three) times daily. 21 tablet 0  . omeprazole (PRILOSEC) 40 MG capsule Take 40 mg by mouth daily.     . OXYGEN Inhale 2.25 L into the lungs as needed (for shortness of breath).    . pantoprazole (PROTONIX) 40 MG tablet Take 1 tablet (40 mg total) by mouth daily. 30 tablet 3  . Polyethyl Glycol-Propyl Glycol (SYSTANE ULTRA OP) Place 1 drop into both eyes daily.     No current facility-administered medications for this visit.     Allergies:   Pirfenidone; Acyclovir and related; Oxycodone; Pravastatin; Simvastatin; Prednisone; and Tegretol [carbamazepine]   Social History:  The patient  reports that he quit smoking about 14 years ago. His smoking use included cigarettes. He has a 26.00 pack-year smoking history. He has never used smokeless tobacco. He reports that he does not drink alcohol or use drugs.   Family History:  The patient's family history includes Breast cancer in his sister; COPD in his mother; Congestive Heart Failure in his mother; Hypertension in his mother; Stroke in his father.   ROS:  Please see the history of present illness. All other systems are reviewed and negative.   PHYSICAL EXAM: VS:  BP 108/64   Pulse 83    Ht 6\' 1"  (1.854 m)   Wt 220 lb (99.8 kg)   SpO2 97%   BMI 29.03 kg/m  , BMI Body mass index is 29.03 kg/m. GEN: Well nourished, well developed, in no acute distress  HEENT: normal  Neck: no JVD, carotid bruits, or masses Cardiac: irregularly irregular; no murmurs, rubs, or gallops,no edema  Respiratory:  clear to auscultation bilaterally, normal work of breathing MS: no deformity or atrophy  Skin: warm and dry Neuro:  Strength and sensation are intact Psych: euthymic mood, full affect  EKG:  EKG is ordered today. Personal review of the ekg ordered shows atrial fibrrilation HR 83, QRS 86, QTc 462.  Recent Labs: 07/30/2017: ALT 12; BUN 9; Creatinine, Ser 0.96; Potassium 4.0; Sodium 143    Lipid Panel     Component Value Date/Time   CHOL 123 07/30/2017 0957   TRIG 119 07/30/2017 0957   HDL 22 (L) 07/30/2017 0957   CHOLHDL 5.6 (H) 07/30/2017 0957   CHOLHDL 4.4 06/13/2015  0728   VLDL 20 06/13/2015 0728   LDLCALC 77 07/30/2017 0957     Wt Readings from Last 3 Encounters:  12/19/17 220 lb (99.8 kg)  10/22/17 227 lb 0.4 oz (103 kg)  08/15/17 225 lb (102.1 kg)      Other studies Reviewed: Additional studies/ records that were reviewed today include: TTE 11/23/15  Review of the above records today demonstrates:  -Left ventricle: The cavity size was normal. Wall thickness was   normal. The estimated ejection fraction was 55%. Although no   diagnostic regional wall motion abnormality was identified, this   possibility cannot be completely excluded on the basis of this   study. - Aortic valve: Moderately calcified annulus. Trileaflet; mildly   calcified leaflets. Cusp separation was reduced. Aortic valve was   not completely interrogated. There looks to be mild aortic   stenosis. Valve area by planimetry 1.8 cm^2. - Mitral valve: Calcified annulus. Mildly thickened, mildly   calcified leaflets . There was mild regurgitation. - Left atrium: The atrium was moderately  dilated. - Right atrium: The atrium was mildly dilated. Central venous   pressure (est): 3 mm Hg. - Atrial septum: No defect or patent foramen ovale was identified. - Tricuspid valve: There was trivial regurgitation. - Pulmonary arteries: PA peak pressure: 30 mm Hg (S). - Pericardium, extracardiac: There was no pericardial effusion.   SPECT 02/05/16  There was no ST segment deviation noted during stress.  Defect 1: There is a small defect of mild severity present in the basal inferolateral and mid inferolateral location.  This is a low risk study.   Low risk stress nuclear study with a small fixed inferolateral defect. Ischemia is not seen.  ASSESSMENT AND PLAN:  1.  Permenent atrial fibrillation: He is on Eliquis 5mg  BID.  He was on Tikosyn in the past but was stopped due to anorexia.  He is in afib today. We spoke at length about afib and his options moving forward. His preference is for a rate control strategy at this point which I feel is reasonable given his drug intolerance and his lack of symptoms. We discussed that the longer he is in afib, the more difficult it would be to convert to SR, he voiced understanding. Continue diltiazem 120mg  daily Due to lung disease, would prefer to avoid amiodarone.  This patients CHA2DS2-VASc Score and unadjusted Ischemic Stroke Rate (% per year) is equal to 2.2 % stroke rate/year from a score of 2  Above score calculated as 1 point each if present [CHF, HTN, DM, Vascular=MI/PAD/Aortic Plaque, Age if 65-74, or Male] Above score calculated as 2 points each if present [Age > 75, or Stroke/TIA/TE]   2. Hypertension: Well controlled today, no changes.  Current medicines are reviewed at length with the patient today.   The patient does not have concerns regarding his medicines.  The following changes were made today: none  Labs/ tests ordered today include:  Orders Placed This Encounter  Procedures  . EKG 12-Lead     Disposition:   FU  with Afib clinic in 6 months  Signed, Atom Solivan Meredith Leeds, MD  12/19/2017 9:47 AM     [facility] [address] [place] [address] [phone] (office) [phone] (fax)  I have seen and examined this patient with Risky Fenton.  Agree with above, note added to reflect my findings.  On exam, iRRR, no murmurs, lungs clear.  Feeling well today.  He is in atrial fibrillation, but he is  not having any weakness, fatigue, or shortness of breath.  He is able to do all of his daily activities.  At this point, we Yonas Bunda not plan for cardioversion.  We Kenady Doxtater plan for rate control.  Zekiah Coen M. Mikenzie Mccannon MD 12/19/2017 9:47 AM

## 2017-12-19 NOTE — Patient Instructions (Signed)
Medication Instructions:  Your physician recommends that you continue on your current medications as directed. Please refer to the Current Medication list given to you today.  * If you need a refill on your cardiac medications before your next appointment, please call your pharmacy.   Labwork: None ordered  Testing/Procedures: None ordered  Follow-Up: Your physician wants you to follow-up in: 6 months with [place], PA in the AFib clinic. You will receive a reminder letter in the mail two months in advance. If you don't receive a letter, please call our office to schedule the follow-up appointment.   Thank you for choosing [facility]!!   Trinidad Curet, RN [phone]

## 2017-12-29 DIAGNOSIS — Z961 Presence of intraocular lens: Secondary | ICD-10-CM | POA: Diagnosis not present

## 2017-12-29 DIAGNOSIS — G500 Trigeminal neuralgia: Secondary | ICD-10-CM | POA: Diagnosis not present

## 2017-12-29 DIAGNOSIS — H02886 Meibomian gland dysfunction of left eye, unspecified eyelid: Secondary | ICD-10-CM | POA: Diagnosis not present

## 2017-12-29 DIAGNOSIS — H02883 Meibomian gland dysfunction of right eye, unspecified eyelid: Secondary | ICD-10-CM | POA: Diagnosis not present

## 2017-12-29 DIAGNOSIS — H40003 Preglaucoma, unspecified, bilateral: Secondary | ICD-10-CM | POA: Diagnosis not present

## 2017-12-29 DIAGNOSIS — Z8669 Personal history of other diseases of the nervous system and sense organs: Secondary | ICD-10-CM | POA: Diagnosis not present

## 2018-01-21 ENCOUNTER — Encounter: Payer: Self-pay | Admitting: Family Medicine

## 2018-01-21 ENCOUNTER — Ambulatory Visit (INDEPENDENT_AMBULATORY_CARE_PROVIDER_SITE_OTHER): Payer: PPO | Admitting: Family Medicine

## 2018-01-21 VITALS — BP 132/82 | Ht 73.0 in | Wt 227.2 lb

## 2018-01-21 DIAGNOSIS — I4819 Other persistent atrial fibrillation: Secondary | ICD-10-CM | POA: Diagnosis not present

## 2018-01-21 DIAGNOSIS — G8929 Other chronic pain: Secondary | ICD-10-CM | POA: Diagnosis not present

## 2018-01-21 DIAGNOSIS — J84112 Idiopathic pulmonary fibrosis: Secondary | ICD-10-CM

## 2018-01-21 DIAGNOSIS — M542 Cervicalgia: Secondary | ICD-10-CM | POA: Diagnosis not present

## 2018-01-21 MED ORDER — HYDROCODONE-ACETAMINOPHEN 5-325 MG PO TABS: ORAL_TABLET | ORAL | 0 refills | Status: DC

## 2018-01-21 NOTE — Progress Notes (Signed)
Subjective:    Patient ID: Curtis [name], male    DOB: Feb 15, 1945, 73 y.o.   MRN: [id number]  HPI  This patient was seen today for chronic pain  The medication list was reviewed and updated.   -Compliance with medication: yes  - Number patient states they take daily: 4  -when was the last dose patient took? today  The patient was advised the importance of maintaining medication and not using illegal substances with these.  Here for refills and follow up  The patient was educated that we can provide 3 monthly scripts for their medication, it is their responsibility to follow the instructions.  Side effects or complications from medications: none  Patient is aware that pain medications are meant to minimize the severity of the pain to allow their pain levels to improve to allow for better function. They are aware of that pain medications cannot totally remove their pain.  Due for UDT ( at least once per year) : last done 02/2017  Patient has atrial fibrillation and takes medication to control rate also takes anticoagulant patient states he finds himself feeling rundown a lot fatigued tired denies any chest tightness pressure pain  Does have chronic lung disease followed by a lung specialist states they work on a put him on a study protocol to help him he relates some increased difficulty with his breathing he is hopeful that study protocol can help him     Review of Systems  Constitutional: Negative for activity change, appetite change and fatigue.  HENT: Negative for congestion and rhinorrhea.   Respiratory: Negative for cough and shortness of breath.   Cardiovascular: Negative for chest pain and leg swelling.  Gastrointestinal: Negative for abdominal pain, nausea and vomiting.  Neurological: Negative for dizziness and headaches.  Psychiatric/Behavioral: Negative for agitation and behavioral problems.       Objective:   Physical Exam Constitutional:      General:  He is not in acute distress.    Appearance: He is well-developed.  HENT:     Head: Normocephalic.  Cardiovascular:     Rate and Rhythm: Normal rate and regular rhythm.     Heart sounds: Normal heart sounds. No murmur.  Pulmonary:     Effort: Pulmonary effort is normal.     Breath sounds: Normal breath sounds.  Skin:    General: Skin is warm and dry.  Neurological:     Mental Status: He is alert.  Psychiatric:        Behavior: Behavior normal.           Assessment & Plan:  The patient was seen in followup for chronic pain. A review over at their current pain status was discussed. Drug registry was checked. Prescriptions were given. Discussion was held regarding the importance of compliance with medication as well as pain medication contract.  Time for questions regarding pain management plan occurred. Importance of regular followup visits was discussed. Patient was informed that medication may cause drowsiness and should not be combined  with other medications/alcohol or street drugs. Patient was cautioned that medication could cause drowsiness. If the patient feels medication is causing altered alertness then do not drive or operate dangerous equipment.  Connect with Dr Elvina Sidle has underlying atrial fibrillation he states he has a lot of fatigue tiredness he states that he is interested in getting atrial fibrillation ablation if it is possible-we will connect with his cardiologist to see if they will connect with him to bring him in  to discuss this  Connect Pulmonary- patient states pulmonologist was noted put him on a study drug to help him with his lungs-he states that he feels he is having increased trouble with his breathing he is hopeful that they can put him on some to help this we will connect with his pulmonologist regarding this   25 minutes was spent with the patient.  This statement verifies that 25 minutes was indeed spent with the patient.  More than 50% of  this visit-total duration of the visit-was spent in counseling and coordination of care. The issues that the patient came in for today as reflected in the diagnosis (s) please refer to documentation for further details.

## 2018-01-22 ENCOUNTER — Ambulatory Visit: Payer: PPO | Admitting: Family Medicine

## 2018-01-30 ENCOUNTER — Other Ambulatory Visit: Payer: Self-pay | Admitting: Cardiology

## 2018-01-30 ENCOUNTER — Other Ambulatory Visit: Payer: Self-pay | Admitting: *Deleted

## 2018-01-30 ENCOUNTER — Ambulatory Visit: Payer: PPO | Admitting: Cardiology

## 2018-01-30 ENCOUNTER — Encounter: Payer: Self-pay | Admitting: Cardiology

## 2018-01-30 ENCOUNTER — Encounter: Payer: Self-pay | Admitting: *Deleted

## 2018-01-30 VITALS — BP 136/68 | HR 78 | Ht 73.0 in | Wt 230.0 lb

## 2018-01-30 DIAGNOSIS — I10 Essential (primary) hypertension: Secondary | ICD-10-CM | POA: Diagnosis not present

## 2018-01-30 DIAGNOSIS — I4819 Other persistent atrial fibrillation: Secondary | ICD-10-CM | POA: Diagnosis not present

## 2018-01-30 DIAGNOSIS — Z01812 Encounter for preprocedural laboratory examination: Secondary | ICD-10-CM | POA: Diagnosis not present

## 2018-01-30 DIAGNOSIS — R079 Chest pain, unspecified: Secondary | ICD-10-CM | POA: Diagnosis not present

## 2018-01-30 LAB — BASIC METABOLIC PANEL
BUN/Creatinine Ratio: 10 (ref 10–24)
BUN: 10 mg/dL (ref 8–27)
CO2: 25 mmol/L (ref 20–29)
Calcium: 8.9 mg/dL (ref 8.6–10.2)
Chloride: 102 mmol/L (ref 96–106)
Creatinine, Ser: 0.97 mg/dL (ref 0.76–1.27)
GFR calc Af Amer: 89 mL/min/{1.73_m2} (ref >59)
GFR, EST NON AFRICAN AMERICAN: 77 mL/min/{1.73_m2} (ref >59)
Glucose: 85 mg/dL (ref 65–99)
Potassium: 4.2 mmol/L (ref 3.5–5.2)
SODIUM: 141 mmol/L (ref 134–144)

## 2018-01-30 LAB — CBC
HEMATOCRIT: 44.4 % (ref 37.5–51.0)
HEMOGLOBIN: 15 g/dL (ref 13.0–17.7)
MCH: 30.9 pg (ref 26.6–33.0)
MCHC: 33.8 g/dL (ref 31.5–35.7)
MCV: 92 fL (ref 79–97)
Platelets: 142 10*3/uL — ABNORMAL LOW (ref 150–450)
RBC: 4.85 x10E6/uL (ref 4.14–5.80)
RDW: 13.7 % (ref 11.6–15.4)
WBC: 10.4 10*3/uL (ref 3.4–10.8)

## 2018-01-30 MED ORDER — METOPROLOL TARTRATE 100 MG PO TABS: 100.0000 mg | ORAL_TABLET | Freq: Once | ORAL | 0 refills | Status: DC

## 2018-01-30 NOTE — Addendum Note (Signed)
Addended by: Eulis Foster on: 01/30/2018 09:30 AM   Modules accepted: Orders

## 2018-01-30 NOTE — Patient Instructions (Signed)
Medication Instructions:  Your physician recommends that you continue on your current medications as directed. Please refer to the Current Medication list given to you today.  *If you need a refill on your cardiac medications before your next appointment, please call your pharmacy.  Labwork: You will get pre-procedure lab work today:  BMP & CBC *Will notify you of abnormal results, otherwise continue current treatment plan.  Testing/Procedures: Your physician has requested that you have cardiac CT within 7 days prior to your ablation. Cardiac computed tomography (CT) is a painless test that uses an x-ray machine to take clear, detailed pictures of your heart. For further information please visit [facility]. Please follow instruction below located under special instructions.  Your physician has recommended that you have an ablation. Catheter ablation is a medical procedure used to treat some cardiac arrhythmias (irregular heartbeats). During catheter ablation, a long, thin, flexible tube is put into a blood vessel in your groin (upper thigh), or neck. This tube is called an ablation catheter. It is then guided to your heart through the blood vessel. Radio frequency waves destroy small areas of heart tissue where abnormal heartbeats may cause an arrhythmia to start. Please see the instructions below located under special instructions  Follow-Up: Your physician recommends that you schedule a follow-up appointment in: 4 weeks, after your procedure on 02/18/18, with Roderic Palau NP in the AFib clinic.  Your physician recommends that you schedule a follow up appointment in: 3 months, after your procedure on 02/18/18, with Dr. Curt Bears.  *Please note that any paperwork needing to be filled out by the provider will need to be addressed at the front desk prior to seeing the provider. Please note that any FMLA, disability or other documents regarding health condition is subject to a $25.00 charge  that must be received prior to completion of paperwork in the form of a money order or check.  Thank you for choosing [facility]!! Trinidad Curet, RN [phone]   Any Other Special Instructions Will Be Listed Below      CARDIAC CT INSTRUCTIONS:  Please arrive at the [facility] main entrance of [facility] at __________ AM (30-45 minutes prior to test start time) [facility] [address] [place], [place] [address] [phone]  Proceed to the [facility] Radiology Department (First Floor).  Please follow these instructions carefully (unless otherwise directed):  Hold all erectile dysfunction medications at least 48 hours prior to test.  On the Night Before the Test: . Drink plenty of water. . Do not consume any caffeinated/decaffeinated beverages or chocolate 12 hours prior to your test. . Do not take any antihistamines 12 hours prior to your test.   On the Day of the Test: . Drink plenty of water. Do not drink any water within one hour of the test. . Do not eat any food 4 hours prior to the test. . You may take your regular medications prior to the test. . IF NOT ON A BETA BLOCKER - Take 100 mg of Lopressor (Metoprolol) one hour before the test.   . HOLD Furosemide morning of the test.  After the Test: . Drink plenty of water. . After receiving IV contrast, you may experience a mild flushed feeling. This is normal. . On occasion, you may experience a mild rash up to 24 hours after the test. This is not dangerous. If this occurs, you can take Benadryl 25 mg and increase your fluid intake. . If you experience trouble breathing,  this can be serious. If it is severe call 911 IMMEDIATELY. If it is mild, please call our office.    Instructions for your ablation: 1. Please arrive at the [facility], Main Entrance "A", of [facility] at 8:30 am on 02/18/18. 2. Do not eat or drink after midnight the night prior to the procedure. 3. Do not  miss any doses of ELIQUIS prior to the morning of the procedure.  4. Do not take any medications the morning of the procedure. 5. Both of your groins will need to be shaved for this procedure (if needed). We ask that you do this yourself at home 1-2 days prior to the procedure.  If you are unable/uncomfortable to do yourself, the hospital staff will shave you the day of your procedure (if needed). 6. Plan for an overnight stay in the hospital. 7. You will need someone to drive you home at discharge.

## 2018-01-30 NOTE — Progress Notes (Signed)
Electrophysiology Office Note   Date:  01/30/2018   ID:  Curtis [name], DOB [birth year], MRN [id number]  PCP:  Kathyrn Drown, MD  Cardiologist:  Johnsie Cancel Primary Electrophysiologist:  Dr Curt Bears    CC: Follow up for atrial fibrillation   History of Present Illness: Curtis [name] is a 73 y.o. male who is being seen today for the evaluation of atrial fibillation at the request of Luking, Elayne Snare, MD. Presenting today for electrophysiology evaluation. He has a history of persistent atrial fibrillation status post Tikosyn loading, hypertension, hyperlipidemia, COPD and interstitial lung disease. His Tikosyn load was on 02/22/16. He was previously havin issues with fatigue, weakness, anorexia which he thought was due to his potassium, magnesium, and Tikosyn and these medications were stopped.  Today, denies symptoms of PND, lower extremity edema, claudication, dizziness, presyncope, syncope, bleeding, or neurologic sequela. The patient is tolerating medications without difficulties.  Fortunately, he is quite a bit more weak and fatigued since I last saw him.  He also has significant shortness of breath.  He does get chest pain.  The pain is in the center of his chest and radiates to both of his arms.  He says that he is not able to do all of his daily activities due to his weakness and fatigue.  His chest pain does occur at multiple times during the day, not necessarily associated with exertion.  Does have coronary artery disease as seen on CT scanning for his lung disease in 2018.  Past Medical History:  Diagnosis Date  . Arthritis   . Asthma   . Atrial fibrillation ([place])   . Cataract   . Cervical spondylosis   . Chest pain    Emergency room October 24, 2011, no MI  //   Nuclear, October, 2013, adenosine, EF 61%, no scar or ischemia  . Dyslipidemia    Statin intolerant  . Dyspnea   . GERD (gastroesophageal reflux disease)   . Glaucoma   . Hearing loss   . Hypertension   .  Hypertension   . Idiopathic pulmonary fibrosis ([place])   . Interstitial pulmonary fibrosis ([place]) 2012  . Left shoulder pain 2011  . Myocardial infarction ([place])   . Psoriasis   . Pulmonary fibrosis ([place])   . Statin intolerance    Past Surgical History:  Procedure Laterality Date  . BRAIN SURGERY    . CARDIOVERSION N/A 12/13/2015   Procedure: CARDIOVERSION;  Surgeon: Josue Hector, MD;  Location: [facility];  Service: Cardiovascular;  Laterality: N/A;  . CATARACT EXTRACTION W/PHACO Right 06/11/2012   Procedure: CATARACT EXTRACTION PHACO AND INTRAOCULAR LENS PLACEMENT ([place]);  Surgeon: Tonny Branch, MD;  Location: [facility];  Service: Ophthalmology;  Laterality: Right;  CDE:  14.43  . CATARACT EXTRACTION W/PHACO Left 06/29/2012   Procedure: CATARACT EXTRACTION PHACO AND INTRAOCULAR LENS PLACEMENT ([facility]);  Surgeon: Tonny Branch, MD;  Location: [facility];  Service: Ophthalmology;  Laterality: Left;  CDE:14.11  . CHOLECYSTECTOMY  05/08/2012  . CHOLECYSTECTOMY N/A 05/08/2012   Procedure: LAPAROSCOPIC CHOLECYSTECTOMY;  Surgeon: Harl Bowie, MD;  Location: [facility];  Service: General;  Laterality: N/A;  . COLONOSCOPY    . LIVER BIOPSY N/A 05/08/2012   Procedure: LIVER BIOPSY;  Surgeon: Harl Bowie, MD;  Location: [facility];  Service: General;  Laterality: N/A;  . NECK SURGERY     cervical disc with cliip  . SHOULDER ARTHROSCOPY WITH DISTAL CLAVICLE RESECTION Right 12/12/2016   Procedure: RIGHT SHOULDER ARTHROSCOPY, SUBACROMIAL DECOMPRESSION, DISTAL CLAVICLE  RESECTION, WITH POSSIBLE MINI OPEN ROTATOR CUFF REPAIR;  Surgeon: [name], MD;  Location: [facility];  Service: Orthopedics;  Laterality: Right;     Current Outpatient Medications  Medication Sig Dispense Refill  . albuterol (PROVENTIL HFA;VENTOLIN HFA) 108 (90 Base) MCG/ACT inhaler Inhale 2 puffs into the lungs every 6 (six) hours as needed for wheezing. 1 Inhaler 2  . ALPRAZolam (XANAX) 0.5 MG tablet TAKE 1/2 TO 1 TABLET BY MOUTH TWICE DAILY AS  NEEDED 20 tablet 1  . CARTIA XT 120 MG 24 hr capsule TAKE 1 CAPSULE(120 MG) BY MOUTH DAILY 30 capsule 5  . doxycycline (VIBRAMYCIN) 50 MG capsule Take 50 mg by mouth daily.    Marland Kitchen ELIQUIS 5 MG TABS tablet TAKE 1 TABLET BY MOUTH TWICE DAILY 60 tablet 4  . fexofenadine (ALLEGRA) 180 MG tablet Take 180 mg by mouth daily as needed for allergies or rhinitis.    . fluticasone (FLONASE) 50 MCG/ACT nasal spray Place 2 sprays into both nostrils daily. (Patient taking differently: Place 1 spray into both nostrils daily. ) 16 g 5  . HYDROcodone-acetaminophen (NORCO/VICODIN) 5-325 MG tablet 1 qid prn pain 120 tablet 0  . omeprazole (PRILOSEC) 40 MG capsule Take 40 mg by mouth daily.     . OXYGEN Inhale 2.25 L into the lungs as needed (for shortness of breath).    Vladimir Faster Glycol-Propyl Glycol (SYSTANE ULTRA OP) Place 1 drop into both eyes daily.    . metoprolol tartrate (LOPRESSOR) 100 MG tablet Take 1 tablet (100 mg total) by mouth once for 1 dose. Take 2 hours prior to your CT 1 tablet 0   No current facility-administered medications for this visit.     Allergies:   Pirfenidone; Acyclovir and related; Oxycodone; Pravastatin; Simvastatin; Prednisone; and Tegretol [carbamazepine]   Social History:  The patient  reports that he quit smoking about 14 years ago. His smoking use included cigarettes. He has a 26.00 pack-year smoking history. He has never used smokeless tobacco. He reports that he does not drink alcohol or use drugs.   Family History:  The patient's family history includes Breast cancer in his sister; COPD in his mother; Congestive Heart Failure in his mother; Hypertension in his mother; Stroke in his father.   ROS:  Please see the history of present illness.   Otherwise, review of systems is positive for chest pain, leg swelling, shortness of breath, palpitations, hearing loss, visual changes, wheezing, nausea, anxiety, muscle pain, dizziness, easy bruising, headaches.   All other systems are  reviewed and negative.   PHYSICAL EXAM: VS:  BP 136/68   Pulse 78   Ht 6\' 1"  (1.854 m)   Wt 230 lb (104.3 kg)   BMI 30.34 kg/m  , BMI Body mass index is 30.34 kg/m. GEN: Well nourished, well developed, in no acute distress  HEENT: normal  Neck: no JVD, carotid bruits, or masses Cardiac: iRRR; no murmurs, rubs, or gallops,no edema  Respiratory:  clear to auscultation bilaterally, normal work of breathing GI: soft, nontender, nondistended, + BS MS: no deformity or atrophy  Skin: warm and dry Neuro:  Strength and sensation are intact Psych: euthymic mood, full affect  EKG:  EKG is ordered today. Personal review of the ekg ordered shows real fibrillation, rate 78  Recent Labs: 07/30/2017: ALT 12; BUN 9; Creatinine, Ser 0.96; Potassium 4.0; Sodium 143    Lipid Panel     Component Value Date/Time   CHOL 123 07/30/2017 0957   TRIG 119  07/30/2017 0957   HDL 22 (L) 07/30/2017 0957   CHOLHDL 5.6 (H) 07/30/2017 0957   CHOLHDL 4.4 06/13/2015 0728   VLDL 20 06/13/2015 0728   LDLCALC 77 07/30/2017 0957     Wt Readings from Last 3 Encounters:  01/30/18 230 lb (104.3 kg)  01/21/18 227 lb 3.2 oz (103.1 kg)  12/19/17 220 lb (99.8 kg)      Other studies Reviewed: Additional studies/ records that were reviewed today include: TTE 11/23/15  Review of the above records today demonstrates:  -Left ventricle: The cavity size was normal. Wall thickness was   normal. The estimated ejection fraction was 55%. Although no   diagnostic regional wall motion abnormality was identified, this   possibility cannot be completely excluded on the basis of this   study. - Aortic valve: Moderately calcified annulus. Trileaflet; mildly   calcified leaflets. Cusp separation was reduced. Aortic valve was   not completely interrogated. There looks to be mild aortic   stenosis. Valve area by planimetry 1.8 cm^2. - Mitral valve: Calcified annulus. Mildly thickened, mildly   calcified leaflets . There was  mild regurgitation. - Left atrium: The atrium was moderately dilated. - Right atrium: The atrium was mildly dilated. Central venous   pressure (est): 3 mm Hg. - Atrial septum: No defect or patent foramen ovale was identified. - Tricuspid valve: There was trivial regurgitation. - Pulmonary arteries: PA peak pressure: 30 mm Hg (S). - Pericardium, extracardiac: There was no pericardial effusion.   SPECT 02/05/16  There was no ST segment deviation noted during stress.  Defect 1: There is a small defect of mild severity present in the basal inferolateral and mid inferolateral location.  This is a low risk study.   Low risk stress nuclear study with a small fixed inferolateral defect. Ischemia is not seen.  ASSESSMENT AND PLAN:  1.  Persistent atrial fibrillation: Currently on Eliquis.  He was previously on Tikosyn but stopped it due to anorexia.  He was feeling well at his most recent visit and had chosen a rate control strategy, but he does come back in today with more symptoms of weakness, fatigue, shortness of breath, and chest pain.  He is interested in a rhythm control strategy at this time.  Would like to avoid amiodarone due to chronic lung issues.  He is not a candidate for class I due to coronary disease seen on CT scan.  We Airyana Sprunger thus plan for ablation.  Risks and benefits were discussed and include bleeding, tamponade, heart block, stroke, damage to surrounding organs.  He understands these risks and is agreed to the procedure.     This patients CHA2DS2-VASc Score and unadjusted Ischemic Stroke Rate (% per year) is equal to 2.2 % stroke rate/year from a score of 2  Above score calculated as 1 point each if present [CHF, HTN, DM, Vascular=MI/PAD/Aortic Plaque, Age if 65-74, or Male] Above score calculated as 2 points each if present [Age > 75, or Stroke/TIA/TE]  2. Hypertension: Mildly elevated today but has been well controlled in the past.  No changes.  3.  Chest pain: Does  have coronary artery disease seen on his CT scan 1 year ago.  Jaliah Foody look at his coronary arteries on his CT pre-AF ablation.  Current medicines are reviewed at length with the patient today.   The patient does not have concerns regarding his medicines.  The following changes were made today: none  Labs/ tests ordered today include:  Orders Placed This Encounter  Procedures  . CT CARDIAC MORPH/PULM VEIN W/CM&W/O CA SCORE  . CT CORONARY FRACTIONAL FLOW RESERVE DATA PREP  . CT CORONARY FRACTIONAL FLOW RESERVE FLUID ANALYSIS  . Basic metabolic panel  . CBC  . EKG 12-Lead     Disposition:   FU with Afib clinic in 6 months  Signed, Corsica Franson Meredith Leeds, MD  01/30/2018 9:11 AM     [facility] [address] [place] [address] [phone] (office) [phone] (fax)

## 2018-02-04 DIAGNOSIS — J8410 Pulmonary fibrosis, unspecified: Secondary | ICD-10-CM | POA: Diagnosis not present

## 2018-02-10 ENCOUNTER — Telehealth (HOSPITAL_COMMUNITY): Payer: Self-pay | Admitting: Emergency Medicine

## 2018-02-10 ENCOUNTER — Ambulatory Visit: Payer: PPO | Admitting: Cardiology

## 2018-02-10 NOTE — Telephone Encounter (Signed)
Left message on voicemail with name and callback number Keaden Gunnoe RN Navigator Cardiac Imaging [facility] [phone] Office [phone] Cell  

## 2018-02-11 DIAGNOSIS — H02883 Meibomian gland dysfunction of right eye, unspecified eyelid: Secondary | ICD-10-CM | POA: Diagnosis not present

## 2018-02-11 DIAGNOSIS — Z8669 Personal history of other diseases of the nervous system and sense organs: Secondary | ICD-10-CM | POA: Diagnosis not present

## 2018-02-11 DIAGNOSIS — H02886 Meibomian gland dysfunction of left eye, unspecified eyelid: Secondary | ICD-10-CM | POA: Diagnosis not present

## 2018-02-11 DIAGNOSIS — Z961 Presence of intraocular lens: Secondary | ICD-10-CM | POA: Diagnosis not present

## 2018-02-11 DIAGNOSIS — H40003 Preglaucoma, unspecified, bilateral: Secondary | ICD-10-CM | POA: Diagnosis not present

## 2018-02-11 DIAGNOSIS — G500 Trigeminal neuralgia: Secondary | ICD-10-CM | POA: Diagnosis not present

## 2018-02-12 ENCOUNTER — Ambulatory Visit (HOSPITAL_COMMUNITY)
Admission: RE | Admit: 2018-02-12 | Discharge: 2018-02-12 | Disposition: A | Discharge status: Home / Self Care | Payer: PPO | Source: Ambulatory Visit | Attending: Cardiology | Admitting: Cardiology

## 2018-02-12 ENCOUNTER — Ambulatory Visit (HOSPITAL_COMMUNITY): Admission: RE | Admit: 2018-02-12 | Payer: PPO | Source: Ambulatory Visit

## 2018-02-12 DIAGNOSIS — R079 Chest pain, unspecified: Secondary | ICD-10-CM | POA: Insufficient documentation

## 2018-02-12 DIAGNOSIS — I4819 Other persistent atrial fibrillation: Secondary | ICD-10-CM | POA: Diagnosis not present

## 2018-02-12 MED ORDER — IOPAMIDOL (ISOVUE-370) INJECTION 76%
80.0000 mL | Freq: Once | INTRAVENOUS | Status: AC | PRN
  Administered 2018-02-12: 80 mL via INTRAVENOUS

## 2018-02-18 ENCOUNTER — Encounter (HOSPITAL_COMMUNITY)
Admission: RE | Disposition: A | Discharge status: Home / Self Care | Payer: Self-pay | Source: Home / Self Care | Attending: Cardiology

## 2018-02-18 ENCOUNTER — Encounter (HOSPITAL_COMMUNITY): Payer: Self-pay | Admitting: Certified Registered Nurse Anesthetist

## 2018-02-18 ENCOUNTER — Ambulatory Visit (HOSPITAL_COMMUNITY): Payer: PPO | Admitting: Certified Registered Nurse Anesthetist

## 2018-02-18 ENCOUNTER — Ambulatory Visit (HOSPITAL_COMMUNITY)
Admission: RE | Admit: 2018-02-18 | Discharge: 2018-02-18 | Disposition: A | Discharge status: Home / Self Care | Payer: PPO | Attending: Cardiology | Admitting: Cardiology

## 2018-02-18 ENCOUNTER — Encounter (HOSPITAL_COMMUNITY): Payer: Self-pay | Admitting: *Deleted

## 2018-02-18 DIAGNOSIS — H409 Unspecified glaucoma: Secondary | ICD-10-CM | POA: Insufficient documentation

## 2018-02-18 DIAGNOSIS — Z823 Family history of stroke: Secondary | ICD-10-CM | POA: Diagnosis not present

## 2018-02-18 DIAGNOSIS — Z7901 Long term (current) use of anticoagulants: Secondary | ICD-10-CM | POA: Diagnosis not present

## 2018-02-18 DIAGNOSIS — K219 Gastro-esophageal reflux disease without esophagitis: Secondary | ICD-10-CM | POA: Diagnosis not present

## 2018-02-18 DIAGNOSIS — Z885 Allergy status to narcotic agent status: Secondary | ICD-10-CM | POA: Diagnosis not present

## 2018-02-18 DIAGNOSIS — J45909 Unspecified asthma, uncomplicated: Secondary | ICD-10-CM | POA: Insufficient documentation

## 2018-02-18 DIAGNOSIS — Z79899 Other long term (current) drug therapy: Secondary | ICD-10-CM | POA: Diagnosis not present

## 2018-02-18 DIAGNOSIS — Z888 Allergy status to other drugs, medicaments and biological substances status: Secondary | ICD-10-CM | POA: Insufficient documentation

## 2018-02-18 DIAGNOSIS — Z7951 Long term (current) use of inhaled steroids: Secondary | ICD-10-CM | POA: Insufficient documentation

## 2018-02-18 DIAGNOSIS — I4819 Other persistent atrial fibrillation: Secondary | ICD-10-CM | POA: Diagnosis not present

## 2018-02-18 DIAGNOSIS — Z87891 Personal history of nicotine dependence: Secondary | ICD-10-CM | POA: Insufficient documentation

## 2018-02-18 DIAGNOSIS — Z8249 Family history of ischemic heart disease and other diseases of the circulatory system: Secondary | ICD-10-CM | POA: Insufficient documentation

## 2018-02-18 DIAGNOSIS — I10 Essential (primary) hypertension: Secondary | ICD-10-CM | POA: Diagnosis not present

## 2018-02-18 DIAGNOSIS — Z825 Family history of asthma and other chronic lower respiratory diseases: Secondary | ICD-10-CM | POA: Insufficient documentation

## 2018-02-18 DIAGNOSIS — E785 Hyperlipidemia, unspecified: Secondary | ICD-10-CM | POA: Insufficient documentation

## 2018-02-18 DIAGNOSIS — I252 Old myocardial infarction: Secondary | ICD-10-CM | POA: Insufficient documentation

## 2018-02-18 DIAGNOSIS — H9190 Unspecified hearing loss, unspecified ear: Secondary | ICD-10-CM | POA: Diagnosis not present

## 2018-02-18 DIAGNOSIS — I4891 Unspecified atrial fibrillation: Secondary | ICD-10-CM | POA: Diagnosis not present

## 2018-02-18 HISTORY — PX: ATRIAL FIBRILLATION ABLATION: EP1191

## 2018-02-18 LAB — POCT ACTIVATED CLOTTING TIME
Activated Clotting Time: 367 seconds
Activated Clotting Time: 301 seconds
Activated Clotting Time: 169 seconds

## 2018-02-18 SURGERY — ATRIAL FIBRILLATION ABLATION
Anesthesia: General

## 2018-02-18 MED ORDER — BUPIVACAINE HCL (PF) 0.25 % IJ SOLN
INTRAMUSCULAR | Status: AC
  Filled 2018-02-18: qty 30

## 2018-02-18 MED ORDER — HEPARIN (PORCINE) IN NACL 1000-0.9 UT/500ML-% IV SOLN
INTRAVENOUS | Status: AC
  Filled 2018-02-18: qty 500

## 2018-02-18 MED ORDER — HEPARIN SODIUM (PORCINE) 1000 UNIT/ML IJ SOLN
INTRAMUSCULAR | Status: DC | PRN
  Administered 2018-02-18: 15000 [IU] via INTRAVENOUS
  Administered 2018-02-18: 2000 [IU] via INTRAVENOUS

## 2018-02-18 MED ORDER — PHENYLEPHRINE HCL 10 MG/ML IJ SOLN
INTRAMUSCULAR | Status: DC | PRN
  Administered 2018-02-18: 80 ug via INTRAVENOUS

## 2018-02-18 MED ORDER — FENTANYL CITRATE (PF) 100 MCG/2ML IJ SOLN
INTRAMUSCULAR | Status: AC
  Filled 2018-02-18: qty 2

## 2018-02-18 MED ORDER — HEPARIN (PORCINE) IN NACL 1000-0.9 UT/500ML-% IV SOLN
INTRAVENOUS | Status: DC | PRN
  Administered 2018-02-18 (×5): 500 mL

## 2018-02-18 MED ORDER — SODIUM CHLORIDE 0.9 % IV SOLN: 250.0000 mL | INTRAVENOUS | Status: DC | PRN

## 2018-02-18 MED ORDER — PROPOFOL 10 MG/ML IV BOLUS
INTRAVENOUS | Status: DC | PRN
  Administered 2018-02-18: 120 mg via INTRAVENOUS

## 2018-02-18 MED ORDER — DOBUTAMINE IN D5W 4-5 MG/ML-% IV SOLN
INTRAVENOUS | Status: DC | PRN
  Administered 2018-02-18: 20 ug/kg/min via INTRAVENOUS

## 2018-02-18 MED ORDER — DEXAMETHASONE SODIUM PHOSPHATE 10 MG/ML IJ SOLN
INTRAMUSCULAR | Status: DC | PRN
  Administered 2018-02-18: 10 mg via INTRAVENOUS

## 2018-02-18 MED ORDER — ONDANSETRON HCL 4 MG/2ML IJ SOLN
INTRAMUSCULAR | Status: DC | PRN
  Administered 2018-02-18: 4 mg via INTRAVENOUS

## 2018-02-18 MED ORDER — SUCCINYLCHOLINE CHLORIDE 20 MG/ML IJ SOLN
INTRAMUSCULAR | Status: DC | PRN
  Administered 2018-02-18: 120 mg via INTRAVENOUS

## 2018-02-18 MED ORDER — SODIUM CHLORIDE 0.9% FLUSH: 3.0000 mL | INTRAVENOUS | Status: DC | PRN

## 2018-02-18 MED ORDER — LIDOCAINE HCL (CARDIAC) PF 100 MG/5ML IV SOSY
PREFILLED_SYRINGE | INTRAVENOUS | Status: DC | PRN
  Administered 2018-02-18: 80 mg via INTRAVENOUS

## 2018-02-18 MED ORDER — SODIUM CHLORIDE 0.9 % IV SOLN
INTRAVENOUS | Status: DC
  Administered 2018-02-18 (×2): via INTRAVENOUS

## 2018-02-18 MED ORDER — SODIUM CHLORIDE 0.9% FLUSH: 3.0000 mL | Freq: Two times a day (BID) | INTRAVENOUS | Status: DC

## 2018-02-18 MED ORDER — BUPIVACAINE HCL (PF) 0.25 % IJ SOLN
INTRAMUSCULAR | Status: DC | PRN
  Administered 2018-02-18: 30 mL

## 2018-02-18 MED ORDER — HEPARIN SODIUM (PORCINE) 1000 UNIT/ML IJ SOLN
INTRAMUSCULAR | Status: DC | PRN
  Administered 2018-02-18: 1000 [IU] via INTRAVENOUS

## 2018-02-18 MED ORDER — ACETAMINOPHEN 325 MG PO TABS
650.0000 mg | ORAL_TABLET | ORAL | Status: DC | PRN
  Filled 2018-02-18: qty 2

## 2018-02-18 MED ORDER — FENTANYL CITRATE (PF) 250 MCG/5ML IJ SOLN
INTRAMUSCULAR | Status: DC | PRN
  Administered 2018-02-18: 100 ug via INTRAVENOUS

## 2018-02-18 MED ORDER — MIDAZOLAM HCL 5 MG/5ML IJ SOLN
INTRAMUSCULAR | Status: DC | PRN
  Administered 2018-02-18: 1 mg via INTRAVENOUS

## 2018-02-18 MED ORDER — PROTAMINE SULFATE 10 MG/ML IV SOLN
INTRAVENOUS | Status: DC | PRN
  Administered 2018-02-18: 50 mg via INTRAVENOUS

## 2018-02-18 MED ORDER — ONDANSETRON HCL 4 MG/2ML IJ SOLN: 4.0000 mg | Freq: Four times a day (QID) | INTRAMUSCULAR | Status: DC | PRN

## 2018-02-18 MED ORDER — ROCURONIUM BROMIDE 100 MG/10ML IV SOLN
INTRAVENOUS | Status: DC | PRN
  Administered 2018-02-18: 20 mg via INTRAVENOUS
  Administered 2018-02-18: 50 mg via INTRAVENOUS

## 2018-02-18 MED ORDER — HEPARIN SODIUM (PORCINE) 1000 UNIT/ML IJ SOLN
INTRAMUSCULAR | Status: AC
  Filled 2018-02-18: qty 1

## 2018-02-18 MED ORDER — HYDROMORPHONE HCL 1 MG/ML IJ SOLN: 0.2500 mg | INTRAMUSCULAR | Status: DC | PRN

## 2018-02-18 MED ORDER — SUGAMMADEX SODIUM 200 MG/2ML IV SOLN
INTRAVENOUS | Status: DC | PRN
  Administered 2018-02-18: 200 mg via INTRAVENOUS

## 2018-02-18 MED ORDER — SODIUM CHLORIDE 0.9 % IV SOLN
INTRAVENOUS | Status: DC | PRN
  Administered 2018-02-18: 30 ug/min via INTRAVENOUS

## 2018-02-18 MED ORDER — FENTANYL CITRATE (PF) 100 MCG/2ML IJ SOLN
25.0000 ug | Freq: Once | INTRAMUSCULAR | Status: AC
  Administered 2018-02-18: 25 ug via INTRAVENOUS

## 2018-02-18 MED ORDER — DOBUTAMINE IN D5W 4-5 MG/ML-% IV SOLN
INTRAVENOUS | Status: AC
  Filled 2018-02-18: qty 250

## 2018-02-18 SURGICAL SUPPLY — 20 items
BLANKET WARM UNDERBOD FULL ACC (MISCELLANEOUS) ×3 IMPLANT
CATH MAPPNG PENTARAY F 2-6-2MM (CATHETERS) ×1 IMPLANT
CATH SMTCH THERMOCOOL SF DF (CATHETERS) ×3 IMPLANT
CATH SOUNDSTAR 3D IMAGING (CATHETERS) ×3 IMPLANT
CATH WEBSTER BI DIR CS D-F CRV (CATHETERS) ×3 IMPLANT
COVER SWIFTLINK CONNECTOR (BAG) ×3 IMPLANT
PACK EP LATEX FREE (CUSTOM PROCEDURE TRAY) ×2
PACK EP LF (CUSTOM PROCEDURE TRAY) ×1 IMPLANT
PAD PRO RADIOLUCENT 2001M-C (PAD) ×3 IMPLANT
PATCH CARTO3 (PAD) ×3 IMPLANT
PENTARAY F 2-6-2MM (CATHETERS) ×3
SHEATH AVANTI 11F 11CM (SHEATH) ×3 IMPLANT
SHEATH BAYLIS SUREFLEX  M 8.5 (SHEATH) ×2
SHEATH BAYLIS SUREFLEX M 8.5 (SHEATH) ×1 IMPLANT
SHEATH BAYLIS TRANSSEPTAL 98CM (NEEDLE) ×3 IMPLANT
SHEATH CARTO VIZIGO SM CVD (SHEATH) ×3 IMPLANT
SHEATH PINNACLE 7F 10CM (SHEATH) ×3 IMPLANT
SHEATH PINNACLE 8F 10CM (SHEATH) ×6 IMPLANT
SHEATH PINNACLE 9F 10CM (SHEATH) ×6 IMPLANT
TUBING SMART ABLATE COOLFLOW (TUBING) ×3 IMPLANT

## 2018-02-18 NOTE — Progress Notes (Signed)
No bleeding or swelling noted after ambulation 

## 2018-02-18 NOTE — Transfer of Care (Signed)
Immediate Anesthesia Transfer of Care Note  Patient: Curtis [name]  Procedure(s) Performed: ATRIAL FIBRILLATION ABLATION (N/A )  Patient Location: Cath Lab  Anesthesia Type:General  Level of Consciousness: awake, alert , oriented and patient cooperative  Airway & Oxygen Therapy: Patient Spontanous Breathing and Patient connected to nasal cannula oxygen  Post-op Assessment: Report given to RN and Post -op Vital signs reviewed and stable  Post vital signs: Reviewed and stable  Last Vitals:  Vitals Value Taken Time  BP 127/79 02/18/2018  1:38 PM  Temp    Pulse 74 02/18/2018  1:42 PM  Resp 16 02/18/2018  1:42 PM  SpO2 99 % 02/18/2018  1:42 PM  Vitals shown include unvalidated device data.  Last Pain: There were no vitals filed for this visit.       Complications: No apparent anesthesia complications

## 2018-02-18 NOTE — Anesthesia Preprocedure Evaluation (Signed)
Anesthesia Evaluation  Patient identified by MRN, date of birth, ID band Patient awake    Reviewed: Allergy & Precautions, NPO status , Patient's Chart, lab work & pertinent test results  Airway Mallampati: II  TM Distance: >3 FB Neck ROM: Full    Dental no notable dental hx.    Pulmonary asthma , former smoker,  Idiopathic pulmonary fibrosis  Oxygen dependent- on 2L  Sees pulmonologist    Pulmonary exam normal breath sounds clear to auscultation       Cardiovascular hypertension, Normal cardiovascular exam+ dysrhythmias Atrial Fibrillation  Rhythm:Regular Rate:Normal  ECG: SR, occ PVC's. Rate 65  Sees cardiologist ([place])   Neuro/Psych PSYCHIATRIC DISORDERS negative neurological ROS     GI/Hepatic Neg liver ROS, GERD  Medicated,  Endo/Other  negative endocrine ROS  Renal/GU negative Renal ROS     Musculoskeletal  (+) Arthritis , Osteoarthritis,  Impingement Syndrome, Torn Rotator Cuff, AC Arthritis Right Shoulder   Abdominal   Peds  Hematology negative hematology ROS (+)   Anesthesia Other Findings Dyslipidemia  Reproductive/Obstetrics                             Anesthesia Physical  Anesthesia Plan  ASA: III  Anesthesia Plan: General   Post-op Pain Management:    Induction: Intravenous  PONV Risk Score and Plan: 2 and Ondansetron, Midazolam and Treatment may vary due to age or medical condition  Airway Management Planned: Oral ETT  Additional Equipment:   Intra-op Plan:   Post-operative Plan: Extubation in OR  Informed Consent: I have reviewed the patients History and Physical, chart, labs and discussed the procedure including the risks, benefits and alternatives for the proposed anesthesia with the patient or authorized representative who has indicated his/her understanding and acceptance.     Dental advisory given  Plan Discussed with: CRNA  Anesthesia Plan  Comments:         Anesthesia Quick Evaluation

## 2018-02-18 NOTE — H&P (Signed)
Curtis [name] has presented today for surgery, with the diagnosis of atrial fibrillation.  The various methods of treatment have been discussed with the patient and family. After consideration of risks, benefits and other options for treatment, the patient has consented to  Procedure(s): Catheter ablation as a surgical intervention .  Risks include but not limited to bleeding, tamponade, heart block, stroke, damage to surrounding organs, among others. The patient's history has been reviewed, patient examined, no change in status, stable for surgery.  I have reviewed the patient's chart and labs.  Questions were answered to the patient's satisfaction.    Jeffrey Graefe Curt Bears, MD 02/18/2018 8:25 AM

## 2018-02-18 NOTE — Discharge Instructions (Signed)
Post procedure care instructions No driving for 4 days. No lifting over 5 lbs for 1 week. No vigorous or sexual activity for 1 week. You may return to work on 02/25/2018. Keep procedure site clean & dry. If you notice increased pain, swelling, bleeding or pus, call/return!  You may shower, but no soaking baths/hot tubs/pools for 1 week.    You have an appointment set up with the [facility].  Multiple studies have shown that being followed by a dedicated atrial fibrillation clinic in addition to the standard care you receive from your other physicians improves health. We believe that enrollment in the atrial fibrillation clinic will allow [place] to better care for you.   The phone number to the [facility] is [phone]. The clinic is staffed Monday through Friday from 8:30am to 5pm.  Parking Directions: The clinic is located in the Heart and Vascular Building connected to [facility]. 1)From [address] turn on to [address] and go to the 3rd entrance  (Heart and Vascular entrance) on the right. 2)Look to the right for Heart &Vascular Parking Garage. 3)A code for the entrance is required please call the clinic to receive this.   4)Take the elevators to the 1st floor. Registration is in the room with the glass walls at the end of the hallway.  If you have any trouble parking or locating the clinic, please dont hesitate to call [phone].   Cardiac Ablation, Care After This sheet gives you information about how to care for yourself after your procedure. Your health care provider may also give you more specific instructions. If you have problems or questions, contact your health care provider. What can I expect after the procedure? After the procedure, it is common to have:  Bruising around your puncture site.  Tenderness around your puncture site.  Skipped heartbeats.  Tiredness (fatigue). Follow these instructions at home: Puncture site  care   Follow instructions from your health care provider about how to take care of your puncture site. Make sure you: ? Wash your hands with soap and water before you change your bandage (dressing). If soap and water are not available, use hand sanitizer. ? Change your dressing as told by your health care provider. ? Leave stitches (sutures), skin glue, or adhesive strips in place. These skin closures may need to stay in place for up to 2 weeks. If adhesive strip edges start to loosen and curl up, you may trim the loose edges. Do not remove adhesive strips completely unless your health care provider tells you to do that.  Check your puncture site every day for signs of infection. Check for: ? Redness, swelling, or pain. ? Fluid or blood. If your puncture site starts to bleed, lie down on your back, apply firm pressure to the area, and contact your health care provider. ? Warmth. ? Pus or a bad smell. Driving  Ask your health care provider when it is safe for you to drive again after the procedure.  Do not drive or use heavy machinery while taking prescription pain medicine.  Do not drive for 24 hours if you were given a medicine to help you relax (sedative) during your procedure. Activity  Avoid activities that take a lot of effort for at least 3 days after your procedure.  Do not lift anything that is heavier than 10 lb (4.5 kg), or the limit that you are told, until your health care provider says that it is safe.  Return  to your normal activities as told by your health care provider. Ask your health care provider what activities are safe for you. General instructions  Take over-the-counter and prescription medicines only as told by your health care provider.  Do not use any products that contain nicotine or tobacco, such as cigarettes and e-cigarettes. If you need help quitting, ask your health care provider.  Do not take baths, swim, or use a hot tub until your health care  provider approves.  Do not drink alcohol for 24 hours after your procedure.  Keep all follow-up visits as told by your health care provider. This is important. Contact a health care provider if:  You have redness, mild swelling, or pain around your puncture site.  You have fluid or blood coming from your puncture site that stops after applying firm pressure to the area.  Your puncture site feels warm to the touch.  You have pus or a bad smell coming from your puncture site.  You have a fever.  You have chest pain or discomfort that spreads to your neck, jaw, or arm.  You are sweating a lot.  You feel nauseous.  You have a fast or irregular heartbeat.  You have shortness of breath.  You are dizzy or light-headed and feel the need to lie down.  You have pain or numbness in the arm or leg closest to your puncture site. Get help right away if:  Your puncture site suddenly swells.  Your puncture site is bleeding and the bleeding does not stop after applying firm pressure to the area. These symptoms may represent a serious problem that is an emergency. Do not wait to see if the symptoms will go away. Get medical help right away. Call your local emergency services (911 in the [place] Do not drive yourself to the hospital. Summary  After the procedure, it is normal to have bruising and tenderness at the puncture site in your groin, neck, or forearm.  Check your puncture site every day for signs of infection.  Get help right away if your puncture site is bleeding and the bleeding does not stop after applying firm pressure to the area. This is a medical emergency. This information is not intended to replace advice given to you by your health care provider. Make sure you discuss any questions you have with your health care provider. Document Released: 04/04/2016 Document Revised: 04/04/2016 Document Reviewed: 04/04/2016 Elsevier Interactive Patient Education  2019 [employer].

## 2018-02-18 NOTE — Anesthesia Procedure Notes (Signed)
Procedure Name: Intubation Date/Time: 02/18/2018 11:12 AM Performed by: Shirlyn Goltz, CRNA Pre-anesthesia Checklist: Patient identified, Emergency Drugs available, Suction available and Patient being monitored Patient Re-evaluated:Patient Re-evaluated prior to induction Oxygen Delivery Method: Circle system utilized Preoxygenation: Pre-oxygenation with 100% oxygen Induction Type: IV induction Ventilation: Mask ventilation without difficulty Laryngoscope Size: Mac and 4 Grade View: Grade II Tube type: Oral Tube size: 7.5 mm Number of attempts: 1 Airway Equipment and Method: Stylet Placement Confirmation: ETT inserted through vocal cords under direct vision,  positive ETCO2 and breath sounds checked- equal and bilateral Secured at: 23 cm Tube secured with: Tape Dental Injury: Teeth and Oropharynx as per pre-operative assessment

## 2018-02-18 NOTE — Progress Notes (Signed)
Site area: Right groin a 9 french X2 venous sheath was removed  Site Prior to Removal:  Level 0  Pressure Applied For 20 MINUTES    Bedrest Beginning at 1430p  Manual:   Yes.    Patient Status During Pull:  stable  Post Pull Groin Site:  Level 0  Post Pull Instructions Given:  Yes.    Post Pull Pulses Present:  Yes.    Dressing Applied:  Yes.    Comments:  VS remain stable

## 2018-02-18 NOTE — Progress Notes (Signed)
Site area: Left groin a 7 and 11 french venous sheath eas removed by Tammy Mink [facility]IS  Site Prior to Removal:  Level 0  Pressure Applied For 20 MINUTES    Bedrest Beginning at 1430p  Manual:   Yes.    Patient Status During Pull:  stable  Post Pull Groin Site:  Level 0  Post Pull Instructions Given:  Yes.    Post Pull Pulses Present:  Yes.    Dressing Applied:  Yes.    Comments:  VS remain stable

## 2018-02-19 ENCOUNTER — Encounter (HOSPITAL_COMMUNITY): Payer: Self-pay | Admitting: Cardiology

## 2018-02-19 NOTE — Anesthesia Postprocedure Evaluation (Signed)
Anesthesia Post Note  Patient: Curtis [name]  Procedure(s) Performed: ATRIAL FIBRILLATION ABLATION (N/A )     Patient location during evaluation: PACU Anesthesia Type: General Level of consciousness: awake and alert Pain management: pain level controlled Vital Signs Assessment: post-procedure vital signs reviewed and stable Respiratory status: spontaneous breathing, nonlabored ventilation and respiratory function stable Cardiovascular status: blood pressure returned to baseline and stable Postop Assessment: no apparent nausea or vomiting Anesthetic complications: no    Last Vitals:  Vitals:   02/18/18 1915 02/18/18 2000  BP: 137/79 132/65  Pulse: 81 88  Resp: 20 (!) 21  Temp:    SpO2: 96% 97%    Last Pain:  Vitals:   02/18/18 2000  TempSrc:   PainSc: 0-No pain                 Lynda Rainwater

## 2018-02-24 ENCOUNTER — Other Ambulatory Visit: Payer: Self-pay | Admitting: Family Medicine

## 2018-03-18 ENCOUNTER — Other Ambulatory Visit: Payer: Self-pay

## 2018-03-18 ENCOUNTER — Ambulatory Visit (HOSPITAL_COMMUNITY)
Admission: RE | Admit: 2018-03-18 | Discharge: 2018-03-18 | Disposition: A | Discharge status: Home / Self Care | Payer: PPO | Source: Ambulatory Visit | Attending: Nurse Practitioner | Admitting: Nurse Practitioner

## 2018-03-18 VITALS — BP 112/72 | HR 86 | Ht 73.0 in | Wt 228.0 lb

## 2018-03-18 DIAGNOSIS — Z87891 Personal history of nicotine dependence: Secondary | ICD-10-CM | POA: Insufficient documentation

## 2018-03-18 DIAGNOSIS — Z836 Family history of other diseases of the respiratory system: Secondary | ICD-10-CM | POA: Insufficient documentation

## 2018-03-18 DIAGNOSIS — Z882 Allergy status to sulfonamides status: Secondary | ICD-10-CM | POA: Insufficient documentation

## 2018-03-18 DIAGNOSIS — I4819 Other persistent atrial fibrillation: Secondary | ICD-10-CM | POA: Diagnosis not present

## 2018-03-18 DIAGNOSIS — Z883 Allergy status to other anti-infective agents status: Secondary | ICD-10-CM | POA: Insufficient documentation

## 2018-03-18 DIAGNOSIS — Z7901 Long term (current) use of anticoagulants: Secondary | ICD-10-CM | POA: Diagnosis not present

## 2018-03-18 DIAGNOSIS — I10 Essential (primary) hypertension: Secondary | ICD-10-CM | POA: Diagnosis not present

## 2018-03-18 DIAGNOSIS — Z9049 Acquired absence of other specified parts of digestive tract: Secondary | ICD-10-CM | POA: Diagnosis not present

## 2018-03-18 DIAGNOSIS — H409 Unspecified glaucoma: Secondary | ICD-10-CM | POA: Insufficient documentation

## 2018-03-18 DIAGNOSIS — J449 Chronic obstructive pulmonary disease, unspecified: Secondary | ICD-10-CM | POA: Insufficient documentation

## 2018-03-18 DIAGNOSIS — Z8249 Family history of ischemic heart disease and other diseases of the circulatory system: Secondary | ICD-10-CM | POA: Diagnosis not present

## 2018-03-18 DIAGNOSIS — Z886 Allergy status to analgesic agent status: Secondary | ICD-10-CM | POA: Diagnosis not present

## 2018-03-18 DIAGNOSIS — J45909 Unspecified asthma, uncomplicated: Secondary | ICD-10-CM | POA: Insufficient documentation

## 2018-03-18 DIAGNOSIS — Z79899 Other long term (current) drug therapy: Secondary | ICD-10-CM | POA: Diagnosis not present

## 2018-03-18 DIAGNOSIS — Z803 Family history of malignant neoplasm of breast: Secondary | ICD-10-CM | POA: Diagnosis not present

## 2018-03-18 DIAGNOSIS — I252 Old myocardial infarction: Secondary | ICD-10-CM | POA: Diagnosis not present

## 2018-03-18 DIAGNOSIS — Z888 Allergy status to other drugs, medicaments and biological substances status: Secondary | ICD-10-CM | POA: Diagnosis not present

## 2018-03-18 DIAGNOSIS — K219 Gastro-esophageal reflux disease without esophagitis: Secondary | ICD-10-CM | POA: Diagnosis not present

## 2018-03-18 NOTE — Progress Notes (Signed)
Primary Care Physician: Kathyrn Drown, MD Primary Cardiologist: Dr Johnsie Cancel Primary Electrophysiologist: Dr Curt Bears Referring Physician: Dr Jerolyn Center Callicott is a 73 y.o. male with a history of persistent atrial fibrillation, HTN, HLD, COPD, and interstitial lung disease who presents for follow up in the [facility]. His Tikosyn load was on 02/22/16. He was previously havin issues with fatigue, weakness, anorexia which he thought was due to his potassium, magnesium, and Tikosyn and these medications were stopped. He is now s/p afib ablation with Dr Curt Bears and he is feeling very well. He does report that some days he has more energy than others. Overall, he has more energy since the ablation. He denies swallowing or groin issues. No missed doses of anticoagulation.   Today, he denies symptoms of palpitations, chest pain, shortness of breath, orthopnea, PND, lower extremity edema, dizziness, presyncope, syncope, snoring, daytime somnolence, bleeding, or neurologic sequela. The patient is tolerating medications without difficulties and is otherwise without complaint today.    Atrial Fibrillation Risk Factors:  he does not have symptoms or diagnosis of sleep apnea. he does not have a history of rheumatic fever. he does not have a history of alcohol use. The patient does not have a history of early familial atrial fibrillation or other arrhythmias.  he has a BMI of Body mass index is 30.08 kg/m.Marland Kitchen Filed Weights   03/18/18 0855  Weight: 103.4 kg    Family History  Problem Relation Age of Onset  . Hypertension Mother   . Congestive Heart Failure Mother   . COPD Mother   . Stroke Father   . Breast cancer Sister      Atrial Fibrillation Management history:  Previous antiarrhythmic drugs: Tikosyn Previous cardioversions: 12/13/15 Previous ablations: 02/18/18 CHADS2VASC score: 2 Anticoagulation history: Eliquis   Past Medical History:   Diagnosis Date  . Arthritis   . Asthma   . Atrial fibrillation ([place])   . Cataract   . Cervical spondylosis   . Chest pain    Emergency room October 24, 2011, no MI  //   Nuclear, October, 2013, adenosine, EF 61%, no scar or ischemia  . Dyslipidemia    Statin intolerant  . Dyspnea   . GERD (gastroesophageal reflux disease)   . Glaucoma   . Hearing loss   . Hypertension   . Hypertension   . Idiopathic pulmonary fibrosis ([place])   . Interstitial pulmonary fibrosis ([place]) 2012  . Left shoulder pain 2011  . Myocardial infarction ([place])   . Psoriasis   . Pulmonary fibrosis ([place])   . Statin intolerance    Past Surgical History:  Procedure Laterality Date  . ATRIAL FIBRILLATION ABLATION N/A 02/18/2018   Procedure: ATRIAL FIBRILLATION ABLATION;  Surgeon: Constance Haw, MD;  Location: [facility];  Service: Cardiovascular;  Laterality: N/A;  . BRAIN SURGERY    . CARDIOVERSION N/A 12/13/2015   Procedure: CARDIOVERSION;  Surgeon: Josue Hector, MD;  Location: [facility];  Service: Cardiovascular;  Laterality: N/A;  . CATARACT EXTRACTION W/PHACO Right 06/11/2012   Procedure: CATARACT EXTRACTION PHACO AND INTRAOCULAR LENS PLACEMENT ([place]);  Surgeon: Tonny Branch, MD;  Location: [facility];  Service: Ophthalmology;  Laterality: Right;  CDE:  14.43  . CATARACT EXTRACTION W/PHACO Left 06/29/2012   Procedure: CATARACT EXTRACTION PHACO AND INTRAOCULAR LENS PLACEMENT (IOC);  Surgeon: Tonny Branch, MD;  Location: [facility];  Service: Ophthalmology;  Laterality: Left;  CDE:14.11  . CHOLECYSTECTOMY  05/08/2012  . CHOLECYSTECTOMY N/A  05/08/2012   Procedure: LAPAROSCOPIC CHOLECYSTECTOMY;  Surgeon: Harl Bowie, MD;  Location: [facility];  Service: General;  Laterality: N/A;  . COLONOSCOPY    . LIVER BIOPSY N/A 05/08/2012   Procedure: LIVER BIOPSY;  Surgeon: Harl Bowie, MD;  Location: [facility];  Service: General;  Laterality: N/A;  . NECK SURGERY     cervical disc with cliip  . SHOULDER ARTHROSCOPY  WITH DISTAL CLAVICLE RESECTION Right 12/12/2016   Procedure: RIGHT SHOULDER ARTHROSCOPY, SUBACROMIAL DECOMPRESSION, DISTAL CLAVICLE RESECTION, WITH POSSIBLE MINI OPEN ROTATOR CUFF REPAIR;  Surgeon: [name], MD;  Location: [facility];  Service: Orthopedics;  Laterality: Right;    Current Outpatient Medications  Medication Sig Dispense Refill  . albuterol (PROVENTIL HFA;VENTOLIN HFA) 108 (90 Base) MCG/ACT inhaler Inhale 2 puffs into the lungs every 6 (six) hours as needed for wheezing. 1 Inhaler 2  . ALPRAZolam (XANAX) 0.5 MG tablet Take 0.5-1 tablets (0.25-0.5 mg total) by mouth 2 (two) times daily as needed for anxiety or sleep. 20 tablet 1  . CARTIA XT 120 MG 24 hr capsule TAKE 1 CAPSULE(120 MG) BY MOUTH DAILY (Patient taking differently: Take 120 mg by mouth daily. ) 30 capsule 5  . ELIQUIS 5 MG TABS tablet TAKE 1 TABLET BY MOUTH TWICE DAILY (Patient taking differently: Take 5 mg by mouth 2 (two) times daily. ) 60 tablet 4  . fexofenadine (ALLEGRA) 180 MG tablet Take 180 mg by mouth daily as needed for allergies or rhinitis.    . fluticasone (FLONASE) 50 MCG/ACT nasal spray Place 2 sprays into both nostrils daily. (Patient taking differently: Place 1 spray into both nostrils daily. ) 16 g 5  . HYDROcodone-acetaminophen (NORCO/VICODIN) 5-325 MG tablet 1 qid prn pain (Patient taking differently: Take 1 tablet by mouth 4 (four) times daily as needed for moderate pain. ) 120 tablet 0  . omeprazole (PRILOSEC) 40 MG capsule Take 40 mg by mouth daily.     . OXYGEN Inhale 2.25 L into the lungs as needed (for shortness of breath).    Vladimir Faster Glycol-Propyl Glycol (SYSTANE ULTRA OP) Place 1 drop into both eyes daily.     No current facility-administered medications for this encounter.     Allergies  Allergen Reactions  . Pirfenidone Other (See Comments)    Elevated bilirubin  . Acyclovir And Related Other (See Comments)    Unknown  . Oxycodone Itching  . Pravastatin Other (See Comments)     Chills    . Simvastatin Other (See Comments)    Chills   Other reaction(s): Other (See Comments) Leg pain  . Prednisone Anxiety and Other (See Comments)    Irritation Other reaction(s): Other (See Comments) irritability  . Tegretol [Carbamazepine] Other (See Comments)    dizziness    Social History   Socioeconomic History  . Marital status: Married    Spouse name: Not on file  . Number of children: Not on file  . Years of education: Not on file  . Highest education level: Not on file  Occupational History  . Not on file  Social Needs  . Financial resource strain: Not on file  . Food insecurity:    Worry: Not on file    Inability: Not on file  . Transportation needs:    Medical: Not on file    Non-medical: Not on file  Tobacco Use  . Smoking status: Former Smoker    Packs/day: 0.50    Years: 52.00    Pack years: 26.00  Types: Cigarettes    Last attempt to quit: 09/08/2003    Years since quitting: 14.5  . Smokeless tobacco: Never Used  Substance and Sexual Activity  . Alcohol use: No    Comment: quit drinking 2004  . Drug use: No  . Sexual activity: Yes    Birth control/protection: None  Lifestyle  . Physical activity:    Days per week: Not on file    Minutes per session: Not on file  . Stress: Not on file  Relationships  . Social connections:    Talks on phone: Not on file    Gets together: Not on file    Attends religious service: Not on file    Active member of club or organization: Not on file    Attends meetings of clubs or organizations: Not on file    Relationship status: Not on file  . Intimate partner violence:    Fear of current or ex partner: Not on file    Emotionally abused: Not on file    Physically abused: Not on file    Forced sexual activity: Not on file  Other Topics Concern  . Not on file  Social History Narrative  . Not on file     ROS- All systems are reviewed and negative except as per the HPI above.  Physical Exam:  Vitals:   03/18/18 0855  BP: 112/72  Pulse: 86  Weight: 103.4 kg  Height: 6\' 1"  (1.854 m)    GEN- The patient is well appearing elderly male, alert and oriented x 3 today.   Head- normocephalic, atraumatic Eyes-  Sclera clear, conjunctiva pink Ears- hearing intact Oropharynx- clear Neck- supple  Lungs- Clear to ausculation bilaterally, normal work of breathing Heart- irregular rate and rhythm, no murmurs, rubs or gallops  GI- soft, NT, ND, + BS Extremities- no clubbing, cyanosis, or edema MS- no significant deformity or atrophy Skin- no rash or lesion Psych- euthymic mood, full affect Neuro- strength and sensation are intact  Wt Readings from Last 3 Encounters:  03/18/18 103.4 kg  02/18/18 102.1 kg  01/30/18 104.3 kg    EKG today demonstrates afib HR 86, QRS 84, QTc 454  Echo 11/23/15 demonstrated  - Left ventricle: The cavity size was normal. Wall thickness was   normal. The estimated ejection fraction was 55%. Although no   diagnostic regional wall motion abnormality was identified, this   possibility cannot be completely excluded on the basis of this   study. - Aortic valve: Moderately calcified annulus. Trileaflet; mildly   calcified leaflets. Cusp separation was reduced. Aortic valve was   not completely interrogated. There looks to be mild aortic   stenosis. Valve area by planimetry 1.8 cm^2. - Mitral valve: Calcified annulus. Mildly thickened, mildly   calcified leaflets . There was mild regurgitation. - Left atrium: The atrium was moderately dilated. - Right atrium: The atrium was mildly dilated. Central venous   pressure (est): 3 mm Hg. - Atrial septum: No defect or patent foramen ovale was identified. - Tricuspid valve: There was trivial regurgitation. - Pulmonary arteries: PA peak pressure: 30 mm Hg (S). - Pericardium, extracardiac: There was no pericardial effusion.  Epic records are reviewed at length today  Assessment and Plan:  1. Persistent  atrial fibrillation The patient has persistent atrial fibrillation.  S/p ablation with Dr Curt Bears. Symptomatically, he feels an improvement in his exercise tolerance. He is in rate controlled afib today and reports that he has fatigue that comes and goes. He  may be having paroxysms of afib. Will bring back for ECG to see if he is persistent. If so, we can pursue DCCV. Continue Eliquis 5 mg BID Continue Cartia XT 120 mg daily  This patients CHA2DS2-VASc Score and unadjusted Ischemic Stroke Rate (% per year) is equal to 2.2 % stroke rate/year from a score of 2  Above score calculated as 1 point each if present [CHF, HTN, DM, Vascular=MI/PAD/Aortic Plaque, Age if 65-74, or Male] Above score calculated as 2 points each if present [Age > 75, or Stroke/TIA/TE]   2. HTN Stable, no changes today.   Follow up in one week for ECG. Follow up with Dr Curt Bears as scheduled.  [facility] [address] [place], [place] [address] [phone] 03/18/2018 9:19 AM

## 2018-03-24 ENCOUNTER — Encounter (HOSPITAL_COMMUNITY): Payer: PPO | Admitting: Physician Assistant

## 2018-03-27 ENCOUNTER — Ambulatory Visit (HOSPITAL_COMMUNITY)
Admission: RE | Admit: 2018-03-27 | Discharge: 2018-03-27 | Disposition: A | Discharge status: Home / Self Care | Payer: PPO | Source: Ambulatory Visit | Attending: Physician Assistant | Admitting: Physician Assistant

## 2018-03-27 ENCOUNTER — Encounter (HOSPITAL_COMMUNITY): Payer: Self-pay | Admitting: Physician Assistant

## 2018-03-27 ENCOUNTER — Other Ambulatory Visit: Payer: Self-pay

## 2018-03-27 DIAGNOSIS — R9431 Abnormal electrocardiogram [ECG] [EKG]: Secondary | ICD-10-CM | POA: Diagnosis not present

## 2018-03-27 DIAGNOSIS — I4891 Unspecified atrial fibrillation: Secondary | ICD-10-CM | POA: Insufficient documentation

## 2018-03-27 NOTE — Progress Notes (Addendum)
Pt in for repeat EKG.  To be reviewed by Adline Peals, PA  Pt remains in rate controlled afib HR 90 QRS 86, QTc 442. He reports that his energy level has improved since his ablation but he is still not back to his baseline. Discussed with Dr Curt Bears, due to concerns about COVID-19, we are not scheduling elective DCCV at this time. Otherwise doing well. Will plan for Televisit with pt in one month. Patient is in agreement with plan.

## 2018-04-08 ENCOUNTER — Telehealth: Payer: Self-pay

## 2018-04-08 NOTE — Telephone Encounter (Signed)
IPF-PRO Registry clinical trial   Today I placed a call to Mr. Aspen Deterding Y5. I called both his home line and Mobile number listed on file. There was no answer on his home line and no voice mail set up. Placed call to his mobile number listed and also no answer. However voicemail was left on mobile line explaining the nature of my call. The nature of the call was to inform him that it was time for him to return to clinic for the IPF-PRO registry clinical trial 6 month interval blood collection and patient questionnaire packet completion. I asked that he return my call so that I could scheduled him to return to the clinic in the next few days if possible for the assessments to be completed.     Rosaland Lao

## 2018-04-21 ENCOUNTER — Other Ambulatory Visit: Payer: Self-pay

## 2018-04-21 ENCOUNTER — Ambulatory Visit (INDEPENDENT_AMBULATORY_CARE_PROVIDER_SITE_OTHER): Payer: PPO | Admitting: Family Medicine

## 2018-04-21 DIAGNOSIS — G8929 Other chronic pain: Secondary | ICD-10-CM

## 2018-04-21 DIAGNOSIS — M542 Cervicalgia: Secondary | ICD-10-CM

## 2018-04-21 DIAGNOSIS — J849 Interstitial pulmonary disease, unspecified: Secondary | ICD-10-CM

## 2018-04-21 DIAGNOSIS — I10 Essential (primary) hypertension: Secondary | ICD-10-CM | POA: Diagnosis not present

## 2018-04-21 DIAGNOSIS — I4819 Other persistent atrial fibrillation: Secondary | ICD-10-CM

## 2018-04-21 MED ORDER — HYDROCODONE-ACETAMINOPHEN 5-325 MG PO TABS: 1.0000 | ORAL_TABLET | Freq: Four times a day (QID) | ORAL | 0 refills | Status: DC | PRN

## 2018-04-21 MED ORDER — HYDROCODONE-ACETAMINOPHEN 5-325 MG PO TABS: ORAL_TABLET | ORAL | 0 refills | Status: DC

## 2018-04-21 NOTE — Progress Notes (Signed)
   Subjective:    Patient ID: Curtis [name], male    DOB: 11-Oct-1945, 73 y.o.   MRN: [id number] This was done as a phone visit Video was not available to the patient Coronavirus outbreak  HPI This patient was seen today for chronic pain  The medication list was reviewed and updated.   -Compliance with medication: takes 2 -4 a day  - Number patient states they take daily: 2 -4 per day  -when was the last dose patient took? Took 2 this morning around 2 am The patient was advised the importance of maintaining medication and not using illegal substances with these.  Here for refills and follow up  The patient was educated that we can provide 3 monthly scripts for their medication, it is their responsibility to follow the instructions.  Side effects or complications from medications: none  Patient is aware that pain medications are meant to minimize the severity of the pain to allow their pain levels to improve to allow for better function. They are aware of that pain medications cannot totally remove their pain.  Due for UDT ( at least once per year) : last one 02/20/17. Unable to do today since doing virtual visit.   Virtual Visit via Telephone Note  I connected with Curtis [name] on 04/21/18 at 10:00 AM EDT by telephone and verified that I am speaking with the correct person using two identifiers.   I discussed the limitations, risks, security and privacy concerns of performing an evaluation and management service by telephone and the availability of in person appointments. I also discussed with the patient that there may be a patient responsible charge related to this service. The patient expressed understanding and agreed to proceed.   History of Present Illness:    Observations/Objective:   Assessment and Plan:   Follow Up Instructions:    I discussed the assessment and treatment plan with the patient. The patient was provided an opportunity to ask questions  and all were answered. The patient agreed with the plan and demonstrated an understanding of the instructions.   The patient was advised to call back or seek an in-person evaluation if the symptoms worsen or if the condition fails to improve as anticipated.  I provided 15 minutes of non-face-to-face time during this encounter.  The patient does relate pain medicine allows him to function better he denies abusing it Overall he is handling the stress of the healthcare issues well He is trying to minimize his risk for coronavirus He is trying to eat healthy take his medicines on a regular basis He states he is not having any major issues with his blood pressure or atrial fibrillation It is followed by specialist          Review of Systems     Objective:   Physical Exam        Assessment & Plan:  Patient has interstitial lung disease.  He is at high risk for coronavirus complications Very important for this patient to stay at home This was stressed to the patient  Chronic neck and back pain uses hydrocodone intermittently does not abuse the medicine refills were sent in drug registry was checked  Blood pressure under good control continue current measures.  Follow-up 3 months

## 2018-04-23 ENCOUNTER — Ambulatory Visit: Payer: PPO | Admitting: Family Medicine

## 2018-04-24 ENCOUNTER — Other Ambulatory Visit: Payer: Self-pay

## 2018-04-24 ENCOUNTER — Ambulatory Visit (HOSPITAL_COMMUNITY)
Admission: RE | Admit: 2018-04-24 | Discharge: 2018-04-24 | Disposition: A | Discharge status: Home / Self Care | Payer: PPO | Source: Ambulatory Visit | Attending: Physician Assistant | Admitting: Physician Assistant

## 2018-04-24 ENCOUNTER — Encounter (HOSPITAL_COMMUNITY): Payer: Self-pay | Admitting: Physician Assistant

## 2018-04-24 VITALS — HR 76 | Ht 73.0 in | Wt 235.0 lb

## 2018-04-24 DIAGNOSIS — I4819 Other persistent atrial fibrillation: Secondary | ICD-10-CM | POA: Diagnosis not present

## 2018-04-24 DIAGNOSIS — I10 Essential (primary) hypertension: Secondary | ICD-10-CM

## 2018-04-24 NOTE — Progress Notes (Signed)
Electrophysiology TeleHealth Note   Due to national recommendations of social distancing due to [place] 19, Audio telehealth visit is felt to be most appropriate for this patient at this time.  See consent below from today for patient consent regarding telehealth for the [facility]. Consent obtained verbally.   Date:  04/24/2018   ID:  Noralee Chars, DOB 11/23/1945, MRN [id number]  Location: home  Provider location: [address] [place], [place] [address] Evaluation Performed: Follow up  PCP:  Kathyrn Drown, MD  Primary Cardiologist:  Dr Johnsie Cancel Primary Electrophysiologist: Dr Curt Bears   CC: Follow up for atrial fibrillation   History of Present Illness: Arhum Peeples is a 73 y.o. male who presents via audio/video conferencing for a telehealth visit today. Patient reports that overall he is doing well and has more energy since his last visit. He states that he feels he is in afib most days but does have periods when he feels his heart is in normal rhythm. He has a pulse oximeter which shows controlled rates, typically in the 70s.   Today, he denies symptoms of palpitations, chest pain, shortness of breath, orthopnea, PND, lower extremity edema, claudication, dizziness, presyncope, syncope, bleeding, or neurologic sequela. The patient is tolerating medications without difficulties and is otherwise without complaint today.   he denies symptoms of cough, fevers, chills, or new SOB worrisome for COVID 19.     Atrial Fibrillation Risk Factors:  he does not have symptoms or diagnosis of sleep apnea. he does not have a history of rheumatic fever. he does not have a history of alcohol use. The patient does not have a history of early familial atrial fibrillation or other arrhythmias.  he has a BMI of Body mass index is 31 kg/m.Marland Kitchen Filed Weights   04/24/18 1417  Weight: 106.6 kg    Past Medical History:  Diagnosis Date   Arthritis    Asthma    Atrial  fibrillation [facility])    Cataract    Cervical spondylosis    Chest pain    Emergency room October 24, 2011, no MI  //   Nuclear, October, 2013, adenosine, EF 61%, no scar or ischemia   Dyslipidemia    Statin intolerant   Dyspnea    GERD (gastroesophageal reflux disease)    Glaucoma    Hearing loss    Hypertension    Hypertension    Idiopathic pulmonary fibrosis ([place])    Interstitial pulmonary fibrosis ([place]) 2012   Left shoulder pain 2011   Myocardial infarction ([place])    Psoriasis    Pulmonary fibrosis ([place])    Statin intolerance    Past Surgical History:  Procedure Laterality Date   ATRIAL FIBRILLATION ABLATION N/A 02/18/2018   Procedure: ATRIAL FIBRILLATION ABLATION;  Surgeon: Constance Haw, MD;  Location: [place] [facility];  Service: Cardiovascular;  Laterality: N/A;   BRAIN SURGERY     CARDIOVERSION N/A 12/13/2015   Procedure: CARDIOVERSION;  Surgeon: Josue Hector, MD;  Location: [place];  Service: Cardiovascular;  Laterality: N/A;   CATARACT EXTRACTION W/PHACO Right 06/11/2012   Procedure: CATARACT EXTRACTION PHACO AND INTRAOCULAR LENS PLACEMENT ([place]);  Surgeon: Tonny Branch, MD;  Location: [facility];  Service: Ophthalmology;  Laterality: Right;  CDE:  14.43   CATARACT EXTRACTION W/PHACO Left 06/29/2012   Procedure: CATARACT EXTRACTION PHACO AND INTRAOCULAR LENS PLACEMENT ([facility]);  Surgeon: Tonny Branch, MD;  Location: [facility];  Service: Ophthalmology;  Laterality: Left;  CDE:14.11   CHOLECYSTECTOMY  05/08/2012   CHOLECYSTECTOMY N/A 05/08/2012   Procedure: LAPAROSCOPIC CHOLECYSTECTOMY;  Surgeon: Harl Bowie, MD;  Location: [place];  Service: General;  Laterality: N/A;   COLONOSCOPY     LIVER BIOPSY N/A 05/08/2012   Procedure: LIVER BIOPSY;  Surgeon: Harl Bowie, MD;  Location: [place];  Service: General;  Laterality: N/A;   NECK SURGERY     cervical disc with cliip   SHOULDER ARTHROSCOPY WITH DISTAL CLAVICLE RESECTION Right 12/12/2016    Procedure: RIGHT SHOULDER ARTHROSCOPY, SUBACROMIAL DECOMPRESSION, DISTAL CLAVICLE RESECTION, WITH POSSIBLE MINI OPEN ROTATOR CUFF REPAIR;  Surgeon: [name], MD;  Location: [place];  Service: Orthopedics;  Laterality: Right;     Current Outpatient Medications  Medication Sig Dispense Refill   albuterol (PROVENTIL HFA;VENTOLIN HFA) 108 (90 Base) MCG/ACT inhaler Inhale 2 puffs into the lungs every 6 (six) hours as needed for wheezing. 1 Inhaler 2   ALPRAZolam (XANAX) 0.5 MG tablet Take 0.5-1 tablets (0.25-0.5 mg total) by mouth 2 (two) times daily as needed for anxiety or sleep. 20 tablet 1   CARTIA XT 120 MG 24 hr capsule TAKE 1 CAPSULE(120 MG) BY MOUTH DAILY 30 capsule 5   doxycycline (VIBRAMYCIN) 50 MG capsule 1 capsule daily.     ELIQUIS 5 MG TABS tablet TAKE 1 TABLET BY MOUTH TWICE DAILY 60 tablet 4   fexofenadine (ALLEGRA) 180 MG tablet Take 180 mg by mouth daily as needed for allergies or rhinitis.     fluticasone (FLONASE) 50 MCG/ACT nasal spray Place 2 sprays into both nostrils daily. (Patient taking differently: Place 1 spray into both nostrils daily. ) 16 g 5   Guaifenesin (MUCUS RELIEF MAX ST) 1200 MG TB12 Take 1 tablet by mouth as needed.     HYDROcodone-acetaminophen (NORCO/VICODIN) 5-325 MG tablet Take 1 tablet by mouth 4 (four) times daily as needed for moderate pain. 120 tablet 0   omeprazole (PRILOSEC) 40 MG capsule Take 40 mg by mouth daily.      OXYGEN Inhale 2.25 L into the lungs as needed (for shortness of breath).     Polyethyl Glycol-Propyl Glycol (SYSTANE ULTRA OP) Place 1 drop into both eyes daily.     No current facility-administered medications for this encounter.     Allergies:   Pirfenidone; Acyclovir and related; Oxycodone; Pravastatin; Simvastatin; Prednisone; and Tegretol [carbamazepine]   Social History:  The patient  reports that he quit smoking about 14 years ago. His smoking use included cigarettes. He has a 26.00 pack-year smoking  history. He has never used smokeless tobacco. He reports that he does not drink alcohol or use drugs.   Family History:  The patient's  family history includes Breast cancer in his sister; COPD in his mother; Congestive Heart Failure in his mother; Hypertension in his mother; Stroke in his father.    ROS:  Please see the history of present illness.   All other systems are personally reviewed and negative.   Recent Labs: 07/30/2017: ALT 12 01/30/2018: BUN 10; Creatinine, Ser 0.97; Hemoglobin 15.0; Platelets 142; Potassium 4.2; Sodium 141  personally reviewed    Other studies personally reviewed: Additional studies/ records that were reviewed today include: Epic notes   ASSESSMENT AND PLAN:  1. Persistent atrial fibrillation S/p ablation with Dr Curt Bears. He continues to have fatigue symptoms but this has improved overall. Not a good candidate for class IC given prior MI and would avoid amiodarone with lung disease. Will still plan for DCCV once COVID-19 precautions end.  Patient  in agreement with plan. Continue Eliquis 5 mg BID Continue Cartia XT 120 mg daily  This patients CHA2DS2-VASc Score and unadjusted Ischemic Stroke Rate (% per year) is equal to 2.2 % stroke rate/year from a score of 2  Above score calculated as 1 point each if present [CHF, HTN, DM, Vascular=MI/PAD/Aortic Plaque, Age if 65-74, or Male] Above score calculated as 2 points each if present [Age > 75, or Stroke/TIA/TE]  2. HTN Stable, no changes today   COVID screen The patient does not have any symptoms that suggest any further testing/ screening at this time.  Social distancing reinforced today.    Follow-up with Dr Curt Bears as scheduled.  Current medicines are reviewed at length with the patient today.   The patient does not have concerns regarding his medicines.  The following changes were made today:  none  Labs/ tests ordered today include:  No orders of the defined types were placed in this  encounter.   Patient Risk:  after full review of this patients clinical status, I feel that they are at moderate risk at this time.   Today, I have spent 11 minutes with the patient with telehealth technology discussing atrial fibrillation and COVID-19 precautions.    Gwenlyn Perking PA-C 04/24/2018 2:50 PM  Af[facility] [address] [place], [place] [address] [phone]   I hereby voluntarily request, consent and authorize the [facility] and its employed or contracted physicians, physician assistants, nurse practitioners or other licensed health care professionals (the Practitioner), to provide me with telemedicine health care services (the "Services") as deemed necessary by the treating Practitioner. I acknowledge and consent to receive the Services by the Practitioner via telemedicine. I understand that the telemedicine visit will involve communicating with the Practitioner through live audiovisual communication technology and the disclosure of certain medical information by electronic transmission. I acknowledge that I have been given the opportunity to request an in-person assessment or other available alternative prior to the telemedicine visit and am voluntarily participating in the telemedicine visit.   I understand that I have the right to withhold or withdraw my consent to the use of telemedicine in the course of my care at any time, without affecting my right to future care or treatment, and that the Practitioner or I may terminate the telemedicine visit at any time. I understand that I have the right to inspect all information obtained and/or recorded in the course of the telemedicine visit and may receive copies of available information for a reasonable fee.  I understand that some of the potential risks of receiving the Services via telemedicine include:   Delay or interruption in medical evaluation due to technological equipment  failure or disruption;  Information transmitted may not be sufficient (e.g. poor resolution of images) to allow for appropriate medical decision making by the Practitioner; and/or  In rare instances, security protocols could fail, causing a breach of personal health information.   Furthermore, I acknowledge that it is my responsibility to provide information about my medical history, conditions and care that is complete and accurate to the best of my ability. I acknowledge that Practitioner's advice, recommendations, and/or decision may be based on factors not within their control, such as incomplete or inaccurate data provided by me or distortions of diagnostic images or specimens that may result from electronic transmissions. I understand that the practice of medicine is not an exact science and that Practitioner makes no warranties or guarantees regarding treatment outcomes. I  acknowledge that I will receive a copy of this consent concurrently upon execution via email to the email address I last provided but may also request a printed copy by calling the office of the [facility].  I understand that my insurance will be billed for this visit.   I have read or had this consent read to me.  I understand the contents of this consent, which adequately explains the benefits and risks of the Services being provided via telemedicine.  I have been provided ample opportunity to ask questions regarding this consent and the Services and have had my questions answered to my satisfaction.  I give my informed consent for the services to be provided through the use of telemedicine in my medical care  By participating in this telemedicine visit I agree to the above.

## 2018-05-19 ENCOUNTER — Telehealth: Payer: Self-pay | Admitting: *Deleted

## 2018-05-19 NOTE — Telephone Encounter (Signed)
Calling patient today to discuss upcoming appointment.  We are currently trying to limit exposure to the virus that causes COVID-19 by seeing patients at home rather than in the office. We would like to schedule this appointment as a Virtual Appointment VIA Smartphone or Laptop. Unable to reach patient.  LVMTCB  

## 2018-05-25 ENCOUNTER — Telehealth: Payer: Self-pay | Admitting: Cardiology

## 2018-05-25 NOTE — Telephone Encounter (Signed)
New message;    Patient would like to cancel appt, but will do a virtual appt. Please call patient.

## 2018-05-25 NOTE — Telephone Encounter (Signed)

## 2018-05-26 ENCOUNTER — Telehealth (INDEPENDENT_AMBULATORY_CARE_PROVIDER_SITE_OTHER): Payer: PPO | Admitting: Cardiology

## 2018-05-26 ENCOUNTER — Other Ambulatory Visit: Payer: Self-pay

## 2018-05-26 DIAGNOSIS — I4819 Other persistent atrial fibrillation: Secondary | ICD-10-CM | POA: Diagnosis not present

## 2018-05-26 NOTE — Patient Instructions (Signed)
Medication Instructions:  Your physician recommends that you continue on your current medications as directed. Please refer to the Current Medication list given to you today.  If you need a refill on your cardiac medications before your next appointment, please call your pharmacy.   Lab work: None ordered If you have labs (blood work) drawn today and your tests are completely normal, you will receive your results only by: Marland Kitchen MyChart Message (if you have MyChart) OR . A paper copy in the mail If you have any lab test that is abnormal or we need to change your treatment, we will call you to review the results.  Testing/Procedures: None ordered.. will discuss Ablation at your next appointment  Follow-Up: At [facility], you and your health needs are our priority.  As part of our continuing mission to provide you with exceptional heart care, we have created designated Provider Care Teams.  These Care Teams include your primary Cardiologist (physician) and Advanced Practice Providers (APPs -  Physician Assistants and Nurse Practitioners) who all work together to provide you with the care you need, when you need it. You will need a follow up appointment in 2 months.  Please call our office 2 months in advance to schedule this appointment.  You may see Will Meredith Leeds, MD or one of the following Advanced Practice Providers on your designated Care Team:   Chanetta Marshall, NP . Tommye Standard, PA-C  Any Other Special Instructions Will Be Listed Below (If Applicable).

## 2018-05-26 NOTE — Progress Notes (Signed)
Electrophysiology TeleHealth Note   Due to national recommendations of social distancing due to COVID 19, an audio/video telehealth visit is felt to be most appropriate for this patient at this time.  See Epic message for the patient's consent to telehealth for [facility].   Date:  05/26/2018   ID:  Curtis [name], DOB [birth year], MRN [id number]  Location: patient's home  Provider location: [address] [place]  Evaluation Performed: Follow-up visit  PCP:  Kathyrn Drown, MD  Cardiologist:  Johnsie Cancel Electrophysiologist:  Dr Curt Bears  Chief Complaint:  AF  History of Present Illness:    Curtis [name] is a 73 y.o. male who presents via audio/video conferencing for a telehealth visit today.  Since last being seen in our clinic, the patient reports doing very well.  Today, he denies symptoms of palpitations, chest pain, shortness of breath,  lower extremity edema, dizziness, presyncope, or syncope.  The patient is otherwise without complaint today.  The patient denies symptoms of fevers, chills, cough, or new SOB worrisome for COVID 19.  He has a history significant for atrial fibrillation status post ablation 02/18/2018.  Unfortunately he has had more atrial fibrillation since that time.  Today, denies symptoms of palpitations, chest pain, shortness of breath, orthopnea, PND, lower extremity edema, claudication, dizziness, presyncope, syncope, bleeding, or neurologic sequela. The patient is tolerating medications without difficulties.  He feels well today.  He does continue to have days where he feels well and basically feels poorly.  On the days that he feels poorly he feels wiped out and more short of breath.  He is not able to do many of his daily activities when he thinks that he is in atrial fibrillation.  The week after ablation, he felt like he was 2 years younger.  Past Medical History:  Diagnosis Date  . Arthritis   . Asthma   . Atrial  fibrillation ([place])   . Cataract   . Cervical spondylosis   . Chest pain    Emergency room October 24, 2011, no MI  //   Nuclear, October, 2013, adenosine, EF 61%, no scar or ischemia  . Dyslipidemia    Statin intolerant  . Dyspnea   . GERD (gastroesophageal reflux disease)   . Glaucoma   . Hearing loss   . Hypertension   . Hypertension   . Idiopathic pulmonary fibrosis ([place])   . Interstitial pulmonary fibrosis ([place]) 2012  . Left shoulder pain 2011  . Myocardial infarction ([place])   . Psoriasis   . Pulmonary fibrosis ([place])   . Statin intolerance     Past Surgical History:  Procedure Laterality Date  . ATRIAL FIBRILLATION ABLATION N/A 02/18/2018   Procedure: ATRIAL FIBRILLATION ABLATION;  Surgeon: Constance Haw, MD;  Location: [place] [facility];  Service: Cardiovascular;  Laterality: N/A;  . BRAIN SURGERY    . CARDIOVERSION N/A 12/13/2015   Procedure: CARDIOVERSION;  Surgeon: Josue Hector, MD;  Location: [place];  Service: Cardiovascular;  Laterality: N/A;  . CATARACT EXTRACTION W/PHACO Right 06/11/2012   Procedure: CATARACT EXTRACTION PHACO AND INTRAOCULAR LENS PLACEMENT ([place]);  Surgeon: Tonny Branch, MD;  Location: [facility];  Service: Ophthalmology;  Laterality: Right;  CDE:  14.43  . CATARACT EXTRACTION W/PHACO Left 06/29/2012   Procedure: CATARACT EXTRACTION PHACO AND INTRAOCULAR LENS PLACEMENT ([facility]);  Surgeon: Tonny Branch, MD;  [facility]: [facility];  Service: Ophthalmology;  Laterality: Left;  CDE:14.11  . CHOLECYSTECTOMY  05/08/2012  . CHOLECYSTECTOMY N/A 05/08/2012  Procedure: LAPAROSCOPIC CHOLECYSTECTOMY;  Surgeon: Harl Bowie, MD;  Location: [place];  Service: General;  Laterality: N/A;  . COLONOSCOPY    . LIVER BIOPSY N/A 05/08/2012   Procedure: LIVER BIOPSY;  Surgeon: Harl Bowie, MD;  Location: [place];  Service: General;  Laterality: N/A;  . NECK SURGERY     cervical disc with cliip  . SHOULDER ARTHROSCOPY WITH DISTAL CLAVICLE RESECTION Right 12/12/2016    Procedure: RIGHT SHOULDER ARTHROSCOPY, SUBACROMIAL DECOMPRESSION, DISTAL CLAVICLE RESECTION, WITH POSSIBLE MINI OPEN ROTATOR CUFF REPAIR;  Surgeon: [name], MD;  Location: [place];  Service: Orthopedics;  Laterality: Right;    Current Outpatient Medications  Medication Sig Dispense Refill  . albuterol (PROVENTIL HFA;VENTOLIN HFA) 108 (90 Base) MCG/ACT inhaler Inhale 2 puffs into the lungs every 6 (six) hours as needed for wheezing. 1 Inhaler 2  . ALPRAZolam (XANAX) 0.5 MG tablet Take 0.5-1 tablets (0.25-0.5 mg total) by mouth 2 (two) times daily as needed for anxiety or sleep. 20 tablet 1  . CARTIA XT 120 MG 24 hr capsule TAKE 1 CAPSULE(120 MG) BY MOUTH DAILY 30 capsule 5  . doxycycline (VIBRAMYCIN) 50 MG capsule 1 capsule daily.    Marland Kitchen ELIQUIS 5 MG TABS tablet TAKE 1 TABLET BY MOUTH TWICE DAILY 60 tablet 4  . fexofenadine (ALLEGRA) 180 MG tablet Take 180 mg by mouth daily as needed for allergies or rhinitis.    . fluticasone (FLONASE) 50 MCG/ACT nasal spray Place 2 sprays into both nostrils daily. (Patient taking differently: Place 1 spray into both nostrils daily. ) 16 g 5  . Guaifenesin (MUCUS RELIEF MAX ST) 1200 MG TB12 Take 1 tablet by mouth as needed.    Marland Kitchen HYDROcodone-acetaminophen (NORCO/VICODIN) 5-325 MG tablet Take 1 tablet by mouth 4 (four) times daily as needed for moderate pain. 120 tablet 0  . omeprazole (PRILOSEC) 40 MG capsule Take 40 mg by mouth daily.     . OXYGEN Inhale 2.25 L into the lungs as needed (for shortness of breath).    Vladimir Faster Glycol-Propyl Glycol (SYSTANE ULTRA OP) Place 1 drop into both eyes daily.     No current facility-administered medications for this visit.     Allergies:   Pirfenidone; Acyclovir and related; Oxycodone; Pravastatin; Simvastatin; Prednisone; and Tegretol [carbamazepine]   Social History:  The patient  reports that he quit smoking about 14 years ago. His smoking use included cigarettes. He has a 26.00 pack-year smoking history. He  has never used smokeless tobacco. He reports that he does not drink alcohol or use drugs.   Family History:  The patient's  family history includes Breast cancer in his sister; COPD in his mother; Congestive Heart Failure in his mother; Hypertension in his mother; Stroke in his father.   ROS:  Please see the history of present illness.   All other systems are personally reviewed and negative.    Exam:    Vital Signs:  Pulse 72   Resp (!) 94   Over the phone, no acute distress, no shortness of breath.  Labs/Other Tests and Data Reviewed:    Recent Labs: 07/30/2017: ALT 12 01/30/2018: BUN 10; Creatinine, Ser 0.97; Hemoglobin 15.0; Platelets 142; Potassium 4.2; Sodium 141   Wt Readings from Last 3 Encounters:  04/24/18 235 lb (106.6 kg)  03/18/18 228 lb (103.4 kg)  02/18/18 225 lb (102.1 kg)     Other studies personally reviewed: Additional studies/ records that were reviewed today include: ECG 03/27/2018 personally reviewed Review of  the above records today demonstrates: Atrial fibrillation  ASSESSMENT & PLAN:    1.  Persistent atrial fibrillation: Status post ablation.  Currently on Eliquis and Cartia XT.  Unfortunately he has gone back into atrial fibrillation.  His atrial fibrillation is acting more paroxysmal at this point.  I do feel that he would likely benefit from a repeat ablation.  He would like to hold off on this until the coronavirus pandemic has passed or we are doing more elective cases.  I Curtis [name] see him back at the end of June/beginning of July for further discussion.  This patients CHA2DS2-VASc Score and unadjusted Ischemic Stroke Rate (% per year) is equal to 2.2 % stroke rate/year from a score of 2  Above score calculated as 1 point each if present [CHF, HTN, DM, Vascular=MI/PAD/Aortic Plaque, Age if 65-74, or Male] Above score calculated as 2 points each if present [Age > 75, or Stroke/TIA/TE]  2.  Hypertension: Patient has no way to check blood pressure at  home.  Continue current medications.   COVID 19 screen The patient denies symptoms of COVID 19 at this time.  The importance of social distancing was discussed today.  Follow-up: 2 to 3 months  Current medicines are reviewed at length with the patient today.   The patient does not have concerns regarding his medicines.  The following changes were made today:  none  Labs/ tests ordered today include:  No orders of the defined types were placed in this encounter.    Patient Risk:  after full review of this patients clinical status, I feel that they are at moderate risk at this time.  Today, I have spent 11 minutes with the patient with telehealth technology discussing atrial fibrillation.    Signed, Curtis Firkus Meredith Leeds, MD  05/26/2018 9:28 AM     [place] [address] [phone] (office) [phone] (fax)

## 2018-06-17 ENCOUNTER — Telehealth: Payer: Self-pay | Admitting: Cardiology

## 2018-06-17 NOTE — Telephone Encounter (Signed)
New message   Patient's wife is calling to schedule ablation. Please call to discuss.

## 2018-06-18 NOTE — Telephone Encounter (Signed)
Per pt call returning this office call to schedule.  Pt asked that we call his wife at work.

## 2018-06-18 NOTE — Telephone Encounter (Signed)
Spoke to wife. Scheduled phone telemedicine visit w/ Dr. Curt Bears on 6/23 to discuss scheduling repeat ablation. Wife agreeable to plan.

## 2018-06-30 ENCOUNTER — Telehealth (INDEPENDENT_AMBULATORY_CARE_PROVIDER_SITE_OTHER): Payer: PPO | Admitting: Cardiology

## 2018-06-30 ENCOUNTER — Other Ambulatory Visit: Payer: Self-pay

## 2018-06-30 ENCOUNTER — Telehealth: Payer: Self-pay | Admitting: Cardiology

## 2018-06-30 DIAGNOSIS — I4819 Other persistent atrial fibrillation: Secondary | ICD-10-CM | POA: Diagnosis not present

## 2018-06-30 NOTE — Telephone Encounter (Signed)
New message     Pt says he is returning a call to schedule a CT for Monday     Please call back

## 2018-06-30 NOTE — Progress Notes (Signed)
Electrophysiology TeleHealth Note   Due to national recommendations of social distancing due to COVID 19, an audio/video telehealth visit is felt to be most appropriate for this patient at this time.  See Epic message for the patient's consent to telehealth for [facility].   Date:  06/30/2018   ID:  Curtis [name], DOB Feb 09, 1945, MRN [id number]  Location: patient's home  Provider location: [address], [place]  Evaluation Performed: Follow-up visit  PCP:  Kathyrn Drown, MD  Cardiologist: Jenkins Rouge Electrophysiologist:  Dr Curt Bears  Chief Complaint: Atrial fibrillation  History of Present Illness:    Curtis [name] is a 73 y.o. male who presents via audio/video conferencing for a telehealth visit today.  Since last being seen in our clinic, the patient reports doing very well.  Today, he denies symptoms of palpitations, chest pain, shortness of breath,  lower extremity edema, dizziness, presyncope, or syncope.  The patient is otherwise without complaint today.  The patient denies symptoms of fevers, chills, cough, or new SOB worrisome for COVID 19.  He has a history of atrial fibrillation status post ablation 02/18/2018.  He is unfortunately had more atrial fibrillation since that time.  His main symptom with his atrial fibrillation is weakness and fatigue.  He felt quite a bit better after his ablation was performed.  Today, denies symptoms of palpitations, chest pain, shortness of breath, orthopnea, PND, lower extremity edema, claudication, dizziness, presyncope, syncope, bleeding, or neurologic sequela. The patient is tolerating medications without difficulties.  His main symptoms are weakness and fatigue.  He says that he is less weak and fatigued than he was prior to his initial ablation, but this has come back.  He does not note palpitations.  He is interested in a repeat ablation.  Past Medical History:  Diagnosis Date  . Arthritis   . Asthma    . Atrial fibrillation ([place])   . Cataract   . Cervical spondylosis   . Chest pain    Emergency room October 24, 2011, no MI  //   Nuclear, October, 2013, adenosine, EF 61%, no scar or ischemia  . Dyslipidemia    Statin intolerant  . Dyspnea   . GERD (gastroesophageal reflux disease)   . Glaucoma   . Hearing loss   . Hypertension   . Hypertension   . Idiopathic pulmonary fibrosis ([place])   . Interstitial pulmonary fibrosis ([place]) 2012  . Left shoulder pain 2011  . Myocardial infarction ([place])   . Psoriasis   . Pulmonary fibrosis ([place])   . Statin intolerance     Past Surgical History:  Procedure Laterality Date  . ATRIAL FIBRILLATION ABLATION N/A 02/18/2018   Procedure: ATRIAL FIBRILLATION ABLATION;  Surgeon: Constance Haw, MD;  Location: [facility];  Service: Cardiovascular;  Laterality: N/A;  . BRAIN SURGERY    . CARDIOVERSION N/A 12/13/2015   Procedure: CARDIOVERSION;  Surgeon: Josue Hector, MD;  Location: [facility];  Service: Cardiovascular;  Laterality: N/A;  . CATARACT EXTRACTION W/PHACO Right 06/11/2012   Procedure: CATARACT EXTRACTION PHACO AND INTRAOCULAR LENS PLACEMENT ([place]);  Surgeon: Tonny Branch, MD;  Location: [facility];  Service: Ophthalmology;  Laterality: Right;  CDE:  14.43  . CATARACT EXTRACTION W/PHACO Left 06/29/2012   Procedure: CATARACT EXTRACTION PHACO AND INTRAOCULAR LENS PLACEMENT ([facility]);  Surgeon: Tonny Branch, MD;  Location: [facility];  Service: Ophthalmology;  Laterality: Left;  CDE:14.11  . CHOLECYSTECTOMY  05/08/2012  . CHOLECYSTECTOMY N/A 05/08/2012   Procedure:  LAPAROSCOPIC CHOLECYSTECTOMY;  Surgeon: Harl Bowie, MD;  Location: [facility];  Service: General;  Laterality: N/A;  . COLONOSCOPY    . LIVER BIOPSY N/A 05/08/2012   Procedure: LIVER BIOPSY;  Surgeon: Harl Bowie, MD;  Location: [facility];  Service: General;  Laterality: N/A;  . NECK SURGERY     cervical disc with cliip  . SHOULDER ARTHROSCOPY WITH DISTAL CLAVICLE RESECTION Right  12/12/2016   Procedure: RIGHT SHOULDER ARTHROSCOPY, SUBACROMIAL DECOMPRESSION, DISTAL CLAVICLE RESECTION, WITH POSSIBLE MINI OPEN ROTATOR CUFF REPAIR;  Surgeon: [name], MD;  Location: [facility];  Service: Orthopedics;  Laterality: Right;    Current Outpatient Medications  Medication Sig Dispense Refill  . albuterol (PROVENTIL HFA;VENTOLIN HFA) 108 (90 Base) MCG/ACT inhaler Inhale 2 puffs into the lungs every 6 (six) hours as needed for wheezing. 1 Inhaler 2  . ALPRAZolam (XANAX) 0.5 MG tablet Take 0.5-1 tablets (0.25-0.5 mg total) by mouth 2 (two) times daily as needed for anxiety or sleep. 20 tablet 1  . CARTIA XT 120 MG 24 hr capsule TAKE 1 CAPSULE(120 MG) BY MOUTH DAILY 30 capsule 5  . doxycycline (VIBRAMYCIN) 50 MG capsule 1 capsule daily.    Marland Kitchen ELIQUIS 5 MG TABS tablet TAKE 1 TABLET BY MOUTH TWICE DAILY 60 tablet 4  . fexofenadine (ALLEGRA) 180 MG tablet Take 180 mg by mouth daily as needed for allergies or rhinitis.    . fluticasone (FLONASE) 50 MCG/ACT nasal spray Place 2 sprays into both nostrils daily. (Patient taking differently: Place 1 spray into both nostrils daily. ) 16 g 5  . Guaifenesin (MUCUS RELIEF MAX ST) 1200 MG TB12 Take 1 tablet by mouth as needed.    Marland Kitchen HYDROcodone-acetaminophen (NORCO/VICODIN) 5-325 MG tablet Take 1 tablet by mouth 4 (four) times daily as needed for moderate pain. 120 tablet 0  . omeprazole (PRILOSEC) 40 MG capsule Take 40 mg by mouth daily.     . OXYGEN Inhale 2.25 L into the lungs as needed (for shortness of breath).    Vladimir Faster Glycol-Propyl Glycol (SYSTANE ULTRA OP) Place 1 drop into both eyes daily.     No current facility-administered medications for this visit.     Allergies:   Pirfenidone, Acyclovir and related, Oxycodone, Pravastatin, Simvastatin, Prednisone, and Tegretol [carbamazepine]   Social History:  The patient  reports that he quit smoking about 14 years ago. His smoking use included cigarettes. He has a 26.00 pack-year smoking  history. He has never used smokeless tobacco. He reports that he does not drink alcohol or use drugs.   Family History:  The patient's  family history includes Breast cancer in his sister; COPD in his mother; Congestive Heart Failure in his mother; Hypertension in his mother; Stroke in his father.   ROS:  Please see the history of present illness.   All other systems are personally reviewed and negative.    Exam:    Vital Signs:  There were no vitals taken for this visit.  Over the phone, no acute distress, no shortness of breath.  Labs/Other Tests and Data Reviewed:    Recent Labs: 07/30/2017: ALT 12 01/30/2018: BUN 10; Creatinine, Ser 0.97; Hemoglobin 15.0; Platelets 142; Potassium 4.2; Sodium 141   Wt Readings from Last 3 Encounters:  04/24/18 235 lb (106.6 kg)  03/18/18 228 lb (103.4 kg)  02/18/18 225 lb (102.1 kg)     Other studies personally reviewed: Additional studies/ records that were reviewed today include: ECG 03/27/2018 personally reviewed Review of the  above records today demonstrates: Atrial fibrillation   ASSESSMENT & PLAN:    1.  Persistent atrial fibrillation: Status post ablation.  Currently on Eliquis and [place].  Unfortunately he has gone back into atrial fibrillation and has become more paroxysmal.  At this point he would like a repeat ablation for atrial fibrillation.  Risks and benefits were discussed and include bleeding, tamponade, heart block, stroke, damage to surrounding organs.  Patient understands the risks and is agreed to the procedure.  This patients CHA2DS2-VASc Score and unadjusted Ischemic Stroke Rate (% per year) is equal to 2.2 % stroke rate/year from a score of 2  Above score calculated as 1 point each if present [CHF, HTN, DM, Vascular=MI/PAD/Aortic Plaque, Age if 65-74, or Male] Above score calculated as 2 points each if present [Age > 75, or Stroke/TIA/TE]  2.  Hypertension: Has not been taking blood pressures at home.  Continue  current meds.   COVID 19 screen The patient denies symptoms of COVID 19 at this time.  The importance of social distancing was discussed today.  Follow-up: 3 months   Current medicines are reviewed at length with the patient today.   The patient does not have concerns regarding his medicines.  The following changes were made today:  none  Labs/ tests ordered today include:  No orders of the defined types were placed in this encounter.    Patient Risk:  after full review of this patients clinical status, I feel that they are at moderate risk at this time.  Today, I have spent 8 minutes with the patient with telehealth technology discussing atrial fibrillation.    Signed, Dreshaun Stene Meredith Leeds, MD  06/30/2018 10:27 AM     [facility] [address] [address] [phone] (office) [phone] (fax)

## 2018-06-30 NOTE — Addendum Note (Signed)
Addended by: Stanton Kidney on: 06/30/2018 01:45 PM   Modules accepted: Orders

## 2018-07-06 ENCOUNTER — Telehealth: Payer: Self-pay | Admitting: *Deleted

## 2018-07-06 NOTE — Telephone Encounter (Signed)
Ablation scheduled for 7/10. instructions reviewed w/ pt and sent via MyChart. CT instructions reviewed and also sent via MyChart. covid screening scheduled for 7/10 and instructions reviewed. Aware office will contact him to arrange post procedure f/u appts Patient verbalized understanding and agreeable to plan.

## 2018-07-09 ENCOUNTER — Other Ambulatory Visit (HOSPITAL_COMMUNITY): Payer: Self-pay | Admitting: Emergency Medicine

## 2018-07-09 DIAGNOSIS — Z01812 Encounter for preprocedural laboratory examination: Secondary | ICD-10-CM

## 2018-07-10 ENCOUNTER — Telehealth (HOSPITAL_COMMUNITY): Payer: Self-pay | Admitting: *Deleted

## 2018-07-10 NOTE — Telephone Encounter (Signed)
Called and spoke with patient regarding CT heart.  Asked to hold Albuterol and Flonase.

## 2018-07-13 ENCOUNTER — Other Ambulatory Visit: Payer: Self-pay

## 2018-07-13 ENCOUNTER — Ambulatory Visit (HOSPITAL_COMMUNITY): Payer: PPO

## 2018-07-13 ENCOUNTER — Ambulatory Visit (HOSPITAL_COMMUNITY)
Admission: RE | Admit: 2018-07-13 | Discharge: 2018-07-13 | Disposition: A | Discharge status: Home / Self Care | Payer: PPO | Source: Ambulatory Visit | Attending: Cardiology | Admitting: Cardiology

## 2018-07-13 DIAGNOSIS — I4819 Other persistent atrial fibrillation: Secondary | ICD-10-CM

## 2018-07-13 LAB — POCT I-STAT CREATININE: Creatinine, Ser: 1 mg/dL (ref 0.61–1.24)

## 2018-07-13 MED ORDER — IOHEXOL 350 MG/ML SOLN
100.0000 mL | Freq: Once | INTRAVENOUS | Status: AC | PRN
  Administered 2018-07-13: 100 mL via INTRAVENOUS

## 2018-07-14 ENCOUNTER — Ambulatory Visit (INDEPENDENT_AMBULATORY_CARE_PROVIDER_SITE_OTHER): Payer: PPO | Admitting: Family Medicine

## 2018-07-14 ENCOUNTER — Other Ambulatory Visit (HOSPITAL_COMMUNITY)
Admission: RE | Admit: 2018-07-14 | Discharge: 2018-07-14 | Disposition: A | Discharge status: Home / Self Care | Payer: PPO | Source: Ambulatory Visit | Attending: Cardiology | Admitting: Cardiology

## 2018-07-14 DIAGNOSIS — I10 Essential (primary) hypertension: Secondary | ICD-10-CM

## 2018-07-14 DIAGNOSIS — E785 Hyperlipidemia, unspecified: Secondary | ICD-10-CM

## 2018-07-14 DIAGNOSIS — Z1159 Encounter for screening for other viral diseases: Secondary | ICD-10-CM | POA: Insufficient documentation

## 2018-07-14 DIAGNOSIS — Z125 Encounter for screening for malignant neoplasm of prostate: Secondary | ICD-10-CM

## 2018-07-14 DIAGNOSIS — G8929 Other chronic pain: Secondary | ICD-10-CM

## 2018-07-14 DIAGNOSIS — M545 Low back pain: Secondary | ICD-10-CM

## 2018-07-14 DIAGNOSIS — Z01812 Encounter for preprocedural laboratory examination: Secondary | ICD-10-CM | POA: Diagnosis not present

## 2018-07-14 DIAGNOSIS — J84112 Idiopathic pulmonary fibrosis: Secondary | ICD-10-CM

## 2018-07-14 LAB — SARS CORONAVIRUS 2 (TAT 6-24 HRS): SARS Coronavirus 2: NEGATIVE

## 2018-07-14 MED ORDER — ALPRAZOLAM 0.5 MG PO TABS: 0.2500 mg | ORAL_TABLET | Freq: Two times a day (BID) | ORAL | 1 refills | Status: DC | PRN

## 2018-07-14 NOTE — Addendum Note (Signed)
Addended by: Vicente Males on: 07/14/2018 03:12 PM   Modules accepted: Orders

## 2018-07-14 NOTE — Progress Notes (Signed)
Subjective:    Patient ID: Curtis [name], male    DOB: 1945/05/04, 73 y.o.   MRN: [id number]  Hypertension This is a chronic problem. Pertinent negatives include no chest pain, headaches or shortness of breath. There are no compliance problems.    Pt would like refills on Xanax.  This patient was seen today for chronic pain  The medication list was reviewed and updated.   -Compliance with medication: Hydrocodone 5-325 mg take one tablet by mouth 4 times daily prn moderate pain  - Number patient states they take daily: 3-4  -when was the last dose patient took? This morning  The patient was advised the importance of maintaining medication and not using illegal substances with these.  Here for refills and follow up  The patient was educated that we can provide 3 monthly scripts for their medication, it is their responsibility to follow the instructions.  Side effects or complications from medications: none  Patient is aware that pain medications are meant to minimize the severity of the pain to allow their pain levels to improve to allow for better function. They are aware of that pain medications cannot totally remove their pain.  Due for UDT ( at least once per year) :  States overall breathing been doing okay lately using oxygen intermittently follows with a specialist Keeps up with his heart specialist they will be doing ablation on the atrial fib in the near future   Occasionally patient states he gets worked up regarding his breathing and she states when he takes 1 of his wife's Xanax it helps I told him that it really would not be a good idea for him to be on this medicine on a regular basis but occasionally uses okay  He denies being depressed. Virtual Visit via Video Note  I connected with [facility] on 07/14/18 at  1:10 PM EDT by a video enabled telemedicine application and verified that I am speaking with the correct person using two identifiers.   Location: Patient: home Provider: office   I discussed the limitations of evaluation and management by telemedicine and the availability of in person appointments. The patient expressed understanding and agreed to proceed.  History of Present Illness:    Observations/Objective:   Assessment and Plan:   Follow Up Instructions:    I discussed the assessment and treatment plan with the patient. The patient was provided an opportunity to ask questions and all were answered. The patient agreed with the plan and demonstrated an understanding of the instructions.   The patient was advised to call back or seek an in-person evaluation if the symptoms worsen or if the condition fails to improve as anticipated.  I provided 25 minutes of non-face-to-face time during this encounter.   Vicente Males, LPN      Review of Systems  Constitutional: Negative for activity change.  HENT: Negative for congestion and rhinorrhea.   Respiratory: Negative for cough and shortness of breath.   Cardiovascular: Negative for chest pain.  Gastrointestinal: Negative for abdominal pain, diarrhea, nausea and vomiting.  Genitourinary: Negative for dysuria and hematuria.  Neurological: Negative for weakness and headaches.  Psychiatric/Behavioral: Negative for behavioral problems and confusion.       Objective:   Physical Exam       Today's visit was via telephone Physical exam was not possible for this visit  Assessment & Plan:  1. Essential hypertension HTN- Patient was seen today as part of a visit regarding hypertension. The importance  of healthy diet and regular physical activity was discussed. The importance of compliance with medications discussed.  Ideal goal is to keep blood pressure low elevated levels certainly below 559/74 when possible.  The patient was counseled that keeping blood pressure under control lessen his risk of complications.  The importance of regular follow-ups was  discussed with the patient.  Low-salt diet such as DASH recommended.  Regular physical activity was recommended as well.  Patient was advised to keep regular follow-ups.   2. IPF (idiopathic pulmonary fibrosis) ([place]) Patient with pulmonary fibrosis follows with specialist on a regular basis uses oxygen intermittently works within his limits  3. Chronic bilateral low back pain without sciatica Chronic low back pain uses pain medication does not abuse it needs 3 additional prescription sent in today  4. Hyperbilirubinemia Will check lab work liver function as well as cholesterol  5. Dyslipidemia Healthy diet recommended check lab work await results  Occasionally has anxiety related issues.  Uses Xanax infrequently he knows not to drive when taking it Patient will be doing lab work we will communicate results to him when it comes in

## 2018-07-15 NOTE — Anesthesia Preprocedure Evaluation (Deleted)
Anesthesia Evaluation    Reviewed: Allergy & Precautions, Patient's Chart, lab work & pertinent test results  Airway        Dental   Pulmonary asthma , former smoker,  Pulmonary fibrosis--requires O2          Cardiovascular hypertension, Pt. on medications + Past MI  + dysrhythmias Atrial Fibrillation      Neuro/Psych  Neuromuscular disease negative psych ROS   GI/Hepatic Neg liver ROS, GERD  ,  Endo/Other  negative endocrine ROS  Renal/GU negative Renal ROS     Musculoskeletal  (+) Arthritis ,   Abdominal   Peds  Hematology Eliquis    Anesthesia Other Findings Day of surgery medications reviewed with the patient.  Reproductive/Obstetrics                             Anesthesia Physical Anesthesia Plan Anesthesia Quick Evaluation

## 2018-07-16 ENCOUNTER — Telehealth: Payer: Self-pay | Admitting: Cardiology

## 2018-07-16 ENCOUNTER — Encounter (HOSPITAL_COMMUNITY): Payer: Self-pay | Admitting: Anesthesiology

## 2018-07-16 NOTE — Telephone Encounter (Signed)
  Patient is having ablation tomorrow and would like to speak to the nurse to ask some questions

## 2018-07-16 NOTE — Telephone Encounter (Signed)
Pt advised according to his 6/29 instructions no meds morning of his procedure... he is going into the hospital at 5:30 am.

## 2018-07-16 NOTE — Anesthesia Preprocedure Evaluation (Addendum)
Anesthesia Evaluation  Patient identified by MRN, date of birth, ID band Patient awake    Reviewed: Allergy & Precautions, NPO status , Patient's Chart, lab work & pertinent test results  Airway Mallampati: II  TM Distance: >3 FB Neck ROM: Full    Dental no notable dental hx. (+) Teeth Intact, Caps, Dental Advisory Given, Chipped,    Pulmonary shortness of breath, with exertion, at rest and Long-Term Oxygen Therapy, asthma , pneumonia, resolved, former smoker,  Idiopathic Pulmonary Fibrosis  Crackles left lung fields     rales    Cardiovascular hypertension, Pt. on medications + Past MI  + dysrhythmias Atrial Fibrillation  Rhythm:Irregular Rate:Normal  Persistent atrial fibrillation   Neuro/Psych Trigeminal neuralgia-right Hx/o Glaucoma  Neuromuscular disease negative psych ROS   GI/Hepatic Neg liver ROS, GERD  Medicated and Controlled,  Endo/Other  negative endocrine ROSHyperlipidemia- intolerant to statins  Renal/GU negative Renal ROS  negative genitourinary   Musculoskeletal  (+) Arthritis , Psoriasis Hx/o cervical fusion   Abdominal (+) + obese,   Peds  Hematology negative hematology ROS (+) xarelto - last dose yesterday    Anesthesia Other Findings   Reproductive/Obstetrics                         Anesthesia Physical Anesthesia Plan  ASA: III  Anesthesia Plan: General   Post-op Pain Management:    Induction: Intravenous  PONV Risk Score and Plan: 2 and Ondansetron  Airway Management Planned: Oral ETT  Additional Equipment:   Intra-op Plan:   Post-operative Plan: Extubation in OR  Informed Consent: I have reviewed the patients History and Physical, chart, labs and discussed the procedure including the risks, benefits and alternatives for the proposed anesthesia with the patient or authorized representative who has indicated his/her understanding and acceptance.      Dental advisory given  Plan Discussed with: CRNA  Anesthesia Plan Comments:       Anesthesia Quick Evaluation

## 2018-07-17 ENCOUNTER — Encounter (HOSPITAL_COMMUNITY)
Admission: RE | Disposition: A | Discharge status: Home / Self Care | Payer: Self-pay | Source: Home / Self Care | Attending: Cardiology

## 2018-07-17 ENCOUNTER — Ambulatory Visit (HOSPITAL_COMMUNITY): Payer: PPO | Admitting: Anesthesiology

## 2018-07-17 ENCOUNTER — Other Ambulatory Visit: Payer: Self-pay

## 2018-07-17 ENCOUNTER — Ambulatory Visit (HOSPITAL_COMMUNITY)
Admission: RE | Admit: 2018-07-17 | Discharge: 2018-07-17 | Disposition: A | Discharge status: Home / Self Care | Payer: PPO | Attending: Cardiology | Admitting: Cardiology

## 2018-07-17 DIAGNOSIS — I4891 Unspecified atrial fibrillation: Secondary | ICD-10-CM | POA: Diagnosis not present

## 2018-07-17 DIAGNOSIS — K219 Gastro-esophageal reflux disease without esophagitis: Secondary | ICD-10-CM | POA: Diagnosis not present

## 2018-07-17 DIAGNOSIS — J45909 Unspecified asthma, uncomplicated: Secondary | ICD-10-CM | POA: Insufficient documentation

## 2018-07-17 DIAGNOSIS — J84112 Idiopathic pulmonary fibrosis: Secondary | ICD-10-CM | POA: Diagnosis not present

## 2018-07-17 DIAGNOSIS — Z79899 Other long term (current) drug therapy: Secondary | ICD-10-CM | POA: Diagnosis not present

## 2018-07-17 DIAGNOSIS — I252 Old myocardial infarction: Secondary | ICD-10-CM | POA: Diagnosis not present

## 2018-07-17 DIAGNOSIS — I10 Essential (primary) hypertension: Secondary | ICD-10-CM | POA: Insufficient documentation

## 2018-07-17 DIAGNOSIS — M1990 Unspecified osteoarthritis, unspecified site: Secondary | ICD-10-CM | POA: Insufficient documentation

## 2018-07-17 DIAGNOSIS — E785 Hyperlipidemia, unspecified: Secondary | ICD-10-CM | POA: Diagnosis not present

## 2018-07-17 DIAGNOSIS — Z87891 Personal history of nicotine dependence: Secondary | ICD-10-CM | POA: Diagnosis not present

## 2018-07-17 DIAGNOSIS — Z7901 Long term (current) use of anticoagulants: Secondary | ICD-10-CM | POA: Insufficient documentation

## 2018-07-17 DIAGNOSIS — I4819 Other persistent atrial fibrillation: Secondary | ICD-10-CM | POA: Insufficient documentation

## 2018-07-17 HISTORY — PX: ATRIAL FIBRILLATION ABLATION: EP1191

## 2018-07-17 LAB — BASIC METABOLIC PANEL
Anion gap: 10 (ref 5–15)
BUN: 9 mg/dL (ref 8–23)
CO2: 23 mmol/L (ref 22–32)
Calcium: 8.8 mg/dL — ABNORMAL LOW (ref 8.9–10.3)
Chloride: 106 mmol/L (ref 98–111)
Creatinine, Ser: 1.07 mg/dL (ref 0.61–1.24)
GFR calc Af Amer: >60 mL/min (ref >60)
GFR calc non Af Amer: >60 mL/min (ref >60)
Glucose, Bld: 88 mg/dL (ref 70–99)
Potassium: 3.9 mmol/L (ref 3.5–5.1)
Sodium: 139 mmol/L (ref 135–145)

## 2018-07-17 LAB — CBC
HCT: 47.6 % (ref 39.0–52.0)
Hemoglobin: 15.5 g/dL (ref 13.0–17.0)
MCH: 30.6 pg (ref 26.0–34.0)
MCHC: 32.6 g/dL (ref 30.0–36.0)
MCV: 94.1 fL (ref 80.0–100.0)
Platelets: 121 10*3/uL — ABNORMAL LOW (ref 150–400)
RBC: 5.06 MIL/uL (ref 4.22–5.81)
RDW: 12.6 % (ref 11.5–15.5)
WBC: 9.8 10*3/uL (ref 4.0–10.5)
nRBC: 0 % (ref 0.0–0.2)

## 2018-07-17 LAB — POCT ACTIVATED CLOTTING TIME
Activated Clotting Time: 373 seconds
Activated Clotting Time: 147 seconds

## 2018-07-17 SURGERY — ATRIAL FIBRILLATION ABLATION
Anesthesia: General

## 2018-07-17 MED ORDER — HEPARIN (PORCINE) IN NACL 1000-0.9 UT/500ML-% IV SOLN
INTRAVENOUS | Status: AC
  Filled 2018-07-17: qty 500

## 2018-07-17 MED ORDER — HYDROCODONE-ACETAMINOPHEN 5-325 MG PO TABS
ORAL_TABLET | ORAL | Status: AC
  Filled 2018-07-17: qty 1

## 2018-07-17 MED ORDER — SODIUM CHLORIDE 0.9% FLUSH: 3.0000 mL | Freq: Two times a day (BID) | INTRAVENOUS | Status: DC

## 2018-07-17 MED ORDER — HEPARIN (PORCINE) IN NACL 1000-0.9 UT/500ML-% IV SOLN
INTRAVENOUS | Status: DC | PRN
  Administered 2018-07-17 (×5): 500 mL

## 2018-07-17 MED ORDER — ACETAMINOPHEN 325 MG PO TABS: 650.0000 mg | ORAL_TABLET | ORAL | Status: DC | PRN

## 2018-07-17 MED ORDER — SODIUM CHLORIDE 0.9 % IV SOLN
INTRAVENOUS | Status: DC
  Administered 2018-07-17: 06:00:00 via INTRAVENOUS

## 2018-07-17 MED ORDER — PHENYLEPHRINE HCL (PRESSORS) 10 MG/ML IV SOLN
INTRAVENOUS | Status: DC | PRN
  Administered 2018-07-17 (×2): 80 ug via INTRAVENOUS

## 2018-07-17 MED ORDER — HEPARIN SODIUM (PORCINE) 1000 UNIT/ML IJ SOLN
INTRAMUSCULAR | Status: DC | PRN
  Administered 2018-07-17: 15000 [IU] via INTRAVENOUS
  Administered 2018-07-17: 1000 [IU] via INTRAVENOUS

## 2018-07-17 MED ORDER — SODIUM CHLORIDE 0.9 % IV SOLN: 250.0000 mL | INTRAVENOUS | Status: DC | PRN

## 2018-07-17 MED ORDER — PROPOFOL 10 MG/ML IV BOLUS
INTRAVENOUS | Status: DC | PRN
  Administered 2018-07-17: 110 mg via INTRAVENOUS

## 2018-07-17 MED ORDER — ONDANSETRON HCL 4 MG/2ML IJ SOLN: 4.0000 mg | Freq: Four times a day (QID) | INTRAMUSCULAR | Status: DC | PRN

## 2018-07-17 MED ORDER — HEPARIN SODIUM (PORCINE) 1000 UNIT/ML IJ SOLN
INTRAMUSCULAR | Status: DC | PRN
  Administered 2018-07-17: 15000 [IU] via INTRAVENOUS

## 2018-07-17 MED ORDER — HEPARIN SODIUM (PORCINE) 1000 UNIT/ML IJ SOLN
INTRAMUSCULAR | Status: AC
  Filled 2018-07-17: qty 1

## 2018-07-17 MED ORDER — PROTAMINE SULFATE 10 MG/ML IV SOLN
INTRAVENOUS | Status: DC | PRN
  Administered 2018-07-17 (×5): 10 mg via INTRAVENOUS

## 2018-07-17 MED ORDER — ADENOSINE 6 MG/2ML IV SOLN
INTRAVENOUS | Status: AC
  Filled 2018-07-17: qty 2

## 2018-07-17 MED ORDER — DOBUTAMINE IN D5W 4-5 MG/ML-% IV SOLN
INTRAVENOUS | Status: AC
  Filled 2018-07-17: qty 250

## 2018-07-17 MED ORDER — BUPIVACAINE HCL (PF) 0.25 % IJ SOLN
INTRAMUSCULAR | Status: AC
  Filled 2018-07-17: qty 60

## 2018-07-17 MED ORDER — ADENOSINE 6 MG/2ML IV SOLN
INTRAVENOUS | Status: AC
  Filled 2018-07-17: qty 8

## 2018-07-17 MED ORDER — ROCURONIUM BROMIDE 50 MG/5ML IV SOSY
PREFILLED_SYRINGE | INTRAVENOUS | Status: DC | PRN
  Administered 2018-07-17: 50 mg via INTRAVENOUS

## 2018-07-17 MED ORDER — BUPIVACAINE HCL (PF) 0.25 % IJ SOLN
INTRAMUSCULAR | Status: DC | PRN
  Administered 2018-07-17: 60 mL

## 2018-07-17 MED ORDER — SODIUM CHLORIDE 0.9 % IV SOLN
INTRAVENOUS | Status: DC | PRN
  Administered 2018-07-17: 08:00:00 20 ug/min via INTRAVENOUS

## 2018-07-17 MED ORDER — FENTANYL CITRATE (PF) 250 MCG/5ML IJ SOLN
INTRAMUSCULAR | Status: AC
  Filled 2018-07-17: qty 5

## 2018-07-17 MED ORDER — SUGAMMADEX SODIUM 200 MG/2ML IV SOLN
INTRAVENOUS | Status: DC | PRN
  Administered 2018-07-17: 200 mg via INTRAVENOUS

## 2018-07-17 MED ORDER — ADENOSINE 6 MG/2ML IV SOLN
INTRAVENOUS | Status: DC | PRN
  Administered 2018-07-17 (×3): 12 mg via INTRAVENOUS

## 2018-07-17 MED ORDER — LIDOCAINE 2% (20 MG/ML) 5 ML SYRINGE
INTRAMUSCULAR | Status: DC | PRN
  Administered 2018-07-17: 60 mg via INTRAVENOUS

## 2018-07-17 MED ORDER — SODIUM CHLORIDE 0.9% FLUSH: 3.0000 mL | INTRAVENOUS | Status: DC | PRN

## 2018-07-17 MED ORDER — HYDROCODONE-ACETAMINOPHEN 5-325 MG PO TABS
1.0000 | ORAL_TABLET | ORAL | Status: DC | PRN
  Administered 2018-07-17: 1 via ORAL

## 2018-07-17 MED ORDER — FENTANYL CITRATE (PF) 250 MCG/5ML IJ SOLN
INTRAMUSCULAR | Status: DC | PRN
  Administered 2018-07-17 (×3): 50 ug via INTRAVENOUS

## 2018-07-17 MED ORDER — ONDANSETRON HCL 4 MG/2ML IJ SOLN
INTRAMUSCULAR | Status: DC | PRN
  Administered 2018-07-17: 4 mg via INTRAVENOUS

## 2018-07-17 MED ORDER — LACTATED RINGERS IV SOLN
INTRAVENOUS | Status: DC | PRN
  Administered 2018-07-17: 07:00:00 via INTRAVENOUS

## 2018-07-17 MED ORDER — DOBUTAMINE IN D5W 4-5 MG/ML-% IV SOLN
INTRAVENOUS | Status: DC | PRN
  Administered 2018-07-17: 20 ug/kg/min via INTRAVENOUS

## 2018-07-17 MED ORDER — ALBUTEROL SULFATE HFA 108 (90 BASE) MCG/ACT IN AERS
INHALATION_SPRAY | RESPIRATORY_TRACT | Status: DC | PRN
  Administered 2018-07-17: 2 via RESPIRATORY_TRACT

## 2018-07-17 SURGICAL SUPPLY — 20 items
BLANKET WARM UNDERBOD FULL ACC (MISCELLANEOUS) ×3 IMPLANT
CATH MAPPNG PENTARAY F 2-6-2MM (CATHETERS) ×1 IMPLANT
CATH SMTCH THERMOCOOL SF DF (CATHETERS) ×3 IMPLANT
CATH SOUNDSTAR ECO REPROCESSED (CATHETERS) ×3 IMPLANT
CATH WEBSTER BI DIR CS D-F CRV (CATHETERS) ×3 IMPLANT
COVER SWIFTLINK CONNECTOR (BAG) ×3 IMPLANT
PACK EP LATEX FREE (CUSTOM PROCEDURE TRAY) ×2
PACK EP LF (CUSTOM PROCEDURE TRAY) ×1 IMPLANT
PAD PRO RADIOLUCENT 2001M-C (PAD) ×3 IMPLANT
PATCH CARTO3 (PAD) ×3 IMPLANT
PENTARAY F 2-6-2MM (CATHETERS) ×3
SHEATH AVANTI 11F 11CM (SHEATH) ×3 IMPLANT
SHEATH BAYLIS SUREFLEX  M 8.5 (SHEATH) ×4
SHEATH BAYLIS SUREFLEX M 8.5 (SHEATH) ×2 IMPLANT
SHEATH BAYLIS TRANSSEPTAL 98CM (NEEDLE) ×3 IMPLANT
SHEATH PINNACLE 7F 10CM (SHEATH) ×3 IMPLANT
SHEATH PINNACLE 8F 10CM (SHEATH) ×6 IMPLANT
SHEATH PINNACLE 9F 10CM (SHEATH) ×3 IMPLANT
SHEATH PINNACLE VASC 9FR (SHEATH) ×3 IMPLANT
TUBING SMART ABLATE COOLFLOW (TUBING) ×3 IMPLANT

## 2018-07-17 NOTE — Discharge Instructions (Signed)
Post procedure care instructions No driving for 4 days. No lifting over 5 lbs for 1 week. No vigorous or sexual activity for 1 week. You may return to work on 07/24/2018. Keep procedure site clean & dry. If you notice increased pain, swelling, bleeding or pus, call/return!  You may shower, but no soaking baths/hot tubs/pools for 1 week.    You have an appointment set up with the [facility].  Multiple studies have shown that being followed by a dedicated atrial fibrillation clinic in addition to the standard care you receive from your other physicians improves health. We believe that enrollment in the atrial fibrillation clinic will allow [place] to better care for you.   The phone number to the [facility] is [phone]. The clinic is staffed Monday through Friday from 8:30am to 5pm.  Parking Directions: The clinic is located in the Heart and Vascular Building connected to [facility]. 1)From [address] turn on to [address] and go to the 3rd entrance  (Heart and Vascular entrance) on the right. 2)Look to the right for Heart &Vascular Parking Garage. 3)A code for the entrance is required please call the clinic to receive this.   4)Take the elevators to the 1st floor. Registration is in the room with the glass walls at the end of the hallway.  If you have any trouble parking or locating the clinic, please dont hesitate to call [phone].

## 2018-07-17 NOTE — Progress Notes (Addendum)
Site area: Left groin a 7 and 11 french venous sheath was removed Patria Mane RN  Site Prior to Removal:  Level 0  Pressure Applied For 20 MINUTES    Bedrest Beginning at 1100am  Manual:   Yes.    Patient Status During Pull:  stable  Post Pull Groin Site:  Level 0  Post Pull Instructions Given:  Yes.    Post Pull Pulses Present:  Yes.    Dressing Applied:  Yes.    Comments:

## 2018-07-17 NOTE — Anesthesia Postprocedure Evaluation (Signed)
Anesthesia Post Note  Patient: Curtis [name]  Procedure(s) Performed: ATRIAL FIBRILLATION ABLATION (N/A )     Patient location during evaluation: PACU Anesthesia Type: General Level of consciousness: awake and alert and oriented Pain management: pain level controlled Vital Signs Assessment: post-procedure vital signs reviewed and stable Respiratory status: spontaneous breathing, nonlabored ventilation and respiratory function stable Cardiovascular status: blood pressure returned to baseline and stable Postop Assessment: no apparent nausea or vomiting Anesthetic complications: no    Last Vitals:  Vitals:   07/17/18 1025 07/17/18 1030  BP: 112/75 117/64  Pulse: 76 62  Resp: 17 17  Temp:    SpO2: 93% 92%    Last Pain:  Vitals:   07/17/18 1008  TempSrc: Temporal  PainSc: 9                  Minna Dumire A.

## 2018-07-17 NOTE — Progress Notes (Addendum)
Site area: Right groin a 9 french X 2 venous sheath was removed.  Site Prior to Removal:  Level 0  Pressure Applied For 20 MINUTES    Bedrest Beginning at 1100am  Manual:   Yes.    Patient Status During Pull:  stable  Post Pull Groin Site:  Level 0  Post Pull Instructions Given:  Yes.    Post Pull Pulses Present:  Yes.    Dressing Applied:  Yes.    Comments:

## 2018-07-17 NOTE — Transfer of Care (Signed)
Immediate Anesthesia Transfer of Care Note  Patient: Curtis [name]  Procedure(s) Performed: ATRIAL FIBRILLATION ABLATION (N/A )  Patient Location: Cath Lab  Anesthesia Type:General  Level of Consciousness: awake, oriented, patient cooperative and responds to stimulation  Airway & Oxygen Therapy: Patient Spontanous Breathing and Patient connected to face mask oxygen  Post-op Assessment: Report given to RN and Post -op Vital signs reviewed and stable  Post vital signs: Reviewed and stable  Last Vitals:  Vitals Value Taken Time  BP 122/62 07/17/18 1003  Temp    Pulse 80 07/17/18 1006  Resp 17 07/17/18 1006  SpO2 94 % 07/17/18 1006  Vitals shown include unvalidated device data.  Last Pain:  Vitals:   07/17/18 1004  TempSrc:   PainSc: 0-No pain      Patients Stated Pain Goal: 3 (56/94/37 0052)  Complications: No apparent anesthesia complications

## 2018-07-17 NOTE — Anesthesia Procedure Notes (Signed)
Procedure Name: Intubation Date/Time: 07/17/2018 7:37 AM Performed by: Glynda Jaeger, CRNA Pre-anesthesia Checklist: Patient identified, Patient being monitored, Timeout performed, Emergency Drugs available and Suction available Patient Re-evaluated:Patient Re-evaluated prior to induction Oxygen Delivery Method: Circle System Utilized Preoxygenation: Pre-oxygenation with 100% oxygen Induction Type: IV induction Ventilation: Mask ventilation without difficulty Laryngoscope Size: Mac and 4 Grade View: Grade II Tube type: Oral Tube size: 7.5 mm Number of attempts: 1 Airway Equipment and Method: Stylet Placement Confirmation: ETT inserted through vocal cords under direct vision,  positive ETCO2 and breath sounds checked- equal and bilateral Secured at: 22 cm Tube secured with: Tape Dental Injury: Teeth and Oropharynx as per pre-operative assessment

## 2018-07-17 NOTE — H&P (Signed)
Curtis [name] has presented today for surgery, with the diagnosis of atrial fibrillation.  The various methods of treatment have been discussed with the patient and family. After consideration of risks, benefits and other options for treatment, the patient has consented to  Procedure(s): Catheter ablation as a surgical intervention .  Risks include but not limited to bleeding, tamponade, heart block, stroke, damage to surrounding organs, among others. The patient's history has been reviewed, patient examined, no change in status, stable for surgery.  I have reviewed the patient's chart and labs.  Questions were answered to the patient's satisfaction.    Mersades Barbaro Curt Bears, MD 07/17/2018 7:12 AM

## 2018-07-20 ENCOUNTER — Encounter (HOSPITAL_COMMUNITY): Payer: Self-pay | Admitting: Cardiology

## 2018-07-21 ENCOUNTER — Other Ambulatory Visit (HOSPITAL_COMMUNITY): Payer: Self-pay | Admitting: Cardiology

## 2018-07-21 DIAGNOSIS — I4819 Other persistent atrial fibrillation: Secondary | ICD-10-CM

## 2018-07-22 ENCOUNTER — Telehealth: Payer: Self-pay | Admitting: Family Medicine

## 2018-07-22 ENCOUNTER — Other Ambulatory Visit (HOSPITAL_COMMUNITY): Payer: Self-pay | Admitting: *Deleted

## 2018-07-22 DIAGNOSIS — I4819 Other persistent atrial fibrillation: Secondary | ICD-10-CM

## 2018-07-22 MED ORDER — HYDROCODONE-ACETAMINOPHEN 5-325 MG PO TABS: ORAL_TABLET | ORAL | 0 refills | Status: DC

## 2018-07-22 MED ORDER — ALPRAZOLAM 0.5 MG PO TABS: 0.2500 mg | ORAL_TABLET | Freq: Two times a day (BID) | ORAL | 2 refills | Status: AC | PRN

## 2018-07-22 MED ORDER — HYDROCODONE-ACETAMINOPHEN 5-325 MG PO TABS: 1.0000 | ORAL_TABLET | Freq: Four times a day (QID) | ORAL | 0 refills | Status: DC | PRN

## 2018-07-22 MED ORDER — DILTIAZEM HCL ER COATED BEADS 120 MG PO CP24: ORAL_CAPSULE | ORAL | 5 refills | Status: AC

## 2018-07-22 NOTE — Telephone Encounter (Signed)
Since dr Nicki Reaper cking daily plz send this to him

## 2018-07-22 NOTE — Telephone Encounter (Signed)
Patient called back and is very upset this was not sent in at his office visit last week on the 7th like he was told it would be

## 2018-07-22 NOTE — Telephone Encounter (Signed)
This occurred eight days ago, scott nd I are trying to share some responsibilites so the only doc here does not get unduely slammed. I will attempt to go into scotts but I am not sure if I can sign with my authorization rx's written in his name !

## 2018-07-22 NOTE — Telephone Encounter (Signed)
I went into the visit from July 7 All of these orders were signed today Please notify patient My sincere apologies Somehow the documentation for that day was marked as signed when in fact the orders were still pending Please also let Dr. Richardson Landry know that this issue has been handled

## 2018-07-22 NOTE — Telephone Encounter (Signed)
Pt called to say he's out of his HYDROcodone-acetaminophen (NORCO/VICODIN) 5-325 MG tablet, had visit with Dr. Nicki Reaper on 07/14/2018 & Dr. Nicki Reaper told pt that he would send in the 3 monthly prescriptions - was due to refill 07/21/2018 & when the pt's wife went to pharmacy [facility]) she was told we had not sent over any refills for that medicine  Please advise & call pt when done - pt is out of this medicine

## 2018-07-22 NOTE — Addendum Note (Signed)
Addended by: Sallee Lange A on: 07/22/2018 01:28 PM   Modules accepted: Orders

## 2018-07-22 NOTE — Telephone Encounter (Signed)
The prescriptions are pended and awaiting signature in the 07/14/18 visit

## 2018-07-22 NOTE — Telephone Encounter (Signed)
Patient notified and verbalized understanding. 

## 2018-07-25 ENCOUNTER — Other Ambulatory Visit (HOSPITAL_COMMUNITY): Payer: Self-pay | Admitting: Cardiology

## 2018-07-27 ENCOUNTER — Other Ambulatory Visit (HOSPITAL_COMMUNITY): Payer: Self-pay | Admitting: *Deleted

## 2018-07-27 MED ORDER — APIXABAN 5 MG PO TABS: 5.0000 mg | ORAL_TABLET | Freq: Two times a day (BID) | ORAL | 1 refills | Status: AC

## 2018-07-27 NOTE — Telephone Encounter (Signed)
Eliquis 5mg  refill request received; pt is 73 yrs old, wt-104.3kg, Crea-1.07 07/17/2018, last Telemedicine on 06/30/2018 with Dr. Curt Bears; will send in refill to requested pharmacy.

## 2018-07-30 DIAGNOSIS — H401434 Capsular glaucoma with pseudoexfoliation of lens, bilateral, indeterminate stage: Secondary | ICD-10-CM | POA: Diagnosis not present

## 2018-07-31 DIAGNOSIS — H02886 Meibomian gland dysfunction of left eye, unspecified eyelid: Secondary | ICD-10-CM | POA: Diagnosis not present

## 2018-07-31 DIAGNOSIS — H02883 Meibomian gland dysfunction of right eye, unspecified eyelid: Secondary | ICD-10-CM | POA: Diagnosis not present

## 2018-07-31 DIAGNOSIS — Z8669 Personal history of other diseases of the nervous system and sense organs: Secondary | ICD-10-CM | POA: Diagnosis not present

## 2018-07-31 DIAGNOSIS — H401434 Capsular glaucoma with pseudoexfoliation of lens, bilateral, indeterminate stage: Secondary | ICD-10-CM | POA: Diagnosis not present

## 2018-07-31 DIAGNOSIS — Z961 Presence of intraocular lens: Secondary | ICD-10-CM | POA: Diagnosis not present

## 2018-07-31 DIAGNOSIS — H27132 Posterior dislocation of lens, left eye: Secondary | ICD-10-CM | POA: Diagnosis not present

## 2018-08-04 DIAGNOSIS — Z961 Presence of intraocular lens: Secondary | ICD-10-CM | POA: Diagnosis not present

## 2018-08-04 DIAGNOSIS — H3581 Retinal edema: Secondary | ICD-10-CM | POA: Diagnosis not present

## 2018-08-04 DIAGNOSIS — H27132 Posterior dislocation of lens, left eye: Secondary | ICD-10-CM | POA: Diagnosis not present

## 2018-08-04 DIAGNOSIS — H401434 Capsular glaucoma with pseudoexfoliation of lens, bilateral, indeterminate stage: Secondary | ICD-10-CM | POA: Diagnosis not present

## 2018-08-13 ENCOUNTER — Emergency Department (HOSPITAL_COMMUNITY)
Admission: EM | Admit: 2018-08-13 | Discharge: 2018-08-13 | Disposition: A | Discharge status: Home / Self Care | Payer: PPO | Attending: Emergency Medicine | Admitting: Emergency Medicine

## 2018-08-13 ENCOUNTER — Encounter (HOSPITAL_COMMUNITY): Payer: Self-pay | Admitting: Emergency Medicine

## 2018-08-13 ENCOUNTER — Other Ambulatory Visit: Payer: Self-pay

## 2018-08-13 ENCOUNTER — Emergency Department (HOSPITAL_COMMUNITY): Payer: PPO

## 2018-08-13 DIAGNOSIS — Z79899 Other long term (current) drug therapy: Secondary | ICD-10-CM | POA: Insufficient documentation

## 2018-08-13 DIAGNOSIS — Z7901 Long term (current) use of anticoagulants: Secondary | ICD-10-CM | POA: Insufficient documentation

## 2018-08-13 DIAGNOSIS — Z87891 Personal history of nicotine dependence: Secondary | ICD-10-CM | POA: Insufficient documentation

## 2018-08-13 DIAGNOSIS — I252 Old myocardial infarction: Secondary | ICD-10-CM | POA: Insufficient documentation

## 2018-08-13 DIAGNOSIS — R079 Chest pain, unspecified: Secondary | ICD-10-CM | POA: Insufficient documentation

## 2018-08-13 DIAGNOSIS — J45909 Unspecified asthma, uncomplicated: Secondary | ICD-10-CM | POA: Insufficient documentation

## 2018-08-13 DIAGNOSIS — R0789 Other chest pain: Secondary | ICD-10-CM | POA: Diagnosis not present

## 2018-08-13 DIAGNOSIS — I10 Essential (primary) hypertension: Secondary | ICD-10-CM | POA: Diagnosis not present

## 2018-08-13 LAB — BASIC METABOLIC PANEL
Anion gap: 8 (ref 5–15)
BUN: 10 mg/dL (ref 8–23)
CO2: 28 mmol/L (ref 22–32)
Calcium: 8.7 mg/dL — ABNORMAL LOW (ref 8.9–10.3)
Chloride: 101 mmol/L (ref 98–111)
Creatinine, Ser: 0.83 mg/dL (ref 0.61–1.24)
GFR calc Af Amer: >60 mL/min (ref >60)
GFR calc non Af Amer: >60 mL/min (ref >60)
Glucose, Bld: 89 mg/dL (ref 70–99)
Potassium: 4.1 mmol/L (ref 3.5–5.1)
Sodium: 137 mmol/L (ref 135–145)

## 2018-08-13 LAB — CBC
HCT: 47.2 % (ref 39.0–52.0)
Hemoglobin: 15.3 g/dL (ref 13.0–17.0)
MCH: 30.1 pg (ref 26.0–34.0)
MCHC: 32.4 g/dL (ref 30.0–36.0)
MCV: 92.9 fL (ref 80.0–100.0)
Platelets: 124 10*3/uL — ABNORMAL LOW (ref 150–400)
RBC: 5.08 MIL/uL (ref 4.22–5.81)
RDW: 12.3 % (ref 11.5–15.5)
WBC: 11.3 10*3/uL — ABNORMAL HIGH (ref 4.0–10.5)
nRBC: 0 % (ref 0.0–0.2)

## 2018-08-13 LAB — CBG MONITORING, ED: Glucose-Capillary: 85 mg/dL (ref 70–99)

## 2018-08-13 LAB — D-DIMER, QUANTITATIVE: D-Dimer, Quant: 0.37 ug/mL-FEU (ref 0.00–0.50)

## 2018-08-13 LAB — TROPONIN I (HIGH SENSITIVITY): Troponin I (High Sensitivity): 7 ng/L (ref <18)

## 2018-08-13 MED ORDER — MORPHINE SULFATE (PF) 4 MG/ML IV SOLN
4.0000 mg | Freq: Once | INTRAVENOUS | Status: AC
  Administered 2018-08-13: 4 mg via INTRAVENOUS
  Filled 2018-08-13: qty 1

## 2018-08-13 MED ORDER — ONDANSETRON HCL 4 MG/2ML IJ SOLN
4.0000 mg | Freq: Once | INTRAMUSCULAR | Status: AC
  Administered 2018-08-13: 4 mg via INTRAVENOUS
  Filled 2018-08-13: qty 2

## 2018-08-13 MED ORDER — ASPIRIN 81 MG PO CHEW
324.0000 mg | CHEWABLE_TABLET | Freq: Once | ORAL | Status: DC
  Filled 2018-08-13: qty 4

## 2018-08-13 NOTE — ED Provider Notes (Signed)
[facility] EMERGENCY DEPARTMENT Provider Note   CSN: [id number] Arrival date & time: 08/13/18  1712    History   Chief Complaint Chief Complaint  Patient presents with  . Chest Pain    HPI Curtis [name] is a 73 y.o. male with a history of atrial fibrillation, s/p ablation 07/17/18, asthma, MI, dyslipidemia, gerd, HTN, and pulmonary fibrosis presenting with shortness of breath and chest pain. He describes mild intermittent chest pain since his ablation on July 10, but today, suddenly around 1 pm it became severe and constant with radiation into his back. Non exertional, had just taken a bath when the sx worsened.  He reports pleuritic pain and difficulty taking a deep breath because of the pain.  He has had no n/v, abd pain, also denies fevers or any increased cough (chronic with pulmonary fibrosis), no peripheral edema or pain.  He took a hydrocodone prior to arrival with no improvement in sx.   HPI: A 73 year old patient with a history of hypertension and hypercholesterolemia presents for evaluation of chest pain. Initial onset of pain was approximately 3-6 hours ago. The patient's chest pain is well-localized, is sharp and is not worse with exertion. The patient's chest pain is middle- or left-sided, is not described as heaviness/pressure/tightness and does not radiate to the arms/jaw/neck. The patient does not complain of nausea and denies diaphoresis. The patient has no history of stroke, has no history of peripheral artery disease, has not smoked in the past 90 days, denies any history of treated diabetes, has no relevant family history of coronary artery disease (first degree relative at less than age 80) and does not have an elevated BMI (>=30).   The history is provided by the patient.    Past Medical History:  Diagnosis Date  . Arthritis   . Asthma   . Atrial fibrillation ([place])   . Cataract   . Cervical spondylosis   . Chest pain    Emergency room October 24, 2011, no MI   //   Nuclear, October, 2013, adenosine, EF 61%, no scar or ischemia  . Dyslipidemia    Statin intolerant  . Dyspnea   . GERD (gastroesophageal reflux disease)   . Glaucoma   . Hearing loss   . Hypertension   . Hypertension   . Idiopathic pulmonary fibrosis ([place])   . Interstitial pulmonary fibrosis ([place]) 2012  . Left shoulder pain 2011  . Myocardial infarction ([place])   . Psoriasis   . Pulmonary fibrosis ([place])   . Statin intolerance     Patient Active Problem List   Diagnosis Date Noted  . Impingement syndrome of right shoulder 12/12/2016  . AC (acromioclavicular) arthritis 12/12/2016  . Partial tear of right rotator cuff 12/12/2016  . Chronic respiratory failure ([place]) 04/26/2016  . Hyperbilirubinemia 04/26/2016  . H/O asbestos exposure 03/15/2016  . Persistent atrial fibrillation 02/19/2016  . Atrial fibrillation, persistent 02/19/2016  . IPF (idiopathic pulmonary fibrosis) ([place]) 02/09/2016  . Incidental lung nodule, greater than or equal to 69mm 02/09/2016  . Bell's palsy 08/03/2015  . Insomnia 06/08/2015  . Hypertension 08/18/2014  . Dyslipidemia 08/18/2014  . Gastroesophageal reflux disease 08/18/2014  . Trigeminal neuralgia of right side of face 07/05/2014  . Right trigeminal neuralgia 04/01/2014  . Chronic back pain 03/01/2014  . Elevated transaminase level 06/25/2013  . Elevation of level of transaminase or lactic acid dehydrogenase (LDH) 06/25/2013  . Interstitial pulmonary disease, unspecified ([place]) 01/22/2013  . Obesity (BMI 30-39.9) 09/09/2012  . Chronic neck  pain 09/09/2012  . Body mass index (BMI) greater than 30 in adult 09/09/2012  . Symptomatic cholelithiasis 04/14/2012  . Chest pain   . Statin intolerance   . INTERSTITIAL LUNG DISEASE 03/16/2010  . Psoriasis 03/16/2010    Past Surgical History:  Procedure Laterality Date  . ATRIAL FIBRILLATION ABLATION N/A 02/18/2018   Procedure: ATRIAL FIBRILLATION ABLATION;  Surgeon: Constance Haw, MD;   Location: [facility];  Service: Cardiovascular;  Laterality: N/A;  . ATRIAL FIBRILLATION ABLATION N/A 07/17/2018   Procedure: ATRIAL FIBRILLATION ABLATION;  Surgeon: Constance Haw, MD;  Location: [facility];  Service: Cardiovascular;  Laterality: N/A;  . BRAIN SURGERY    . CARDIOVERSION N/A 12/13/2015   Procedure: CARDIOVERSION;  Surgeon: Josue Hector, MD;  Location: [facility];  Service: Cardiovascular;  Laterality: N/A;  . CATARACT EXTRACTION W/PHACO Right 06/11/2012   Procedure: CATARACT EXTRACTION PHACO AND INTRAOCULAR LENS PLACEMENT ([place]);  Surgeon: Tonny Branch, MD;  Location: [facility];  Service: Ophthalmology;  Laterality: Right;  CDE:  14.43  . CATARACT EXTRACTION W/PHACO Left 06/29/2012   Procedure: CATARACT EXTRACTION PHACO AND INTRAOCULAR LENS PLACEMENT ([facility]);  Surgeon: Tonny Branch, MD;  Location: [facility];  Service: Ophthalmology;  Laterality: Left;  CDE:14.11  . CHOLECYSTECTOMY  05/08/2012  . CHOLECYSTECTOMY N/A 05/08/2012   Procedure: LAPAROSCOPIC CHOLECYSTECTOMY;  Surgeon: Harl Bowie, MD;  Location: [facility];  Service: General;  Laterality: N/A;  . COLONOSCOPY    . LIVER BIOPSY N/A 05/08/2012   Procedure: LIVER BIOPSY;  Surgeon: Harl Bowie, MD;  Location: [facility];  Service: General;  Laterality: N/A;  . NECK SURGERY     cervical disc with cliip  . SHOULDER ARTHROSCOPY WITH DISTAL CLAVICLE RESECTION Right 12/12/2016   Procedure: RIGHT SHOULDER ARTHROSCOPY, SUBACROMIAL DECOMPRESSION, DISTAL CLAVICLE RESECTION, WITH POSSIBLE MINI OPEN ROTATOR CUFF REPAIR;  Surgeon: [name], MD;  Location: [facility];  Service: Orthopedics;  Laterality: Right;        Home Medications    Prior to Admission medications   Medication Sig Start Date End Date Taking? Authorizing Provider  brimonidine (ALPHAGAN) 0.2 % ophthalmic solution Place 1 drop into both eyes 2 (two) times daily. 07/30/18 07/30/19 Yes [provider]  latanoprost (XALATAN) 0.005 % ophthalmic  solution Place 1 drop into the left eye every evening.  07/30/18 07/30/19 Yes [provider]  albuterol (PROVENTIL HFA;VENTOLIN HFA) 108 (90 Base) MCG/ACT inhaler Inhale 2 puffs into the lungs every 6 (six) hours as needed for wheezing. 04/08/16   Kathyrn Drown, MD  ALPRAZolam Duanne Moron) 0.5 MG tablet Take 0.5-1 tablets (0.25-0.5 mg total) by mouth 2 (two) times daily as needed for anxiety or sleep. 07/22/18   Kathyrn Drown, MD  apixaban (ELIQUIS) 5 MG TABS tablet Take 1 tablet (5 mg total) by mouth 2 (two) times daily. 07/27/18   Sherran Needs, NP  calcium carbonate (TUMS - DOSED IN MG ELEMENTAL CALCIUM) 500 MG chewable tablet Chew 1 tablet by mouth daily as needed for indigestion or heartburn.    [provider]  diltiazem (CARTIA XT) 120 MG 24 hr capsule TAKE 1 CAPSULE(120 MG) BY MOUTH DAILY 07/22/18   Sherran Needs, NP  fexofenadine (ALLEGRA) 180 MG tablet Take 180 mg by mouth daily as needed for allergies or rhinitis.    [provider]  fluticasone (FLONASE) 50 MCG/ACT nasal spray Place 2 sprays into both nostrils daily. Patient taking differently: Place 1 spray into both nostrils daily.  01/02/16   Kathyrn Drown, MD  HYDROcodone-acetaminophen (NORCO/VICODIN) 5-325 MG tablet Take 1 tablet by mouth 4 (four) times daily as needed for moderate pain. 07/22/18   Kathyrn Drown, MD  HYDROcodone-acetaminophen (NORCO/VICODIN) 5-325 MG tablet Take one tablet by mouth QID 07/22/18   Kathyrn Drown, MD  HYDROcodone-acetaminophen (NORCO/VICODIN) 5-325 MG tablet Take one tablet by mouth QID 07/22/18   Luking, Elayne Snare, MD  OXYGEN Inhale 2.5-2.75 L into the lungs as needed (for shortness of breath).     [provider]    Family History Family History  Problem Relation Age of Onset  . Hypertension Mother   . Congestive Heart Failure Mother   . COPD Mother   . Stroke Father   . Breast cancer Sister     Social History Social History   Tobacco Use  . Smoking  status: Former Smoker    Packs/day: 0.50    Years: 52.00    Pack years: 26.00    Types: Cigarettes    Quit date: 09/08/2003    Years since quitting: 14.9  . Smokeless tobacco: Never Used  Substance Use Topics  . Alcohol use: No    Comment: quit drinking 2004  . Drug use: No     Allergies   Pirfenidone, Acyclovir and related, Oxycodone, Pravastatin, Simvastatin, Prednisone, and Tegretol [carbamazepine]   Review of Systems Review of Systems  Constitutional: Negative for diaphoresis and fever.  HENT: Negative.   Eyes: Negative.   Respiratory: Positive for shortness of breath. Negative for chest tightness.   Cardiovascular: Positive for chest pain. Negative for palpitations and leg swelling.  Gastrointestinal: Negative for abdominal pain, nausea and vomiting.  Genitourinary: Negative.   Musculoskeletal: Negative for arthralgias, joint swelling and neck pain.  Skin: Negative.  Negative for rash and wound.  Neurological: Negative for dizziness, weakness, light-headedness, numbness and headaches.  Psychiatric/Behavioral: Negative.      Physical Exam Updated Vital Signs BP (!) 116/104 (BP Location: Right Arm)   Pulse 79   Temp 98.5 F (36.9 C) (Oral)   Resp 16   Ht 6\' 1"  (1.854 m)   Wt 108.9 kg   SpO2 98%   BMI 31.66 kg/m   Physical Exam Vitals signs and nursing note reviewed.  Constitutional:      Appearance: He is well-developed.  HENT:     Head: Normocephalic and atraumatic.  Eyes:     Conjunctiva/sclera: Conjunctivae normal.  Neck:     Musculoskeletal: Normal range of motion.  Cardiovascular:     Rate and Rhythm: Normal rate and regular rhythm.     Heart sounds: Normal heart sounds.  Pulmonary:     Effort: Pulmonary effort is normal. No tachypnea, accessory muscle usage or respiratory distress.     Breath sounds: Normal breath sounds. No decreased breath sounds, wheezing or rhonchi.  Chest:     Chest wall: No tenderness, crepitus or edema.  Abdominal:      General: Bowel sounds are normal.     Palpations: Abdomen is soft.     Tenderness: There is no abdominal tenderness.  Musculoskeletal: Normal range of motion.     Right lower leg: No edema.     Left lower leg: No edema.  Skin:    General: Skin is warm and dry.  Neurological:     Mental Status: He is alert.      ED Treatments / Results  Labs (all labs ordered are listed, but only abnormal results are displayed) Labs Reviewed  BASIC METABOLIC PANEL - Abnormal; Notable for the following components:      Result Value   Calcium 8.7 (*)    All other components within normal limits  CBC - Abnormal; Notable for the following components:   WBC 11.3 (*)    Platelets 124 (*)    All other components within normal limits  D-DIMER, QUANTITATIVE (NOT AT [facility])  CBG MONITORING, ED  TROPONIN I (HIGH SENSITIVITY)  TROPONIN I (HIGH SENSITIVITY)    EKG EKG Interpretation  Date/Time:  Thursday August 13 2018 17:20:08 EDT Ventricular Rate:  84 PR Interval:    QRS Duration: 92 QT Interval:  361 QTC Calculation: 427 R Axis:   31 Text Interpretation:  Sinus rhythm Confirmed by Milton Ferguson [id number]) on 08/13/2018 7:02:06 PM   Radiology Dg Chest Portable 1 View  Result Date: 08/13/2018 CLINICAL DATA:  Chest pain EXAM: PORTABLE CHEST 1 VIEW COMPARISON:  05/26/2016, 04/21/2016 FINDINGS: Low lung volumes. Bilateral pulmonary fibrosis, grossly unchanged allowing for portable technique. Mild cardiomegaly. No pneumothorax. IMPRESSION: Low lung volumes with bilateral fibrosis. No definite acute superimposed opacity. Mild cardiomegaly Electronically Signed   By: Donavan Foil M.D.   On: 08/13/2018 18:21    Procedures Procedures (including critical care time)  Medications Ordered in ED Medications  morphine 4 MG/ML injection 4 mg (4 mg Intravenous Given 08/13/18 1749)  ondansetron (ZOFRAN) injection 4 mg (4 mg Intravenous Given 08/13/18 1749)     Initial Impression / Assessment and Plan / ED Course   I have reviewed the triage vital signs and the nursing notes.  Pertinent labs & imaging results that were available during my care of the patient were reviewed by me and considered in my medical decision making (see chart for details).     HEAR Score: 4  Vital signs reviewed. Pulse documented as 39 bpm at 17:30, recheck of pt on monitor, rate 78 and regular, no pattern of afib.   Pt with heart score of 4, initial troponin 7 ng/L.  Pain is pleuritic with radiation into back. Atypical for ACS. Pt is on xaralto with a negative d dimer test, doubt PE.  He underwent a cardiac CT scan on 7/6 prior to his ablation procedure with no evidence of aortic aneurysm or dissection.    Pt reports burping several times while here and was having improvement in sx.  He is convinced sx are due to acid reflux.  He refused second troponin and also refused admission which was our recommendation given cardiac risk factors Heart score.  He states will call his cardiologist in the morning.  Strict return precautions discussed.   Final Clinical Impressions(s) / ED Diagnoses   Final diagnoses:  Chest pain, unspecified type    ED Discharge Orders    None       Landis Martins 08/13/18 2025    Milton Ferguson, MD 08/14/18 [phone]

## 2018-08-13 NOTE — ED Triage Notes (Signed)
Chest pain on the LT side on and off since 07/10

## 2018-08-13 NOTE — ED Notes (Signed)
Patient rude to staff stating that nothing has been done for him and that he is not going to run in circles with staff. Educated pt on importance of staying for second troponin and other treatments. Also educated pt on risks involved with leaving ED with his extensive cardiac history. Patient refusing to be treated. Took IV out for patient and wrapped with curlex. Bleeding controlled. Patient leaving with family.

## 2018-08-16 ENCOUNTER — Other Ambulatory Visit: Payer: Self-pay

## 2018-08-16 ENCOUNTER — Emergency Department (HOSPITAL_COMMUNITY): Payer: PPO

## 2018-08-16 ENCOUNTER — Observation Stay (HOSPITAL_COMMUNITY)
Admission: EM | Admit: 2018-08-16 | Discharge: 2018-08-17 | Disposition: A | Discharge status: Home / Self Care | Payer: PPO | Attending: Internal Medicine | Admitting: Internal Medicine

## 2018-08-16 ENCOUNTER — Encounter (HOSPITAL_COMMUNITY): Payer: Self-pay | Admitting: *Deleted

## 2018-08-16 DIAGNOSIS — R0602 Shortness of breath: Secondary | ICD-10-CM | POA: Diagnosis not present

## 2018-08-16 DIAGNOSIS — Z87891 Personal history of nicotine dependence: Secondary | ICD-10-CM | POA: Diagnosis not present

## 2018-08-16 DIAGNOSIS — Z20828 Contact with and (suspected) exposure to other viral communicable diseases: Secondary | ICD-10-CM | POA: Diagnosis not present

## 2018-08-16 DIAGNOSIS — J45909 Unspecified asthma, uncomplicated: Secondary | ICD-10-CM | POA: Insufficient documentation

## 2018-08-16 DIAGNOSIS — J8410 Pulmonary fibrosis, unspecified: Secondary | ICD-10-CM | POA: Diagnosis not present

## 2018-08-16 DIAGNOSIS — H409 Unspecified glaucoma: Secondary | ICD-10-CM | POA: Diagnosis not present

## 2018-08-16 DIAGNOSIS — R0609 Other forms of dyspnea: Secondary | ICD-10-CM

## 2018-08-16 DIAGNOSIS — R079 Chest pain, unspecified: Secondary | ICD-10-CM | POA: Diagnosis not present

## 2018-08-16 DIAGNOSIS — Z8709 Personal history of other diseases of the respiratory system: Secondary | ICD-10-CM

## 2018-08-16 DIAGNOSIS — R0789 Other chest pain: Secondary | ICD-10-CM | POA: Diagnosis not present

## 2018-08-16 DIAGNOSIS — E785 Hyperlipidemia, unspecified: Secondary | ICD-10-CM | POA: Diagnosis not present

## 2018-08-16 DIAGNOSIS — I309 Acute pericarditis, unspecified: Secondary | ICD-10-CM

## 2018-08-16 DIAGNOSIS — I4819 Other persistent atrial fibrillation: Secondary | ICD-10-CM | POA: Diagnosis not present

## 2018-08-16 DIAGNOSIS — H401490 Capsular glaucoma with pseudoexfoliation of lens, unspecified eye, stage unspecified: Secondary | ICD-10-CM | POA: Diagnosis not present

## 2018-08-16 DIAGNOSIS — Z79899 Other long term (current) drug therapy: Secondary | ICD-10-CM | POA: Insufficient documentation

## 2018-08-16 DIAGNOSIS — Z7901 Long term (current) use of anticoagulants: Secondary | ICD-10-CM | POA: Insufficient documentation

## 2018-08-16 DIAGNOSIS — D696 Thrombocytopenia, unspecified: Secondary | ICD-10-CM | POA: Diagnosis present

## 2018-08-16 DIAGNOSIS — J849 Interstitial pulmonary disease, unspecified: Secondary | ICD-10-CM | POA: Diagnosis present

## 2018-08-16 DIAGNOSIS — I10 Essential (primary) hypertension: Secondary | ICD-10-CM | POA: Diagnosis not present

## 2018-08-16 DIAGNOSIS — I4891 Unspecified atrial fibrillation: Secondary | ICD-10-CM | POA: Diagnosis not present

## 2018-08-16 LAB — BASIC METABOLIC PANEL
Anion gap: 11 (ref 5–15)
BUN: 12 mg/dL (ref 8–23)
CO2: 25 mmol/L (ref 22–32)
Calcium: 9 mg/dL (ref 8.9–10.3)
Chloride: 101 mmol/L (ref 98–111)
Creatinine, Ser: 1.02 mg/dL (ref 0.61–1.24)
GFR calc Af Amer: >60 mL/min (ref >60)
GFR calc non Af Amer: >60 mL/min (ref >60)
Glucose, Bld: 107 mg/dL — ABNORMAL HIGH (ref 70–99)
Potassium: 4.5 mmol/L (ref 3.5–5.1)
Sodium: 137 mmol/L (ref 135–145)

## 2018-08-16 LAB — CBC
HCT: 42.8 % (ref 39.0–52.0)
Hemoglobin: 14.3 g/dL (ref 13.0–17.0)
MCH: 31.2 pg (ref 26.0–34.0)
MCHC: 33.4 g/dL (ref 30.0–36.0)
MCV: 93.4 fL (ref 80.0–100.0)
Platelets: 122 10*3/uL — ABNORMAL LOW (ref 150–400)
RBC: 4.58 MIL/uL (ref 4.22–5.81)
RDW: 12.2 % (ref 11.5–15.5)
WBC: 9.1 10*3/uL (ref 4.0–10.5)
nRBC: 0 % (ref 0.0–0.2)

## 2018-08-16 LAB — SARS CORONAVIRUS 2 (TAT 6-24 HRS): SARS Coronavirus 2: NEGATIVE

## 2018-08-16 LAB — LIPID PANEL
Cholesterol: 119 mg/dL (ref 0–200)
HDL: 23 mg/dL — ABNORMAL LOW (ref >40)
LDL Cholesterol: 79 mg/dL (ref 0–99)
Total CHOL/HDL Ratio: 5.2 RATIO
Triglycerides: 84 mg/dL (ref <150)
VLDL: 17 mg/dL (ref 0–40)

## 2018-08-16 LAB — TROPONIN I (HIGH SENSITIVITY)
Troponin I (High Sensitivity): 7 ng/L (ref <18)
Troponin I (High Sensitivity): 8 ng/L (ref <18)

## 2018-08-16 MED ORDER — ALPRAZOLAM 0.25 MG PO TABS: 0.2500 mg | ORAL_TABLET | Freq: Two times a day (BID) | ORAL | Status: DC | PRN

## 2018-08-16 MED ORDER — FLUTICASONE PROPIONATE 50 MCG/ACT NA SUSP
1.0000 | Freq: Every day | NASAL | Status: DC
  Filled 2018-08-16: qty 16

## 2018-08-16 MED ORDER — ALBUTEROL SULFATE HFA 108 (90 BASE) MCG/ACT IN AERS
2.0000 | INHALATION_SPRAY | Freq: Four times a day (QID) | RESPIRATORY_TRACT | Status: DC | PRN
  Filled 2018-08-16: qty 6.7

## 2018-08-16 MED ORDER — BRIMONIDINE TARTRATE 0.2 % OP SOLN
1.0000 [drp] | Freq: Two times a day (BID) | OPHTHALMIC | Status: DC
  Administered 2018-08-16 – 2018-08-17 (×2): 1 [drp] via OPHTHALMIC
  Filled 2018-08-16: qty 5

## 2018-08-16 MED ORDER — HYDROCODONE-ACETAMINOPHEN 5-325 MG PO TABS
1.0000 | ORAL_TABLET | Freq: Four times a day (QID) | ORAL | Status: DC | PRN
  Administered 2018-08-16 – 2018-08-17 (×6): 1 via ORAL
  Filled 2018-08-16 (×6): qty 1

## 2018-08-16 MED ORDER — CALCIUM CARBONATE ANTACID 500 MG PO CHEW: 1.0000 | CHEWABLE_TABLET | Freq: Every day | ORAL | Status: DC | PRN

## 2018-08-16 MED ORDER — SODIUM CHLORIDE 0.9% FLUSH
3.0000 mL | Freq: Once | INTRAVENOUS | Status: AC
  Administered 2018-08-16: 3 mL via INTRAVENOUS

## 2018-08-16 MED ORDER — ACETAMINOPHEN 325 MG PO TABS: 650.0000 mg | ORAL_TABLET | ORAL | Status: DC | PRN

## 2018-08-16 MED ORDER — IBUPROFEN 400 MG PO TABS
400.0000 mg | ORAL_TABLET | Freq: Once | ORAL | Status: AC | PRN
  Administered 2018-08-16: 400 mg via ORAL
  Filled 2018-08-16: qty 1

## 2018-08-16 MED ORDER — DILTIAZEM HCL ER COATED BEADS 120 MG PO CP24
120.0000 mg | ORAL_CAPSULE | Freq: Every day | ORAL | Status: DC
  Administered 2018-08-16 – 2018-08-17 (×2): 120 mg via ORAL
  Filled 2018-08-16 (×2): qty 1

## 2018-08-16 MED ORDER — LATANOPROST 0.005 % OP SOLN
1.0000 [drp] | Freq: Every day | OPHTHALMIC | Status: DC
  Administered 2018-08-16: 1 [drp] via OPHTHALMIC
  Filled 2018-08-16: qty 2.5

## 2018-08-16 MED ORDER — APIXABAN 5 MG PO TABS
5.0000 mg | ORAL_TABLET | Freq: Two times a day (BID) | ORAL | Status: DC
  Administered 2018-08-16 – 2018-08-17 (×3): 5 mg via ORAL
  Filled 2018-08-16 (×4): qty 1

## 2018-08-16 MED ORDER — ONDANSETRON HCL 4 MG/2ML IJ SOLN: 4.0000 mg | Freq: Four times a day (QID) | INTRAMUSCULAR | Status: DC | PRN

## 2018-08-16 MED ORDER — COLCHICINE 0.6 MG PO TABS
0.6000 mg | ORAL_TABLET | Freq: Two times a day (BID) | ORAL | Status: DC
  Administered 2018-08-16 – 2018-08-17 (×3): 0.6 mg via ORAL
  Filled 2018-08-16 (×3): qty 1

## 2018-08-16 NOTE — ED Notes (Signed)
Pt now c/o chest pain 3-4/10. C/o some shortness of breath.

## 2018-08-16 NOTE — ED Notes (Signed)
ED TO INPATIENT HANDOFF REPORT  ED Nurse Name and Phone #: Caryl Pina [phone]  S Name/Age/Gender Curtis [name] 73 y.o. male Room/Bed: 037C/037C  Code Status   Code Status: Full Code  Home/SNF/Other Home Patient oriented to: situation Is this baseline? Yes   Triage Complete: Triage complete  Chief Complaint Chest Pain; SOB  Triage Note Pt reports having cardiac ablation 7/10, onset Thursday of severe mid chest pain. Pt went to [facility] but still having the severe pain and sob. Hx of pulmonary fibrosis.    Allergies Allergies  Allergen Reactions  . Pirfenidone Other (See Comments)    Elevated bilirubin  . Acyclovir And Related Other (See Comments)    Unknown  . Oxycodone Itching  . Pravastatin Other (See Comments)    Chills    . Simvastatin Other (See Comments)    Chills and Leg pain  . Prednisone Anxiety and Other (See Comments)    Irritation, Can tolerate in small doses   . Tegretol [Carbamazepine] Other (See Comments)    dizziness    Level of Care/Admitting Diagnosis ED Disposition    ED Disposition Condition [facility]: [place] [[id number]]  Level of Care: Telemetry Cardiac [103]  I expect the patient will be discharged within 24 hours: No (not a candidate for 5C-Observation unit)  Covid Evaluation: Asymptomatic Screening Protocol (No Symptoms)  Diagnosis: Chest pain [854627]  Admitting Physician: Norval Morton [[id number]]  Attending Physician: Norval Morton [[id number]]  PT Class (Do Not Modify): Observation [104]  PT Acc Code (Do Not Modify): Observation [10022]       B Medical/Surgery History Past Medical History:  Diagnosis Date  . Arthritis   . Asthma   . Atrial fibrillation ([place])   . Cataract   . Cervical spondylosis   . Chest pain    Emergency room October 24, 2011, no MI  //   Nuclear, October, 2013, adenosine, EF 61%, no scar or ischemia  . Dyslipidemia    Statin intolerant  . Dyspnea   . GERD  (gastroesophageal reflux disease)   . Glaucoma   . Hearing loss   . Hypertension   . Hypertension   . Idiopathic pulmonary fibrosis ([place])   . Interstitial pulmonary fibrosis ([place]) 2012  . Left shoulder pain 2011  . Myocardial infarction ([place])   . Psoriasis   . Pulmonary fibrosis ([place])   . Statin intolerance    Past Surgical History:  Procedure Laterality Date  . ATRIAL FIBRILLATION ABLATION N/A 02/18/2018   Procedure: ATRIAL FIBRILLATION ABLATION;  Surgeon: Constance Haw, MD;  Location: [facility];  Service: Cardiovascular;  Laterality: N/A;  . ATRIAL FIBRILLATION ABLATION N/A 07/17/2018   Procedure: ATRIAL FIBRILLATION ABLATION;  Surgeon: Constance Haw, MD;  Location: [facility];  Service: Cardiovascular;  Laterality: N/A;  . BRAIN SURGERY    . CARDIOVERSION N/A 12/13/2015   Procedure: CARDIOVERSION;  Surgeon: Josue Hector, MD;  Location: [facility];  Service: Cardiovascular;  Laterality: N/A;  . CATARACT EXTRACTION W/PHACO Right 06/11/2012   Procedure: CATARACT EXTRACTION PHACO AND INTRAOCULAR LENS PLACEMENT ([place]);  Surgeon: Tonny Branch, MD;  Location: [facility];  Service: Ophthalmology;  Laterality: Right;  CDE:  14.43  . CATARACT EXTRACTION W/PHACO Left 06/29/2012   Procedure: CATARACT EXTRACTION PHACO AND INTRAOCULAR LENS PLACEMENT (I[facility]);  Surgeon: Tonny Branch, MD;  Location: [facility];  Service: Ophthalmology;  Laterality: Left;  CDE:14.11  . CHOLECYSTECTOMY  05/08/2012  . CHOLECYSTECTOMY N/A  05/08/2012   Procedure: LAPAROSCOPIC CHOLECYSTECTOMY;  Surgeon: Harl Bowie, MD;  Location: [facility];  Service: General;  Laterality: N/A;  . COLONOSCOPY    . LIVER BIOPSY N/A 05/08/2012   Procedure: LIVER BIOPSY;  Surgeon: Harl Bowie, MD;  Location: [facility];  Service: General;  Laterality: N/A;  . NECK SURGERY     cervical disc with cliip  . SHOULDER ARTHROSCOPY WITH DISTAL CLAVICLE RESECTION Right 12/12/2016   Procedure: RIGHT SHOULDER ARTHROSCOPY, SUBACROMIAL  DECOMPRESSION, DISTAL CLAVICLE RESECTION, WITH POSSIBLE MINI OPEN ROTATOR CUFF REPAIR;  Surgeon: [name], MD;  Location: [facility];  Service: Orthopedics;  Laterality: Right;     A IV Location/Drains/Wounds Patient Lines/Drains/Airways Status   Active Line/Drains/Airways    Name:   Placement date:   Placement time:   Site:   Days:   Peripheral IV 08/13/18 Left Antecubital   08/13/18    1725    Antecubital   3   Peripheral IV 08/16/18 Forearm   08/16/18    0810    Forearm   less than 1   Incision 05/08/12 Abdomen Other (Comment)   05/08/12    1000     2291   Incision 06/11/12 Eye Right   06/11/12    1059     2257   Incision 06/29/12 Eye Left   06/29/12    1150     2239   Incision (Closed) 12/12/16 Shoulder Right   12/12/16    0858     612   Incision - 4 Ports Abdomen 1: Umbilicus 2: Mid;Upper 3: Right;Medial 4: Right;Lateral   05/08/12    0935     2291          Intake/Output Last 24 hours No intake or output data in the 24 hours ending 08/16/18 1137  Labs/Imaging Results for orders placed or performed during the hospital encounter of 08/16/18 (from the past 48 hour(s))  Basic metabolic panel     Status: Abnormal   Collection Time: 08/16/18  2:00 AM  Result Value Ref Range   Sodium 137 135 - 145 mmol/L   Potassium 4.5 3.5 - 5.1 mmol/L   Chloride 101 98 - 111 mmol/L   CO2 25 22 - 32 mmol/L   Glucose, Bld 107 (H) 70 - 99 mg/dL   BUN 12 8 - 23 mg/dL   Creatinine, Ser 1.02 0.61 - 1.24 mg/dL   Calcium 9.0 8.9 - 10.3 mg/dL   GFR calc non Af Amer >60 >60 mL/min   GFR calc Af Amer >60 >60 mL/min   Anion gap 11 5 - 15    Comment: Performed at [facility], [place] [address] [place], [place] [address]  CBC     Status: Abnormal   Collection Time: 08/16/18  2:00 AM  Result Value Ref Range   WBC 9.1 4.0 - 10.5 K/uL   RBC 4.58 4.22 - 5.81 MIL/uL   Hemoglobin 14.3 13.0 - 17.0 g/dL   HCT 42.8 39.0 - 52.0 %   MCV 93.4 80.0 - 100.0 fL   MCH 31.2 26.0 - 34.0 pg   MCHC 33.4 30.0  - 36.0 g/dL   RDW 12.2 11.5 - 15.5 %   Platelets 122 (L) 150 - 400 K/uL   nRBC 0.0 0.0 - 0.2 %    Comment: Performed at [facility], [place] [address], [place], [place] [address]  Troponin I (High Sensitivity)     Status: None   Collection Time:  08/16/18  2:00 AM  Result Value Ref Range   Troponin I (High Sensitivity) 7 <18 ng/L    Comment: (NOTE) Elevated high sensitivity troponin I (hsTnI) values and significant  changes across serial measurements may suggest ACS but many other  chronic and acute conditions are known to elevate hsTnI results.  Refer to the "Links" section for chest pain algorithms and additional  guidance. Performed at [facility], [place] [address], [place], [place] [address]   Troponin I (High Sensitivity)     Status: None   Collection Time: 08/16/18  4:14 AM  Result Value Ref Range   Troponin I (High Sensitivity) 8 <18 ng/L    Comment: (NOTE) Elevated high sensitivity troponin I (hsTnI) values and significant  changes across serial measurements may suggest ACS but many other  chronic and acute conditions are known to elevate hsTnI results.  Refer to the "Links" section for chest pain algorithms and additional  guidance. Performed at [facility], [place] [address], [place], [place] [address]   Lipid panel     Status: Abnormal   Collection Time: 08/16/18  4:14 AM  Result Value Ref Range   Cholesterol 119 0 - 200 mg/dL   Triglycerides 84 <150 mg/dL   HDL 23 (L) >40 mg/dL   Total CHOL/HDL Ratio 5.2 RATIO   VLDL 17 0 - 40 mg/dL   LDL Cholesterol 79 0 - 99 mg/dL    Comment:        Total Cholesterol/HDL:CHD Risk Coronary Heart Disease Risk Table                     Men   Women  1/2 Average Risk   3.4   3.3  Average Risk       5.0   4.4  2 X Average Risk   9.6   7.1  3 X Average Risk  23.4   11.0        Use the calculated Patient Ratio above and the CHD Risk Table to determine the patient's CHD Risk.        ATP III CLASSIFICATION (LDL):   <100     mg/dL   Optimal  100-129  mg/dL   Near or Above                    Optimal  130-159  mg/dL   Borderline  160-189  mg/dL   High  >190     mg/dL   Very High Performed at [facility] [address], [place], [place] [address]    Dg Chest 2 View  Result Date: 08/16/2018 CLINICAL DATA:  73 year old male with chest pain and shortness of breath. EXAM: CHEST - 2 VIEW COMPARISON:  Chest radiograph dated 08/13/2018 FINDINGS: Diffuse bilateral interstitial and subpleural densities similar to prior radiograph likely chronic and fibrosis. No new consolidative changes. There is no pleural effusion or pneumothorax. Stable cardiac silhouette. No acute osseous pathology. IMPRESSION: No active cardiopulmonary disease.  No interval change. Electronically Signed   By: Anner Crete M.D.   On: 08/16/2018 02:21    Pending Labs Unresulted Labs (From admission, onward)    Start     Ordered   08/16/18 0651  SARS CORONAVIRUS 2 Nasal Swab Aptima Multi Swab  (Asymptomatic/Tier 2 Patients Labs)  Once,   STAT    Question Answer Comment  Is this test for diagnosis or screening Screening   Symptomatic for COVID-19 as defined  by CDC No   Hospitalized for COVID-19 No   Admitted to ICU for COVID-19 No   Previously tested for COVID-19 Yes   Resident in a congregate (group) care setting No   Employed in healthcare setting No      08/16/18 0650          Vitals/Pain Today's Vitals   08/16/18 1000 08/16/18 1015 08/16/18 1115 08/16/18 1130  BP: (!) 84/64 90/64 116/68 119/77  Pulse: 77 69 63 69  Resp: 17 17 19 18   Temp:      TempSrc:      SpO2: 98% 97% 94% 97%  PainSc:        Isolation Precautions No active isolations  Medications Medications  HYDROcodone-acetaminophen (NORCO/VICODIN) 5-325 MG per tablet 1 tablet (has no administration in time range)  diltiazem (CARDIZEM CD) 24 hr capsule 120 mg (has no administration in time range)  ALPRAZolam (XANAX) tablet 0.25-0.5 mg (has no  administration in time range)  calcium carbonate (TUMS - dosed in mg elemental calcium) chewable tablet 200 mg of elemental calcium (has no administration in time range)  apixaban (ELIQUIS) tablet 5 mg (5 mg Oral Given 08/16/18 1019)  albuterol (VENTOLIN HFA) 108 (90 Base) MCG/ACT inhaler 2 puff (has no administration in time range)  brimonidine (ALPHAGAN) 0.2 % ophthalmic solution 1 drop (1 drop Both Eyes Not Given 08/16/18 1019)  fluticasone (FLONASE) 50 MCG/ACT nasal spray 1 spray (has no administration in time range)  latanoprost (XALATAN) 0.005 % ophthalmic solution 1 drop (has no administration in time range)  acetaminophen (TYLENOL) tablet 650 mg (has no administration in time range)  ondansetron (ZOFRAN) injection 4 mg (has no administration in time range)  colchicine tablet 0.6 mg (0.6 mg Oral Given 08/16/18 1017)  sodium chloride flush (NS) 0.9 % injection 3 mL (3 mLs Intravenous Given 08/16/18 1018)  ibuprofen (ADVIL) tablet 400 mg (400 mg Oral Given 08/16/18 0329)    Mobility walks Low fall risk   Focused Assessments Cardiac Assessment Handoff:  Cardiac Rhythm: Normal sinus rhythm Lab Results  Component Value Date   TROPONINI <0.03 05/26/2016   Lab Results  Component Value Date   DDIMER 0.37 08/13/2018   Does the Patient currently have chest pain? Yes     R Recommendations: See Admitting Provider Note  Report given to:   Additional Notes: wife at bedside

## 2018-08-16 NOTE — ED Provider Notes (Signed)
[facility] EMERGENCY DEPARTMENT Provider Note   CSN: [id number] Arrival date & time: 08/16/18  0138    History   Chief Complaint Chief Complaint  Patient presents with  . Chest Pain    HPI Curtis [name] is a 73 y.o. male.     73 y.o. male with a history of atrial fibrillation s/p ablation 07/17/18, asthma, MI, HTN, HLD, GERD, HTN, and pulmonary fibrosis presents to the emergency department for complaints of chest pain.  He describes a sharp, shooting central chest pain which is aggravated with deep breathing.  His pain is improved since receiving ibuprofen in triage.  Has been feeling increasingly short of breath with worsening dyspnea on exertion.  This has really been noticeable since his ablation 1 month ago, but subjectively worse over the past 3 days.  Does use approximately 2.5 L of oxygen via nasal cannula as needed for symptomatic hypoxemia.  States that he has required use of his supplemental oxygen much more frequently, including at night which he states he has never needed in the past.  He has not had any fevers, loss of consciousness, leg swelling, sick contacts, or COVID exposure.  Was seen for similar complaints on Thursday at [facility]; was recommended admission, but chose to be discharged home.  The history is provided by the patient. No language interpreter was used.  Chest Pain   Past Medical History:  Diagnosis Date  . Arthritis   . Asthma   . Atrial fibrillation ([place])   . Cataract   . Cervical spondylosis   . Chest pain    Emergency room October 24, 2011, no MI  //   Nuclear, October, 2013, adenosine, EF 61%, no scar or ischemia  . Dyslipidemia    Statin intolerant  . Dyspnea   . GERD (gastroesophageal reflux disease)   . Glaucoma   . Hearing loss   . Hypertension   . Hypertension   . Idiopathic pulmonary fibrosis ([place])   . Interstitial pulmonary fibrosis ([place]) 2012  . Left shoulder pain 2011  . Myocardial infarction ([place])    . Psoriasis   . Pulmonary fibrosis ([place])   . Statin intolerance     Patient Active Problem List   Diagnosis Date Noted  . Impingement syndrome of right shoulder 12/12/2016  . AC (acromioclavicular) arthritis 12/12/2016  . Partial tear of right rotator cuff 12/12/2016  . Chronic respiratory failure ([place]) 04/26/2016  . Hyperbilirubinemia 04/26/2016  . H/O asbestos exposure 03/15/2016  . Persistent atrial fibrillation 02/19/2016  . Atrial fibrillation, persistent 02/19/2016  . IPF (idiopathic pulmonary fibrosis) ([place]) 02/09/2016  . Incidental lung nodule, greater than or equal to 71mm 02/09/2016  . Bell's palsy 08/03/2015  . Insomnia 06/08/2015  . Hypertension 08/18/2014  . Dyslipidemia 08/18/2014  . Gastroesophageal reflux disease 08/18/2014  . Trigeminal neuralgia of right side of face 07/05/2014  . Right trigeminal neuralgia 04/01/2014  . Chronic back pain 03/01/2014  . Elevated transaminase level 06/25/2013  . Elevation of level of transaminase or lactic acid dehydrogenase (LDH) 06/25/2013  . Interstitial pulmonary disease, unspecified ([place]) 01/22/2013  . Obesity (BMI 30-39.9) 09/09/2012  . Chronic neck pain 09/09/2012  . Body mass index (BMI) greater than 30 in adult 09/09/2012  . Symptomatic cholelithiasis 04/14/2012  . Chest pain   . Statin intolerance   . INTERSTITIAL LUNG DISEASE 03/16/2010  . Psoriasis 03/16/2010    Past Surgical History:  Procedure Laterality Date  . ATRIAL FIBRILLATION ABLATION N/A 02/18/2018   Procedure: ATRIAL  FIBRILLATION ABLATION;  Surgeon: Constance Haw, MD;  Location: [facility];  Service: Cardiovascular;  Laterality: N/A;  . ATRIAL FIBRILLATION ABLATION N/A 07/17/2018   Procedure: ATRIAL FIBRILLATION ABLATION;  Surgeon: Constance Haw, MD;  Location: [facility];  Service: Cardiovascular;  Laterality: N/A;  . BRAIN SURGERY    . CARDIOVERSION N/A 12/13/2015   Procedure: CARDIOVERSION;  Surgeon: Josue Hector, MD;   Location: [facility];  Service: Cardiovascular;  Laterality: N/A;  . CATARACT EXTRACTION W/PHACO Right 06/11/2012   Procedure: CATARACT EXTRACTION PHACO AND INTRAOCULAR LENS PLACEMENT ([place]);  Surgeon: Tonny Branch, MD;  Location[facility];  Service: Ophthalmology;  Laterality: Right;  CDE:  14.43  . CATARACT EXTRACTION W/PHACO Left 06/29/2012   Procedure: CATARACT EXTRACTION PHACO AND INTRAOCULAR LENS PLACEMENT ([facility]);  Surgeon: Tonny Branch, MD;  [facility];  Service: Ophthalmology;  Laterality: Left;  CDE:14.11  . CHOLECYSTECTOMY  05/08/2012  . CHOLECYSTECTOMY N/A 05/08/2012   Procedure: LAPAROSCOPIC CHOLECYSTECTOMY;  Surgeon: Harl Bowie, MD;  Location: [facility];  Service: General;  Laterality: N/A;  . COLONOSCOPY    . LIVER BIOPSY N/A 05/08/2012   Procedure: LIVER BIOPSY;  Surgeon: Harl Bowie, MD;  Location: [facility];  Service: General;  Laterality: N/A;  . NECK SURGERY     cervical disc with cliip  . SHOULDER ARTHROSCOPY WITH DISTAL CLAVICLE RESECTION Right 12/12/2016   Procedure: RIGHT SHOULDER ARTHROSCOPY, SUBACROMIAL DECOMPRESSION, DISTAL CLAVICLE RESECTION, WITH POSSIBLE MINI OPEN ROTATOR CUFF REPAIR;  Surgeon: [name], MD;  Location: [facility];  Service: Orthopedics;  Laterality: Right;        Home Medications    Prior to Admission medications   Medication Sig Start Date End Date Taking? Authorizing Provider  albuterol (PROVENTIL HFA;VENTOLIN HFA) 108 (90 Base) MCG/ACT inhaler Inhale 2 puffs into the lungs every 6 (six) hours as needed for wheezing. 04/08/16  Yes Kathyrn Drown, MD  ALPRAZolam Duanne Moron) 0.5 MG tablet Take 0.5-1 tablets (0.25-0.5 mg total) by mouth 2 (two) times daily as needed for anxiety or sleep. 07/22/18  Yes Kathyrn Drown, MD  apixaban (ELIQUIS) 5 MG TABS tablet Take 1 tablet (5 mg total) by mouth 2 (two) times daily. 07/27/18  Yes Sherran Needs, NP  brimonidine (ALPHAGAN) 0.2 % ophthalmic solution Place 1 drop into both eyes 2 (two) times daily.  07/30/18 07/30/19 Yes [provider]  calcium carbonate (TUMS - DOSED IN MG ELEMENTAL CALCIUM) 500 MG chewable tablet Chew 1 tablet by mouth daily as needed for indigestion or heartburn.   Yes [provider]  diltiazem (CARTIA XT) 120 MG 24 hr capsule TAKE 1 CAPSULE(120 MG) BY MOUTH DAILY 07/22/18  Yes Sherran Needs, NP  fexofenadine (ALLEGRA) 180 MG tablet Take 180 mg by mouth daily as needed for allergies or rhinitis.   Yes [provider]  fluticasone (FLONASE) 50 MCG/ACT nasal spray Place 2 sprays into both nostrils daily. Patient taking differently: Place 1 spray into both nostrils daily.  01/02/16  Yes Kathyrn Drown, MD  HYDROcodone-acetaminophen (NORCO/VICODIN) 5-325 MG tablet Take 1 tablet by mouth 4 (four) times daily as needed for moderate pain. 07/22/18  Yes Kathyrn Drown, MD  latanoprost (XALATAN) 0.005 % ophthalmic solution Place 1 drop into the left eye every evening.  07/30/18 07/30/19 Yes [provider]  HYDROcodone-acetaminophen (NORCO/VICODIN) 5-325 MG tablet Take one tablet by mouth QID 07/22/18   Luking, Elayne Snare, MD  HYDROcodone-acetaminophen (NORCO/VICODIN) 5-325 MG tablet Take  one tablet by mouth QID 07/22/18   Kathyrn Drown, MD  OXYGEN Inhale 2.5-2.75 L into the lungs as needed (for shortness of breath).     [provider]    Family History Family History  Problem Relation Age of Onset  . Hypertension Mother   . Congestive Heart Failure Mother   . COPD Mother   . Stroke Father   . Breast cancer Sister     Social History Social History   Tobacco Use  . Smoking status: Former Smoker    Packs/day: 0.50    Years: 52.00    Pack years: 26.00    Types: Cigarettes    Quit date: 09/08/2003    Years since quitting: 14.9  . Smokeless tobacco: Never Used  Substance Use Topics  . Alcohol use: No    Comment: quit drinking 2004  . Drug use: No     Allergies   Pirfenidone, Acyclovir and related, Oxycodone, Pravastatin,  Simvastatin, Prednisone, and Tegretol [carbamazepine]   Review of Systems Review of Systems  Cardiovascular: Positive for chest pain.  Ten systems reviewed and are negative for acute change, except as noted in the HPI.     Physical Exam Updated Vital Signs BP 127/82   Pulse 74   Temp 99.8 F (37.7 C) (Oral)   Resp 14   SpO2 96%   Physical Exam Vitals signs and nursing note reviewed.  Constitutional:      General: He is not in acute distress.    Appearance: He is well-developed. He is not diaphoretic.     Comments: Nontoxic appearing and in NAD  HENT:     Head: Normocephalic and atraumatic.  Eyes:     General: No scleral icterus.    Conjunctiva/sclera: Conjunctivae normal.  Neck:     Musculoskeletal: Normal range of motion.  Cardiovascular:     Rate and Rhythm: Normal rate and regular rhythm.     Pulses: Normal pulses.  Pulmonary:     Effort: Pulmonary effort is normal. No respiratory distress.     Comments: Very faint rales, expiratory, b/l bases. Otherwise clear lung sounds.  No tachypnea or dyspnea.  SpO2 97% on 3L via [place].  Musculoskeletal: Normal range of motion.  Skin:    General: Skin is warm and dry.     Coloration: Skin is not pale.     Findings: No erythema or rash.  Neurological:     General: No focal deficit present.     Mental Status: He is alert and oriented to person, place, and time.     Coordination: Coordination normal.     Comments: GCS 15. Patient moving all extremities spontaneously.  Psychiatric:        Behavior: Behavior normal.      ED Treatments / Results  Labs (all labs ordered are listed, but only abnormal results are displayed) Labs Reviewed  BASIC METABOLIC PANEL - Abnormal; Notable for the following components:      Result Value   Glucose, Bld 107 (*)    All other components within normal limits  CBC - Abnormal; Notable for the following components:   Platelets 122 (*)    All other components within normal limits  SARS  CORONAVIRUS 2  TROPONIN I (HIGH SENSITIVITY)  TROPONIN I (HIGH SENSITIVITY)    EKG EKG Interpretation  Date/Time:  Sunday August 16 2018 01:51:16 EDT Ventricular Rate:  88 PR Interval:  130 QRS Duration: 78 QT Interval:  344 QTC Calculation: 416 R Axis:  14 Text Interpretation:  Normal sinus rhythm Normal ECG When compared with ECG of 08/13/2018, No significant change was found Confirmed by Delora Fuel ([id number]) on 08/16/2018 5:55:59 AM   Radiology Dg Chest 2 View  Result Date: 08/16/2018 CLINICAL DATA:  73 year old male with chest pain and shortness of breath. EXAM: CHEST - 2 VIEW COMPARISON:  Chest radiograph dated 08/13/2018 FINDINGS: Diffuse bilateral interstitial and subpleural densities similar to prior radiograph likely chronic and fibrosis. No new consolidative changes. There is no pleural effusion or pneumothorax. Stable cardiac silhouette. No acute osseous pathology. IMPRESSION: No active cardiopulmonary disease.  No interval change. Electronically Signed   By: Anner Crete M.D.   On: 08/16/2018 02:21    Procedures Procedures (including critical care time)  Medications Ordered in ED Medications  sodium chloride flush (NS) 0.9 % injection 3 mL (has no administration in time range)  ibuprofen (ADVIL) tablet 400 mg (400 mg Oral Given 08/16/18 0329)     Initial Impression / Assessment and Plan / ED Course  I have reviewed the triage vital signs and the nursing notes.  Pertinent labs & imaging results that were available during my care of the patient were reviewed by me and considered in my medical decision making (see chart for details).        73 year old male presents to the emergency department for persistent central, sharp chest pain.  Symptoms associated with some progressively worsening dyspnea on exertion.  Is prescribed to use supplemental oxygen for symptomatic hypoxemia secondary to his pulmonary fibrosis, but has been using it more than his baseline.   Ultimately, the patient has not felt back to baseline since his ablation for A. fib 1 month ago.  His initial chest pain work-up has been reassuring with negative troponin x2.  He has no signs of acute ischemia on EKG.  While his symptoms are slightly more atypical for ACS, his history is fairly complex and he would benefit from observation admission.  Case discussed with Dr[facility] Tamala Julian of [facility] who will assess in the ED for admission.   Final Clinical Impressions(s) / ED Diagnoses   Final diagnoses:  Chest pain, unspecified type  Dyspnea on exertion  History of pulmonary fibrosis    ED Discharge Orders    None       Antonietta Breach, PA-C 84/13/24 4010    Delora Fuel, MD 27/25/36 [phone]

## 2018-08-16 NOTE — ED Triage Notes (Signed)
Pt reports having cardiac ablation 7/10, onset Thursday of severe mid chest pain. Pt went to [facility] but still having the severe pain and sob. Hx of pulmonary fibrosis.

## 2018-08-16 NOTE — H&P (Signed)
History and Physical    Curtis [name] QQI:[id number] DOB: [birth year] DOA: 08/16/2018  Referring MD/NP/PA: Antonietta Breach, PA-C PCP: Kathyrn Drown, MD  Patient coming from: Home  Chief Complaint: Chest pain  I have personally briefly reviewed patient's old medical records in [place]   HPI: Curtis [name] is a 73 y.o. male with medical history significant of hypertension, dyslipidemia, atrial fibrillation s/p ablation on chronic anticoagulation, previous MI, pulmonary fibrosis with portable oxygen as needed, psoriasis, history of tobacco abuse and arthritis; who presented with complaints of chest pain over the last 3 days.  Symptoms initially were more left-sided and reports that it was difficult for him to catch his breath breathe.  Pain was sharp, pleuritic, and constant with radiation into his back.  Denies having any nausea, vomiting, or diaphoresis. Patient was seen at [facility] emergency department 3 days ago and was evaluated and found to have a negative troponin with reassuring EKG.  Symptoms decreased after he was given morphine and he was discharged home.  However, patient reported having to use his oxygen at night due to his inability to catch his breath.  He chronically has a intermittent cough that is unchanged from baseline.  Denies any recent sick contacts.  He reported having recurrence of chest pain that is more substernal with radiation into his left shoulder and neck.  Of note patient had recently had ablation for his A. fib on 7/10 by Dr. Curt Bears.  Patient denies having any significant fevers, palpitations, leg swelling, calf pain, recent antibiotics, steroid use, or family history of significant cardiac disease.  ED Course: Upon admission into the emergency department patient was seen to be afebrile with a temperature of 99.8 F blood pressures 115/99-143/71, and O2 saturation maintained on room air.  Labs significant for platelet count 122 and high-sensitivity  troponins negative x2.  EKG did not show any acute changes from previous performed 2 days ago.  Chest x-ray showing unchanged chronic interstitial disease consistent with fibrosis. He was given ibuprofen 400 mg x 1 dose with some mild improvement chest pain symptoms, but denied any change in shortness of breath.  Pain reported to be a 8-9 on admission is now down to a 4-6.  Review of systems: A complete 10 point review of systems was obtained and negative except for as noted above in HPI  Past Medical History:  Diagnosis Date  . Arthritis   . Asthma   . Atrial fibrillation ([place])   . Cataract   . Cervical spondylosis   . Chest pain    Emergency room October 24, 2011, no MI  //   Nuclear, October, 2013, adenosine, EF 61%, no scar or ischemia  . Dyslipidemia    Statin intolerant  . Dyspnea   . GERD (gastroesophageal reflux disease)   . Glaucoma   . Hearing loss   . Hypertension   . Hypertension   . Idiopathic pulmonary fibrosis ([place])   . Interstitial pulmonary fibrosis ([place]) 2012  . Left shoulder pain 2011  . Myocardial infarction ([place])   . Psoriasis   . Pulmonary fibrosis ([place])   . Statin intolerance     Past Surgical History:  Procedure Laterality Date  . ATRIAL FIBRILLATION ABLATION N/A 02/18/2018   Procedure: ATRIAL FIBRILLATION ABLATION;  Surgeon: Constance Haw, MD;  Location: [place] [facility];  Service: Cardiovascular;  Laterality: N/A;  . ATRIAL FIBRILLATION ABLATION N/A 07/17/2018   Procedure: ATRIAL FIBRILLATION ABLATION;  Surgeon: Constance Haw, MD;  Location:  [place] [facility];  Service: Cardiovascular;  Laterality: N/A;  . BRAIN SURGERY    . CARDIOVERSION N/A 12/13/2015   Procedure: CARDIOVERSION;  Surgeon: Josue Hector, MD;  Location: [place];  Service: Cardiovascular;  Laterality: N/A;  . CATARACT EXTRACTION W/PHACO Right 06/11/2012   Procedure: CATARACT EXTRACTION PHACO AND INTRAOCULAR LENS PLACEMENT ([place]);  Surgeon: Tonny Branch, MD;  Location: [facility];  Service: Ophthalmology;  Laterality: Right;  CDE:  14.43  . CATARACT EXTRACTION W/PHACO Left 06/29/2012   Procedure: CATARACT EXTRACTION PHACO AND INTRAOCULAR LENS PLACEMENT ([facility]);  Surgeon: Tonny Branch, MD;  Location: [facility];  Service: Ophthalmology;  Laterality: Left;  CDE:14.11  . CHOLECYSTECTOMY  05/08/2012  . CHOLECYSTECTOMY N/A 05/08/2012   Procedure: LAPAROSCOPIC CHOLECYSTECTOMY;  Surgeon: Harl Bowie, MD;  Location: [place];  Service: General;  Laterality: N/A;  . COLONOSCOPY    . LIVER BIOPSY N/A 05/08/2012   Procedure: LIVER BIOPSY;  Surgeon: Harl Bowie, MD;  Location: [place];  Service: General;  Laterality: N/A;  . NECK SURGERY     cervical disc with cliip  . SHOULDER ARTHROSCOPY WITH DISTAL CLAVICLE RESECTION Right 12/12/2016   Procedure: RIGHT SHOULDER ARTHROSCOPY, SUBACROMIAL DECOMPRESSION, DISTAL CLAVICLE RESECTION, WITH POSSIBLE MINI OPEN ROTATOR CUFF REPAIR;  Surgeon: [name], MD;  Location: [place];  Service: Orthopedics;  Laterality: Right;     reports that he quit smoking about 14 years ago. His smoking use included cigarettes. He has a 26.00 pack-year smoking history. He has never used smokeless tobacco. He reports that he does not drink alcohol or use drugs.  Allergies  Allergen Reactions  . Pirfenidone Other (See Comments)    Elevated bilirubin  . Acyclovir And Related Other (See Comments)    Unknown  . Oxycodone Itching  . Pravastatin Other (See Comments)    Chills    . Simvastatin Other (See Comments)    Chills and Leg pain  . Prednisone Anxiety and Other (See Comments)    Irritation, Can tolerate in small doses   . Tegretol [Carbamazepine] Other (See Comments)    dizziness    Family History  Problem Relation Age of Onset  . Hypertension Mother   . Congestive Heart Failure Mother   . COPD Mother   . Stroke Father   . Breast cancer Sister     Prior to Admission medications   Medication Sig Start Date End Date Taking? Authorizing  Provider  albuterol (PROVENTIL HFA;VENTOLIN HFA) 108 (90 Base) MCG/ACT inhaler Inhale 2 puffs into the lungs every 6 (six) hours as needed for wheezing. 04/08/16  Yes Kathyrn Drown, MD  ALPRAZolam Duanne Moron) 0.5 MG tablet Take 0.5-1 tablets (0.25-0.5 mg total) by mouth 2 (two) times daily as needed for anxiety or sleep. 07/22/18  Yes Kathyrn Drown, MD  apixaban (ELIQUIS) 5 MG TABS tablet Take 1 tablet (5 mg total) by mouth 2 (two) times daily. 07/27/18  Yes Sherran Needs, NP  brimonidine (ALPHAGAN) 0.2 % ophthalmic solution Place 1 drop into both eyes 2 (two) times daily. 07/30/18 07/30/19 Yes [provider]  calcium carbonate (TUMS - DOSED IN MG ELEMENTAL CALCIUM) 500 MG chewable tablet Chew 1 tablet by mouth daily as needed for indigestion or heartburn.   Yes [provider]  diltiazem (CARTIA XT) 120 MG 24 hr capsule TAKE 1 CAPSULE(120 MG) BY MOUTH DAILY Patient taking differently: Take 120 mg by mouth daily.  07/22/18  Yes Sherran Needs, NP  fexofenadine (ALLEGRA) 180 MG  tablet Take 180 mg by mouth daily as needed for allergies or rhinitis.   Yes [provider]  fluticasone (FLONASE) 50 MCG/ACT nasal spray Place 2 sprays into both nostrils daily. Patient taking differently: Place 1 spray into both nostrils daily.  01/02/16  Yes Kathyrn Drown, MD  HYDROcodone-acetaminophen (NORCO/VICODIN) 5-325 MG tablet Take 1 tablet by mouth 4 (four) times daily as needed for moderate pain. 07/22/18  Yes Luking, Scott A, MD  latanoprost (XALATAN) 0.005 % ophthalmic solution Place 1 drop into the left eye at bedtime.  07/30/18 07/30/19 Yes [provider]  HYDROcodone-acetaminophen (NORCO/VICODIN) 5-325 MG tablet Take one tablet by mouth QID Patient not taking: Reported on 08/16/2018 07/22/18   Kathyrn Drown, MD  HYDROcodone-acetaminophen (NORCO/VICODIN) 5-325 MG tablet Take one tablet by mouth QID Patient not taking: Reported on 08/16/2018 07/22/18   Kathyrn Drown, MD  OXYGEN  Inhale 2.5-2.75 L into the lungs as needed (for shortness of breath).     [provider]    Physical Exam:  Constitutional: Elderly male NAD, calm, comfortable Vitals:   08/16/18 0600 08/16/18 0615 08/16/18 0630 08/16/18 0715  BP: 132/85 (!) 115/99 127/82 118/68  Pulse: 73 74 74 72  Resp: 17 17 14 14   Temp:      TempSrc:      SpO2: 97% 97% 96% 99%   Eyes: PERRL, lids and conjunctivae normal ENMT: Mucous membranes are moist. Posterior pharynx clear of any exudate or lesions.  Neck: normal, supple, no masses, no thyromegaly Respiratory: clear to auscultation bilaterally, no wheezing, no crackles. Normal respiratory effort. No accessory muscle use.  Cardiovascular: Regular rate and rhythm, no murmurs / rubs / gallops.  No tenderness to palpation.  No extremity edema. 2+ pedal pulses. No carotid bruits.  Abdomen: no tenderness, no masses palpated. No hepatosplenomegaly. Bowel sounds positive.  Musculoskeletal: no clubbing / cyanosis. No joint deformity upper and lower extremities. Good ROM, no contractures. Normal muscle tone.  Skin: no rashes, lesions, ulcers. No induration Neurologic: CN 2-12 grossly intact. Sensation intact, DTR normal. Strength 5/5 in all 4.  Psychiatric: Normal judgment and insight. Alert and oriented x 3. Normal mood.     Labs on Admission: I have personally reviewed following labs and imaging studies  CBC: Recent Labs  Lab 08/13/18 1730 08/16/18 0200  WBC 11.3* 9.1  HGB 15.3 14.3  HCT 47.2 42.8  MCV 92.9 93.4  PLT 124* 144*   Basic Metabolic Panel: Recent Labs  Lab 08/13/18 1730 08/16/18 0200  NA 137 137  K 4.1 4.5  CL 101 101  CO2 28 25  GLUCOSE 89 107*  BUN 10 12  CREATININE 0.83 1.02  CALCIUM 8.7* 9.0   GFR: Estimated Creatinine Clearance: 82.2 mL/min (by C-G formula based on SCr of 1.02 mg/dL). Liver Function Tests: No results for input(s): AST, ALT, ALKPHOS, BILITOT, PROT, ALBUMIN in the last 168 hours. No results for  input(s): LIPASE, AMYLASE in the last 168 hours. No results for input(s): AMMONIA in the last 168 hours. Coagulation Profile: No results for input(s): INR, PROTIME in the last 168 hours. Cardiac Enzymes: No results for input(s): CKTOTAL, CKMB, CKMBINDEX, TROPONINI in the last 168 hours. BNP (last 3 results) No results for input(s): PROBNP in the last 8760 hours. HbA1C: No results for input(s): HGBA1C in the last 72 hours. CBG: Recent Labs  Lab 08/13/18 1807  GLUCAP 85   Lipid Profile: No results for input(s): CHOL, HDL, LDLCALC, TRIG, CHOLHDL, LDLDIRECT in the last  72 hours. Thyroid Function Tests: No results for input(s): TSH, T4TOTAL, FREET4, T3FREE, THYROIDAB in the last 72 hours. Anemia Panel: No results for input(s): VITAMINB12, FOLATE, FERRITIN, TIBC, IRON, RETICCTPCT in the last 72 hours. Urine analysis:    Component Value Date/Time   COLORURINE AMBER (A) 05/26/2016 0020   APPEARANCEUR CLEAR 05/26/2016 0020   LABSPEC 1.031 (H) 05/26/2016 0020   PHURINE 5.0 05/26/2016 0020   GLUCOSEU NEGATIVE 05/26/2016 0020   HGBUR NEGATIVE 05/26/2016 0020   BILIRUBINUR ++ 09/16/2016 1547   KETONESUR NEGATIVE 05/26/2016 0020   PROTEINUR 30 (A) 05/26/2016 0020   UROBILINOGEN 4.0 (H) 04/04/2012 0505   NITRITE NEGATIVE 05/26/2016 0020   LEUKOCYTESUR NEGATIVE 05/26/2016 0020   Sepsis Labs: No results found for this or any previous visit (from the past 240 hour(s)).   Radiological Exams on Admission: Dg Chest 2 View  Result Date: 08/16/2018 CLINICAL DATA:  73 year old male with chest pain and shortness of breath. EXAM: CHEST - 2 VIEW COMPARISON:  Chest radiograph dated 08/13/2018 FINDINGS: Diffuse bilateral interstitial and subpleural densities similar to prior radiograph likely chronic and fibrosis. No new consolidative changes. There is no pleural effusion or pneumothorax. Stable cardiac silhouette. No acute osseous pathology. IMPRESSION: No active cardiopulmonary disease.  No interval  change. Electronically Signed   By: Anner Crete M.D.   On: 08/16/2018 02:21    EKG: Independently reviewed.  Sinus rhythm 88 bpm  Assessment/Plan Chest pain: Acute.  Patient is with complaints of chest pain.  Seen previously at any pain 2 days ago.   Troponins negative x3 now and EKG without any significant ischemic changes.  Patient with recent CT angiogram coronary study on 2/6 and 7/6 show aortic atherosclerosis.  Heart score 4.  Atypical presentation.  Pulmonary embolus seems less likely as patient has been on anticoagulation of Eliquis.  Discussed case with Dr. Curt Bears of questions the possibility of pericarditis following the ablation. -Admit to a telemetry bed -Colchicine 0.6 mg daily   Persistent atrial fibrillation on chronic anticoagulation: Patient currently in sinus rhythm after just having ablation with Dr. Curt Bears on 7/10.  -Continue Eliquis  Pulmonary fibrosis: Chronic.  Patient currently using 2 to 2.5 L of oxygen as needed.  Chest x-ray consistent with pulmonary fibrosis.  Recent CT scans also showing fibrosis. -Nasal cannula oxygen as needed -Recommend outpatient follow-up with Dr. Chase Caller  Thrombocytopenia: Chronic.  Platelet count 122 which appears near his baseline.  CT imaging of the abdomen and pelvis revealed signs of cirrhosis that may also be associated. -May warrant further work-up in outpatient setting  Dyslipidemia: Total cholesterol 119, HDL 23, LDL 79, triglycerides 84.  Patient with previous history of allergies to statins. -Continue outpatient follow-up with PCP  Capsular glaucoma: Patient followed by Dr. Clemens Catholic at [place] current home regimen of ophthalmic  DVT prophylaxis: Eliquis Code Status: Full Family Communication: Discussed plan of care with the patient and his wife present at bedside Disposition Plan: Possible discharge home later today Consults called: Discussed case over the phone with Dr. Curt Bears Admission  status: Observation  Norval Morton MD [facility] Pager [phone]   If 7PM-7AM, please contact night-coverage [email or web address] Password [email or web address]  08/16/2018, 7:53 AM

## 2018-08-16 NOTE — ED Notes (Signed)
The pt is very angry bewcause he has to wait to be seen for5 hours  He has had chest pain since Thursday with sob  He was seen at [facility] ed Thursday and they wanted him to stay but he refused to stay. He woke up this am approx 0100am with the dsame chest pain more severe  At present no chest pain

## 2018-08-16 NOTE — ED Notes (Signed)
Patient ambulated approximately 25 yards and only had issues at the end. C/o chest pain and shortness of breath. O2 sats while ambulating on 2.3 lpm oxygen via [place] was 94-97%.

## 2018-08-17 ENCOUNTER — Ambulatory Visit (HOSPITAL_BASED_OUTPATIENT_CLINIC_OR_DEPARTMENT_OTHER): Payer: PPO

## 2018-08-17 ENCOUNTER — Ambulatory Visit (HOSPITAL_COMMUNITY): Payer: PPO | Admitting: Physician Assistant

## 2018-08-17 ENCOUNTER — Encounter (HOSPITAL_COMMUNITY): Payer: Self-pay | Admitting: Cardiology

## 2018-08-17 DIAGNOSIS — R071 Chest pain on breathing: Secondary | ICD-10-CM | POA: Diagnosis not present

## 2018-08-17 DIAGNOSIS — I309 Acute pericarditis, unspecified: Secondary | ICD-10-CM

## 2018-08-17 DIAGNOSIS — R0789 Other chest pain: Secondary | ICD-10-CM

## 2018-08-17 DIAGNOSIS — R079 Chest pain, unspecified: Secondary | ICD-10-CM | POA: Diagnosis not present

## 2018-08-17 DIAGNOSIS — I4819 Other persistent atrial fibrillation: Secondary | ICD-10-CM | POA: Diagnosis not present

## 2018-08-17 LAB — BASIC METABOLIC PANEL
Anion gap: 6 (ref 5–15)
BUN: 11 mg/dL (ref 8–23)
CO2: 27 mmol/L (ref 22–32)
Calcium: 8.6 mg/dL — ABNORMAL LOW (ref 8.9–10.3)
Chloride: 103 mmol/L (ref 98–111)
Creatinine, Ser: 0.95 mg/dL (ref 0.61–1.24)
GFR calc Af Amer: >60 mL/min (ref >60)
GFR calc non Af Amer: >60 mL/min (ref >60)
Glucose, Bld: 93 mg/dL (ref 70–99)
Potassium: 3.9 mmol/L (ref 3.5–5.1)
Sodium: 136 mmol/L (ref 135–145)

## 2018-08-17 LAB — CBC WITH DIFFERENTIAL/PLATELET
Abs Immature Granulocytes: 0.03 10*3/uL (ref 0.00–0.07)
Basophils Absolute: 0 10*3/uL (ref 0.0–0.1)
Basophils Relative: 0 %
Eosinophils Absolute: 0.4 10*3/uL (ref 0.0–0.5)
Eosinophils Relative: 4 %
HCT: 39.2 % (ref 39.0–52.0)
Hemoglobin: 13 g/dL (ref 13.0–17.0)
Immature Granulocytes: 0 %
Lymphocytes Relative: 22 %
Lymphs Abs: 2.4 10*3/uL (ref 0.7–4.0)
MCH: 31.4 pg (ref 26.0–34.0)
MCHC: 33.2 g/dL (ref 30.0–36.0)
MCV: 94.7 fL (ref 80.0–100.0)
Monocytes Absolute: 1.1 10*3/uL — ABNORMAL HIGH (ref 0.1–1.0)
Monocytes Relative: 10 %
Neutro Abs: 6.7 10*3/uL (ref 1.7–7.7)
Neutrophils Relative %: 64 %
Platelets: 106 10*3/uL — ABNORMAL LOW (ref 150–400)
RBC: 4.14 MIL/uL — ABNORMAL LOW (ref 4.22–5.81)
RDW: 12.2 % (ref 11.5–15.5)
WBC: 10.7 10*3/uL — ABNORMAL HIGH (ref 4.0–10.5)
nRBC: 0 % (ref 0.0–0.2)

## 2018-08-17 LAB — ECHOCARDIOGRAM COMPLETE
Height: 73 in
Weight: 3597.91 oz

## 2018-08-17 MED ORDER — COLCHICINE 0.6 MG PO TABS: 0.6000 mg | ORAL_TABLET | Freq: Two times a day (BID) | ORAL | 0 refills | Status: AC

## 2018-08-17 MED ORDER — IBUPROFEN 600 MG PO TABS
600.0000 mg | ORAL_TABLET | Freq: Three times a day (TID) | ORAL | Status: DC
  Administered 2018-08-17: 600 mg via ORAL
  Filled 2018-08-17: qty 1

## 2018-08-17 MED ORDER — PANTOPRAZOLE SODIUM 40 MG PO TBEC: 40.0000 mg | DELAYED_RELEASE_TABLET | Freq: Every day | ORAL | 0 refills | Status: AC

## 2018-08-17 MED ORDER — PANTOPRAZOLE SODIUM 40 MG PO TBEC
40.0000 mg | DELAYED_RELEASE_TABLET | Freq: Every day | ORAL | Status: DC
  Administered 2018-08-17: 40 mg via ORAL
  Filled 2018-08-17: qty 1

## 2018-08-17 MED ORDER — IBUPROFEN 200 MG PO TABS
400.0000 mg | ORAL_TABLET | Freq: Once | ORAL | Status: AC | PRN
  Administered 2018-08-17: 400 mg via ORAL
  Filled 2018-08-17: qty 2

## 2018-08-17 MED ORDER — IBUPROFEN 200 MG PO TABS
400.0000 mg | ORAL_TABLET | Freq: Four times a day (QID) | ORAL | Status: DC | PRN
  Administered 2018-08-17: 400 mg via ORAL
  Filled 2018-08-17: qty 2

## 2018-08-17 MED ORDER — IBUPROFEN 600 MG PO TABS: 600.0000 mg | ORAL_TABLET | Freq: Three times a day (TID) | ORAL | 0 refills | Status: AC

## 2018-08-17 MED FILL — COLCHICINE 0.6 MG TABS: 0.6 | 30 days supply | Qty: 60 | Fill #0

## 2018-08-17 MED FILL — PANTOPRAZOLE SOD DR 40 MG T: 40 | 90 days supply | Qty: 90 | Fill #0

## 2018-08-17 MED FILL — IBUPROFEN 600 MG TABLET: 600 | 20 days supply | Qty: 62 | Fill #0

## 2018-08-17 NOTE — Discharge Summary (Signed)
Physician Discharge Summary  Curtis [name] OZH:[id number] DOB: Jan 25, 1945 DOA: 08/16/2018  PCP: Curtis Drown, MD  Admit date: 08/16/2018 Discharge date: 08/17/2018  Time spent: 45 minutes  Recommendations for Outpatient Follow-up:  1. Follow up with cardiology 3-4 weeks for evaluation of sypmptoms   Discharge Diagnoses:  Principal Problem:   Chest pain Active Problems:   Hypertension   Persistent atrial fibrillation   Dyslipidemia   Interstitial pulmonary disease, unspecified (HCC)   Thrombocytopenia (HCC)   Glaucoma   Discharge Condition: stable  Diet recommendation: heart healthy  Filed Weights   08/16/18 1230 08/17/18 0624  Weight: 102.1 kg 102 kg    History of present illness:  Curtis [name] is a 73 y.o. male with medical history significant of hypertension, dyslipidemia, atrial fibrillation s/p ablation on chronic anticoagulation, previous MI, pulmonary fibrosis with portable oxygen as needed, psoriasis, history of tobacco abuse and arthritis; who presented 08/16/18 with complaints of chest pain over the previous 3 days.  Symptoms initially were more left-sided and reported that it was difficult for him to catch his breath breathe.  Pain was sharp, pleuritic, and constant with radiation into his back.  Denied having any nausea, vomiting, or diaphoresis. Patient was seen at [facility] emergency department 3 days prior and was evaluated and found to have a negative troponin with reassuring EKG.  Symptoms decreased after he was given morphine and he was discharged home.  However, patient reported having to use his oxygen at night due to his inability to catch his breath.  He chronically has a intermittent cough that is unchanged from baseline.  Denied any recent sick contacts.  He reported having recurrence of chest pain that was more substernal with radiation into his left shoulder and neck.  Of note patient had recently had ablation for his A. fib on 7/10 by Curtis [name].   Patient denied having any significant fevers, palpitations, leg swelling, calf pain, recent antibiotics, steroid use, or family history of significant cardiac disease.  Hospital Course:  Chest pain: Acute.  Patient is with complaints of chest pain.  Seen previously at any pain 2 days prior. Troponins negative x3  and EKG without any significant ischemic changes.  Patient with recent CT angiogram coronary study on 2/6 and 7/6 show aortic atherosclerosis.  Heart score 4.  Atypical presentation.  Pulmonary embolus seems less likely as patient has been on anticoagulation of Eliquis. Echo with normal LVEF. Evaluated by cardiology who opine likely pericarditis. Recommend 2 weeks for ibuprofen and 3 months of colchicine. Will also provide protonix for 3 months.   Persistent atrial fibrillation on chronic anticoagulation: Patient currently in sinus rhythm s/p ablation with Curtis [name] on 7/10.  -Continue Eliquis  Pulmonary fibrosis: Chronic.  Patient currently using 2 to 2.5 L of oxygen as needed.  Chest x-ray consistent with pulmonary fibrosis.  Recent CT scans also showing fibrosis. Has not followed up with pulmonology in over 1 year. Recommend outpatient follow-up with Curtis [name]  Thrombocytopenia: Chronic.  Platelet count 122 which appears near his baseline.  CT imaging of the abdomen and pelvis revealed signs of cirrhosis that may also be associated.  Dyslipidemia: Total cholesterol 119, HDL 23, LDL 79, triglycerides 84.  Patient with previous history of allergies to statins.  Capsular glaucoma: Patient followed by Curtis [name] at [place] current home regimen of ophthalmic   Procedures:  echo  Consultations:  [facility]  Discharge Exam: Vitals:   08/17/18 0835 08/17/18 1427  BP: 122/68  117/75  Pulse: 77 64  Resp:    Temp: (!) 97.5 F (36.4 C) 97.7 F (36.5 C)  SpO2: 93%     General: awake alert oriented irritable Cardiovascular: rrr no mgr no LE  edema Respiratory: mild increased work to breathing no crackles or wheezes  Discharge Instructions   Discharge Instructions    Call MD for:  difficulty breathing, headache or visual disturbances   Complete by: As directed    Call MD for:  severe uncontrolled pain   Complete by: As directed    Call MD for:  temperature >100.4   Complete by: As directed    Diet - low sodium heart healthy   Complete by: As directed    Discharge instructions   Complete by: As directed    Take medications as prescribed Follow up with cardiology 3-4 weeks   Increase activity slowly   Complete by: As directed      Allergies as of 08/17/2018      Reactions   Pirfenidone Other (See Comments)   Elevated bilirubin   Acyclovir And Related Other (See Comments)   Unknown   Oxycodone Itching   Pravastatin Other (See Comments)   Chills    Simvastatin Other (See Comments)   Chills and Leg pain   Prednisone Anxiety, Other (See Comments)   Irritation, Can tolerate in small doses   Tegretol [carbamazepine] Other (See Comments)   dizziness      Medication List    TAKE these medications   albuterol 108 (90 Base) MCG/ACT inhaler Commonly known as: VENTOLIN HFA Inhale 2 puffs into the lungs every 6 (six) hours as needed for wheezing.   ALPRAZolam 0.5 MG tablet Commonly known as: XANAX Take 0.5-1 tablets (0.25-0.5 mg total) by mouth 2 (two) times daily as needed for anxiety or sleep.   apixaban 5 MG Tabs tablet Commonly known as: Eliquis Take 1 tablet (5 mg total) by mouth 2 (two) times daily.   brimonidine 0.2 % ophthalmic solution Commonly known as: ALPHAGAN Place 1 drop into both eyes 2 (two) times daily.   calcium carbonate 500 MG chewable tablet Commonly known as: TUMS - dosed in mg elemental calcium Chew 1 tablet by mouth daily as needed for indigestion or heartburn.   colchicine 0.6 MG tablet Take 1 tablet (0.6 mg total) by mouth 2 (two) times daily.   diltiazem 120 MG 24 hr  capsule Commonly known as: Cartia XT TAKE 1 CAPSULE(120 MG) BY MOUTH DAILY What changed:   how much to take  how to take this  when to take this  additional instructions   fexofenadine 180 MG tablet Commonly known as: ALLEGRA Take 180 mg by mouth daily as needed for allergies or rhinitis.   fluticasone 50 MCG/ACT nasal spray Commonly known as: Flonase Place 2 sprays into both nostrils daily. What changed: how much to take   HYDROcodone-acetaminophen 5-325 MG tablet Commonly known as: NORCO/VICODIN Take 1 tablet by mouth 4 (four) times daily as needed for moderate pain. What changed: Another medication with the same name was removed. Continue taking this medication, and follow the directions you see here.   ibuprofen 600 MG tablet Commonly known as: ADVIL Take 1 tablet (600 mg total) by mouth 3 (three) times daily for 14 days.   latanoprost 0.005 % ophthalmic solution Commonly known as: XALATAN Place 1 drop into the left eye at bedtime.   OXYGEN Inhale 2.5-2.75 L into the lungs as needed (for shortness of breath).   pantoprazole  40 MG tablet Commonly known as: PROTONIX Take 1 tablet (40 mg total) by mouth daily at 6 (six) AM.      Allergies  Allergen Reactions  . Pirfenidone Other (See Comments)    Elevated bilirubin  . Acyclovir And Related Other (See Comments)    Unknown  . Oxycodone Itching  . Pravastatin Other (See Comments)    Chills    . Simvastatin Other (See Comments)    Chills and Leg pain  . Prednisone Anxiety and Other (See Comments)    Irritation, Can tolerate in small doses   . Tegretol [Carbamazepine] Other (See Comments)    dizziness      The results of significant diagnostics from this hospitalization (including imaging, microbiology, ancillary and laboratory) are listed below for reference.    Significant Diagnostic Studies: Dg Chest 2 View  Result Date: 08/16/2018 CLINICAL DATA:  73 year old male with chest pain and shortness of  breath. EXAM: CHEST - 2 VIEW COMPARISON:  Chest radiograph dated 08/13/2018 FINDINGS: Diffuse bilateral interstitial and subpleural densities similar to prior radiograph likely chronic and fibrosis. No new consolidative changes. There is no pleural effusion or pneumothorax. Stable cardiac silhouette. No acute osseous pathology. IMPRESSION: No active cardiopulmonary disease.  No interval change. Electronically Signed   By: Anner Crete M.D.   On: 08/16/2018 02:21   Dg Chest Portable 1 View  Result Date: 08/13/2018 CLINICAL DATA:  Chest pain EXAM: PORTABLE CHEST 1 VIEW COMPARISON:  05/26/2016, 04/21/2016 FINDINGS: Low lung volumes. Bilateral pulmonary fibrosis, grossly unchanged allowing for portable technique. Mild cardiomegaly. No pneumothorax. IMPRESSION: Low lung volumes with bilateral fibrosis. No definite acute superimposed opacity. Mild cardiomegaly Electronically Signed   By: Donavan Foil M.D.   On: 08/13/2018 18:21    Microbiology: Recent Results (from the past 240 hour(s))  SARS CORONAVIRUS 2 Nasal Swab Aptima Multi Swab     Status: None   Collection Time: 08/16/18  8:55 AM   Specimen: Aptima Multi Swab; Nasal Swab  Result Value Ref Range Status   SARS Coronavirus 2 NEGATIVE NEGATIVE Final    Comment: (NOTE) SARS-CoV-2 target nucleic acids are NOT DETECTED. The SARS-CoV-2 RNA is generally detectable in upper and lower respiratory specimens during the acute phase of infection. Negative results do not preclude SARS-CoV-2 infection, do not rule out co-infections with other pathogens, and should not be used as the sole basis for treatment or other patient management decisions. Negative results must be combined with clinical observations, patient history, and epidemiological information. The expected result is Negative. Fact Sheet for Patients: SugarRoll.be Fact Sheet for Healthcare Providers: https://www.woods-mathews.com/ This test is not  yet approved or cleared by the [place] FDA and  has been authorized for detection and/or diagnosis of SARS-CoV-2 by FDA under an Emergency Use Authorization (EUA). This EUA will remain  in effect (meaning this test can be used) for the duration of the COVID-19 declaration under Section 56 4(b)(1) of the Act, 21 U.S.C. section 360bbb-3(b)(1), unless the authorization is terminated or revoked sooner. Performed at [facility], [place] [address] [place], [place] [address]      Labs: Basic Metabolic Panel: Recent Labs  Lab 08/13/18 1730 08/16/18 0200 08/17/18 0855  NA 137 137 136  K 4.1 4.5 3.9  CL 101 101 103  CO2 28 25 27   GLUCOSE 89 107* 93  BUN 10 12 11   CREATININE 0.83 1.02 0.95  CALCIUM 8.7* 9.0 8.6*   Liver Function Tests: No results for input(s): AST, ALT, ALKPHOS,  BILITOT, PROT, ALBUMIN in the last 168 hours. No results for input(s): LIPASE, AMYLASE in the last 168 hours. No results for input(s): AMMONIA in the last 168 hours. CBC: Recent Labs  Lab 08/13/18 1730 08/16/18 0200 08/17/18 0855  WBC 11.3* 9.1 10.7*  NEUTROABS  --   --  6.7  HGB 15.3 14.3 13.0  HCT 47.2 42.8 39.2  MCV 92.9 93.4 94.7  PLT 124* 122* 106*   Cardiac Enzymes: No results for input(s): CKTOTAL, CKMB, CKMBINDEX, TROPONINI in the last 168 hours. BNP: BNP (last 3 results) No results for input(s): BNP in the last 8760 hours.  ProBNP (last 3 results) No results for input(s): PROBNP in the last 8760 hours.  CBG: Recent Labs  Lab 08/13/18 1807  GLUCAP 85       Signed:  Radene Gunning NP [facility] 08/17/2018, 2:58 PM

## 2018-08-17 NOTE — Progress Notes (Signed)
  Echocardiogram 2D Echocardiogram has been performed.  Curtis [name] 08/17/2018, 11:33 AM

## 2018-08-17 NOTE — Consult Note (Signed)
Cardiology Consultation:   Patient ID: Curtis [name] MRN: [id number]; DOB: 1945/04/27  Admit date: 08/16/2018 Date of Consult: 08/17/2018  Primary Care Provider: Kathyrn Drown, MD Primary Cardiologist: Jenkins Rouge, MD  Primary Electrophysiologist:  Will Meredith Leeds, MD    Patient Profile:   Curtis [name] is a 73 y.o. male with a hx of a fib with ablation 02/18/18 and repeat ablation 07/17/18, pulmonary fibrosis, statin intolerance, GERD, HTN and chart notes MI but no cath is seen who is being seen today for the evaluation of chest pain at the request of Dr. Grandville Silos.  History of Present Illness:   Mr. Wortmann with above hx and two a fib ablations this year, last on 07/17/18 and hx of nuc study 02/02/16 that was low risk , small defect of mild severity present in the basal inferolateral and mid inferolateral location.  Compared to 2013, the inferolateral perfusion abnormality is new.  Echo 11/23/15 with EF 55% no RWMA though could not be excluded on basis of that echo.  Mild calcific aortic stenosis and trivial TR with PASP of 30 mmHg, mild Rt atrial enlargement.    On Thursday the 6-7th was seen at [facility] with chest Pain on Lt since his ablation on the 10th, would come and go.  He became angry at staff and left.  His troponin was 7 and  D-Dimer 0.37  He left before second troponin.   His pain returned and increased 7/10 so came to [facility] ER.  He was also SOB. Has been on anticoagulation with eliquis Hospitalist did discuss with Dr. Curt Bears yesterday and for possible pericarditis.  Pt was started on colchicine 0.6 mg BID.    For his pulmonary fibrosis he was on home 02 prn, 2 to 2.5 L  Chronic thrombocytopenia pts 122 on admit.    EKG:  The EKG was personally reviewed and demonstrates:  SR no acute ST changes from prior, T waves peaked in V 2-3 but has had this.  Telemetry:  Telemetry was personally reviewed and demonstrates:  SR  Troponin 7 and 8  Na 136, K+ 3.9, Cr  0.95,  Hgb 13, Hct 39 plts 106 wbc 10.7 Tchol 119, HDL 23, LDL 79 COVID neg  Echo done results pending  BP 122/68       Heart Pathway Score:     Past Medical History:  Diagnosis Date  . Arthritis   . Asthma   . Atrial fibrillation ([place])   . Cataract   . Cervical spondylosis   . Chest pain    Emergency room October 24, 2011, no MI  //   Nuclear, October, 2013, adenosine, EF 61%, no scar or ischemia  . Dyslipidemia    Statin intolerant  . Dyspnea   . GERD (gastroesophageal reflux disease)   . Glaucoma   . Hearing loss   . Hypertension   . Hypertension   . Idiopathic pulmonary fibrosis ([place])   . Interstitial pulmonary fibrosis ([place]) 2012  . Left shoulder pain 2011  . Myocardial infarction ([place])   . Psoriasis   . Pulmonary fibrosis ([place])   . Statin intolerance     Past Surgical History:  Procedure Laterality Date  . ATRIAL FIBRILLATION ABLATION N/A 02/18/2018   Procedure: ATRIAL FIBRILLATION ABLATION;  Surgeon: Constance Haw, MD;  Location: [place] [facility];  Service: Cardiovascular;  Laterality: N/A;  . ATRIAL FIBRILLATION ABLATION N/A 07/17/2018   Procedure: ATRIAL FIBRILLATION ABLATION;  Surgeon: Constance Haw, MD;  Location: [place]  [facility];  Service: Cardiovascular;  Laterality: N/A;  . BRAIN SURGERY    . CARDIOVERSION N/A 12/13/2015   Procedure: CARDIOVERSION;  Surgeon: Josue Hector, MD;  Location: [place];  Service: Cardiovascular;  Laterality: N/A;  . CATARACT EXTRACTION W/PHACO Right 06/11/2012   Procedure: CATARACT EXTRACTION PHACO AND INTRAOCULAR LENS PLACEMENT ([place]);  Surgeon: Tonny Branch, MD;  Location: [facility];  Service: Ophthalmology;  Laterality: Right;  CDE:  14.43  . CATARACT EXTRACTION W/PHACO Left 06/29/2012   Procedure: CATARACT EXTRACTION PHACO AND INTRAOCULAR LENS PLACEMENT (I[facility]);  Surgeon: Tonny Branch, MD;  Location: [facility];  Service: Ophthalmology;  Laterality: Left;  CDE:14.11  . CHOLECYSTECTOMY  05/08/2012  . CHOLECYSTECTOMY  N/A 05/08/2012   Procedure: LAPAROSCOPIC CHOLECYSTECTOMY;  Surgeon: Harl Bowie, MD;  Location: [place];  Service: General;  Laterality: N/A;  . COLONOSCOPY    . LIVER BIOPSY N/A 05/08/2012   Procedure: LIVER BIOPSY;  Surgeon: Harl Bowie, MD;  Location: [place];  Service: General;  Laterality: N/A;  . NECK SURGERY     cervical disc with cliip  . SHOULDER ARTHROSCOPY WITH DISTAL CLAVICLE RESECTION Right 12/12/2016   Procedure: RIGHT SHOULDER ARTHROSCOPY, SUBACROMIAL DECOMPRESSION, DISTAL CLAVICLE RESECTION, WITH POSSIBLE MINI OPEN ROTATOR CUFF REPAIR;  Surgeon: [name], MD;  Location: [place];  Service: Orthopedics;  Laterality: Right;     Home Medications:  Prior to Admission medications   Medication Sig Start Date End Date Taking? Authorizing Provider  albuterol (PROVENTIL HFA;VENTOLIN HFA) 108 (90 Base) MCG/ACT inhaler Inhale 2 puffs into the lungs every 6 (six) hours as needed for wheezing. 04/08/16  Yes Kathyrn Drown, MD  ALPRAZolam Duanne Moron) 0.5 MG tablet Take 0.5-1 tablets (0.25-0.5 mg total) by mouth 2 (two) times daily as needed for anxiety or sleep. 07/22/18  Yes Kathyrn Drown, MD  apixaban (ELIQUIS) 5 MG TABS tablet Take 1 tablet (5 mg total) by mouth 2 (two) times daily. 07/27/18  Yes Sherran Needs, NP  brimonidine (ALPHAGAN) 0.2 % ophthalmic solution Place 1 drop into both eyes 2 (two) times daily. 07/30/18 07/30/19 Yes [provider]  calcium carbonate (TUMS - DOSED IN MG ELEMENTAL CALCIUM) 500 MG chewable tablet Chew 1 tablet by mouth daily as needed for indigestion or heartburn.   Yes [provider]  diltiazem (CARTIA XT) 120 MG 24 hr capsule TAKE 1 CAPSULE(120 MG) BY MOUTH DAILY Patient taking differently: Take 120 mg by mouth daily.  07/22/18  Yes Sherran Needs, NP  fexofenadine (ALLEGRA) 180 MG tablet Take 180 mg by mouth daily as needed for allergies or rhinitis.   Yes [provider]  fluticasone (FLONASE) 50 MCG/ACT nasal spray  Place 2 sprays into both nostrils daily. Patient taking differently: Place 1 spray into both nostrils daily.  01/02/16  Yes Kathyrn Drown, MD  HYDROcodone-acetaminophen (NORCO/VICODIN) 5-325 MG tablet Take 1 tablet by mouth 4 (four) times daily as needed for moderate pain. 07/22/18  Yes Luking, Scott A, MD  latanoprost (XALATAN) 0.005 % ophthalmic solution Place 1 drop into the left eye at bedtime.  07/30/18 07/30/19 Yes [provider]  HYDROcodone-acetaminophen (NORCO/VICODIN) 5-325 MG tablet Take one tablet by mouth QID Patient not taking: Reported on 08/16/2018 07/22/18   Kathyrn Drown, MD  HYDROcodone-acetaminophen (NORCO/VICODIN) 5-325 MG tablet Take one tablet by mouth QID Patient not taking: Reported on 08/16/2018 07/22/18   Kathyrn Drown, MD  OXYGEN Inhale 2.5-2.75 L into the lungs as needed (for shortness of breath).  [provider]    Inpatient Medications: Scheduled Meds: . apixaban  5 mg Oral BID  . brimonidine  1 drop Both Eyes BID  . colchicine  0.6 mg Oral BID  . diltiazem  120 mg Oral Daily  . fluticasone  1 spray Each Nare Daily  . ibuprofen  600 mg Oral TID  . latanoprost  1 drop Left Eye QHS  . pantoprazole  40 mg Oral Q0600   Continuous Infusions:  PRN Meds: acetaminophen, albuterol, ALPRAZolam, calcium carbonate, HYDROcodone-acetaminophen, ibuprofen, ondansetron (ZOFRAN) IV  Allergies:    Allergies  Allergen Reactions  . Pirfenidone Other (See Comments)    Elevated bilirubin  . Acyclovir And Related Other (See Comments)    Unknown  . Oxycodone Itching  . Pravastatin Other (See Comments)    Chills    . Simvastatin Other (See Comments)    Chills and Leg pain  . Prednisone Anxiety and Other (See Comments)    Irritation, Can tolerate in small doses   . Tegretol [Carbamazepine] Other (See Comments)    dizziness    Social History:   Social History   Socioeconomic History  . Marital status: Married    Spouse name: Not on file  .  Number of children: Not on file  . Years of education: Not on file  . Highest education level: Not on file  Occupational History  . Not on file  Social Needs  . Financial resource strain: Not on file  . Food insecurity    Worry: Not on file    Inability: Not on file  . Transportation needs    Medical: Not on file    Non-medical: Not on file  Tobacco Use  . Smoking status: Former Smoker    Packs/day: 0.50    Years: 52.00    Pack years: 26.00    Types: Cigarettes    Quit date: 09/08/2003    Years since quitting: 14.9  . Smokeless tobacco: Never Used  Substance and Sexual Activity  . Alcohol use: No    Comment: quit drinking 2004  . Drug use: No  . Sexual activity: Yes    Birth control/protection: None  Lifestyle  . Physical activity    Days per week: Not on file    Minutes per session: Not on file  . Stress: Not on file  Relationships  . Social Herbalist on phone: Not on file    Gets together: Not on file    Attends religious service: Not on file    Active member of club or organization: Not on file    Attends meetings of clubs or organizations: Not on file    Relationship status: Not on file  . Intimate partner violence    Fear of current or ex partner: Not on file    Emotionally abused: Not on file    Physically abused: Not on file    Forced sexual activity: Not on file  Other Topics Concern  . Not on file  Social History Narrative  . Not on file    Family History:    Family History  Problem Relation Age of Onset  . Hypertension Mother   . Congestive Heart Failure Mother   . COPD Mother   . Stroke Father   . Breast cancer Sister      ROS:  Please see the history of present illness.  General:no colds or fevers, no weight changes Skin:no rashes or ulcers HEENT:no blurred vision, no congestion CV:see  HPI PUL:see HPI GI:no diarrhea constipation or melena, no indigestion, + GERD GU:no hematuria, no dysuria MS:no joint pain, no claudication  Neuro:no syncope, no lightheadedness Endo:no diabetes, no thyroid disease   All other ROS reviewed and negative.     Physical Exam/Data:   Vitals:   08/16/18 2012 08/17/18 0624 08/17/18 0800 08/17/18 0835  BP: 113/69 108/61  122/68  Pulse: 71 67  77  Resp: 19 19 19    Temp: 98.4 F (36.9 C) 97.6 F (36.4 C)  (!) 97.5 F (36.4 C)  TempSrc: Oral Oral  Oral  SpO2: 95% 92%  93%  Weight:  102 kg    Height:   6\' 1"  (1.854 m)     Intake/Output Summary (Last 24 hours) at 08/17/2018 1059 Last data filed at 08/17/2018 0835 Gross per 24 hour  Intake 1044 ml  Output -  Net 1044 ml   Last 3 Weights 08/17/2018 08/16/2018 08/13/2018  Weight (lbs) 224 lb 13.9 oz 225 lb 240 lb  Weight (kg) 102 kg 102.059 kg 108.863 kg     Body mass index is 29.67 kg/m.  Exam per Dr. Debara Pickett  Relevant CV Studies: Echo 2017 Study Conclusions  - Left ventricle: The cavity size was normal. Wall thickness was   normal. The estimated ejection fraction was 55%. Although no   diagnostic regional wall motion abnormality was identified, this   possibility cannot be completely excluded on the basis of this   study. - Aortic valve: Moderately calcified annulus. Trileaflet; mildly   calcified leaflets. Cusp separation was reduced. Aortic valve was   not completely interrogated. There looks to be mild aortic   stenosis. Valve area by planimetry 1.8 cm^2. - Mitral valve: Calcified annulus. Mildly thickened, mildly   calcified leaflets . There was mild regurgitation. - Left atrium: The atrium was moderately dilated. - Right atrium: The atrium was mildly dilated. Central venous   pressure (est): 3 mm Hg. - Atrial septum: No defect or patent foramen ovale was identified. - Tricuspid valve: There was trivial regurgitation. - Pulmonary arteries: PA peak pressure: 30 mm Hg (S). - Pericardium, extracardiac: There was no pericardial effusion.  Impressions:  - Normal LV wall thickness with LVEF approximately 55%.    Indeterminate diastolic function in the setting of atrial   fibrillation. Moderate left atrial enlargement. Calcified mitral   annulus with thickened mitral leaflets and mild mitral   regurgitation. Probable mild calcific aortic stenosis based on   limited interrogation. Trivial tricuspid regurgitation with PASP   30 mmHg. Mild right atrial enlargement.  Nuc study Study Highlights    There was no ST segment deviation noted during stress.  Defect 1: There is a small defect of mild severity present in the basal inferolateral and mid inferolateral location.  This is a low risk study.   Low risk stress nuclear study with a small fixed inferolateral defect. Ischemia is not seen. Since the study could not be gated, it is not possible to distinguish a true perfusion abnormality from a possible attenuation artifact. Compared to 2013, the inferolateral perfusion abnormality is new.     Laboratory Data:  High Sensitivity Troponin:   Recent Labs  Lab 08/13/18 1730 08/16/18 0200 08/16/18 0414  TROPONINIHS 7 7 8      Cardiac EnzymesNo results for input(s): TROPONINI in the last 168 hours. No results for input(s): TROPIPOC in the last 168 hours.  Chemistry Recent Labs  Lab 08/13/18 1730 08/16/18 0200 08/17/18 0855  NA 137 137 136  K  4.1 4.5 3.9  CL 101 101 103  CO2 28 25 27   GLUCOSE 89 107* 93  BUN 10 12 11   CREATININE 0.83 1.02 0.95  CALCIUM 8.7* 9.0 8.6*  GFRNONAA >60 >60 >60  GFRAA >60 >60 >60  ANIONGAP 8 11 6     No results for input(s): PROT, ALBUMIN, AST, ALT, ALKPHOS, BILITOT in the last 168 hours. Hematology Recent Labs  Lab 08/13/18 1730 08/16/18 0200  WBC 11.3* 9.1  RBC 5.08 4.58  HGB 15.3 14.3  HCT 47.2 42.8  MCV 92.9 93.4  MCH 30.1 31.2  MCHC 32.4 33.4  RDW 12.3 12.2  PLT 124* 122*   BNPNo results for input(s): BNP, PROBNP in the last 168 hours.  DDimer  Recent Labs  Lab 08/13/18 1730  DDIMER 0.37     Radiology/Studies:  Dg Chest 2 View   Result Date: 08/16/2018 CLINICAL DATA:  73 year old male with chest pain and shortness of breath. EXAM: CHEST - 2 VIEW COMPARISON:  Chest radiograph dated 08/13/2018 FINDINGS: Diffuse bilateral interstitial and subpleural densities similar to prior radiograph likely chronic and fibrosis. No new consolidative changes. There is no pleural effusion or pneumothorax. Stable cardiac silhouette. No acute osseous pathology. IMPRESSION: No active cardiopulmonary disease.  No interval change. Electronically Signed   By: Anner Crete M.D.   On: 08/16/2018 02:21   Dg Chest Portable 1 View  Result Date: 08/13/2018 CLINICAL DATA:  Chest pain EXAM: PORTABLE CHEST 1 VIEW COMPARISON:  05/26/2016, 04/21/2016 FINDINGS: Low lung volumes. Bilateral pulmonary fibrosis, grossly unchanged allowing for portable technique. Mild cardiomegaly. No pneumothorax. IMPRESSION: Low lung volumes with bilateral fibrosis. No definite acute superimposed opacity. Mild cardiomegaly Electronically Signed   By: Donavan Foil M.D.   On: 08/13/2018 18:21    Assessment and Plan:   Chest pain, prob pericarditis with neg Troponin.  Placed on colchicine last night and ibuprofen 600 mg TID Dr. Debara Pickett to see.   Atrial fib with ablation 02/2018 and repeat 07/17/18 now maintaining SR  On eliquis for PAF.on cartia   Pulmonary fibrosis per primary      For questions or updates, please contact [place] Please consult [email or web address] for contact info under     Signed, Cecilie Kicks, NP  08/17/2018 10:59 AM

## 2018-08-17 NOTE — Progress Notes (Signed)
Pt requesting ibuprofen for pain.  Pt states he had in ED and it relieved pain.  [facility] NP texted via [place] to request.  Will cont to monitor pt closely.

## 2018-08-17 NOTE — TOC Benefit Eligibility Note (Signed)
Transition of Care [facility]) Benefit Eligibility Note    Patient Details  Name: Curtis [name] MRN: [id number] Date of Birth: April 07, 1945   Medication/Dose: COLCHICINE  0.6 MG BID  Covered?: Yes  Tier: 2 Drug  Prescription Coverage Preferred Pharmacy: [facility]  Roseanne Kaufman with Person/Company/Phone Number:: [facility]   @  [employer] # [phone]  OPT- 2  Co-Pay: $ 15.00  Prior Approval: No  Deductible: Met       Memory Argue Phone Number: 08/17/2018, 1:04 PM

## 2018-08-17 NOTE — Discharge Instructions (Signed)
Pericarditis  Pericarditis is inflammation of the pericardium. The pericardium is a thin, double-layered, fluid-filled sac that surrounds your heart. The pericardium protects and holds your heart in your chest cavity. Inflammation of your pericardium can cause rubbing (friction) between the two layers when your heart beats. Fluid may also build up between the layers of the sac (pericardial effusion). There are three types of this condition:  Acute pericarditis. Inflammation develops suddenly and causes pericardial effusion.  Chronic pericarditis. Inflammation may develop slowly, or it may continue after acute pericarditis and last longer than 6 months.  Constrictive pericarditis (rare). The layers of the pericardium stiffen and develop scar tissue. The scar tissue thickens and sticks together. This makes it difficult for the heart to work in a normal way. In most cases, pericarditis is acute and not serious. Chronic pericarditis and constrictive pericarditis are more serious and may require treatment. What are the causes? In most cases, the cause of this condition is not known. If a cause is found, it may be:  An infection from a virus.  A heart attack (myocardial infarction).  Problems after open-heart surgery.  Chest injury.  Autoimmune disorder. The body's defense system (immune system) attacks healthy tissues. Examples include lupus or rheumatoid arthritis.  Kidney failure.  Underactive thyroid gland (hypothyroidism).  Cancer that has spread (metastasized) to the pericardium from another part of the body.  Treatment using high-energy X-rays (radiation).  Certain medicines, including some seizure medicines, blood thinners, heart medicines, and antibiotics.  Infection from a fungus or bacteria (rare). What increases the risk? The following factors may make you more likely to develop this condition:  Being male.  Being 20-50 years old.  Having had pericarditis  before.  Having had a recent upper respiratory tract infection. What are the signs or symptoms? The main symptom of this condition is chest pain. The chest pain may:  Be in the center or the left side of your chest.  Not go away with rest.  Last for many hours or days.  Worsen when you lie down and get better when you sit up and lean forward.  Worsen when you swallow.  Move to your back, neck, or shoulder. Other symptoms may include:  A chronic, dry cough.  Heart palpitations. These may feel like rapid, fluttering, or pounding heartbeats.  Dizziness or fainting.  Tiredness or fatigue.  Fever.  Rapid breathing.  Shortness of breath when lying down. How is this diagnosed? This condition is diagnosed based on medical history, physical exam, and diagnostic tests. During your physical exam, your health care provider will listen for friction while your heart beats (pericardial rub). You may also have tests, including:  Blood tests to look for signs of infection and inflammation.  Electrocardiogram (ECG). This test measures the electrical activity in your heart.  Echocardiogram. This uses sound waves to make images of your heart.  CT scan.  MRI.  Culture of pericardial fluid.  Removing a tissue sample of the pericardium to look at under a microscope (biopsy). If tests show that you may have constrictive pericarditis, a small, thin tube may be inserted into your heart to confirm the diagnosis (cardiac catheterization). How is this treated? Treatment for this condition depends on the cause and type of pericarditis that you have. In most cases, acute pericarditis will clear up on its own. Treatment may include:  Limiting physical activity.  Medicines, such as: ? NSAIDs to relieve pain and inflammation. ? Steroids to reduce inflammation. ? Colchicine to relieve pain   and inflammation.  A procedure to remove fluid using a needle (pericardiocentesis) if fluid buildup puts  pressure on the heart.  Surgery to remove part of the pericardium if constrictive pericarditis develops. If another condition is causing your pericarditis, you may also need treatment for that condition. Follow these instructions at home:  Limit your activity as told by your health care provider until your symptoms improve. This usually includes: ? Resting or sitting for most of the day. ? No long walks. ? No exercise. ? No sports.  Athletes may need to limit competition for several months.  Do not use any products that contain nicotine or tobacco, such as cigarettes and e-cigarettes. If you need help quitting, ask your health care provider.  Eat a heart-healthy diet. Ask your dietitian what foods are healthy for your heart.  Take over-the-counter and prescription medicines only as told by your health care provider. ? If you were prescribed an antibiotic medicine, take it as told by your health care provider. Do not stop taking the antibiotic even if you start to feel better.  Keep all follow-up visits as told by your health care provider. This is important. Contact a health care provider if:  You continue to have symptoms of pericarditis.  You develop new symptoms of pericarditis.  Your symptoms get worse.  You have a fever. Get help right away if:  You have worsening chest pain.  You have difficulty breathing.  You pass out. These symptoms may represent a serious problem that is an emergency. Do not wait to see if the symptoms will go away. Get medical help right away. Call your local emergency services (911 in the [place] Do not drive yourself to the hospital. Summary  Pericarditis is inflammation of the pericardium.  The pericardium is a thin, double-layered, fluid-filled sac that surrounds your heart.  The main symptom of this condition is chest pain.  Treatment for this condition depends on the cause and type of pericarditis that you have. This information is not  intended to replace advice given to you by your health care provider. Make sure you discuss any questions you have with your health care provider. Document Released: 06/19/2000 Document Revised: 01/30/2017 Document Reviewed: 01/30/2017 Elsevier Patient Education  2020 [employer].

## 2018-08-17 NOTE — Progress Notes (Signed)
Discharge order written. Instructions reviewed with wife and given to patient. All questions answered. Meds brought up by TOC. Pt discharged via wheelchair in stable condition.

## 2018-09-02 DIAGNOSIS — Z01818 Encounter for other preprocedural examination: Secondary | ICD-10-CM | POA: Diagnosis not present

## 2018-09-02 DIAGNOSIS — H27132 Posterior dislocation of lens, left eye: Secondary | ICD-10-CM | POA: Diagnosis not present

## 2018-09-02 DIAGNOSIS — Z9841 Cataract extraction status, right eye: Secondary | ICD-10-CM | POA: Diagnosis not present

## 2018-09-02 DIAGNOSIS — Z9842 Cataract extraction status, left eye: Secondary | ICD-10-CM | POA: Diagnosis not present

## 2018-09-04 DIAGNOSIS — Z01812 Encounter for preprocedural laboratory examination: Secondary | ICD-10-CM | POA: Diagnosis not present

## 2018-09-04 DIAGNOSIS — H27132 Posterior dislocation of lens, left eye: Secondary | ICD-10-CM | POA: Diagnosis not present

## 2018-09-04 DIAGNOSIS — Z20828 Contact with and (suspected) exposure to other viral communicable diseases: Secondary | ICD-10-CM | POA: Diagnosis not present

## 2018-09-11 DIAGNOSIS — H27132 Posterior dislocation of lens, left eye: Secondary | ICD-10-CM | POA: Diagnosis not present

## 2018-09-11 DIAGNOSIS — H4302 Vitreous prolapse, left eye: Secondary | ICD-10-CM | POA: Diagnosis not present

## 2018-09-21 DIAGNOSIS — H401434 Capsular glaucoma with pseudoexfoliation of lens, bilateral, indeterminate stage: Secondary | ICD-10-CM | POA: Diagnosis not present

## 2018-09-21 DIAGNOSIS — Z9889 Other specified postprocedural states: Secondary | ICD-10-CM | POA: Diagnosis not present

## 2018-09-21 DIAGNOSIS — Z7952 Long term (current) use of systemic steroids: Secondary | ICD-10-CM | POA: Diagnosis not present

## 2018-09-21 DIAGNOSIS — Z79899 Other long term (current) drug therapy: Secondary | ICD-10-CM | POA: Diagnosis not present

## 2018-09-21 DIAGNOSIS — H27132 Posterior dislocation of lens, left eye: Secondary | ICD-10-CM | POA: Diagnosis not present

## 2018-09-28 IMAGING — DX DG CHEST 2V
2 series · 2 of 2 positions shown · non-contrast
Comparison: Chest radiograph from 10/24/2011

CLINICAL DATA: Acute onset left-sided chest pain, shortness of
breath and nausea. Initial encounter.

EXAM:
CHEST  2 VIEW

[chest pa]
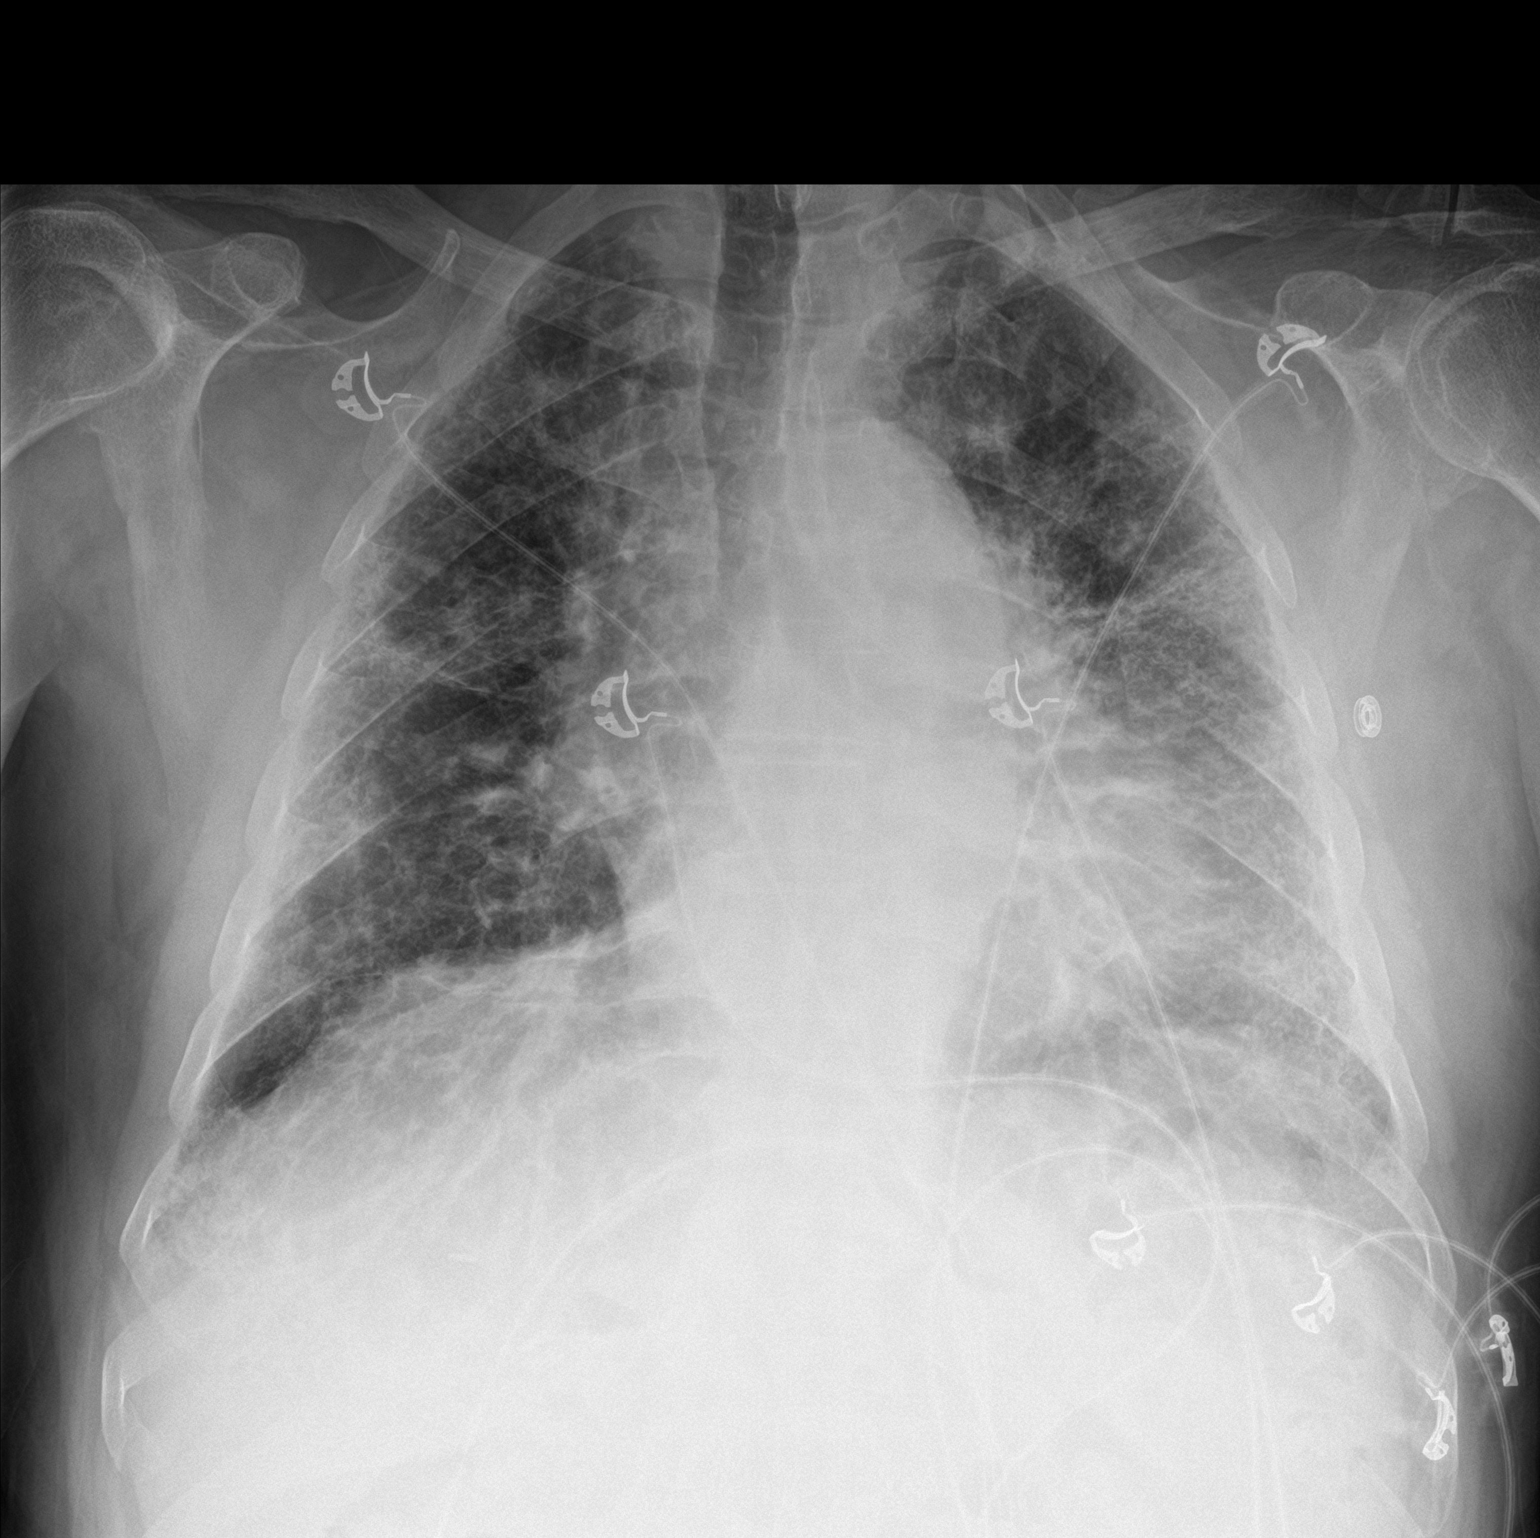

[chest lat]
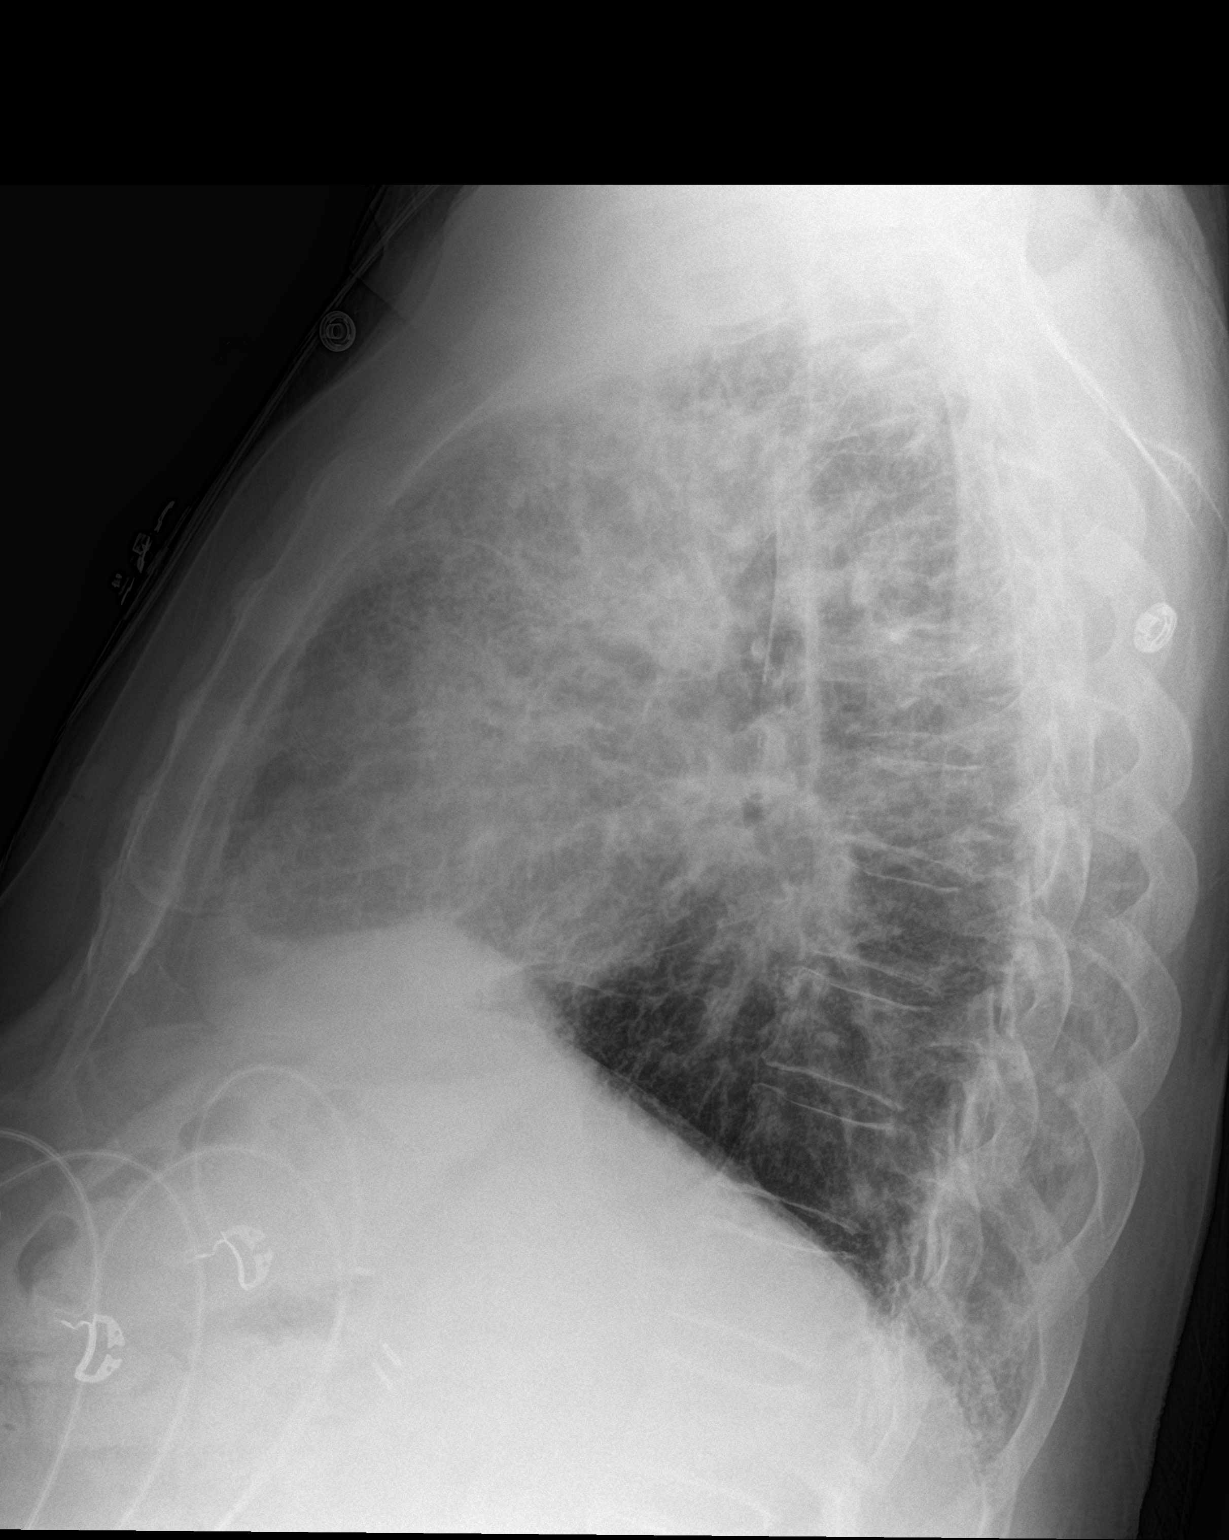

[2 of 2 positions shown; findings below may reference images not displayed]

FINDINGS: The lungs are well-aerated. Bilateral peripheral fibrotic change is
noted, with patchy bilateral interstitial opacities, concerning for
interstitial lung disease. There is no evidence of pleural effusion
or pneumothorax.

The heart is normal in size; the mediastinal contour is within
normal limits. No acute osseous abnormalities are seen. Clips are
noted within the right upper quadrant, reflecting prior
cholecystectomy.
IMPRESSION: Bilateral peripheral fibrotic change, with patchy bilateral
interstitial opacities, concerning for interstitial lung disease.
High-resolution CT is recommended for further evaluation.

## 2018-10-19 DIAGNOSIS — H401434 Capsular glaucoma with pseudoexfoliation of lens, bilateral, indeterminate stage: Secondary | ICD-10-CM | POA: Diagnosis not present

## 2018-10-20 ENCOUNTER — Encounter: Payer: Self-pay | Admitting: Cardiology

## 2018-10-20 ENCOUNTER — Other Ambulatory Visit: Payer: Self-pay

## 2018-10-20 ENCOUNTER — Ambulatory Visit (INDEPENDENT_AMBULATORY_CARE_PROVIDER_SITE_OTHER): Payer: PPO | Admitting: Cardiology

## 2018-10-20 VITALS — BP 116/72 | HR 70 | Ht 73.0 in | Wt 228.0 lb

## 2018-10-20 DIAGNOSIS — I4819 Other persistent atrial fibrillation: Secondary | ICD-10-CM

## 2018-10-20 NOTE — Patient Instructions (Addendum)
Medication Instructions:  Your physician recommends that you continue on your current medications as directed. Please refer to the Current Medication list given to you today.  * If you need a refill on your cardiac medications before your next appointment, please call your pharmacy.   Labwork: None ordered  Testing/Procedures: None ordered  Follow-Up: You are scheduled for follow up with Dr. Curt Bears on 01/22/19 @ 9:30 am  Thank you for choosing [facility]!!   Trinidad Curet, RN [phone]

## 2018-10-20 NOTE — Progress Notes (Signed)
Electrophysiology Office Note   Date:  10/20/2018   ID:  Curtis [name], DOB [birth year], MRN [id number]  PCP:  Kathyrn Drown, MD  Cardiologist:  Johnsie Cancel Primary Electrophysiologist:  Dr Curt Bears    CC: Follow up for atrial fibrillation   History of Present Illness: Curtis [name] is a 73 y.o. male who is being seen today for the evaluation of atrial fibillation at the request of Curtis, Elayne Snare, MD. Presenting today for electrophysiology evaluation. He has a history of persistent atrial fibrillation status post Tikosyn loading, hypertension, hyperlipidemia, COPD and interstitial lung disease. His Tikosyn load was on 02/22/16. He was previously havin issues with fatigue, weakness, anorexia which he thought was due to his potassium, magnesium, and Tikosyn and these medications were stopped.  He is now status post AF ablation 02/18/2018 with repeat ablation 07/17/2018.  Today, denies symptoms of palpitations, chest pain, shortness of breath, orthopnea, PND, lower extremity edema, claudication, dizziness, presyncope, syncope, bleeding, or neurologic sequela. The patient is tolerating medications without difficulties. He had noted no further episodes of AF. He did present to the hospital post ablation with pericarditis and was prescribed colchicine and ibuprofen. This greatly improved his symptoms.   Past Medical History:  Diagnosis Date  . Arthritis   . Asthma   . Atrial fibrillation ([place])   . Cataract   . Cervical spondylosis   . Chest pain    Emergency room October 24, 2011, no MI  //   Nuclear, October, 2013, adenosine, EF 61%, no scar or ischemia  . Dyslipidemia    Statin intolerant  . Dyspnea   . GERD (gastroesophageal reflux disease)   . Glaucoma   . Hearing loss   . Hypertension   . Hypertension   . Idiopathic pulmonary fibrosis ([place])   . Interstitial pulmonary fibrosis ([place]) 2012  . Left shoulder pain 2011  . Myocardial infarction ([place])   . Psoriasis   .  Pulmonary fibrosis ([place])   . Statin intolerance    Past Surgical History:  Procedure Laterality Date  . ATRIAL FIBRILLATION ABLATION N/A 02/18/2018   Procedure: ATRIAL FIBRILLATION ABLATION;  Surgeon: Constance Haw, MD;  Location: [facility];  Service: Cardiovascular;  Laterality: N/A;  . ATRIAL FIBRILLATION ABLATION N/A 07/17/2018   Procedure: ATRIAL FIBRILLATION ABLATION;  Surgeon: Constance Haw, MD;  Location: [facility];  Service: Cardiovascular;  Laterality: N/A;  . BRAIN SURGERY    . CARDIOVERSION N/A 12/13/2015   Procedure: CARDIOVERSION;  Surgeon: Josue Hector, MD;  Location: [facility];  Service: Cardiovascular;  Laterality: N/A;  . CATARACT EXTRACTION W/PHACO Right 06/11/2012   Procedure: CATARACT EXTRACTION PHACO AND INTRAOCULAR LENS PLACEMENT ([place]);  Surgeon: Tonny Branch, MD;  Location: [facility];  Service: Ophthalmology;  Laterality: Right;  CDE:  14.43  . CATARACT EXTRACTION W/PHACO Left 06/29/2012   Procedure: CATARACT EXTRACTION PHACO AND INTRAOCULAR LENS PLACEMENT ([facility]);  Surgeon: Tonny Branch, MD;  Location: [facility];  Service: Ophthalmology;  Laterality: Left;  CDE:14.11  . CHOLECYSTECTOMY  05/08/2012  . CHOLECYSTECTOMY N/A 05/08/2012   Procedure: LAPAROSCOPIC CHOLECYSTECTOMY;  Surgeon: Harl Bowie, MD;  Location: [facility];  Service: General;  Laterality: N/A;  . COLONOSCOPY    . LIVER BIOPSY N/A 05/08/2012   Procedure: LIVER BIOPSY;  Surgeon: Harl Bowie, MD;  Location: [facility];  Service: General;  Laterality: N/A;  . NECK SURGERY     cervical disc with cliip  . SHOULDER ARTHROSCOPY WITH DISTAL CLAVICLE RESECTION Right 12/12/2016  Procedure: RIGHT SHOULDER ARTHROSCOPY, SUBACROMIAL DECOMPRESSION, DISTAL CLAVICLE RESECTION, WITH POSSIBLE MINI OPEN ROTATOR CUFF REPAIR;  Surgeon: [name], MD;  Location: [facility];  Service: Orthopedics;  Laterality: Right;     Current Outpatient Medications  Medication Sig Dispense Refill  . albuterol  (PROVENTIL HFA;VENTOLIN HFA) 108 (90 Base) MCG/ACT inhaler Inhale 2 puffs into the lungs every 6 (six) hours as needed for wheezing. 1 Inhaler 2  . ALPRAZolam (XANAX) 0.5 MG tablet Take 0.5-1 tablets (0.25-0.5 mg total) by mouth 2 (two) times daily as needed for anxiety or sleep. 20 tablet 2  . apixaban (ELIQUIS) 5 MG TABS tablet Take 1 tablet (5 mg total) by mouth 2 (two) times daily. 180 tablet 1  . brimonidine (ALPHAGAN) 0.2 % ophthalmic solution Place 1 drop into both eyes 2 (two) times daily.    . calcium carbonate (TUMS - DOSED IN MG ELEMENTAL CALCIUM) 500 MG chewable tablet Chew 1 tablet by mouth daily as needed for indigestion or heartburn.    . colchicine 0.6 MG tablet Take 1 tablet (0.6 mg total) by mouth 2 (two) times daily. 180 tablet 0  . diltiazem (CARTIA XT) 120 MG 24 hr capsule TAKE 1 CAPSULE(120 MG) BY MOUTH DAILY (Patient taking differently: Take 120 mg by mouth daily. ) 30 capsule 5  . fexofenadine (ALLEGRA) 180 MG tablet Take 180 mg by mouth daily as needed for allergies or rhinitis.    . fluticasone (FLONASE) 50 MCG/ACT nasal spray Place 2 sprays into both nostrils daily. (Patient taking differently: Place 1 spray into both nostrils daily. ) 16 g 5  . HYDROcodone-acetaminophen (NORCO/VICODIN) 5-325 MG tablet Take 1 tablet by mouth 4 (four) times daily as needed for moderate pain. 120 tablet 0  . latanoprost (XALATAN) 0.005 % ophthalmic solution Place 1 drop into the left eye at bedtime.     . OXYGEN Inhale 2.5-2.75 L into the lungs as needed (for shortness of breath).     . pantoprazole (PROTONIX) 40 MG tablet Take 1 tablet (40 mg total) by mouth daily at 6 (six) AM. 90 tablet 0   No current facility-administered medications for this visit.     Allergies:   Pirfenidone, Acyclovir and related, Oxycodone, Pravastatin, Simvastatin, Prednisone, and Tegretol [carbamazepine]   Social History:  The patient  reports that he quit smoking about 15 years ago. His smoking use included  cigarettes. He has a 26.00 pack-year smoking history. He has never used smokeless tobacco. He reports that he does not drink alcohol or use drugs.   Family History:  The patient's family history includes Breast cancer in his sister; COPD in his mother; Congestive Heart Failure in his mother; Hypertension in his mother; Stroke in his father.   ROS:  Please see the history of present illness.   Otherwise, review of systems is positive for none.   All other systems are reviewed and negative.   PHYSICAL EXAM: VS:  BP 116/72   Pulse 70   Ht 6\' 1"  (1.854 m)   Wt 228 lb (103.4 kg)   SpO2 95%   BMI 30.08 kg/m  , BMI Body mass index is 30.08 kg/m. GEN: Well nourished, well developed, in no acute distress  HEENT: normal  Neck: no JVD, carotid bruits, or masses Cardiac: RRR; no murmurs, rubs, or gallops,no edema  Respiratory:  clear to auscultation bilaterally, normal work of breathing GI: soft, nontender, nondistended, + BS MS: no deformity or atrophy  Skin: warm and dry Neuro:  Strength and sensation  are intact Psych: euthymic mood, full affect  EKG:  EKG is ordered today. Personal review of the ekg ordered shows sinus rhythm, PACs, rate 70  Recent Labs: 08/17/2018: BUN 11; Creatinine, Ser 0.95; Hemoglobin 13.0; Platelets 106; Potassium 3.9; Sodium 136    Lipid Panel     Component Value Date/Time   CHOL 119 08/16/2018 0414   CHOL 123 07/30/2017 0957   TRIG 84 08/16/2018 0414   HDL 23 (L) 08/16/2018 0414   HDL 22 (L) 07/30/2017 0957   CHOLHDL 5.2 08/16/2018 0414   VLDL 17 08/16/2018 0414   LDLCALC 79 08/16/2018 0414   LDLCALC 77 07/30/2017 0957     Wt Readings from Last 3 Encounters:  10/20/18 228 lb (103.4 kg)  08/17/18 224 lb 13.9 oz (102 kg)  08/13/18 240 lb (108.9 kg)      Other studies Reviewed: Additional studies/ records that were reviewed today include: TTE 08/17/18 Review of the above records today demonstrates:   1. The left ventricle has normal systolic  function with an ejection fraction of 60-65%. The cavity size was normal. There is mildly increased left ventricular wall thickness. Left ventricular diastolic Doppler parameters are consistent with  pseudonormalization.  2. Left atrial size was mild-moderately dilated.  3. There is mild mitral annular calcification present. No evidence of mitral valve stenosis. Trivial mitral regurgitation.  4. The aortic valve is tricuspid. Moderate calcification of the aortic valve. Mild stenosis of the aortic valve. Mean gradient 13 mmHg.  5. The aorta is normal in size and structure.  6. The right ventricle has normal systolic function. The cavity was normal. There is no increase in right ventricular wall thickness.  7. Normal IVC size. PA systolic pressure 32 mmHg.   SPECT 02/05/16  There was no ST segment deviation noted during stress.  Defect 1: There is a small defect of mild severity present in the basal inferolateral and mid inferolateral location.  This is a low risk study.   Low risk stress nuclear study with a small fixed inferolateral defect. Ischemia is not seen.  ASSESSMENT AND PLAN:  1.  Persistent atrial fibrillation: Currently on Eliquis.  Status post ablation on 12/19/2018 with repeat ablation 07/17/2018.  Remains in sinus rhythm. No changes  This patients CHA2DS2-VASc Score and unadjusted Ischemic Stroke Rate (% per year) is equal to 2.2 % stroke rate/year from a score of 2  Above score calculated as 1 point each if present [CHF, HTN, DM, Vascular=MI/PAD/Aortic Plaque, Age if 65-74, or Male] Above score calculated as 2 points each if present [Age > 75, or Stroke/TIA/TE]  2. Hypertension: well controlled  3.  Coronary artery disease: Noted on CT scan. No current chest pain.  Current medicines are reviewed at length with the patient today.   The patient does not have concerns regarding his medicines.  The following changes were made today: none  Labs/ tests ordered today  include:  Orders Placed This Encounter  Procedures  . EKG 12-Lead     Disposition:   FU with Deral Schellenberg in 3 months  Signed, Keasia Dubose Meredith Leeds, MD  10/20/2018 1:56 PM     [address] [place] [address] [phone] (office) [phone] (fax)

## 2018-10-21 ENCOUNTER — Ambulatory Visit (INDEPENDENT_AMBULATORY_CARE_PROVIDER_SITE_OTHER): Payer: PPO | Admitting: Family Medicine

## 2018-10-21 ENCOUNTER — Encounter: Payer: Self-pay | Admitting: Family Medicine

## 2018-10-21 VITALS — BP 130/90 | Temp 97.1°F | Wt 232.8 lb

## 2018-10-21 DIAGNOSIS — Z23 Encounter for immunization: Secondary | ICD-10-CM | POA: Diagnosis not present

## 2018-10-21 DIAGNOSIS — M542 Cervicalgia: Secondary | ICD-10-CM | POA: Diagnosis not present

## 2018-10-21 DIAGNOSIS — M545 Low back pain, unspecified: Secondary | ICD-10-CM

## 2018-10-21 DIAGNOSIS — Z1211 Encounter for screening for malignant neoplasm of colon: Secondary | ICD-10-CM

## 2018-10-21 DIAGNOSIS — G8929 Other chronic pain: Secondary | ICD-10-CM | POA: Diagnosis not present

## 2018-10-21 MED ORDER — HYDROCODONE-ACETAMINOPHEN 5-325 MG PO TABS: 1.0000 | ORAL_TABLET | Freq: Four times a day (QID) | ORAL | 0 refills | Status: AC | PRN

## 2018-10-21 MED ORDER — HYDROCODONE-ACETAMINOPHEN 5-325 MG PO TABS: ORAL_TABLET | ORAL | 0 refills | Status: AC

## 2018-10-21 NOTE — Progress Notes (Signed)
Referral placed.

## 2018-10-21 NOTE — Addendum Note (Signed)
Addended by: Vicente Males on: 10/21/2018 01:11 PM   Modules accepted: Orders

## 2018-10-21 NOTE — Progress Notes (Signed)
   Subjective:    Patient ID: Curtis [name], male    DOB: [birth year], 73 y.o.   MRN: [id number]  Hypertension This is a chronic problem. Pertinent negatives include no chest pain, headaches or shortness of breath. There are no compliance problems.   pt states he is doing well with everything.  This patient was seen today for chronic pain  The medication list was reviewed and updated.   -Compliance with medication: Takes medication daily  - Number patient states they take daily: Anywhere between 3 and 4 daily  -when was the last dose patient took?  Earlier today  The patient was advised the importance of maintaining medication and not using illegal substances with these.  Here for refills and follow up  The patient was educated that we can provide 3 monthly scripts for their medication, it is their responsibility to follow the instructions.  Side effects or complications from medications: Does not have any side effects  Patient is aware that pain medications are meant to minimize the severity of the pain to allow their pain levels to improve to allow for better function. They are aware of that pain medications cannot totally remove their pain.  Due for UDT ( at least once per year) : On a yearly basis Patient does state that the pain medicine does help him function he denies abusing it without it he would have a hard time getting through the day because of his cervical pain and lower back pain       Review of Systems  Constitutional: Negative for activity change.  HENT: Negative for congestion and rhinorrhea.   Respiratory: Negative for cough and shortness of breath.   Cardiovascular: Negative for chest pain.  Gastrointestinal: Negative for abdominal pain, diarrhea, nausea and vomiting.  Genitourinary: Negative for dysuria and hematuria.  Neurological: Negative for weakness and headaches.  Psychiatric/Behavioral: Negative for behavioral problems and confusion.   He  does relate neck pain and lower back pain no significant sciatica currently    Objective:   Physical Exam Vitals signs reviewed.  Cardiovascular:     Rate and Rhythm: Normal rate and regular rhythm.     Heart sounds: Normal heart sounds. No murmur.  Pulmonary:     Effort: Pulmonary effort is normal.     Breath sounds: Normal breath sounds.  Lymphadenopathy:     Cervical: No cervical adenopathy.  Neurological:     Mental Status: He is alert.  Psychiatric:        Behavior: Behavior normal.           Assessment & Plan:  The patient was seen in followup for chronic pain. A review over at their current pain status was discussed. Drug registry was checked. Prescriptions were given. Discussion was held regarding the importance of compliance with medication as well as pain medication contract.  Time for questions regarding pain management plan occurred. Importance of regular followup visits was discussed. Patient was informed that medication may cause drowsiness and should not be combined  with other medications/alcohol or street drugs. Patient was cautioned that medication could cause drowsiness. If the patient feels medication is causing altered alertness then do not drive or operate dangerous equipment.  Drug registry checked 3 prescription sent in Patient stable on all other health issues  Referral for colonoscopy patient states that he is due equal colonoscopy GI

## 2018-11-02 IMAGING — CR DG CHEST 2V
2 series · 2 of 2 positions shown · non-contrast
Comparison: Radiograph 04/21/2016, lung bases from CT abdomen
04/22/2016

CLINICAL DATA: Central chest pain.  Shortness of breath.

EXAM:
CHEST  2 VIEW

[chest pa]
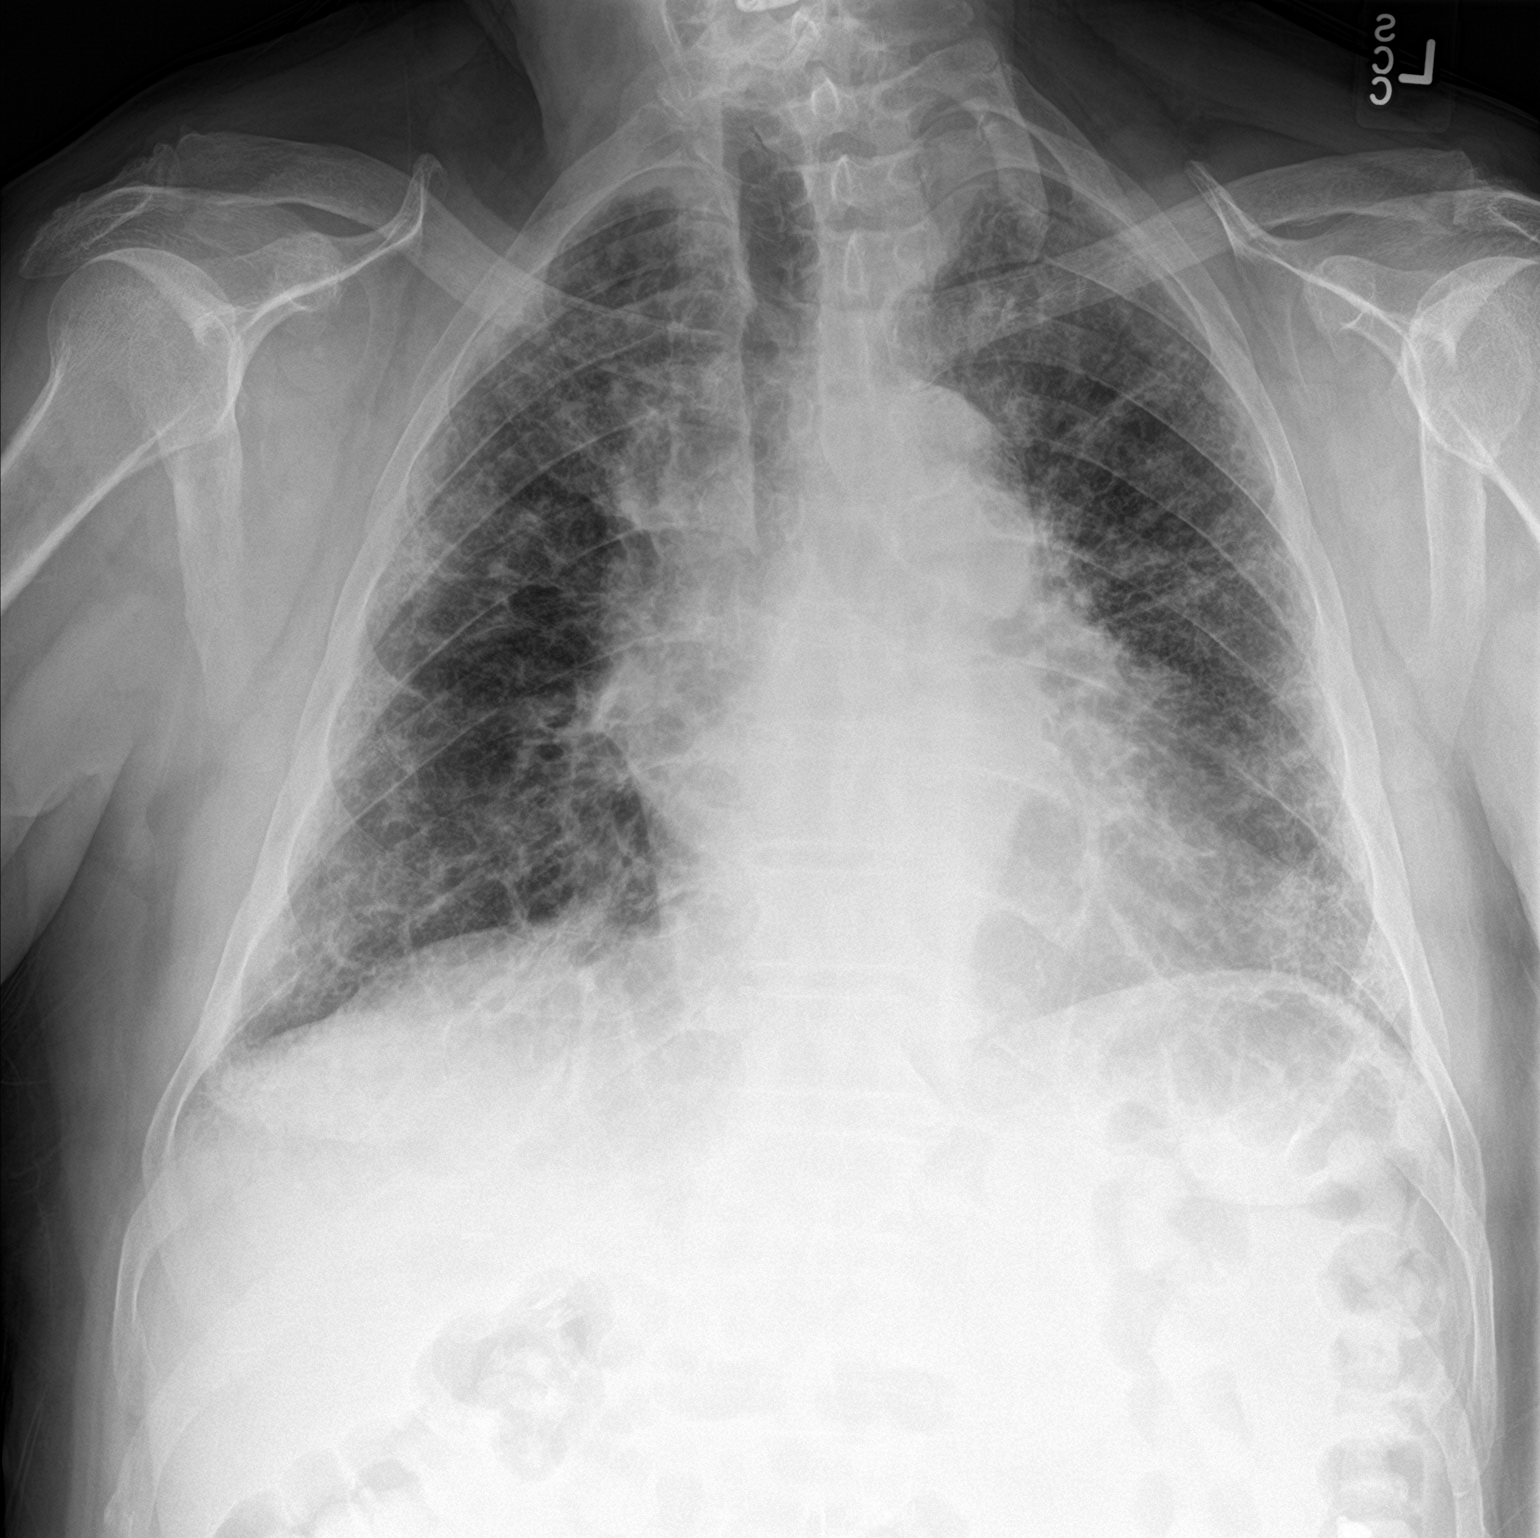

[chest lat]
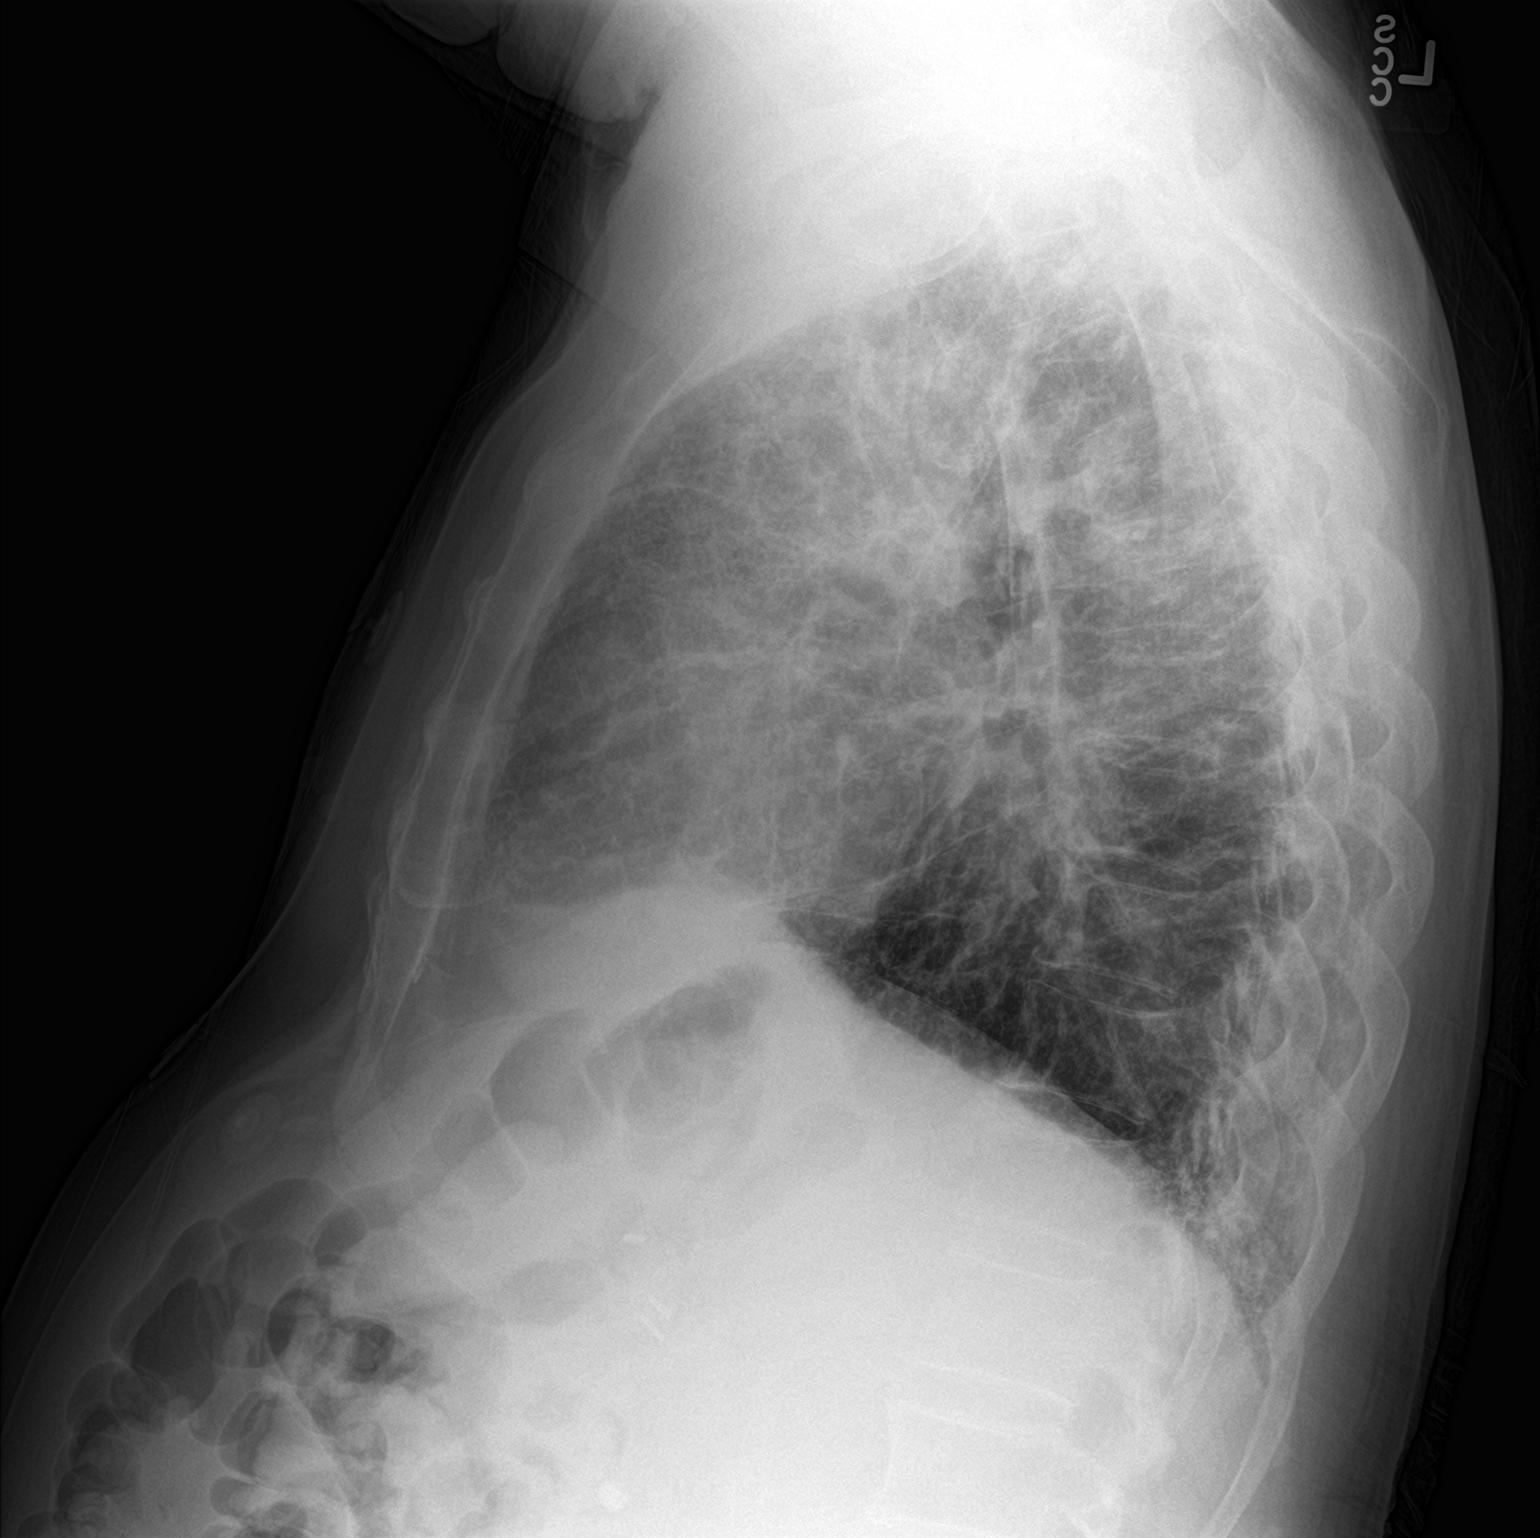

[2 of 2 positions shown; findings below may reference images not displayed]

FINDINGS: Stable heart size and mediastinal contours, borderline mild
cardiomegaly. Peripheral distribution of reticulation and
honeycombing, most prominent at the lung bases, suggesting
interstitial lung disease. This appears similar to prior exam. No
evidence of superimposed pulmonary edema, focal airspace disease,
pleural fluid or pneumothorax. Osseous structures are stable.
IMPRESSION: Stable pattern of reticulation and probable honeycombing suggesting
interstitial lung disease, probable IPF. No evidence of superimposed
acute abnormality. High-resolution chest CT could be considered for
interstitial lung disease characterization, on an elective basis.

## 2018-11-06 ENCOUNTER — Ambulatory Visit (INDEPENDENT_AMBULATORY_CARE_PROVIDER_SITE_OTHER): Payer: PPO | Admitting: Family Medicine

## 2018-11-06 DIAGNOSIS — J329 Chronic sinusitis, unspecified: Secondary | ICD-10-CM

## 2018-11-06 DIAGNOSIS — J8410 Pulmonary fibrosis, unspecified: Secondary | ICD-10-CM | POA: Diagnosis not present

## 2018-11-06 DIAGNOSIS — J310 Chronic rhinitis: Secondary | ICD-10-CM

## 2018-11-06 MED ORDER — PREDNISONE 10 MG PO TABS: ORAL_TABLET | ORAL | 0 refills | Status: AC

## 2018-11-06 MED ORDER — LORAZEPAM 0.5 MG PO TABS: ORAL_TABLET | ORAL | 0 refills | Status: AC

## 2018-11-06 NOTE — Progress Notes (Signed)
   Subjective:    Patient ID: Curtis [name], male    DOB: 04-Nov-1945, 73 y.o.   MRN: [id number]  HPI Pt is having congestion, right ear pain, and lungs are bothering him really bad. Stopped hurting last night for a while and then this morning all the pain started. Pt had to wear oxygen last night. Pt also states he is having back pain that is going to his neck. Pt states that Dr.Scott was going to contact his lung doctor because he has not been seen in their office in over a year.  Virtual Visit via Telephone Note  I connected with Curtis [name] on 11/06/18 at  1:10 PM EDT by telephone and verified that I am speaking with the correct person using two identifiers.  Location: Patient: home Provider: office   I discussed the limitations, risks, security and privacy concerns of performing an evaluation and management service by telephone and the availability of in person appointments. I also discussed with the patient that there may be a patient responsible charge related to this service. The patient expressed understanding and agreed to proceed.   History of Present Illness:    Observations/Objective:   Assessment and Plan:   Follow Up Instructions:    I discussed the assessment and treatment plan with the patient. The patient was provided an opportunity to ask questions and all were answered. The patient agreed with the plan and demonstrated an understanding of the instructions.   The patient was advised to call back or seek an in-person evaluation if the symptoms worsen or if the condition fails to improve as anticipated.  I provided 20 minutes of non-face-to-face time during this encounter.   Vicente Males, LPN  patient had sinus congestion.  Headache.  Frontal in nature.  Ear discomfort.  Also some cough.  Adamant he does not have coronavirus.  States he has been tested for this already and he has not been around anyone sick and he certainly does not have it.   Patient also notes that he has not seen his pulmonary fibrosis doctor for a year.  Computer reveals they tried to contact patient 6 months ago apparently without success   Review of Systems  no vomiting no diarrhea no rash    Objective:   Physical Exam   virtual      Assessment & Plan:  Impression rhinosinusitis with element of flare of pulmonary fibrosis.  Has noted fairly adamant not coronavirus.  Advised if he moves in the further symptomatology may well need to be tested.  Antibiotics prescribed.  Referral back to specialist and somehow they did not connect 6 months ago

## 2018-11-08 ENCOUNTER — Encounter: Payer: Self-pay | Admitting: Family Medicine

## 2018-11-15 ENCOUNTER — Other Ambulatory Visit: Payer: Self-pay

## 2018-11-15 ENCOUNTER — Emergency Department (HOSPITAL_COMMUNITY): Payer: PPO

## 2018-11-15 ENCOUNTER — Emergency Department (HOSPITAL_COMMUNITY)
Admission: EM | Admit: 2018-11-15 | Discharge: 2018-11-15 | Disposition: A | Discharge status: Home / Self Care | Payer: PPO | Source: Home / Self Care | Attending: Emergency Medicine | Admitting: Emergency Medicine

## 2018-11-15 ENCOUNTER — Encounter (HOSPITAL_COMMUNITY): Payer: Self-pay | Admitting: Emergency Medicine

## 2018-11-15 DIAGNOSIS — Z4682 Encounter for fitting and adjustment of non-vascular catheter: Secondary | ICD-10-CM | POA: Diagnosis not present

## 2018-11-15 DIAGNOSIS — R579 Shock, unspecified: Secondary | ICD-10-CM | POA: Diagnosis not present

## 2018-11-15 DIAGNOSIS — K2900 Acute gastritis without bleeding: Secondary | ICD-10-CM | POA: Diagnosis not present

## 2018-11-15 DIAGNOSIS — J849 Interstitial pulmonary disease, unspecified: Secondary | ICD-10-CM | POA: Diagnosis not present

## 2018-11-15 DIAGNOSIS — E872 Acidosis: Secondary | ICD-10-CM | POA: Diagnosis present

## 2018-11-15 DIAGNOSIS — J84112 Idiopathic pulmonary fibrosis: Secondary | ICD-10-CM | POA: Diagnosis not present

## 2018-11-15 DIAGNOSIS — K7460 Unspecified cirrhosis of liver: Secondary | ICD-10-CM | POA: Diagnosis not present

## 2018-11-15 DIAGNOSIS — R0902 Hypoxemia: Secondary | ICD-10-CM | POA: Diagnosis present

## 2018-11-15 DIAGNOSIS — Y95 Nosocomial condition: Secondary | ICD-10-CM | POA: Diagnosis present

## 2018-11-15 DIAGNOSIS — J9601 Acute respiratory failure with hypoxia: Secondary | ICD-10-CM | POA: Diagnosis not present

## 2018-11-15 DIAGNOSIS — R0789 Other chest pain: Secondary | ICD-10-CM | POA: Insufficient documentation

## 2018-11-15 DIAGNOSIS — J45909 Unspecified asthma, uncomplicated: Secondary | ICD-10-CM | POA: Insufficient documentation

## 2018-11-15 DIAGNOSIS — K2970 Gastritis, unspecified, without bleeding: Secondary | ICD-10-CM | POA: Insufficient documentation

## 2018-11-15 DIAGNOSIS — G9341 Metabolic encephalopathy: Secondary | ICD-10-CM | POA: Diagnosis not present

## 2018-11-15 DIAGNOSIS — J810 Acute pulmonary edema: Secondary | ICD-10-CM | POA: Diagnosis present

## 2018-11-15 DIAGNOSIS — F1320 Sedative, hypnotic or anxiolytic dependence, uncomplicated: Secondary | ICD-10-CM | POA: Diagnosis present

## 2018-11-15 DIAGNOSIS — I10 Essential (primary) hypertension: Secondary | ICD-10-CM | POA: Diagnosis present

## 2018-11-15 DIAGNOSIS — Z66 Do not resuscitate: Secondary | ICD-10-CM | POA: Diagnosis not present

## 2018-11-15 DIAGNOSIS — Z9911 Dependence on respirator [ventilator] status: Secondary | ICD-10-CM | POA: Diagnosis not present

## 2018-11-15 DIAGNOSIS — I472 Ventricular tachycardia: Secondary | ICD-10-CM | POA: Diagnosis not present

## 2018-11-15 DIAGNOSIS — I4819 Other persistent atrial fibrillation: Secondary | ICD-10-CM | POA: Diagnosis present

## 2018-11-15 DIAGNOSIS — Z79899 Other long term (current) drug therapy: Secondary | ICD-10-CM | POA: Insufficient documentation

## 2018-11-15 DIAGNOSIS — J181 Lobar pneumonia, unspecified organism: Secondary | ICD-10-CM | POA: Diagnosis present

## 2018-11-15 DIAGNOSIS — E440 Moderate protein-calorie malnutrition: Secondary | ICD-10-CM | POA: Diagnosis present

## 2018-11-15 DIAGNOSIS — E875 Hyperkalemia: Secondary | ICD-10-CM | POA: Diagnosis present

## 2018-11-15 DIAGNOSIS — J80 Acute respiratory distress syndrome: Secondary | ICD-10-CM | POA: Diagnosis not present

## 2018-11-15 DIAGNOSIS — Z9889 Other specified postprocedural states: Secondary | ICD-10-CM | POA: Diagnosis not present

## 2018-11-15 DIAGNOSIS — J9622 Acute and chronic respiratory failure with hypercapnia: Secondary | ICD-10-CM | POA: Diagnosis not present

## 2018-11-15 DIAGNOSIS — Y92239 Unspecified place in hospital as the place of occurrence of the external cause: Secondary | ICD-10-CM | POA: Diagnosis not present

## 2018-11-15 DIAGNOSIS — R0602 Shortness of breath: Secondary | ICD-10-CM | POA: Diagnosis not present

## 2018-11-15 DIAGNOSIS — N179 Acute kidney failure, unspecified: Secondary | ICD-10-CM | POA: Diagnosis not present

## 2018-11-15 DIAGNOSIS — Z20828 Contact with and (suspected) exposure to other viral communicable diseases: Secondary | ICD-10-CM | POA: Diagnosis present

## 2018-11-15 DIAGNOSIS — Z87891 Personal history of nicotine dependence: Secondary | ICD-10-CM | POA: Insufficient documentation

## 2018-11-15 DIAGNOSIS — J690 Pneumonitis due to inhalation of food and vomit: Secondary | ICD-10-CM | POA: Diagnosis present

## 2018-11-15 DIAGNOSIS — J9621 Acute and chronic respiratory failure with hypoxia: Secondary | ICD-10-CM | POA: Diagnosis present

## 2018-11-15 DIAGNOSIS — R52 Pain, unspecified: Secondary | ICD-10-CM | POA: Diagnosis not present

## 2018-11-15 DIAGNOSIS — K219 Gastro-esophageal reflux disease without esophagitis: Secondary | ICD-10-CM | POA: Diagnosis present

## 2018-11-15 DIAGNOSIS — R918 Other nonspecific abnormal finding of lung field: Secondary | ICD-10-CM | POA: Diagnosis not present

## 2018-11-15 DIAGNOSIS — I959 Hypotension, unspecified: Secondary | ICD-10-CM | POA: Diagnosis not present

## 2018-11-15 DIAGNOSIS — K7689 Other specified diseases of liver: Secondary | ICD-10-CM | POA: Diagnosis not present

## 2018-11-15 DIAGNOSIS — I499 Cardiac arrhythmia, unspecified: Secondary | ICD-10-CM | POA: Diagnosis not present

## 2018-11-15 DIAGNOSIS — E870 Hyperosmolality and hypernatremia: Secondary | ICD-10-CM | POA: Diagnosis not present

## 2018-11-15 DIAGNOSIS — T461X5A Adverse effect of calcium-channel blockers, initial encounter: Secondary | ICD-10-CM | POA: Diagnosis not present

## 2018-11-15 DIAGNOSIS — Z7901 Long term (current) use of anticoagulants: Secondary | ICD-10-CM | POA: Diagnosis not present

## 2018-11-15 DIAGNOSIS — A419 Sepsis, unspecified organism: Secondary | ICD-10-CM | POA: Diagnosis present

## 2018-11-15 DIAGNOSIS — Z515 Encounter for palliative care: Secondary | ICD-10-CM | POA: Diagnosis not present

## 2018-11-15 DIAGNOSIS — J8410 Pulmonary fibrosis, unspecified: Secondary | ICD-10-CM | POA: Diagnosis not present

## 2018-11-15 DIAGNOSIS — R079 Chest pain, unspecified: Secondary | ICD-10-CM | POA: Diagnosis not present

## 2018-11-15 DIAGNOSIS — R069 Unspecified abnormalities of breathing: Secondary | ICD-10-CM | POA: Diagnosis not present

## 2018-11-15 DIAGNOSIS — Z978 Presence of other specified devices: Secondary | ICD-10-CM | POA: Diagnosis not present

## 2018-11-15 DIAGNOSIS — R0603 Acute respiratory distress: Secondary | ICD-10-CM | POA: Diagnosis not present

## 2018-11-15 DIAGNOSIS — G92 Toxic encephalopathy: Secondary | ICD-10-CM | POA: Diagnosis not present

## 2018-11-15 LAB — BASIC METABOLIC PANEL
Anion gap: 8 (ref 5–15)
BUN: 11 mg/dL (ref 8–23)
CO2: 26 mmol/L (ref 22–32)
Calcium: 8.2 mg/dL — ABNORMAL LOW (ref 8.9–10.3)
Chloride: 104 mmol/L (ref 98–111)
Creatinine, Ser: 0.75 mg/dL (ref 0.61–1.24)
GFR calc Af Amer: >60 mL/min (ref >60)
GFR calc non Af Amer: >60 mL/min (ref >60)
Glucose, Bld: 109 mg/dL — ABNORMAL HIGH (ref 70–99)
Potassium: 3.7 mmol/L (ref 3.5–5.1)
Sodium: 138 mmol/L (ref 135–145)

## 2018-11-15 LAB — CBC
HCT: 42.9 % (ref 39.0–52.0)
Hemoglobin: 13.8 g/dL (ref 13.0–17.0)
MCH: 31.4 pg (ref 26.0–34.0)
MCHC: 32.2 g/dL (ref 30.0–36.0)
MCV: 97.5 fL (ref 80.0–100.0)
Platelets: 130 10*3/uL — ABNORMAL LOW (ref 150–400)
RBC: 4.4 MIL/uL (ref 4.22–5.81)
RDW: 13 % (ref 11.5–15.5)
WBC: 12.3 10*3/uL — ABNORMAL HIGH (ref 4.0–10.5)
nRBC: 0 % (ref 0.0–0.2)

## 2018-11-15 LAB — TROPONIN I (HIGH SENSITIVITY)
Troponin I (High Sensitivity): 8 ng/L (ref <18)
Troponin I (High Sensitivity): 8 ng/L (ref <18)

## 2018-11-15 MED ORDER — ALUM & MAG HYDROXIDE-SIMETH 200-200-20 MG/5ML PO SUSP
30.0000 mL | Freq: Once | ORAL | Status: AC
  Administered 2018-11-15: 15:00:00 30 mL via ORAL
  Filled 2018-11-15: qty 30

## 2018-11-15 MED ORDER — FAMOTIDINE 20 MG PO TABS: 20.0000 mg | ORAL_TABLET | Freq: Two times a day (BID) | ORAL | 0 refills | Status: AC

## 2018-11-15 MED ORDER — MORPHINE SULFATE (PF) 4 MG/ML IV SOLN
4.0000 mg | Freq: Once | INTRAVENOUS | Status: AC
  Administered 2018-11-15: 16:00:00 4 mg via INTRAVENOUS
  Filled 2018-11-15: qty 1

## 2018-11-15 MED ORDER — FAMOTIDINE IN NACL 20-0.9 MG/50ML-% IV SOLN
20.0000 mg | Freq: Once | INTRAVENOUS | Status: AC
  Administered 2018-11-15: 15:00:00 20 mg via INTRAVENOUS
  Filled 2018-11-15: qty 50

## 2018-11-15 MED ORDER — ASPIRIN 81 MG PO CHEW
324.0000 mg | CHEWABLE_TABLET | Freq: Once | ORAL | Status: AC
  Administered 2018-11-15: 14:00:00 324 mg via ORAL
  Filled 2018-11-15 (×2): qty 4

## 2018-11-15 NOTE — ED Notes (Signed)
Pt SOB with attempting to stand and void.

## 2018-11-15 NOTE — ED Triage Notes (Signed)
Pt presents today with centralized chest pain, states this has happened twice in the past and it got better with pain medication. Pt reports it was due to "inflammation around his heart" Pt sees cardiology regularly. Pt also reports SHOB.

## 2018-11-15 NOTE — ED Provider Notes (Signed)
[facility] EMERGENCY DEPARTMENT Provider Note   CSN: [id number] Arrival date & time: 11/15/18  1323     History   Chief Complaint Chief Complaint  Patient presents with  . Chest Pain    HPI Curtis [name] is a 73 y.o. male with a past medical history of IPF, atrial fibrillation status post ablation, GERD, hypertension presenting to the ED with a chief complaint of chest pain.  Chest pain began about 24 hours ago.  Reports located on the left side of his chest, sharp with no specific aggravating or alleviating factors.  He reports similar symptoms back in August 2020 after an ablation for which she was admitted told him that he had "inflammation" around his heart.  He has been taking his home medications as previously prescribed.  He is also complaining of reflux symptoms which she believes is separate from his chest pain.  Reports shortness of breath at baseline, wears supplemental oxygen as needed.  He has not needed extra oxygen.  No improvement noted with aspirin given during arrival.  Denies any vomiting, fever, cough, leg swelling, history of PE.  He is currently anticoagulated on Eliquis.     HPI  Past Medical History:  Diagnosis Date  . Arthritis   . Asthma   . Atrial fibrillation ([place])   . Cataract   . Cervical spondylosis   . Chest pain    Emergency room October 24, 2011, no MI  //   Nuclear, October, 2013, adenosine, EF 61%, no scar or ischemia  . Dyslipidemia    Statin intolerant  . Dyspnea   . GERD (gastroesophageal reflux disease)   . Glaucoma   . Hearing loss   . Hypertension   . Hypertension   . Idiopathic pulmonary fibrosis ([place])   . Interstitial pulmonary fibrosis ([place]) 2012  . Left shoulder pain 2011  . Myocardial infarction ([place])   . Psoriasis   . Pulmonary fibrosis ([place])   . Statin intolerance     Patient Active Problem List   Diagnosis Date Noted  . Acute pericarditis   . Thrombocytopenia ([place]) 08/16/2018  . Glaucoma 08/16/2018  .  Impingement syndrome of right shoulder 12/12/2016  . AC (acromioclavicular) arthritis 12/12/2016  . Partial tear of right rotator cuff 12/12/2016  . Chronic respiratory failure ([place]) 04/26/2016  . Hyperbilirubinemia 04/26/2016  . H/O asbestos exposure 03/15/2016  . Persistent atrial fibrillation 02/19/2016  . Atrial fibrillation, persistent 02/19/2016  . IPF (idiopathic pulmonary fibrosis) ([place]) 02/09/2016  . Incidental lung nodule, greater than or equal to 93mm 02/09/2016  . Bell's palsy 08/03/2015  . Insomnia 06/08/2015  . Hypertension 08/18/2014  . Dyslipidemia 08/18/2014  . Gastroesophageal reflux disease 08/18/2014  . Trigeminal neuralgia of right side of face 07/05/2014  . Right trigeminal neuralgia 04/01/2014  . Chronic back pain 03/01/2014  . Elevated transaminase level 06/25/2013  . Elevation of level of transaminase or lactic acid dehydrogenase (LDH) 06/25/2013  . Interstitial pulmonary disease, unspecified ([place]) 01/22/2013  . Obesity (BMI 30-39.9) 09/09/2012  . Chronic neck pain 09/09/2012  . Body mass index (BMI) greater than 30 in adult 09/09/2012  . Symptomatic cholelithiasis 04/14/2012  . Chest pain   . Statin intolerance   . INTERSTITIAL LUNG DISEASE 03/16/2010  . Psoriasis 03/16/2010    Past Surgical History:  Procedure Laterality Date  . ATRIAL FIBRILLATION ABLATION N/A 02/18/2018   Procedure: ATRIAL FIBRILLATION ABLATION;  Surgeon: Constance Haw, MD;  Location: [place] [facility];  Service: Cardiovascular;  Laterality: N/A;  .  ATRIAL FIBRILLATION ABLATION N/A 07/17/2018   Procedure: ATRIAL FIBRILLATION ABLATION;  Surgeon: Constance Haw, MD;  Location: [place] [facility];  Service: Cardiovascular;  Laterality: N/A;  . BRAIN SURGERY    . CARDIOVERSION N/A 12/13/2015   Procedure: CARDIOVERSION;  Surgeon: Josue Hector, MD;  Location: [place];  Service: Cardiovascular;  Laterality: N/A;  . CATARACT EXTRACTION W/PHACO Right 06/11/2012   Procedure:  CATARACT EXTRACTION PHACO AND INTRAOCULAR LENS PLACEMENT ([place]);  Surgeon: Tonny Branch, MD;  Location: [facility];  Service: Ophthalmology;  Laterality: Right;  CDE:  14.43  . CATARACT EXTRACTION W/PHACO Left 06/29/2012   Procedure: CATARACT EXTRACTION PHACO AND INTRAOCULAR LENS PLACEMENT (I[facility]);  Surgeon: Tonny Branch, MD;  Location: [facility];  Service: Ophthalmology;  Laterality: Left;  CDE:14.11  . CHOLECYSTECTOMY  05/08/2012  . CHOLECYSTECTOMY N/A 05/08/2012   Procedure: LAPAROSCOPIC CHOLECYSTECTOMY;  Surgeon: Harl Bowie, MD;  Location: [place];  Service: General;  Laterality: N/A;  . COLONOSCOPY    . LIVER BIOPSY N/A 05/08/2012   Procedure: LIVER BIOPSY;  Surgeon: Harl Bowie, MD;  Location: [place];  Service: General;  Laterality: N/A;  . NECK SURGERY     cervical disc with cliip  . SHOULDER ARTHROSCOPY WITH DISTAL CLAVICLE RESECTION Right 12/12/2016   Procedure: RIGHT SHOULDER ARTHROSCOPY, SUBACROMIAL DECOMPRESSION, DISTAL CLAVICLE RESECTION, WITH POSSIBLE MINI OPEN ROTATOR CUFF REPAIR;  Surgeon: [name], MD;  Location: [place] [facility];  Service: Orthopedics;  Laterality: Right;        Home Medications    Prior to Admission medications   Medication Sig Start Date End Date Taking? Authorizing Provider  albuterol (PROVENTIL HFA;VENTOLIN HFA) 108 (90 Base) MCG/ACT inhaler Inhale 2 puffs into the lungs every 6 (six) hours as needed for wheezing. 04/08/16  Yes Kathyrn Drown, MD  apixaban (ELIQUIS) 5 MG TABS tablet Take 1 tablet (5 mg total) by mouth 2 (two) times daily. 07/27/18  Yes Sherran Needs, NP  brimonidine (ALPHAGAN) 0.2 % ophthalmic solution Place 1 drop into both eyes 2 (two) times daily. 07/30/18 07/30/19 Yes [provider]  calcium carbonate (TUMS - DOSED IN MG ELEMENTAL CALCIUM) 500 MG chewable tablet Chew 1 tablet by mouth daily as needed for indigestion or heartburn.   Yes [provider]  diltiazem (CARTIA XT) 120 MG 24 hr capsule TAKE 1 CAPSULE(120 MG) BY  MOUTH DAILY Patient taking differently: Take 120 mg by mouth every evening.  07/22/18  Yes Sherran Needs, NP  dorzolamide (TRUSOPT) 2 % ophthalmic solution Place 1 drop into the left eye 2 (two) times daily. 10/25/18  Yes [provider]  fexofenadine (ALLEGRA) 180 MG tablet Take 180 mg by mouth daily as needed for allergies or rhinitis.   Yes [provider]  fluticasone (FLONASE) 50 MCG/ACT nasal spray Place 2 sprays into both nostrils daily. Patient taking differently: Place 1 spray into both nostrils daily.  01/02/16  Yes Kathyrn Drown, MD  HYDROcodone-acetaminophen (NORCO/VICODIN) 5-325 MG tablet Take 1 tablet by mouth 4 (four) times daily as needed for moderate pain 10/21/18  Yes Luking, Scott A, MD  ibuprofen (ADVIL) 200 MG tablet Take 200 mg by mouth every 6 (six) hours as needed for moderate pain.   Yes [provider]  latanoprost (XALATAN) 0.005 % ophthalmic solution Place 1 drop into the left eye at bedtime.  07/30/18 07/30/19 Yes [provider]  LORazepam (ATIVAN) 0.5 MG tablet Take one tablet po q 8 hrs prn anxiety Patient taking differently:  Take 0.5 mg by mouth every 8 (eight) hours as needed for anxiety.  11/06/18  Yes Mikey Kirschner, MD  OXYGEN Inhale 2.5-2.75 L into the lungs daily as needed (for shortness of breath).    Yes [provider]  ALPRAZolam (XANAX) 0.5 MG tablet Take 0.5-1 tablets (0.25-0.5 mg total) by mouth 2 (two) times daily as needed for anxiety or sleep. Patient not taking: Reported on 11/15/2018 07/22/18   Kathyrn Drown, MD  colchicine 0.6 MG tablet Take 1 tablet (0.6 mg total) by mouth 2 (two) times daily. Patient not taking: Reported on 11/15/2018 08/17/18 11/15/18  Radene Gunning, NP  famotidine (PEPCID) 20 MG tablet Take 1 tablet (20 mg total) by mouth 2 (two) times daily. 11/15/18   Safwan Tomei, PA-C  HYDROcodone-acetaminophen (NORCO/VICODIN) 5-325 MG tablet Take 1 tablet by mouth 4 (four) times daily as  needed for moderate pain. Patient not taking: Reported on 11/15/2018 10/21/18   Kathyrn Drown, MD  HYDROcodone-acetaminophen (NORCO/VICODIN) 5-325 MG tablet Take 1 tablet by mouth 4 (four) times daily as needed for moderate pain Patient not taking: Reported on 11/15/2018 10/21/18   Kathyrn Drown, MD  pantoprazole (PROTONIX) 40 MG tablet Take 1 tablet (40 mg total) by mouth daily at 6 (six) AM. Patient not taking: Reported on 11/15/2018 08/17/18 11/15/18  Radene Gunning, NP  predniSONE (DELTASONE) 10 MG tablet Take 3 tablets qd for 3 days then take 2 tablets qd for 3 days then take one tablet po qd for 2 days Patient not taking: Reported on 11/15/2018 11/06/18   Mikey Kirschner, MD    Family History Family History  Problem Relation Age of Onset  . Hypertension Mother   . Congestive Heart Failure Mother   . COPD Mother   . Stroke Father   . Breast cancer Sister     Social History Social History   Tobacco Use  . Smoking status: Former Smoker    Packs/day: 0.50    Years: 52.00    Pack years: 26.00    Types: Cigarettes    Quit date: 09/08/2003    Years since quitting: 15.1  . Smokeless tobacco: Never Used  Substance Use Topics  . Alcohol use: No    Comment: quit drinking 2004  . Drug use: No     Allergies   Pirfenidone, Acyclovir and related, Oxycodone, Pravastatin, Simvastatin, Prednisone, and Tegretol [carbamazepine]   Review of Systems Review of Systems  Constitutional: Negative for appetite change, chills and fever.  HENT: Negative for ear pain, rhinorrhea, sneezing and sore throat.   Eyes: Negative for photophobia and visual disturbance.  Respiratory: Negative for cough, chest tightness, shortness of breath and wheezing.   Cardiovascular: Positive for chest pain. Negative for palpitations.  Gastrointestinal: Negative for abdominal pain, blood in stool, constipation, diarrhea, nausea and vomiting.  Genitourinary: Negative for dysuria, hematuria and urgency.   Musculoskeletal: Negative for myalgias.  Skin: Negative for rash.  Neurological: Negative for dizziness, weakness and light-headedness.     Physical Exam Updated Vital Signs BP (!) 141/83   Pulse 79   Temp 97.9 F (36.6 C) (Oral)   Resp (!) 23   Ht 6\' 1"  (1.854 m)   Wt 108.9 kg   SpO2 98%   BMI 31.66 kg/m   Physical Exam Vitals signs and nursing note reviewed.  Constitutional:      General: He is not in acute distress.    Appearance: He is well-developed.     Comments: Appears  uncomfortable.  HENT:     Head: Normocephalic and atraumatic.     Nose: Nose normal.  Eyes:     General: No scleral icterus.       Left eye: No discharge.     Conjunctiva/sclera: Conjunctivae normal.  Neck:     Musculoskeletal: Normal range of motion and neck supple.  Cardiovascular:     Rate and Rhythm: Normal rate and regular rhythm.     Heart sounds: Normal heart sounds. No murmur. No friction rub. No gallop.   Pulmonary:     Effort: Pulmonary effort is normal. No respiratory distress.     Breath sounds: Normal breath sounds.  Abdominal:     General: Bowel sounds are normal. There is no distension.     Palpations: Abdomen is soft.     Tenderness: There is no abdominal tenderness. There is no guarding.  Musculoskeletal: Normal range of motion.  Skin:    General: Skin is warm and dry.     Findings: No rash.  Neurological:     Mental Status: He is alert.     Motor: No abnormal muscle tone.     Coordination: Coordination normal.      ED Treatments / Results  Labs (all labs ordered are listed, but only abnormal results are displayed) Labs Reviewed  BASIC METABOLIC PANEL - Abnormal; Notable for the following components:      Result Value   Glucose, Bld 109 (*)    Calcium 8.2 (*)    All other components within normal limits  CBC - Abnormal; Notable for the following components:   WBC 12.3 (*)    Platelets 130 (*)    All other components within normal limits  TROPONIN I (HIGH  SENSITIVITY)  TROPONIN I (HIGH SENSITIVITY)    EKG EKG Interpretation  Date/Time:  Sunday November 15 2018 13:33:16 EST Ventricular Rate:  91 PR Interval:  126 QRS Duration: 84 QT Interval:  356 QTC Calculation: 437 R Axis:     Text Interpretation: Sinus rhythm with Premature atrial complexes Left ventricular hypertrophy T wave abnormality, consider inferior ischemia Abnormal ECG No STEMI Confirmed by Long, Joshua ([id number]) on 11/15/2018 1:42:54 PM   Radiology Dg Chest 2 View  Result Date: 11/15/2018 CLINICAL DATA:  Patient with chest pain. EXAM: CHEST - 2 VIEW COMPARISON:  Chest radiograph 08/16/2018 FINDINGS: Monitoring leads overlie the patient. Stable cardiac and mediastinal contours. Similar-appearing diffuse bilateral interstitial pulmonary opacities. No definite superimposed pulmonary consolidation. No pleural effusion or pneumothorax. IMPRESSION: Similar-appearing diffuse bilateral interstitial pulmonary opacities compatible with interstitial lung disease. Electronically Signed   By: Drew  Davis M.D.   On: 11/15/2018 14:53    Procedures Procedures (including critical care time)  Medications Ordered in ED Medications  aspirin chewable tablet 324 mg (324 mg Oral Given 11/15/18 1408)  famotidine (PEPCID) IVPB 20 mg premix (0 mg Intravenous Stopped 11/15/18 1542)  alum & mag hydroxide-simeth (MAALOX/MYLANTA) 200-200-20 MG/5ML suspension 30 mL (30 mLs Oral Given 11/15/18 1508)  morphine 4 MG/ML injection 4 mg (4 mg Intravenous Given 11/15/18 1559)     Initial Impression / Assessment and Plan / ED Course  I have reviewed the triage vital signs and the nursing notes.  Pertinent labs & imaging results that were available during my care of the patient were reviewed by me and considered in my medical decision making (see chart for details).        73  year old male with past medical history of IPF, atrial fibrillation status post ablation, GERD,  hypertension presenting to the ED  with a chief complaint of chest pain.  Left-sided chest pain that began 24 hours ago with no specific aggravating or alleviating factor.  Also complaining of reflux symptoms.  Were supplemental oxygen as needed and shortness of breath at baseline.  On exam patient with tenderness to palpation of the left side of chest and epigastric area.  He has no lower extremity edema, erythema or calf tenderness of concern me for DVT.  Vital signs are within normal limits.  Chest x-ray is unremarkable.  EKG shows no changes from prior tracings, no ischemic changes.  CBC, BMP unremarkable.  Initial and delta troponin are both negative.  Patient symptoms of chest pain and reflux both controlled here in the ED.  Due to patient's HEART score moderation, urged admission for observation but patient declined.  The work-up here is reassuring and his symptoms have been, due to his comorbidities.  States that "I don't want to be here, I know I can call my doctor in the morning and get this taken care of."  Reiterated importance of admission but patient continues to decline.  Will discharge home with Pepcid as needed and follow-up information.  Patient is hemodynamically stable, in NAD, and able to ambulate in the ED. Evaluation does not show pathology that would require ongoing emergent intervention or inpatient treatment. I explained the diagnosis to the patient. Pain has been managed and has no complaints prior to discharge. Patient is comfortable with above plan and is stable for discharge at this time. All questions were answered prior to disposition. Strict return precautions for returning to the ED were discussed. Encouraged follow up with PCP.   An After Visit Summary was printed and given to the patient.   Portions of this note were generated with Lobbyist. Dictation errors may occur despite best attempts at proofreading.   Final Clinical Impressions(s) / ED Diagnoses   Final diagnoses:  Chest wall  pain  Acute gastritis without hemorrhage, unspecified gastritis type    ED Discharge Orders         Ordered    famotidine (PEPCID) 20 MG tablet  2 times daily     11/15/18 1708           Delia Heady, PA-C 11/15/18 1711    Margette Fast, MD 11/15/18 1859

## 2018-11-15 NOTE — Discharge Instructions (Signed)
Continue your home medications as previously prescribed. Please follow-up with your cardiologist tomorrow for further evaluation. Return to the ED if you start to have worsening symptoms, worsening chest pain, leg swelling, shortness of breath.

## 2018-11-15 NOTE — ED Notes (Signed)
Patient denies any improvement in chest pain. EDPa made aware.

## 2018-11-16 ENCOUNTER — Emergency Department (HOSPITAL_COMMUNITY): Payer: PPO

## 2018-11-16 ENCOUNTER — Inpatient Hospital Stay (HOSPITAL_COMMUNITY)
Admission: EM | Admit: 2018-11-16 | Discharge: 2018-12-08 | DRG: 871 | Disposition: E | Payer: PPO | Attending: Internal Medicine | Admitting: Internal Medicine

## 2018-11-16 ENCOUNTER — Other Ambulatory Visit: Payer: Self-pay

## 2018-11-16 ENCOUNTER — Encounter (HOSPITAL_COMMUNITY): Payer: Self-pay | Admitting: Emergency Medicine

## 2018-11-16 ENCOUNTER — Encounter: Payer: Self-pay | Admitting: Family Medicine

## 2018-11-16 DIAGNOSIS — Z888 Allergy status to other drugs, medicaments and biological substances status: Secondary | ICD-10-CM

## 2018-11-16 DIAGNOSIS — Z66 Do not resuscitate: Secondary | ICD-10-CM | POA: Diagnosis not present

## 2018-11-16 DIAGNOSIS — I2510 Atherosclerotic heart disease of native coronary artery without angina pectoris: Secondary | ICD-10-CM | POA: Diagnosis present

## 2018-11-16 DIAGNOSIS — G8929 Other chronic pain: Secondary | ICD-10-CM | POA: Diagnosis present

## 2018-11-16 DIAGNOSIS — Z9842 Cataract extraction status, left eye: Secondary | ICD-10-CM

## 2018-11-16 DIAGNOSIS — R579 Shock, unspecified: Secondary | ICD-10-CM | POA: Diagnosis not present

## 2018-11-16 DIAGNOSIS — E440 Moderate protein-calorie malnutrition: Secondary | ICD-10-CM | POA: Diagnosis present

## 2018-11-16 DIAGNOSIS — E860 Dehydration: Secondary | ICD-10-CM | POA: Diagnosis not present

## 2018-11-16 DIAGNOSIS — Z803 Family history of malignant neoplasm of breast: Secondary | ICD-10-CM

## 2018-11-16 DIAGNOSIS — I959 Hypotension, unspecified: Secondary | ICD-10-CM | POA: Diagnosis not present

## 2018-11-16 DIAGNOSIS — Z77090 Contact with and (suspected) exposure to asbestos: Secondary | ICD-10-CM | POA: Diagnosis present

## 2018-11-16 DIAGNOSIS — N179 Acute kidney failure, unspecified: Secondary | ICD-10-CM | POA: Diagnosis not present

## 2018-11-16 DIAGNOSIS — Z01818 Encounter for other preprocedural examination: Secondary | ICD-10-CM

## 2018-11-16 DIAGNOSIS — J690 Pneumonitis due to inhalation of food and vomit: Secondary | ICD-10-CM | POA: Diagnosis present

## 2018-11-16 DIAGNOSIS — Z515 Encounter for palliative care: Secondary | ICD-10-CM | POA: Diagnosis not present

## 2018-11-16 DIAGNOSIS — N261 Atrophy of kidney (terminal): Secondary | ICD-10-CM | POA: Diagnosis present

## 2018-11-16 DIAGNOSIS — E870 Hyperosmolality and hypernatremia: Secondary | ICD-10-CM | POA: Diagnosis not present

## 2018-11-16 DIAGNOSIS — F1320 Sedative, hypnotic or anxiolytic dependence, uncomplicated: Secondary | ICD-10-CM | POA: Diagnosis present

## 2018-11-16 DIAGNOSIS — G9341 Metabolic encephalopathy: Secondary | ICD-10-CM | POA: Diagnosis not present

## 2018-11-16 DIAGNOSIS — Z87891 Personal history of nicotine dependence: Secondary | ICD-10-CM

## 2018-11-16 DIAGNOSIS — Z978 Presence of other specified devices: Secondary | ICD-10-CM

## 2018-11-16 DIAGNOSIS — D696 Thrombocytopenia, unspecified: Secondary | ICD-10-CM | POA: Diagnosis not present

## 2018-11-16 DIAGNOSIS — J181 Lobar pneumonia, unspecified organism: Secondary | ICD-10-CM | POA: Diagnosis present

## 2018-11-16 DIAGNOSIS — J810 Acute pulmonary edema: Secondary | ICD-10-CM | POA: Diagnosis present

## 2018-11-16 DIAGNOSIS — J9690 Respiratory failure, unspecified, unspecified whether with hypoxia or hypercapnia: Secondary | ICD-10-CM

## 2018-11-16 DIAGNOSIS — M1990 Unspecified osteoarthritis, unspecified site: Secondary | ICD-10-CM | POA: Diagnosis present

## 2018-11-16 DIAGNOSIS — Z961 Presence of intraocular lens: Secondary | ICD-10-CM | POA: Diagnosis present

## 2018-11-16 DIAGNOSIS — A419 Sepsis, unspecified organism: Principal | ICD-10-CM | POA: Diagnosis present

## 2018-11-16 DIAGNOSIS — E785 Hyperlipidemia, unspecified: Secondary | ICD-10-CM | POA: Diagnosis present

## 2018-11-16 DIAGNOSIS — K219 Gastro-esophageal reflux disease without esophagitis: Secondary | ICD-10-CM | POA: Diagnosis present

## 2018-11-16 DIAGNOSIS — J9601 Acute respiratory failure with hypoxia: Secondary | ICD-10-CM | POA: Diagnosis not present

## 2018-11-16 DIAGNOSIS — Z8249 Family history of ischemic heart disease and other diseases of the circulatory system: Secondary | ICD-10-CM

## 2018-11-16 DIAGNOSIS — I472 Ventricular tachycardia: Secondary | ICD-10-CM | POA: Diagnosis not present

## 2018-11-16 DIAGNOSIS — Z823 Family history of stroke: Secondary | ICD-10-CM

## 2018-11-16 DIAGNOSIS — Z791 Long term (current) use of non-steroidal anti-inflammatories (NSAID): Secondary | ICD-10-CM

## 2018-11-16 DIAGNOSIS — R0902 Hypoxemia: Secondary | ICD-10-CM

## 2018-11-16 DIAGNOSIS — Z7901 Long term (current) use of anticoagulants: Secondary | ICD-10-CM

## 2018-11-16 DIAGNOSIS — Z6831 Body mass index (BMI) 31.0-31.9, adult: Secondary | ICD-10-CM

## 2018-11-16 DIAGNOSIS — J80 Acute respiratory distress syndrome: Secondary | ICD-10-CM | POA: Diagnosis not present

## 2018-11-16 DIAGNOSIS — Z20828 Contact with and (suspected) exposure to other viral communicable diseases: Secondary | ICD-10-CM | POA: Diagnosis present

## 2018-11-16 DIAGNOSIS — Y95 Nosocomial condition: Secondary | ICD-10-CM | POA: Diagnosis present

## 2018-11-16 DIAGNOSIS — E875 Hyperkalemia: Secondary | ICD-10-CM | POA: Diagnosis present

## 2018-11-16 DIAGNOSIS — Z9889 Other specified postprocedural states: Secondary | ICD-10-CM | POA: Diagnosis not present

## 2018-11-16 DIAGNOSIS — T380X5A Adverse effect of glucocorticoids and synthetic analogues, initial encounter: Secondary | ICD-10-CM | POA: Diagnosis not present

## 2018-11-16 DIAGNOSIS — Z9841 Cataract extraction status, right eye: Secondary | ICD-10-CM

## 2018-11-16 DIAGNOSIS — I10 Essential (primary) hypertension: Secondary | ICD-10-CM | POA: Diagnosis present

## 2018-11-16 DIAGNOSIS — T461X5A Adverse effect of calcium-channel blockers, initial encounter: Secondary | ICD-10-CM | POA: Diagnosis not present

## 2018-11-16 DIAGNOSIS — G92 Toxic encephalopathy: Secondary | ICD-10-CM | POA: Diagnosis not present

## 2018-11-16 DIAGNOSIS — J9621 Acute and chronic respiratory failure with hypoxia: Secondary | ICD-10-CM

## 2018-11-16 DIAGNOSIS — I4819 Other persistent atrial fibrillation: Secondary | ICD-10-CM | POA: Diagnosis present

## 2018-11-16 DIAGNOSIS — J45909 Unspecified asthma, uncomplicated: Secondary | ICD-10-CM | POA: Diagnosis present

## 2018-11-16 DIAGNOSIS — H409 Unspecified glaucoma: Secondary | ICD-10-CM | POA: Diagnosis present

## 2018-11-16 DIAGNOSIS — E872 Acidosis: Secondary | ICD-10-CM | POA: Diagnosis present

## 2018-11-16 DIAGNOSIS — Z9981 Dependence on supplemental oxygen: Secondary | ICD-10-CM

## 2018-11-16 DIAGNOSIS — Z9049 Acquired absence of other specified parts of digestive tract: Secondary | ICD-10-CM

## 2018-11-16 DIAGNOSIS — D649 Anemia, unspecified: Secondary | ICD-10-CM | POA: Diagnosis not present

## 2018-11-16 DIAGNOSIS — M549 Dorsalgia, unspecified: Secondary | ICD-10-CM | POA: Diagnosis present

## 2018-11-16 DIAGNOSIS — Z885 Allergy status to narcotic agent status: Secondary | ICD-10-CM

## 2018-11-16 DIAGNOSIS — L8990 Pressure ulcer of unspecified site, unspecified stage: Secondary | ICD-10-CM | POA: Insufficient documentation

## 2018-11-16 DIAGNOSIS — F419 Anxiety disorder, unspecified: Secondary | ICD-10-CM | POA: Diagnosis present

## 2018-11-16 DIAGNOSIS — R739 Hyperglycemia, unspecified: Secondary | ICD-10-CM | POA: Diagnosis not present

## 2018-11-16 DIAGNOSIS — I252 Old myocardial infarction: Secondary | ICD-10-CM

## 2018-11-16 DIAGNOSIS — E669 Obesity, unspecified: Secondary | ICD-10-CM | POA: Diagnosis present

## 2018-11-16 DIAGNOSIS — R17 Unspecified jaundice: Secondary | ICD-10-CM

## 2018-11-16 DIAGNOSIS — L89151 Pressure ulcer of sacral region, stage 1: Secondary | ICD-10-CM | POA: Diagnosis present

## 2018-11-16 DIAGNOSIS — J84112 Idiopathic pulmonary fibrosis: Secondary | ICD-10-CM | POA: Diagnosis not present

## 2018-11-16 DIAGNOSIS — K7460 Unspecified cirrhosis of liver: Secondary | ICD-10-CM | POA: Diagnosis present

## 2018-11-16 DIAGNOSIS — Z79891 Long term (current) use of opiate analgesic: Secondary | ICD-10-CM

## 2018-11-16 DIAGNOSIS — Y92239 Unspecified place in hospital as the place of occurrence of the external cause: Secondary | ICD-10-CM | POA: Diagnosis not present

## 2018-11-16 DIAGNOSIS — Z79899 Other long term (current) drug therapy: Secondary | ICD-10-CM

## 2018-11-16 DIAGNOSIS — Z9911 Dependence on respirator [ventilator] status: Secondary | ICD-10-CM | POA: Diagnosis not present

## 2018-11-16 DIAGNOSIS — H9190 Unspecified hearing loss, unspecified ear: Secondary | ICD-10-CM | POA: Diagnosis present

## 2018-11-16 DIAGNOSIS — D72829 Elevated white blood cell count, unspecified: Secondary | ICD-10-CM | POA: Diagnosis not present

## 2018-11-16 DIAGNOSIS — K5900 Constipation, unspecified: Secondary | ICD-10-CM | POA: Diagnosis not present

## 2018-11-16 LAB — CBC WITH DIFFERENTIAL/PLATELET
Abs Immature Granulocytes: 0.17 10*3/uL — ABNORMAL HIGH (ref 0.00–0.07)
Basophils Absolute: 0 10*3/uL (ref 0.0–0.1)
Basophils Relative: 0 %
Eosinophils Absolute: 0.5 10*3/uL (ref 0.0–0.5)
Eosinophils Relative: 2 %
HCT: 41 % (ref 39.0–52.0)
Hemoglobin: 13.2 g/dL (ref 13.0–17.0)
Immature Granulocytes: 1 %
Lymphocytes Relative: 12 %
Lymphs Abs: 2.4 10*3/uL (ref 0.7–4.0)
MCH: 31.8 pg (ref 26.0–34.0)
MCHC: 32.2 g/dL (ref 30.0–36.0)
MCV: 98.8 fL (ref 80.0–100.0)
Monocytes Absolute: 0.6 10*3/uL (ref 0.1–1.0)
Monocytes Relative: 3 %
Neutro Abs: 16 10*3/uL — ABNORMAL HIGH (ref 1.7–7.7)
Neutrophils Relative %: 82 %
Platelets: 126 10*3/uL — ABNORMAL LOW (ref 150–400)
RBC: 4.15 MIL/uL — ABNORMAL LOW (ref 4.22–5.81)
RDW: 13 % (ref 11.5–15.5)
WBC: 19.6 10*3/uL — ABNORMAL HIGH (ref 4.0–10.5)
nRBC: 0 % (ref 0.0–0.2)

## 2018-11-16 LAB — TROPONIN I (HIGH SENSITIVITY)
Troponin I (High Sensitivity): 9 ng/L (ref <18)
Troponin I (High Sensitivity): 10 ng/L (ref <18)

## 2018-11-16 LAB — URINALYSIS, ROUTINE W REFLEX MICROSCOPIC
Bilirubin Urine: NEGATIVE
Glucose, UA: NEGATIVE mg/dL
Ketones, ur: NEGATIVE mg/dL
Leukocytes,Ua: NEGATIVE
Nitrite: NEGATIVE
Protein, ur: NEGATIVE mg/dL
Specific Gravity, Urine: 1.004 — ABNORMAL LOW (ref 1.005–1.030)
pH: 5 (ref 5.0–8.0)

## 2018-11-16 LAB — COMPREHENSIVE METABOLIC PANEL
ALT: 28 U/L (ref 0–44)
AST: 38 U/L (ref 15–41)
Albumin: 2.9 g/dL — ABNORMAL LOW (ref 3.5–5.0)
Alkaline Phosphatase: 105 U/L (ref 38–126)
Anion gap: 9 (ref 5–15)
BUN: 15 mg/dL (ref 8–23)
CO2: 25 mmol/L (ref 22–32)
Calcium: 8.3 mg/dL — ABNORMAL LOW (ref 8.9–10.3)
Chloride: 104 mmol/L (ref 98–111)
Creatinine, Ser: 0.8 mg/dL (ref 0.61–1.24)
GFR calc Af Amer: >60 mL/min (ref >60)
GFR calc non Af Amer: >60 mL/min (ref >60)
Glucose, Bld: 122 mg/dL — ABNORMAL HIGH (ref 70–99)
Potassium: 3.7 mmol/L (ref 3.5–5.1)
Sodium: 138 mmol/L (ref 135–145)
Total Bilirubin: 5.9 mg/dL — ABNORMAL HIGH (ref 0.3–1.2)
Total Protein: 6.6 g/dL (ref 6.5–8.1)

## 2018-11-16 LAB — RESPIRATORY PANEL BY PCR

## 2018-11-16 LAB — PROTIME-INR
INR: 1.8 — ABNORMAL HIGH (ref 0.8–1.2)
Prothrombin Time: 20.9 seconds — ABNORMAL HIGH (ref 11.4–15.2)

## 2018-11-16 LAB — SARS CORONAVIRUS 2 (TAT 6-24 HRS): SARS Coronavirus 2: NEGATIVE

## 2018-11-16 LAB — BRAIN NATRIURETIC PEPTIDE: B Natriuretic Peptide: 295 pg/mL — ABNORMAL HIGH (ref 0.0–100.0)

## 2018-11-16 LAB — APTT: aPTT: 39 seconds — ABNORMAL HIGH (ref 24–36)

## 2018-11-16 LAB — LACTIC ACID, PLASMA
Lactic Acid, Venous: 3.3 mmol/L (ref 0.5–1.9)
Lactic Acid, Venous: 4.1 mmol/L (ref 0.5–1.9)

## 2018-11-16 LAB — MRSA PCR SCREENING: MRSA by PCR: NEGATIVE

## 2018-11-16 LAB — PROCALCITONIN: Procalcitonin: 0.33 ng/mL

## 2018-11-16 MED ORDER — CHLORHEXIDINE GLUCONATE 0.12 % MT SOLN
15.0000 mL | Freq: Two times a day (BID) | OROMUCOSAL | Status: DC
  Administered 2018-11-16 – 2018-11-18 (×5): 15 mL via OROMUCOSAL
  Filled 2018-11-16 (×4): qty 15

## 2018-11-16 MED ORDER — METHYLPREDNISOLONE SODIUM SUCC 125 MG IJ SOLR: 125.0000 mg | Freq: Once | INTRAMUSCULAR | Status: DC

## 2018-11-16 MED ORDER — BUDESONIDE 0.5 MG/2ML IN SUSP
0.5000 mg | Freq: Two times a day (BID) | RESPIRATORY_TRACT | Status: DC
  Administered 2018-11-16 – 2018-11-19 (×6): 0.5 mg via RESPIRATORY_TRACT
  Filled 2018-11-16 (×5): qty 2

## 2018-11-16 MED ORDER — METHYLPREDNISOLONE SODIUM SUCC 125 MG IJ SOLR
60.0000 mg | Freq: Four times a day (QID) | INTRAMUSCULAR | Status: DC
  Administered 2018-11-16 – 2018-11-19 (×12): 60 mg via INTRAVENOUS
  Filled 2018-11-16 (×12): qty 2

## 2018-11-16 MED ORDER — VANCOMYCIN HCL IN DEXTROSE 750-5 MG/150ML-% IV SOLN
750.0000 mg | Freq: Two times a day (BID) | INTRAVENOUS | Status: DC
  Administered 2018-11-17: 02:00:00 750 mg via INTRAVENOUS
  Filled 2018-11-16: qty 150

## 2018-11-16 MED ORDER — ONDANSETRON HCL 4 MG/2ML IJ SOLN: 4.0000 mg | Freq: Four times a day (QID) | INTRAMUSCULAR | Status: DC | PRN

## 2018-11-16 MED ORDER — FENTANYL CITRATE (PF) 100 MCG/2ML IJ SOLN
50.0000 ug | Freq: Once | INTRAMUSCULAR | Status: AC
  Administered 2018-11-16: 20:00:00 50 ug via INTRAVENOUS
  Filled 2018-11-16: qty 2

## 2018-11-16 MED ORDER — DILTIAZEM HCL ER COATED BEADS 120 MG PO CP24
120.0000 mg | ORAL_CAPSULE | Freq: Every evening | ORAL | Status: DC
  Administered 2018-11-16 – 2018-11-18 (×3): 120 mg via ORAL
  Filled 2018-11-16 (×4): qty 1

## 2018-11-16 MED ORDER — DORZOLAMIDE HCL 2 % OP SOLN
1.0000 [drp] | Freq: Two times a day (BID) | OPHTHALMIC | Status: DC
  Administered 2018-11-17 – 2018-11-26 (×17): 1 [drp] via OPHTHALMIC
  Filled 2018-11-16 (×3): qty 10

## 2018-11-16 MED ORDER — ARFORMOTEROL TARTRATE 15 MCG/2ML IN NEBU
15.0000 ug | INHALATION_SOLUTION | Freq: Two times a day (BID) | RESPIRATORY_TRACT | Status: DC
  Administered 2018-11-16 – 2018-11-19 (×6): 15 ug via RESPIRATORY_TRACT
  Filled 2018-11-16 (×7): qty 2

## 2018-11-16 MED ORDER — VANCOMYCIN HCL IN DEXTROSE 1-5 GM/200ML-% IV SOLN
1000.0000 mg | Freq: Once | INTRAVENOUS | Status: DC
  Filled 2018-11-16: qty 200

## 2018-11-16 MED ORDER — ORAL CARE MOUTH RINSE
15.0000 mL | Freq: Two times a day (BID) | OROMUCOSAL | Status: DC
  Administered 2018-11-16 – 2018-11-18 (×4): 15 mL via OROMUCOSAL

## 2018-11-16 MED ORDER — APIXABAN 5 MG PO TABS
5.0000 mg | ORAL_TABLET | Freq: Two times a day (BID) | ORAL | Status: DC
  Administered 2018-11-16 – 2018-11-19 (×7): 5 mg via ORAL
  Filled 2018-11-16 (×7): qty 1

## 2018-11-16 MED ORDER — MAGNESIUM SULFATE 2 GM/50ML IV SOLN
2.0000 g | Freq: Once | INTRAVENOUS | Status: AC
  Administered 2018-11-16: 10:00:00 2 g via INTRAVENOUS
  Filled 2018-11-16: qty 50

## 2018-11-16 MED ORDER — ALBUTEROL SULFATE HFA 108 (90 BASE) MCG/ACT IN AERS
6.0000 | INHALATION_SPRAY | RESPIRATORY_TRACT | Status: DC | PRN
  Administered 2018-11-16: 10:00:00 6 via RESPIRATORY_TRACT
  Filled 2018-11-16: qty 6.7

## 2018-11-16 MED ORDER — PIPERACILLIN-TAZOBACTAM 3.375 G IVPB 30 MIN
3.3750 g | Freq: Once | INTRAVENOUS | Status: AC
  Administered 2018-11-16: 11:00:00 3.375 g via INTRAVENOUS
  Filled 2018-11-16: qty 50

## 2018-11-16 MED ORDER — ACETAMINOPHEN 650 MG RE SUPP: 650.0000 mg | Freq: Four times a day (QID) | RECTAL | Status: DC | PRN

## 2018-11-16 MED ORDER — HYDROCODONE-ACETAMINOPHEN 5-325 MG PO TABS
1.0000 | ORAL_TABLET | Freq: Once | ORAL | Status: AC
  Administered 2018-11-16: 11:00:00 1 via ORAL
  Filled 2018-11-16: qty 1

## 2018-11-16 MED ORDER — FUROSEMIDE 10 MG/ML IJ SOLN
40.0000 mg | Freq: Once | INTRAMUSCULAR | Status: AC
  Administered 2018-11-16: 11:00:00 40 mg via INTRAVENOUS
  Filled 2018-11-16: qty 4

## 2018-11-16 MED ORDER — ACETAMINOPHEN 325 MG PO TABS
650.0000 mg | ORAL_TABLET | Freq: Four times a day (QID) | ORAL | Status: DC | PRN
  Administered 2018-11-18: 650 mg via ORAL
  Filled 2018-11-16: qty 2

## 2018-11-16 MED ORDER — BRIMONIDINE TARTRATE 0.2 % OP SOLN
1.0000 [drp] | Freq: Two times a day (BID) | OPHTHALMIC | Status: DC
  Administered 2018-11-17 – 2018-11-26 (×16): 1 [drp] via OPHTHALMIC
  Filled 2018-11-16 (×3): qty 5

## 2018-11-16 MED ORDER — VANCOMYCIN HCL IN DEXTROSE 1-5 GM/200ML-% IV SOLN
1000.0000 mg | INTRAVENOUS | Status: AC
  Administered 2018-11-16 (×2): 1000 mg via INTRAVENOUS
  Filled 2018-11-16: qty 200

## 2018-11-16 MED ORDER — SODIUM CHLORIDE 0.9 % IV SOLN
2.0000 g | Freq: Three times a day (TID) | INTRAVENOUS | Status: DC
  Administered 2018-11-16 – 2018-11-22 (×18): 2 g via INTRAVENOUS
  Filled 2018-11-16 (×19): qty 2

## 2018-11-16 MED ORDER — LATANOPROST 0.005 % OP SOLN
1.0000 [drp] | Freq: Every day | OPHTHALMIC | Status: DC
  Administered 2018-11-17 – 2018-11-24 (×8): 1 [drp] via OPHTHALMIC
  Filled 2018-11-16 (×2): qty 2.5

## 2018-11-16 MED ORDER — ONDANSETRON HCL 4 MG PO TABS: 4.0000 mg | ORAL_TABLET | Freq: Four times a day (QID) | ORAL | Status: DC | PRN

## 2018-11-16 MED ORDER — IPRATROPIUM-ALBUTEROL 0.5-2.5 (3) MG/3ML IN SOLN
3.0000 mL | RESPIRATORY_TRACT | Status: DC
  Administered 2018-11-16 – 2018-11-18 (×12): 3 mL via RESPIRATORY_TRACT
  Filled 2018-11-16 (×11): qty 3

## 2018-11-16 MED ORDER — KETOROLAC TROMETHAMINE 15 MG/ML IJ SOLN
15.0000 mg | Freq: Once | INTRAMUSCULAR | Status: AC
  Administered 2018-11-16: 20:00:00 15 mg via INTRAVENOUS
  Filled 2018-11-16: qty 1

## 2018-11-16 NOTE — Progress Notes (Signed)
Pt transported to new ICU room.  RT will continue to monitor.

## 2018-11-16 NOTE — Progress Notes (Signed)
Notified bedside nurse of need to draw repeat lactic acid. 

## 2018-11-16 NOTE — Progress Notes (Signed)
Notified bedside nurse of need to draw repeat lactic acid in 2 hours and pt needs fluid bolus pf 3267 cc.

## 2018-11-16 NOTE — ED Notes (Signed)
CRITICAL VALUE ALERT  Critical Value:  Lactic Acid 4.1   Date & Time Notied:  12/06/2018 1314  Provider Notified: Dr. Carles Collet   Orders Received/Actions taken: None yet

## 2018-11-16 NOTE — Progress Notes (Signed)
Pharmacy Antibiotic Note  Curtis [name] is a 73 y.o. male admitted on 11/15/2018 with pneumonia.  Pharmacy has been consulted for Vancomycin and cefepime dosing.  Plan: Vancomycin 2000mg  loading dose, then 750 IV every 12 hours.  Goal trough 15-20 mcg/mL.  Cefepime 2gm IV q8h F/U cxs and clinical progress Monitor V/S, labs and levels as indicated  Height: 6\' 1"  (185.4 cm) Weight: 240 lb (108.9 kg) IBW/kg (Calculated) : 79.9  Temp (24hrs), Avg:97.8 F (36.6 C), Min:97.7 F (36.5 C), Max:97.9 F (36.6 C)  Recent Labs  Lab 11/15/18 1402 12/01/2018 0909 11/08/2018 1022  WBC 12.3* 19.6*  --   CREATININE 0.75 0.80  --   LATICACIDVEN  --   --  3.3*    Estimated Creatinine Clearance: 103.3 mL/min (by C-G formula based on SCr of 0.8 mg/dL).   Normalized CrCL 62mls/min  Allergies  Allergen Reactions  . Pirfenidone Other (See Comments)    Elevated bilirubin  . Acyclovir And Related Other (See Comments)    Unknown  . Oxycodone Itching  . Pravastatin Other (See Comments)    Chills    . Simvastatin Other (See Comments)    Chills and Leg pain  . Prednisone Anxiety and Other (See Comments)    Irritation, Can tolerate in small doses   . Tegretol [Carbamazepine] Other (See Comments)    dizziness    Antimicrobials this admission: Vancomycin 11/9 >>  Cefepime 11/9 >> Zosyn x 1 dose in ED 11/9   Microbiology results: 11/9 BCx: pending 11/19 UCx: pending 11/19 SARS-2 CV: pending  MRSA PCR:   Thank you for allowing pharmacy to be a part of this patient's care.  Isac Sarna, BS [place], [place] Clinical Pharmacist Pager [phone] 12/07/2018 11:31 AM

## 2018-11-16 NOTE — ED Triage Notes (Signed)
Per ems pt tried to go to the doctor this morning but was to sob and weak. He was here yesterday for chest pain

## 2018-11-16 NOTE — H&P (Signed)
History and Physical  Curtis [name] KPQ:[id number] DOB: August 30, 1945 DOA: 11/20/2018   PCP: Kathyrn Drown, MD   Patient coming from: Home  Chief Complaint: sob  HPI:  Curtis [name] is a 73 y.o. male with medical history of IPF, persistent atrial fib s/p ablation 02/18/2018 and 07/17/2018, hypertension, hyperlipidemia, coronary disease, and thrombocytopenia presenting with shortness of breath that began on 11/14/2018 that was significantly worsened in the morning of 12/07/2018.  Patient actually visited emergency department on 11/15/2018 secondary to having some left-sided chest pain.  It was felt this was likely musculoskeletal.  His troponins were negative at that time.  Admission was advised at that time, but the patient deferred.  The patient stated he had some shortness of breath at that time when he was in the emergency department, but has significantly worsened over the last 24 hours.  He denies any fevers, chills, hemoptysis, nausea, vomiting, diarrhea, abdominal pain, headache, neck pain.  He denies any Covid contacts.  Notably, the patient had hospital mission from 08/16/2018 to 08/17/2018 for chest pain.  He was seen by cardiology who felt the patient had pericarditis at that time.  He has finished 3 months of colchicine.  He saw his primary care provider approximately 2 weeks prior to this admission.  He was felt to have had rhinosinusitis and possibly an exacerbation of his IPF.  He was placed on antibiotics as well as 5 days of prednisone which he finished approximately 1 week prior to this admission. In the emergency department, the patient was afebrile hemodynamically stable saturating 70% on room air.  He was placed on nonrebreather.  However, the patient desaturated into the 70s with minimal exertion.  I have asked the patient to be placed on BiPAP at this time.  Chest x-ray showed bilateral interstitial and lower lobe opacities.  BMP was unremarkable.  AST 38, ALT 28, alk  phosphatase 105, total bilirubin 5.9.  WBC was 19.5 with hemoglobin 13.2, platelets 126.  Troponin was 9.  BNP 295.  Patient was given Solu-Medrol, furosemide, vancomycin, and Zosyn.  Assessment/Plan: Acute on chronic respiratory failure with hypoxia -Secondary to pneumonia and exacerbation of his IPF -Place patient on -Wean oxygen back to baseline as tolerated -SARS-CoV2--pending -check viral respiratory panel  Healthcare associated pneumonia/lobar pneumonia -Start vancomycin and cefepime -Check procalcitonin -Follow blood cultures  Sepsis -Present on admission -Secondary to pneumonia -Judicious IV fluids -Lactic acid peaked 3.3 -Check procalcitonin -Follow blood culture -Continue vancomycin and cefepime  Lactic acidosis -I feel that much of his lactic acidosis is due to his hypoxia -Judicious IV fluids  Exacerbation of IPF -Start IV steroids -Start Pulmicort -Start Brovana  -Duo nebs  Persistent atrial fibrillation -Status post ablation 02/18/2018 and 07/17/2018 -Continue apixaban -Continue diltiazem XT  Coronary artery disease -No chest pain presently      Past Medical History:  Diagnosis Date  . Arthritis   . Asthma   . Atrial fibrillation ([place])   . Cataract   . Cervical spondylosis   . Chest pain    Emergency room October 24, 2011, no MI  //   Nuclear, October, 2013, adenosine, EF 61%, no scar or ischemia  . Dyslipidemia    Statin intolerant  . Dyspnea   . GERD (gastroesophageal reflux disease)   . Glaucoma   . Hearing loss   . Hypertension   . Hypertension   . Idiopathic pulmonary fibrosis ([place])   . Interstitial pulmonary fibrosis ([place]) 2012  . Left shoulder pain  2011  . Myocardial infarction ([place])   . Psoriasis   . Pulmonary fibrosis ([place])   . Statin intolerance    Past Surgical History:  Procedure Laterality Date  . ATRIAL FIBRILLATION ABLATION N/A 02/18/2018   Procedure: ATRIAL FIBRILLATION ABLATION;  Surgeon: Constance Haw, MD;   Location: [facility];  Service: Cardiovascular;  Laterality: N/A;  . ATRIAL FIBRILLATION ABLATION N/A 07/17/2018   Procedure: ATRIAL FIBRILLATION ABLATION;  Surgeon: Constance Haw, MD;  Location: [facility];  Service: Cardiovascular;  Laterality: N/A;  . BRAIN SURGERY    . CARDIOVERSION N/A 12/13/2015   Procedure: CARDIOVERSION;  Surgeon: Josue Hector, MD;  Location: [facility];  Service: Cardiovascular;  Laterality: N/A;  . CATARACT EXTRACTION W/PHACO Right 06/11/2012   Procedure: CATARACT EXTRACTION PHACO AND INTRAOCULAR LENS PLACEMENT ([place]);  Surgeon: Tonny Branch, MD;  Location: [facility];  Service: Ophthalmology;  Laterality: Right;  CDE:  14.43  . CATARACT EXTRACTION W/PHACO Left 06/29/2012   Procedure: CATARACT EXTRACTION PHACO AND INTRAOCULAR LENS PLACEMENT ([facility]);  Surgeon: Tonny Branch, MD;  Location: [facility];  Service: Ophthalmology;  Laterality: Left;  CDE:14.11  . CHOLECYSTECTOMY  05/08/2012  . CHOLECYSTECTOMY N/A 05/08/2012   Procedure: LAPAROSCOPIC CHOLECYSTECTOMY;  Surgeon: Harl Bowie, MD;  Location: [facility];  Service: General;  Laterality: N/A;  . COLONOSCOPY    . LIVER BIOPSY N/A 05/08/2012   Procedure: LIVER BIOPSY;  Surgeon: Harl Bowie, MD;  Location: [facility];  Service: General;  Laterality: N/A;  . NECK SURGERY     cervical disc with cliip  . SHOULDER ARTHROSCOPY WITH DISTAL CLAVICLE RESECTION Right 12/12/2016   Procedure: RIGHT SHOULDER ARTHROSCOPY, SUBACROMIAL DECOMPRESSION, DISTAL CLAVICLE RESECTION, WITH POSSIBLE MINI OPEN ROTATOR CUFF REPAIR;  Surgeon: [name], MD;  Location: [facility];  Service: Orthopedics;  Laterality: Right;   Social History:  reports that he quit smoking about 15 years ago. His smoking use included cigarettes. He has a 26.00 pack-year smoking history. He has never used smokeless tobacco. He reports that he does not drink alcohol or use drugs.   Family History  Problem Relation Age of Onset  . Hypertension Mother   .  Congestive Heart Failure Mother   . COPD Mother   . Stroke Father   . Breast cancer Sister      Allergies  Allergen Reactions  . Pirfenidone Other (See Comments)    Elevated bilirubin  . Acyclovir And Related Other (See Comments)    Unknown  . Oxycodone Itching  . Pravastatin Other (See Comments)    Chills    . Simvastatin Other (See Comments)    Chills and Leg pain  . Prednisone Anxiety and Other (See Comments)    Irritation, Can tolerate in small doses   . Tegretol [Carbamazepine] Other (See Comments)    dizziness     Prior to Admission medications   Medication Sig Start Date End Date Taking? Authorizing Provider  albuterol (PROVENTIL HFA;VENTOLIN HFA) 108 (90 Base) MCG/ACT inhaler Inhale 2 puffs into the lungs every 6 (six) hours as needed for wheezing. 04/08/16  Yes Kathyrn Drown, MD  apixaban (ELIQUIS) 5 MG TABS tablet Take 1 tablet (5 mg total) by mouth 2 (two) times daily. 07/27/18  Yes Sherran Needs, NP  brimonidine (ALPHAGAN) 0.2 % ophthalmic solution Place 1 drop into both eyes 2 (two) times daily. 07/30/18 07/30/19 Yes [provider]  calcium carbonate (TUMS - DOSED IN MG ELEMENTAL CALCIUM) 500 MG chewable tablet  Chew 1 tablet by mouth daily as needed for indigestion or heartburn.   Yes [provider]  diltiazem (CARTIA XT) 120 MG 24 hr capsule TAKE 1 CAPSULE(120 MG) BY MOUTH DAILY Patient taking differently: Take 120 mg by mouth every evening.  07/22/18  Yes Sherran Needs, NP  dorzolamide (TRUSOPT) 2 % ophthalmic solution Place 1 drop into the left eye 2 (two) times daily. 10/25/18  Yes [provider]  famotidine (PEPCID) 20 MG tablet Take 1 tablet (20 mg total) by mouth 2 (two) times daily. 11/15/18  Yes Khatri, Hina, PA-C  fexofenadine (ALLEGRA) 180 MG tablet Take 180 mg by mouth daily as needed for allergies or rhinitis.   Yes [provider]  fluticasone (FLONASE) 50 MCG/ACT nasal spray Place 2 sprays into both nostrils  daily. Patient taking differently: Place 1 spray into both nostrils daily.  01/02/16  Yes Kathyrn Drown, MD  HYDROcodone-acetaminophen (NORCO/VICODIN) 5-325 MG tablet Take 1 tablet by mouth 4 (four) times daily as needed for moderate pain 10/21/18  Yes Luking, Scott A, MD  ibuprofen (ADVIL) 200 MG tablet Take 200 mg by mouth every 6 (six) hours as needed for moderate pain.   Yes [provider]  latanoprost (XALATAN) 0.005 % ophthalmic solution Place 1 drop into the left eye at bedtime.  07/30/18 07/30/19 Yes [provider]  LORazepam (ATIVAN) 0.5 MG tablet Take one tablet po q 8 hrs prn anxiety Patient taking differently: Take 0.5 mg by mouth every 8 (eight) hours as needed for anxiety.  11/06/18  Yes Mikey Kirschner, MD  OXYGEN Inhale 2.5-2.75 L into the lungs daily as needed (for shortness of breath).    Yes [provider]  ALPRAZolam (XANAX) 0.5 MG tablet Take 0.5-1 tablets (0.25-0.5 mg total) by mouth 2 (two) times daily as needed for anxiety or sleep. Patient not taking: Reported on 11/15/2018 07/22/18   Kathyrn Drown, MD  colchicine 0.6 MG tablet Take 1 tablet (0.6 mg total) by mouth 2 (two) times daily. Patient not taking: Reported on 11/15/2018 08/17/18 11/15/18  Radene Gunning, NP  HYDROcodone-acetaminophen (NORCO/VICODIN) 5-325 MG tablet Take 1 tablet by mouth 4 (four) times daily as needed for moderate pain. Patient not taking: Reported on 11/15/2018 10/21/18   Kathyrn Drown, MD  HYDROcodone-acetaminophen (NORCO/VICODIN) 5-325 MG tablet Take 1 tablet by mouth 4 (four) times daily as needed for moderate pain Patient not taking: Reported on 11/15/2018 10/21/18   Kathyrn Drown, MD  pantoprazole (PROTONIX) 40 MG tablet Take 1 tablet (40 mg total) by mouth daily at 6 (six) AM. Patient not taking: Reported on 11/15/2018 08/17/18 11/15/18  Radene Gunning, NP  predniSONE (DELTASONE) 10 MG tablet Take 3 tablets qd for 3 days then take 2 tablets qd for 3 days then take  one tablet po qd for 2 days Patient not taking: Reported on 11/15/2018 11/06/18   Mikey Kirschner, MD    Review of Systems:  Constitutional:  No weight loss, night sweats, Fevers, chills, Head&Eyes: No headache.  No vision loss.  No eye pain or scotoma ENT:  No Difficulty swallowing,Tooth/dental problems,Sore throat,  No ear ache, post nasal drip,  Cardio-vascular:  No chest pain, Orthopnea, PND,dizziness, palpitations  GI:  No  abdominal pain, nausea, vomiting, diarrhea, loss of appetite, hematochezia, melena, heartburn, indigestion, Resp:   No coughing up of blood .No wheezing.No chest wall deformity  Skin:  no rash or lesions.  GU:  no dysuria, change in color  of urine, no urgency or frequency. No flank pain.  Musculoskeletal:  No joint pain or swelling. No decreased range of motion. No back pain.  Psych:  No change in mood or affect. No depression or anxiety. Neurologic: No headache, no dysesthesia, no focal weakness, no vision loss. No syncope  Physical Exam: Vitals:   12/03/2018 0900 11/20/2018 0930 11/17/2018 1000 11/15/2018 1045  BP: (!) 127/57 116/69 135/71   Pulse:      Resp: (!) 27 (!) 24 (!) 29 (!) 24  Temp:      TempSrc:      SpO2: 98% 91% 91% (!) 85%  Weight:      Height:       General:  A&O x 3, NAD, nontoxic, pleasant/cooperative Head/Eye: No conjunctival hemorrhage, no icterus, [place]/AT, No nystagmus ENT:  No icterus,  No thrush, good dentition, no pharyngeal exudate Neck:  No masses, no lymphadenpathy, no bruits CV:  RRR, no rub, no gallop, no S3 Lung:  Bilateral crackles. Bibasilar wheezing. Abdomen: soft/NT, +BS, nondistended, no peritoneal signs Ext: No cyanosis, No rashes, No petechiae, No lymphangitis, trace LE edema Neuro: CNII-XII intact, strength 4/5 in bilateral upper and lower extremities, no dysmetria  Labs on Admission:  Basic Metabolic Panel: Recent Labs  Lab 11/15/18 1402 12/07/2018 0909  NA 138 138  K 3.7 3.7  CL 104 104  CO2 26 25   GLUCOSE 109* 122*  BUN 11 15  CREATININE 0.75 0.80  CALCIUM 8.2* 8.3*   Liver Function Tests: Recent Labs  Lab 11/13/2018 0909  AST 38  ALT 28  ALKPHOS 105  BILITOT 5.9*  PROT 6.6  ALBUMIN 2.9*   No results for input(s): LIPASE, AMYLASE in the last 168 hours. No results for input(s): AMMONIA in the last 168 hours. CBC: Recent Labs  Lab 11/15/18 1402 11/18/2018 0909  WBC 12.3* 19.6*  NEUTROABS  --  16.0*  HGB 13.8 13.2  HCT 42.9 41.0  MCV 97.5 98.8  PLT 130* 126*   Coagulation Profile: Recent Labs  Lab 11/10/2018 0909  INR 1.8*   Cardiac Enzymes: No results for input(s): CKTOTAL, CKMB, CKMBINDEX, TROPONINI in the last 168 hours. BNP: Invalid input(s): POCBNP CBG: No results for input(s): GLUCAP in the last 168 hours. Urine analysis:    Component Value Date/Time   COLORURINE AMBER (A) 05/26/2016 0020   APPEARANCEUR CLEAR 05/26/2016 0020   LABSPEC 1.031 (H) 05/26/2016 0020   PHURINE 5.0 05/26/2016 0020   GLUCOSEU NEGATIVE 05/26/2016 0020   HGBUR NEGATIVE 05/26/2016 0020   BILIRUBINUR ++ 09/16/2016 1547   KETONESUR NEGATIVE 05/26/2016 0020   PROTEINUR 30 (A) 05/26/2016 0020   UROBILINOGEN 4.0 (H) 04/04/2012 0505   NITRITE NEGATIVE 05/26/2016 0020   LEUKOCYTESUR NEGATIVE 05/26/2016 0020   Sepsis Labs: '@[email or web address]' (procalcitonin:4,lacticidven:4) ) Recent Results (from the past 240 hour(s))  Blood Culture (routine x 2)     Status: None (Preliminary result)   Collection Time: 12/04/2018 10:22 AM   Specimen: BLOOD LEFT ARM  Result Value Ref Range Status   Specimen Description BLOOD LEFT ARM  Final   Special Requests   Final    BOTTLES DRAWN AEROBIC AND ANAEROBIC Blood Culture results may not be optimal due to an inadequate volume of blood received in culture bottles Performed at [facility], [address] [place], [place] [address]    Culture PENDING  Incomplete   Report Status PENDING  Incomplete  Blood Culture (routine x 2)     Status: None (Preliminary  result)  Collection Time: 11/17/2018 10:24 AM   Specimen: BLOOD LEFT ARM  Result Value Ref Range Status   Specimen Description BLOOD LEFT ARM  Final   Special Requests   Final    BOTTLES DRAWN AEROBIC AND ANAEROBIC Blood Culture results may not be optimal due to an inadequate volume of blood received in culture bottles Performed at [facility], [address] [place], [place] [address]    Culture PENDING  Incomplete   Report Status PENDING  Incomplete     Radiological Exams on Admission: Dg Chest 2 View  Result Date: 11/15/2018 CLINICAL DATA:  Patient with chest pain. EXAM: CHEST - 2 VIEW COMPARISON:  Chest radiograph 08/16/2018 FINDINGS: Monitoring leads overlie the patient. Stable cardiac and mediastinal contours. Similar-appearing diffuse bilateral interstitial pulmonary opacities. No definite superimposed pulmonary consolidation. No pleural effusion or pneumothorax. IMPRESSION: Similar-appearing diffuse bilateral interstitial pulmonary opacities compatible with interstitial lung disease. Electronically Signed   By: Lovey Newcomer M.D.   On: 11/15/2018 14:53   Dg Chest Portable 1 View  Result Date: 11/26/2018 CLINICAL DATA:  Worsening shortness of breath and weakness. History of pulmonary fibrosis. EXAM: PORTABLE CHEST 1 VIEW COMPARISON:  11/15/2018 FINDINGS: The cardiomediastinal silhouette is unchanged. There is a background of chronic interstitial lung disease. New/increased patchy opacities are present in the right greater than left lung bases and possibly left midlung. No pleural effusion or pneumothorax is identified. No acute osseous abnormality is seen. IMPRESSION: Chronic interstitial lung disease with new/increased patchy opacities in the right greater than left lung bases which may reflect superimposed pneumonia or edema. Electronically Signed   By: Logan Bores M.D.   On: 11/18/2018 09:39    EKG: Independently reviewed. Sinus, nonspecific STT changes    Time spent:60 minutes  Code Status:   FULL Family Communication: Spouse updated at bedside 11/9 Disposition Plan: expect 3-4 day hospitalization Consults called: cardiology DVT Prophylaxis: apixaban  Orson Eva, DO  [facility] Pager [phone]  If 7PM-7AM, please contact night-coverage [email or web address] Password [email or web address] 11/10/2018, 11:26 AM

## 2018-11-16 NOTE — Progress Notes (Signed)
Notified provider of need to draw repeat lactic acid in 2 hours and needs  fluid bolus of 3267 cc per sepsis protocal. since lactate trending up

## 2018-11-16 NOTE — ED Provider Notes (Signed)
[facility] EMERGENCY DEPARTMENT Provider Note   CSN: [id number] Arrival date & time: 11/15/2018  0818     History   Chief Complaint Chief Complaint  Patient presents with  . Shortness of Breath    HPI Curtis [name] is a 73 y.o. male.     Patient has a history of atrial fib and pulmonary fibrosis.  Patient complaint of shortness of breath  The history is provided by the patient. No language interpreter was used.  Shortness of Breath Severity:  Moderate Onset quality:  Sudden Timing:  Constant Progression:  Worsening Chronicity:  New Context: activity   Context: not fumes   Relieved by:  Nothing Worsened by:  Nothing Ineffective treatments:  None tried Associated symptoms: no abdominal pain, no chest pain, no cough, no headaches and no rash     Past Medical History:  Diagnosis Date  . Arthritis   . Asthma   . Atrial fibrillation ([place])   . Cataract   . Cervical spondylosis   . Chest pain    Emergency room October 24, 2011, no MI  //   Nuclear, October, 2013, adenosine, EF 61%, no scar or ischemia  . Dyslipidemia    Statin intolerant  . Dyspnea   . GERD (gastroesophageal reflux disease)   . Glaucoma   . Hearing loss   . Hypertension   . Hypertension   . Idiopathic pulmonary fibrosis ([place])   . Interstitial pulmonary fibrosis ([place]) 2012  . Left shoulder pain 2011  . Myocardial infarction ([place])   . Psoriasis   . Pulmonary fibrosis ([place])   . Statin intolerance     Patient Active Problem List   Diagnosis Date Noted  . Acute pericarditis   . Thrombocytopenia ([place]) 08/16/2018  . Glaucoma 08/16/2018  . Impingement syndrome of right shoulder 12/12/2016  . AC (acromioclavicular) arthritis 12/12/2016  . Partial tear of right rotator cuff 12/12/2016  . Chronic respiratory failure ([place]) 04/26/2016  . Hyperbilirubinemia 04/26/2016  . H/O asbestos exposure 03/15/2016  . Persistent atrial fibrillation 02/19/2016  . Atrial fibrillation, persistent 02/19/2016   . IPF (idiopathic pulmonary fibrosis) ([place]) 02/09/2016  . Incidental lung nodule, greater than or equal to 58mm 02/09/2016  . Bell's palsy 08/03/2015  . Insomnia 06/08/2015  . Hypertension 08/18/2014  . Dyslipidemia 08/18/2014  . Gastroesophageal reflux disease 08/18/2014  . Trigeminal neuralgia of right side of face 07/05/2014  . Right trigeminal neuralgia 04/01/2014  . Chronic back pain 03/01/2014  . Elevated transaminase level 06/25/2013  . Elevation of level of transaminase or lactic acid dehydrogenase (LDH) 06/25/2013  . Interstitial pulmonary disease, unspecified ([place]) 01/22/2013  . Obesity (BMI 30-39.9) 09/09/2012  . Chronic neck pain 09/09/2012  . Body mass index (BMI) greater than 30 in adult 09/09/2012  . Symptomatic cholelithiasis 04/14/2012  . Chest pain   . Statin intolerance   . INTERSTITIAL LUNG DISEASE 03/16/2010  . Psoriasis 03/16/2010    Past Surgical History:  Procedure Laterality Date  . ATRIAL FIBRILLATION ABLATION N/A 02/18/2018   Procedure: ATRIAL FIBRILLATION ABLATION;  Surgeon: Constance Haw, MD;  Location: [place] [facility];  Service: Cardiovascular;  Laterality: N/A;  . ATRIAL FIBRILLATION ABLATION N/A 07/17/2018   Procedure: ATRIAL FIBRILLATION ABLATION;  Surgeon: Constance Haw, MD;  Location: [place] [facility];  Service: Cardiovascular;  Laterality: N/A;  . BRAIN SURGERY    . CARDIOVERSION N/A 12/13/2015   Procedure: CARDIOVERSION;  Surgeon: Josue Hector, MD;  Location: [facility]OSCOPY;  Service: Cardiovascular;  Laterality: N/A;  .  CATARACT EXTRACTION W/PHACO Right 06/11/2012   Procedure: CATARACT EXTRACTION PHACO AND INTRAOCULAR LENS PLACEMENT (IOC);  Surgeon: Tonny Branch, MD;  Location: [facility];  Service: Ophthalmology;  Laterality: Right;  CDE:  14.43  . CATARACT EXTRACTION W/PHACO Left 06/29/2012   Procedure: CATARACT EXTRACTION PHACO AND INTRAOCULAR LENS PLACEMENT (IOC);  Surgeon: Tonny Branch, MD;  Location: [facility];  Service: Ophthalmology;   Laterality: Left;  CDE:14.11  . CHOLECYSTECTOMY  05/08/2012  . CHOLECYSTECTOMY N/A 05/08/2012   Procedure: LAPAROSCOPIC CHOLECYSTECTOMY;  Surgeon: Harl Bowie, MD;  Location: [facility];  Service: General;  Laterality: N/A;  . COLONOSCOPY    . LIVER BIOPSY N/A 05/08/2012   Procedure: LIVER BIOPSY;  Surgeon: Harl Bowie, MD;  Location: [facility];  Service: General;  Laterality: N/A;  . NECK SURGERY     cervical disc with cliip  . SHOULDER ARTHROSCOPY WITH DISTAL CLAVICLE RESECTION Right 12/12/2016   Procedure: RIGHT SHOULDER ARTHROSCOPY, SUBACROMIAL DECOMPRESSION, DISTAL CLAVICLE RESECTION, WITH POSSIBLE MINI OPEN ROTATOR CUFF REPAIR;  Surgeon: [name], MD;  Location: [facility];  Service: Orthopedics;  Laterality: Right;        Home Medications    Prior to Admission medications   Medication Sig Start Date End Date Taking? Authorizing Provider  albuterol (PROVENTIL HFA;VENTOLIN HFA) 108 (90 Base) MCG/ACT inhaler Inhale 2 puffs into the lungs every 6 (six) hours as needed for wheezing. 04/08/16  Yes Kathyrn Drown, MD  apixaban (ELIQUIS) 5 MG TABS tablet Take 1 tablet (5 mg total) by mouth 2 (two) times daily. 07/27/18  Yes Sherran Needs, NP  brimonidine (ALPHAGAN) 0.2 % ophthalmic solution Place 1 drop into both eyes 2 (two) times daily. 07/30/18 07/30/19 Yes [provider]  calcium carbonate (TUMS - DOSED IN MG ELEMENTAL CALCIUM) 500 MG chewable tablet Chew 1 tablet by mouth daily as needed for indigestion or heartburn.   Yes [provider]  diltiazem (CARTIA XT) 120 MG 24 hr capsule TAKE 1 CAPSULE(120 MG) BY MOUTH DAILY Patient taking differently: Take 120 mg by mouth every evening.  07/22/18  Yes Sherran Needs, NP  dorzolamide (TRUSOPT) 2 % ophthalmic solution Place 1 drop into the left eye 2 (two) times daily. 10/25/18  Yes [provider]  famotidine (PEPCID) 20 MG tablet Take 1 tablet (20 mg total) by mouth 2 (two) times daily. 11/15/18  Yes Khatri,  Hina, PA-C  fexofenadine (ALLEGRA) 180 MG tablet Take 180 mg by mouth daily as needed for allergies or rhinitis.   Yes [provider]  fluticasone (FLONASE) 50 MCG/ACT nasal spray Place 2 sprays into both nostrils daily. Patient taking differently: Place 1 spray into both nostrils daily.  01/02/16  Yes Kathyrn Drown, MD  HYDROcodone-acetaminophen (NORCO/VICODIN) 5-325 MG tablet Take 1 tablet by mouth 4 (four) times daily as needed for moderate pain 10/21/18  Yes Luking, Scott A, MD  ibuprofen (ADVIL) 200 MG tablet Take 200 mg by mouth every 6 (six) hours as needed for moderate pain.   Yes [provider]  latanoprost (XALATAN) 0.005 % ophthalmic solution Place 1 drop into the left eye at bedtime.  07/30/18 07/30/19 Yes [provider]  LORazepam (ATIVAN) 0.5 MG tablet Take one tablet po q 8 hrs prn anxiety Patient taking differently: Take 0.5 mg by mouth every 8 (eight) hours as needed for anxiety.  11/06/18  Yes Mikey Kirschner, MD  OXYGEN Inhale 2.5-2.75 L into the lungs daily as needed (for shortness of breath).  Yes [provider]  ALPRAZolam (XANAX) 0.5 MG tablet Take 0.5-1 tablets (0.25-0.5 mg total) by mouth 2 (two) times daily as needed for anxiety or sleep. Patient not taking: Reported on 11/15/2018 07/22/18   Kathyrn Drown, MD  colchicine 0.6 MG tablet Take 1 tablet (0.6 mg total) by mouth 2 (two) times daily. Patient not taking: Reported on 11/15/2018 08/17/18 11/15/18  Radene Gunning, NP  HYDROcodone-acetaminophen (NORCO/VICODIN) 5-325 MG tablet Take 1 tablet by mouth 4 (four) times daily as needed for moderate pain. Patient not taking: Reported on 11/15/2018 10/21/18   Kathyrn Drown, MD  HYDROcodone-acetaminophen (NORCO/VICODIN) 5-325 MG tablet Take 1 tablet by mouth 4 (four) times daily as needed for moderate pain Patient not taking: Reported on 11/15/2018 10/21/18   Kathyrn Drown, MD  pantoprazole (PROTONIX) 40 MG tablet Take 1 tablet (40 mg  total) by mouth daily at 6 (six) AM. Patient not taking: Reported on 11/15/2018 08/17/18 11/15/18  Radene Gunning, NP  predniSONE (DELTASONE) 10 MG tablet Take 3 tablets qd for 3 days then take 2 tablets qd for 3 days then take one tablet po qd for 2 days Patient not taking: Reported on 11/15/2018 11/06/18   Mikey Kirschner, MD    Family History Family History  Problem Relation Age of Onset  . Hypertension Mother   . Congestive Heart Failure Mother   . COPD Mother   . Stroke Father   . Breast cancer Sister     Social History Social History   Tobacco Use  . Smoking status: Former Smoker    Packs/day: 0.50    Years: 52.00    Pack years: 26.00    Types: Cigarettes    Quit date: 09/08/2003    Years since quitting: 15.2  . Smokeless tobacco: Never Used  Substance Use Topics  . Alcohol use: No    Comment: quit drinking 2004  . Drug use: No     Allergies   Pirfenidone, Acyclovir and related, Oxycodone, Pravastatin, Simvastatin, Prednisone, and Tegretol [carbamazepine]   Review of Systems Review of Systems  Constitutional: Negative for appetite change and fatigue.  HENT: Negative for congestion, ear discharge and sinus pressure.   Eyes: Negative for discharge.  Respiratory: Positive for shortness of breath. Negative for cough.   Cardiovascular: Negative for chest pain.  Gastrointestinal: Negative for abdominal pain and diarrhea.  Genitourinary: Negative for frequency and hematuria.  Musculoskeletal: Negative for back pain.  Skin: Negative for rash.  Neurological: Negative for seizures and headaches.  Psychiatric/Behavioral: Negative for hallucinations.  All other systems reviewed and are negative.    Physical Exam Updated Vital Signs BP 135/71   Pulse (!) 102   Temp 97.7 F (36.5 C) (Oral)   Resp (!) 24   Ht 6\' 1"  (1.854 m)   Wt 108.9 kg   SpO2 (!) 85%   BMI 31.66 kg/m   Physical Exam Vitals signs and nursing note reviewed.  Constitutional:      Appearance:  He is well-developed.  HENT:     Head: Normocephalic.     Nose: Nose normal.  Eyes:     General: No scleral icterus.    Conjunctiva/sclera: Conjunctivae normal.  Neck:     Musculoskeletal: Neck supple.     Thyroid: No thyromegaly.  Cardiovascular:     Rate and Rhythm: Normal rate and regular rhythm.     Heart sounds: No murmur. No friction rub. No gallop.   Pulmonary:     Breath sounds:  No stridor. No wheezing or rales.  Chest:     Chest wall: No tenderness.  Abdominal:     General: There is no distension.     Tenderness: There is no abdominal tenderness. There is no rebound.  Musculoskeletal: Normal range of motion.  Lymphadenopathy:     Cervical: No cervical adenopathy.  Skin:    Findings: No erythema or rash.  Neurological:     Mental Status: He is oriented to person, place, and time.     Motor: No abnormal muscle tone.     Coordination: Coordination normal.  Psychiatric:        Behavior: Behavior normal.      ED Treatments / Results  Labs (all labs ordered are listed, but only abnormal results are displayed) Labs Reviewed  CBC WITH DIFFERENTIAL/PLATELET - Abnormal; Notable for the following components:      Result Value   WBC 19.6 (*)    RBC 4.15 (*)    Platelets 126 (*)    Neutro Abs 16.0 (*)    Abs Immature Granulocytes 0.17 (*)    All other components within normal limits  COMPREHENSIVE METABOLIC PANEL - Abnormal; Notable for the following components:   Glucose, Bld 122 (*)    Calcium 8.3 (*)    Albumin 2.9 (*)    Total Bilirubin 5.9 (*)    All other components within normal limits  BRAIN NATRIURETIC PEPTIDE - Abnormal; Notable for the following components:   B Natriuretic Peptide 295.0 (*)    All other components within normal limits  LACTIC ACID, PLASMA - Abnormal; Notable for the following components:   Lactic Acid, Venous 3.3 (*)    All other components within normal limits  APTT - Abnormal; Notable for the following components:   aPTT 39 (*)     All other components within normal limits  PROTIME-INR - Abnormal; Notable for the following components:   Prothrombin Time 20.9 (*)    INR 1.8 (*)    All other components within normal limits  SARS CORONAVIRUS 2 (TAT 6-24 HRS)  CULTURE, BLOOD (ROUTINE X 2)  CULTURE, BLOOD (ROUTINE X 2)  URINE CULTURE  LACTIC ACID, PLASMA  URINALYSIS, ROUTINE W REFLEX MICROSCOPIC  TROPONIN I (HIGH SENSITIVITY)  TROPONIN I (HIGH SENSITIVITY)    EKG None  Radiology Dg Chest 2 View  Result Date: 11/15/2018 CLINICAL DATA:  Patient with chest pain. EXAM: CHEST - 2 VIEW COMPARISON:  Chest radiograph 08/16/2018 FINDINGS: Monitoring leads overlie the patient. Stable cardiac and mediastinal contours. Similar-appearing diffuse bilateral interstitial pulmonary opacities. No definite superimposed pulmonary consolidation. No pleural effusion or pneumothorax. IMPRESSION: Similar-appearing diffuse bilateral interstitial pulmonary opacities compatible with interstitial lung disease. Electronically Signed   By: Lovey Newcomer M.D.   On: 11/15/2018 14:53   Dg Chest Portable 1 View  Result Date: 11/13/2018 CLINICAL DATA:  Worsening shortness of breath and weakness. History of pulmonary fibrosis. EXAM: PORTABLE CHEST 1 VIEW COMPARISON:  11/15/2018 FINDINGS: The cardiomediastinal silhouette is unchanged. There is a background of chronic interstitial lung disease. New/increased patchy opacities are present in the right greater than left lung bases and possibly left midlung. No pleural effusion or pneumothorax is identified. No acute osseous abnormality is seen. IMPRESSION: Chronic interstitial lung disease with new/increased patchy opacities in the right greater than left lung bases which may reflect superimposed pneumonia or edema. Electronically Signed   By: Logan Bores M.D.   On: 11/26/2018 09:39    Procedures Procedures (including critical care time)  Medications  Ordered in ED Medications  methylPREDNISolone sodium  succinate (SOLU-MEDROL) 125 mg/2 mL injection 125 mg (125 mg Intravenous Not Given 12/01/2018 0907)  albuterol (VENTOLIN HFA) 108 (90 Base) MCG/ACT inhaler 6 puff (6 puffs Inhalation Given 12/03/2018 0936)  piperacillin-tazobactam (ZOSYN) IVPB 3.375 g (3.375 g Intravenous New Bag/Given 12/02/2018 1037)  vancomycin (VANCOCIN) IVPB 1000 mg/200 mL premix (has no administration in time range)  magnesium sulfate IVPB 2 g 50 mL (0 g Intravenous Stopped 11/13/2018 1036)  furosemide (LASIX) injection 40 mg (40 mg Intravenous Given 12/07/2018 1033)  HYDROcodone-acetaminophen (NORCO/VICODIN) 5-325 MG per tablet 1 tablet (1 tablet Oral Given 12/07/2018 1041)     Initial Impression / Assessment and Plan / ED Course  I have reviewed the triage vital signs and the nursing notes.  Pertinent labs & imaging results that were available during my care of the patient were reviewed by me and considered in my medical decision making (see chart for details).        CRITICAL CARE Performed by: Milton Ferguson Total critical care time: 45 minutes Critical care time was exclusive of separately billable procedures and treating other patients. Critical care was necessary to treat or prevent imminent or life-threatening deterioration. Critical care was time spent personally by me on the following activities: development of treatment plan with patient and/or surrogate as well as nursing, discussions with consultants, evaluation of patient's response to treatment, examination of patient, obtaining history from patient or surrogate, ordering and performing treatments and interventions, ordering and review of laboratory studies, ordering and review of radiographic studies, pulse oximetry and re-evaluation of patient's condition. Chest x-ray shows either pneumonia or congestive heart failure.  We will place him on antibiotics and give him some Lasix and get him admitted to medicine.  Cardiology consult pending Final Clinical Impressions(s) /  ED Diagnoses   Final diagnoses:  None    ED Discharge Orders    None       Milton Ferguson, MD 11/30/2018 1106

## 2018-11-16 NOTE — ED Notes (Signed)
Date and time results received: 11/20/2018 11:00 AM   Test: 3.3 Critical Value: lactic   Name of Provider Notified: Dr Roderic Palau  Orders Received? Or Actions Taken?:

## 2018-11-17 ENCOUNTER — Inpatient Hospital Stay (HOSPITAL_COMMUNITY): Payer: PPO

## 2018-11-17 LAB — COMPREHENSIVE METABOLIC PANEL
ALT: 26 U/L (ref 0–44)
AST: 53 U/L — ABNORMAL HIGH (ref 15–41)
Albumin: 3 g/dL — ABNORMAL LOW (ref 3.5–5.0)
Alkaline Phosphatase: 104 U/L (ref 38–126)
Anion gap: 8 (ref 5–15)
BUN: 23 mg/dL (ref 8–23)
CO2: 27 mmol/L (ref 22–32)
Calcium: 9.1 mg/dL (ref 8.9–10.3)
Chloride: 104 mmol/L (ref 98–111)
Creatinine, Ser: 0.83 mg/dL (ref 0.61–1.24)
GFR calc Af Amer: >60 mL/min (ref >60)
GFR calc non Af Amer: >60 mL/min (ref >60)
Glucose, Bld: 162 mg/dL — ABNORMAL HIGH (ref 70–99)
Potassium: 4 mmol/L (ref 3.5–5.1)
Sodium: 139 mmol/L (ref 135–145)
Total Bilirubin: 4.1 mg/dL — ABNORMAL HIGH (ref 0.3–1.2)
Total Protein: 7 g/dL (ref 6.5–8.1)

## 2018-11-17 LAB — BLOOD GAS, ARTERIAL
Acid-Base Excess: 3.1 mmol/L — ABNORMAL HIGH (ref 0.0–2.0)
Bicarbonate: 26.9 mmol/L (ref 20.0–28.0)
FIO2: 60
O2 Saturation: 93.3 %
Patient temperature: 37.2
pCO2 arterial: 42.3 mmHg (ref 32.0–48.0)
pH, Arterial: 7.424 (ref 7.350–7.450)
pO2, Arterial: 69.5 mmHg — ABNORMAL LOW (ref 83.0–108.0)

## 2018-11-17 LAB — CBC
HCT: 41.8 % (ref 39.0–52.0)
Hemoglobin: 13.5 g/dL (ref 13.0–17.0)
MCH: 31 pg (ref 26.0–34.0)
MCHC: 32.3 g/dL (ref 30.0–36.0)
MCV: 96.1 fL (ref 80.0–100.0)
Platelets: 121 10*3/uL — ABNORMAL LOW (ref 150–400)
RBC: 4.35 MIL/uL (ref 4.22–5.81)
RDW: 12.9 % (ref 11.5–15.5)
WBC: 30.1 10*3/uL — ABNORMAL HIGH (ref 4.0–10.5)
nRBC: 0 % (ref 0.0–0.2)

## 2018-11-17 LAB — PROCALCITONIN: Procalcitonin: 0.56 ng/mL

## 2018-11-17 MED ORDER — HYDROCODONE-ACETAMINOPHEN 5-325 MG PO TABS
1.0000 | ORAL_TABLET | Freq: Four times a day (QID) | ORAL | Status: DC | PRN
  Administered 2018-11-17 – 2018-11-18 (×5): 1 via ORAL
  Filled 2018-11-17 (×5): qty 1

## 2018-11-17 MED ORDER — LORAZEPAM 2 MG/ML IJ SOLN
0.5000 mg | Freq: Four times a day (QID) | INTRAMUSCULAR | Status: DC | PRN
  Administered 2018-11-17 – 2018-11-19 (×3): 0.5 mg via INTRAVENOUS
  Filled 2018-11-17 (×3): qty 1

## 2018-11-17 MED ORDER — SODIUM CHLORIDE 0.9 % IV SOLN
500.0000 mg | INTRAVENOUS | Status: DC
  Administered 2018-11-17 – 2018-11-21 (×5): 500 mg via INTRAVENOUS
  Filled 2018-11-17 (×6): qty 500

## 2018-11-17 MED ORDER — CHLORHEXIDINE GLUCONATE CLOTH 2 % EX PADS
6.0000 | MEDICATED_PAD | Freq: Every day | CUTANEOUS | Status: DC
  Administered 2018-11-18 – 2018-11-26 (×8): 6 via TOPICAL

## 2018-11-17 NOTE — Progress Notes (Signed)
Pt taken off BIPAP to give a break and placed on 15L high flow and NRB to keep sats up.  Pt is coughing of moderate amount of thick bloody secretions.  Wishes to stay off BIPAP for a little while.  RT will continue to monitor.

## 2018-11-17 NOTE — Progress Notes (Signed)
Pt placed back on BIPAP

## 2018-11-17 NOTE — Progress Notes (Addendum)
PROGRESS NOTE  Curtis [name] ELT:[id number] DOB: January 15, 1945 DOA: 11/09/2018 PCP: Kathyrn Drown, MD  Brief History:  73 y.o. male with medical history of IPF, persistent atrial fib s/p ablation 02/18/2018 and 07/17/2018, hypertension, hyperlipidemia, coronary disease, and thrombocytopenia presenting with shortness of breath that began on 11/14/2018 that was significantly worsened in the morning of 12/05/2018.  Patient actually visited emergency department on 11/15/2018 secondary to having some left-sided chest pain.  It was felt this was likely musculoskeletal.  His troponins were negative at that time.  Admission was advised at that time, but the patient deferred.  The patient stated he had some shortness of breath at that time when he was in the emergency department, but has significantly worsened over the last 24 hours.   Notably, the patient had hospital admit from 08/16/2018 to 08/17/2018 for chest pain.  He was seen by cardiology who felt the patient had pericarditis at that time.  He has finished 3 months of colchicine.  He saw his primary care provider approximately 2 weeks prior to this admission.  He was felt to have had rhinosinusitis and possibly an exacerbation of his IPF.  He was placed on antibiotics as well as 5 days of prednisone which he finished approximately 1 week prior to this admission.  In the emergency department, the patient was afebrile hemodynamically stable saturating 70% on room air.  He was placed on nonrebreather.  However, the patient desaturated into the 70s with minimal exertion.  I have asked the patient to be placed on BiPAP at this time.  Chest x-ray showed bilateral interstitial and lower lobe opacities.  BMP was unremarkable.  AST 38, ALT 28, alk phosphatase 105, total bilirubin 5.9.  WBC was 19.5 with hemoglobin 13.2, platelets 126.  Troponin was 9.  BNP 295.  Patient was given Solu-Medrol, furosemide, vancomycin, and Zosyn.  Assessment/Plan: Acute on  chronic respiratory failure with hypoxia -Secondary to pneumonia and exacerbation of his IPF -11/10--pt remains on BiPAP -11/10--ABG--7.42/42/69/26 (0.6) -Wean oxygen back to baseline as tolerated -SARS-CoV2--neg -check viral respiratory panel--neg  Healthcare associated pneumonia/lobar pneumonia -Started vancomycin and cefepime initially -Check procalcitonin 0.33>>>0.56 -Follow blood cultures -MRSA neg-->d/c vanco -add azithro  Sepsis -Present on admission -Secondary to pneumonia -Judicious IV fluids -Lactic acid peaked 3.3 -Check procalcitonin 0.33>>>0.56 -Follow blood culture -Continue vancomycin and cefepime  Lactic acidosis -I feel that most of his lactic acidosis is due to his hypoxia -Judicious IV fluids  Exacerbation of IPF -Continue IV steroids -Continue Pulmicort -Continue Brovana  -Continue Duo nebs  Persistent atrial fibrillation -Status post ablation 02/18/2018 and 07/17/2018 -Continue apixaban -Continue diltiazem XT  Coronary artery disease -No chest pain presently -personally reviewed CXR-increased interstitial markings, coarsening -personally reviewed EKG--sinus, nonspecific STT changes -troponin 9>>>10 -repeat EKG  Anxiety and Chronic Pain -this is partly driving his dyspnea this am -11/10 am--pt was belligerent to staff and myself -start IV ativan 0.38m prn -restart home hydrocodone -PMP Aware--queried--pt receives monthly Norco #120;  Also had ativan 0.536m#30 on 10/22/18;  Previously on alprazolam last filled 07/14/18  Hyperbilirubinemia -previous CTs suggest patient has liver cirrhosis -RUQ [place] LFTs       Disposition Plan:   Remain in SDU Family Communication:   Attempted to call wife x 2, could not leave VM  Consultants:  none  Code Status:  FULL  DVT Prophylaxis: apixaban   Procedures: As Listed in Progress Note Above  Antibiotics: Cefepime 11/9>>> azithro 11/10>>> vanco  11/9>>>11/10    The patient is  critically ill with multiple organ systems failure and requires high complexity decision making for assessment and support, frequent evaluation and titration of therapies, application of advanced monitoring technologies and extensive interpretation of multiple databases.  Critical care time - 40 mins.     Subjective: Pt belliergent and cursing at me.  Complains of sob and cp.  Denies n/v/d,abd pain, f/c.  Objective: Vitals:   11/17/18 0400 11/17/18 0600 11/17/18 0700 11/17/18 0850  BP: (!) 149/92 (!) 148/85 (!) 151/96   Pulse: 98 100 100   Resp: (!) 27 (!) 26 (!) 26   Temp: 98.9 F (37.2 C)     TempSrc: Axillary     SpO2: 91% (!) 89% 91% 92%  Weight: 100.2 kg     Height:        Intake/Output Summary (Last 24 hours) at 11/17/2018 1041 Last data filed at 11/17/2018 0334 Gross per 24 hour  Intake 850 ml  Output 2050 ml  Net -1200 ml   Weight change:  Exam:   General:  Pt is alert, follows commands appropriately, not in acute distress  HEENT: No icterus, No thrush, No neck mass, [place]/AT  Cardiovascular: RRR, S1/S2, no rubs, no gallops  Respiratory: bilateral crackles, no wheeze  Abdomen: Soft/+BS, non tender, non distended, no guarding  Extremities: No edema, No lymphangitis, No petechiae, No rashes, no synovitis   Data Reviewed: I have personally reviewed following labs and imaging studies Basic Metabolic Panel: Recent Labs  Lab 11/15/18 1402 12/04/2018 0909 11/17/18 0418  NA 138 138 139  K 3.7 3.7 4.0  CL 104 104 104  CO2 _0 GLUCOSE 109* 122* 162*  BUN _1 CREATININE 0.75 0.80 0.83  CALCIUM 8.2* 8.3* 9.1   Liver Function Tests: Recent Labs  Lab 11/15/2018 0909 11/17/18 0418  AST 38 53*  ALT 28 26  ALKPHOS 105 104  BILITOT 5.9* 4.1*  PROT 6.6 7.0  ALBUMIN 2.9* 3.0*   No results for input(s): LIPASE, AMYLASE in the last 168 hours. No results for input(s): AMMONIA in the last 168 hours. Coagulation Profile: Recent Labs  Lab 12/05/2018  0909  INR 1.8*   CBC: Recent Labs  Lab 11/15/18 1402 11/11/2018 0909 11/17/18 0418  WBC 12.3* 19.6* 30.1*  NEUTROABS  --  16.0*  --   HGB 13.8 13.2 13.5  HCT 42.9 41.0 41.8  MCV 97.5 98.8 96.1  PLT 130* 126* 121*   Cardiac Enzymes: No results for input(s): CKTOTAL, CKMB, CKMBINDEX, TROPONINI in the last 168 hours. BNP: Invalid input(s): POCBNP CBG: No results for input(s): GLUCAP in the last 168 hours. HbA1C: No results for input(s): HGBA1C in the last 72 hours. Urine analysis:    Component Value Date/Time   COLORURINE YELLOW 12/03/2018 1745   APPEARANCEUR CLEAR 11/22/2018 1745   LABSPEC 1.004 (L) 11/25/2018 1745   PHURINE 5.0 11/15/2018 1745   GLUCOSEU NEGATIVE 11/24/2018 1745   HGBUR MODERATE (A) 12/07/2018 1745   BILIRUBINUR NEGATIVE 11/17/2018 1745   BILIRUBINUR ++ 09/16/2016 1547   KETONESUR NEGATIVE 11/13/2018 1745   PROTEINUR NEGATIVE 12/01/2018 1745   UROBILINOGEN 4.0 (H) 04/04/2012 0505   NITRITE NEGATIVE 11/30/2018 1745   LEUKOCYTESUR NEGATIVE 12/01/2018 1745   Sepsis Labs: _2 (procalcitonin:4,lacticidven:4) ) Recent Results (from the past 240 hour(s))  SARS CORONAVIRUS 2 (Tionne Carelli 6-24 HRS) Nasopharyngeal Nasopharyngeal Swab     Status: None   Collection Time: 11/29/2018  8:51 AM   Specimen: Nasopharyngeal Swab  Result Value Ref Range Status   SARS Coronavirus 2 NEGATIVE NEGATIVE Final    Comment: (NOTE) SARS-CoV-2 target nucleic acids are NOT DETECTED. The SARS-CoV-2 RNA is generally detectable in upper and lower respiratory specimens during the acute phase of infection. Negative results do not preclude SARS-CoV-2 infection, do not rule out co-infections with other pathogens, and should not be used as the sole basis for treatment or other patient management decisions. Negative results must be combined with clinical observations, patient history, and epidemiological information. The expected result is Negative. Fact Sheet for Patients:  SugarRoll.be Fact Sheet for Healthcare Providers: https://www.woods-mathews.com/ This test is not yet approved or cleared by the [place] FDA and  has been authorized for detection and/or diagnosis of SARS-CoV-2 by FDA under an Emergency Use Authorization (EUA). This EUA will remain  in effect (meaning this test can be used) for the duration of the COVID-19 declaration under Section 56 4(b)(1) of the Act, 21 U.S.C. section 360bbb-3(b)(1), unless the authorization is terminated or revoked sooner. Performed at [facility], [place] [address], [place], [place] [address]   Blood Culture (routine x 2)     Status: None (Preliminary result)   Collection Time: 11/15/2018 10:22 AM   Specimen: BLOOD LEFT ARM  Result Value Ref Range Status   Specimen Description BLOOD LEFT ARM  Final   Special Requests   Final    BOTTLES DRAWN AEROBIC AND ANAEROBIC Blood Culture results may not be optimal due to an inadequate volume of blood received in culture bottles   Culture   Final    NO GROWTH < 24 HOURS Performed at [facility], [address], [place], [place] [address]    Report Status PENDING  Incomplete  Blood Culture (routine x 2)     Status: None (Preliminary result)   Collection Time: 12/07/2018 10:24 AM   Specimen: BLOOD LEFT ARM  Result Value Ref Range Status   Specimen Description BLOOD LEFT ARM  Final   Special Requests   Final    BOTTLES DRAWN AEROBIC AND ANAEROBIC Blood Culture results may not be optimal due to an inadequate volume of blood received in culture bottles   Culture   Final    NO GROWTH < 24 HOURS Performed at [facility], [address], [place], [place] [address]    Report Status PENDING  Incomplete  Respiratory Panel by PCR     Status: None   Collection Time: 11/25/2018  2:40 PM   Specimen: Nasopharyngeal Swab; Respiratory  Result Value Ref Range Status   Adenovirus NOT DETECTED NOT DETECTED Final   Coronavirus 229E NOT  DETECTED NOT DETECTED Final    Comment: (NOTE) The Coronavirus on the Respiratory Panel, DOES NOT test for the novel  Coronavirus (2019 nCoV)    Coronavirus HKU1 NOT DETECTED NOT DETECTED Final   Coronavirus NL63 NOT DETECTED NOT DETECTED Final   Coronavirus OC43 NOT DETECTED NOT DETECTED Final   Metapneumovirus NOT DETECTED NOT DETECTED Final   Rhinovirus / Enterovirus NOT DETECTED NOT DETECTED Final   Influenza A NOT DETECTED NOT DETECTED Final   Influenza B NOT DETECTED NOT DETECTED Final   Parainfluenza Virus 1 NOT DETECTED NOT DETECTED Final   Parainfluenza Virus 2 NOT DETECTED NOT DETECTED Final   Parainfluenza Virus 3 NOT DETECTED NOT DETECTED Final   Parainfluenza Virus 4 NOT DETECTED NOT DETECTED Final   Respiratory Syncytial Virus NOT DETECTED NOT DETECTED Final   Bordetella pertussis NOT DETECTED NOT DETECTED Final  Chlamydophila pneumoniae NOT DETECTED NOT DETECTED Final   Mycoplasma pneumoniae NOT DETECTED NOT DETECTED Final    Comment: Performed at [facility], [place] [address] [place], [place] [address]  MRSA PCR Screening     Status: None   Collection Time: 12/06/2018  2:40 PM   Specimen: Nasopharyngeal  Result Value Ref Range Status   MRSA by PCR NEGATIVE NEGATIVE Final    Comment:        The GeneXpert MRSA Assay (FDA approved for NASAL specimens only), is one component of a comprehensive MRSA colonization surveillance program. It is not intended to diagnose MRSA infection nor to guide or monitor treatment for MRSA infections. Performed at [facility], [address] [place], [place] [address]      Scheduled Meds: . apixaban  5 mg Oral BID  . arformoterol  15 mcg Nebulization BID  . brimonidine  1 drop Both Eyes BID  . budesonide (PULMICORT) nebulizer solution  0.5 mg Nebulization BID  . chlorhexidine  15 mL Mouth Rinse BID  . Chlorhexidine Gluconate Cloth  6 each Topical Daily  . diltiazem  120 mg Oral QPM  . dorzolamide  1 drop Left Eye BID  .  ipratropium-albuterol  3 mL Nebulization Q4H  . latanoprost  1 drop Left Eye QHS  . mouth rinse  15 mL Mouth Rinse q12n4p  . methylPREDNISolone (SOLU-MEDROL) injection  125 mg Intravenous Once  . methylPREDNISolone (SOLU-MEDROL) injection  60 mg Intravenous Q6H   Continuous Infusions: . ceFEPime (MAXIPIME) IV 2 g (11/17/18 0537)    Procedures/Studies: Dg Chest 2 View  Result Date: 11/15/2018 CLINICAL DATA:  Patient with chest pain. EXAM: CHEST - 2 VIEW COMPARISON:  Chest radiograph 08/16/2018 FINDINGS: Monitoring leads overlie the patient. Stable cardiac and mediastinal contours. Similar-appearing diffuse bilateral interstitial pulmonary opacities. No definite superimposed pulmonary consolidation. No pleural effusion or pneumothorax. IMPRESSION: Similar-appearing diffuse bilateral interstitial pulmonary opacities compatible with interstitial lung disease. Electronically Signed   By: Lovey Newcomer M.D.   On: 11/15/2018 14:53   Dg Chest Portable 1 View  Result Date: 11/09/2018 CLINICAL DATA:  Worsening shortness of breath and weakness. History of pulmonary fibrosis. EXAM: PORTABLE CHEST 1 VIEW COMPARISON:  11/15/2018 FINDINGS: The cardiomediastinal silhouette is unchanged. There is a background of chronic interstitial lung disease. New/increased patchy opacities are present in the right greater than left lung bases and possibly left midlung. No pleural effusion or pneumothorax is identified. No acute osseous abnormality is seen. IMPRESSION: Chronic interstitial lung disease with new/increased patchy opacities in the right greater than left lung bases which may reflect superimposed pneumonia or edema. Electronically Signed   By: Logan Bores M.D.   On: 11/12/2018 09:39    Orson Eva, DO  [facility] Pager [phone]  If 7PM-7AM, please contact night-coverage [email or web address] Password [email or web address] 11/17/2018, 10:41 AM   LOS: 1 day

## 2018-11-18 ENCOUNTER — Encounter (HOSPITAL_COMMUNITY): Payer: Self-pay | Admitting: Family Medicine

## 2018-11-18 ENCOUNTER — Telehealth: Payer: Self-pay | Admitting: Family Medicine

## 2018-11-18 LAB — COMPREHENSIVE METABOLIC PANEL
ALT: 23 U/L (ref 0–44)
AST: 53 U/L — ABNORMAL HIGH (ref 15–41)
Albumin: 2.8 g/dL — ABNORMAL LOW (ref 3.5–5.0)
Alkaline Phosphatase: 95 U/L (ref 38–126)
Anion gap: 9 (ref 5–15)
BUN: 34 mg/dL — ABNORMAL HIGH (ref 8–23)
CO2: 26 mmol/L (ref 22–32)
Calcium: 9.1 mg/dL (ref 8.9–10.3)
Chloride: 107 mmol/L (ref 98–111)
Creatinine, Ser: 0.83 mg/dL (ref 0.61–1.24)
GFR calc Af Amer: >60 mL/min (ref >60)
GFR calc non Af Amer: >60 mL/min (ref >60)
Glucose, Bld: 141 mg/dL — ABNORMAL HIGH (ref 70–99)
Potassium: 3.9 mmol/L (ref 3.5–5.1)
Sodium: 142 mmol/L (ref 135–145)
Total Bilirubin: 3.1 mg/dL — ABNORMAL HIGH (ref 0.3–1.2)
Total Protein: 6.5 g/dL (ref 6.5–8.1)

## 2018-11-18 LAB — CBC WITH DIFFERENTIAL/PLATELET
Abs Immature Granulocytes: 0.34 10*3/uL — ABNORMAL HIGH (ref 0.00–0.07)
Basophils Absolute: 0.1 10*3/uL (ref 0.0–0.1)
Basophils Relative: 0 %
Eosinophils Absolute: 0 10*3/uL (ref 0.0–0.5)
Eosinophils Relative: 0 %
HCT: 39.3 % (ref 39.0–52.0)
Hemoglobin: 12.5 g/dL — ABNORMAL LOW (ref 13.0–17.0)
Immature Granulocytes: 1 %
Lymphocytes Relative: 2 %
Lymphs Abs: 0.7 10*3/uL (ref 0.7–4.0)
MCH: 31.2 pg (ref 26.0–34.0)
MCHC: 31.8 g/dL (ref 30.0–36.0)
MCV: 98 fL (ref 80.0–100.0)
Monocytes Absolute: 0.5 10*3/uL (ref 0.1–1.0)
Monocytes Relative: 2 %
Neutro Abs: 25.7 10*3/uL — ABNORMAL HIGH (ref 1.7–7.7)
Neutrophils Relative %: 95 %
Platelets: 134 10*3/uL — ABNORMAL LOW (ref 150–400)
RBC: 4.01 MIL/uL — ABNORMAL LOW (ref 4.22–5.81)
RDW: 13.2 % (ref 11.5–15.5)
WBC: 27.3 10*3/uL — ABNORMAL HIGH (ref 4.0–10.5)
nRBC: 0 % (ref 0.0–0.2)

## 2018-11-18 LAB — HEMOGLOBIN A1C
Hgb A1c MFr Bld: 4.9 % (ref 4.8–5.6)
Mean Plasma Glucose: 93.93 mg/dL

## 2018-11-18 LAB — URINE CULTURE: Culture: NO GROWTH

## 2018-11-18 LAB — PROCALCITONIN: Procalcitonin: 0.31 ng/mL

## 2018-11-18 MED ORDER — IPRATROPIUM BROMIDE 0.02 % IN SOLN
0.5000 mg | Freq: Three times a day (TID) | RESPIRATORY_TRACT | Status: DC
  Administered 2018-11-18 – 2018-11-19 (×2): 0.5 mg via RESPIRATORY_TRACT
  Filled 2018-11-18 (×2): qty 2.5

## 2018-11-18 MED ORDER — DILTIAZEM HCL 25 MG/5ML IV SOLN
10.0000 mg | INTRAVENOUS | Status: DC | PRN
  Administered 2018-11-18 – 2018-11-19 (×2): 10 mg via INTRAVENOUS
  Filled 2018-11-18 (×3): qty 5

## 2018-11-18 MED ORDER — LEVALBUTEROL HCL 0.63 MG/3ML IN NEBU
0.6300 mg | INHALATION_SOLUTION | RESPIRATORY_TRACT | Status: DC
  Administered 2018-11-18 – 2018-11-19 (×4): 0.63 mg via RESPIRATORY_TRACT
  Filled 2018-11-18 (×4): qty 3

## 2018-11-18 NOTE — Telephone Encounter (Signed)
Pt is in ICU at [facility] due to pneumonia. On BYPAP machine and unable to come off the BYPAP machine. Wife is wanting to try to transfer him to [facility]. Pt wife has now spoke with doctor at [facility] and expressed her opinion about being moved to [facility] due to regular heart and lung doctors being there. Pt wife states that doctor at [facility] says it may be a few days to both hospitals being full.

## 2018-11-18 NOTE — Progress Notes (Signed)
Pt taken off bipap and place on 15L [place] for a few minutes for oral care and to give medications. However, during this time he had a dry cough and desaturated down to 73%. Pt also stated he needed bipap back on. BIPAP placed back on and saturations came back up to 94% on 70 % FiO2

## 2018-11-18 NOTE — Telephone Encounter (Signed)
I returned a phone call to the wife-no answer currently

## 2018-11-18 NOTE — Care Management Important Message (Signed)
Important Message  Patient Details  Name: Curtis [name] MRN: [id number] Date of Birth: 26-Dec-1945   Medicare Important Message Given:  Yes     Tommy Medal 11/18/2018, 2:57 PM

## 2018-11-18 NOTE — Telephone Encounter (Signed)
Patient's wife Bethena Roys) called wanting to speak with provider about some concerns about patient care in the hospital. He is in the hospital with pneumonia. [phone]

## 2018-11-18 NOTE — Progress Notes (Signed)
PROGRESS NOTE  Curtis Cochrane WEX:[id number] DOB: [birth year] DOA: 11/13/2018 PCP: Kathyrn Drown, MD  Brief History:  73 y.o. male with medical history of IPF, persistent atrial fib s/p ablation 02/18/2018 and 07/17/2018, hypertension, hyperlipidemia, coronary disease, and thrombocytopenia presenting with shortness of breath that began on 11/14/2018 that was significantly worsened in the morning of 12/07/2018.  Patient actually visited emergency department on 11/15/2018 secondary to having some left-sided chest pain.  It was felt this was likely musculoskeletal.  His troponins were negative at that time.  Admission was advised at that time, but the patient deferred.  The patient stated he had some shortness of breath at that time when he was in the emergency department, but has significantly worsened over the last 24 hours.   Notably, the patient had hospital admit from 08/16/2018 to 08/17/2018 for chest pain.  He was seen by cardiology who felt the patient had pericarditis at that time.  He has finished 3 months of colchicine.  He saw his primary care provider approximately 2 weeks prior to this admission.  He was felt to have had rhinosinusitis and possibly an exacerbation of his IPF.  He was placed on antibiotics as well as 5 days of prednisone which he finished approximately 1 week prior to this admission.  In the emergency department, the patient was afebrile hemodynamically stable saturating 70% on room air.  He was placed on nonrebreather.  However, the patient desaturated into the 70s with minimal exertion.  I have asked the patient to be placed on BiPAP at this time.  Chest x-ray showed bilateral interstitial and lower lobe opacities.  BMP was unremarkable.  AST 38, ALT 28, alk phosphatase 105, total bilirubin 5.9.  WBC was 19.5 with hemoglobin 13.2, platelets 126.  Troponin was 9.  BNP 295.  Patient was given Solu-Medrol, furosemide, vancomycin, and Zosyn.  Assessment/Plan: Acute on  chronic respiratory failure with hypoxia -Secondary to pneumonia and exacerbation of his IPF -11/11--pt remains on BiPAP and unable to stay off bipap long -Wean oxygen back to baseline as tolerated -SARS-CoV2--neg -check viral respiratory panel--neg -wife requesting transfer to [facility] to see his pulmonologist Dr. Chase Caller and cardiologist Dr. Curt Bears. I spoke with Dr. Manuella Ghazi at [facility] who will accept care of patient when arrives at [facility].    Healthcare associated pneumonia/lobar pneumonia -Continue empiric vancomycin and cefepime -Check procalcitonin 0.33>>>0.56 -Follow blood cultures -MRSA neg-->d/c vanco -add azithro  Sepsis -Present on admission -Secondary to pneumonia -Judicious IV fluids -Lactic acid peaked 3.3 -Check procalcitonin 0.33>>>0.56 -Follow blood cultures - NGTD -Continue vancomycin and cefepime  Lactic acidosis -I feel that most of his lactic acidosis is due to his hypoxia -Judicious IV fluids  Leukocytosis - secondary to high dose steroids, following.   Exacerbation of IPF -Continue IV steroids -Continue Pulmicort -Continue Brovana  -Continue Duo nebs  Persistent atrial fibrillation -Status post ablation 02/18/2018 and 07/17/2018 -Continue apixaban -Continue diltiazem XT  Coronary artery disease -No chest pain presently -personally reviewed CXR-increased interstitial markings, coarsening -personally reviewed EKG--sinus, nonspecific STT changes -troponin 9>>>10 -repeat EKG   Atrial fibrillation - resumed all cardiac meds.  -Pt has a complex cardiac history, followed by Dr. Curt Bears. S/p ablation.    Anxiety and Chronic Pain -this is partly driving his dyspnea this am -11/10 am--pt was belligerent to staff and myself -start IV ativan 0.59m prn -restart home hydrocodone -PMP Aware--queried--pt receives monthly Norco #120;  Also had ativan 0.536m#30 on 10/22/18;  Previously on alprazolam last filled 07/14/18  Hyperbilirubinemia -previous CTs  suggest patient has liver cirrhosis -RUQ [place] -Following LFTs  Critical Care Procedure Note Authorized and Performed by: Murvin Natal MD  Total Critical Care time:  37 minutes  Due to a high probability of clinically significant, life threatening deterioration, the patient required my highest level of preparedness to intervene emergently and I personally spent this critical care time directly and personally managing the patient.  This critical care time included obtaining a history; examining the patient, pulse oximetry; ordering and review of studies; arranging urgent treatment with development of a management plan; evaluation of patient's response of treatment; frequent reassessment; and discussions with other providers.  This critical care time was performed to assess and manage the high probability of imminent and life threatening deterioration that could result in multi-organ failure.  It was exclusive of separately billable procedures and treating other patients and teaching time.   Disposition Plan:   Remain in SDU Family Communication:   Wife at bedside, requesting transfer to [facility]  Consultants:  none  Code Status:  FULL  DVT Prophylaxis: apixaban   Procedures: As Listed in Progress Note Above  Antibiotics: Cefepime 11/9>>> azithro 11/10>>> vanco 11/9>>>11/10  Subjective: Pt on bipap but able to answer questions by nodding and blinking. Unable to come off bipap for any length of time.   Objective: Vitals:   11/18/18 1000 11/18/18 1146 11/18/18 1200 11/18/18 1238  BP: (!) 147/103  122/69   Pulse: 80  78 86  Resp: (!) 21  (!) 26 (!) 29  Temp:  98.1 F (36.7 C)    TempSrc:  Axillary    SpO2: 94%  93% 94%  Weight:      Height:        Intake/Output Summary (Last 24 hours) at 11/18/2018 1246 Last data filed at 11/18/2018 0500 Gross per 24 hour  Intake 710 ml  Output 375 ml  Net 335 ml   Weight change: -10.5 kg Exam:   General:  Pt is on bipap, he is alert, follows  commands appropriately, not in acute distress  HEENT: No icterus, No thrush, No neck mass, [place]/AT  Cardiovascular: RRR, S1/S2, no rubs, no gallops  Respiratory: ppor air movement, bilateral crackles, no wheeze  Abdomen: Soft/+BS, non tender, non distended, no guarding  Extremities: No edema, No lymphangitis, No petechiae, No rashes, no synovitis  Data Reviewed: I have personally reviewed following labs and imaging studies Basic Metabolic Panel: Recent Labs  Lab 11/15/18 1402 12/06/2018 0909 11/17/18 0418 11/18/18 0356  NA 138 138 139 142  K 3.7 3.7 4.0 3.9  CL 104 104 104 107  CO2 '26 25 27 26  ' GLUCOSE 109* 122* 162* 141*  BUN '11 15 23 ' 34*  CREATININE 0.75 0.80 0.83 0.83  CALCIUM 8.2* 8.3* 9.1 9.1   Liver Function Tests: Recent Labs  Lab 11/19/2018 0909 11/17/18 0418 11/18/18 0356  AST 38 53* 53*  ALT '28 26 23  ' ALKPHOS 105 104 95  BILITOT 5.9* 4.1* 3.1*  PROT 6.6 7.0 6.5  ALBUMIN 2.9* 3.0* 2.8*   No results for input(s): LIPASE, AMYLASE in the last 168 hours. No results for input(s): AMMONIA in the last 168 hours. Coagulation Profile: Recent Labs  Lab 11/15/2018 0909  INR 1.8*   CBC: Recent Labs  Lab 11/15/18 1402 11/19/2018 0909 11/17/18 0418 11/18/18 0356  WBC 12.3* 19.6* 30.1* 27.3*  NEUTROABS  --  16.0*  --  25.7*  HGB 13.8 13.2 13.5 12.5*  HCT 42.9 41.0 41.8 39.3  MCV 97.5 98.8 96.1 98.0  PLT 130* 126* 121* 134*   Cardiac Enzymes: No results for input(s): CKTOTAL, CKMB, CKMBINDEX, TROPONINI in the last 168 hours. BNP: Invalid input(s): POCBNP CBG: No results for input(s): GLUCAP in the last 168 hours. HbA1C: Recent Labs    11/18/18 0356  HGBA1C 4.9   Urine analysis:    Component Value Date/Time   COLORURINE YELLOW 12/04/2018 1745   APPEARANCEUR CLEAR 11/17/2018 1745   LABSPEC 1.004 (L) 11/28/2018 1745   PHURINE 5.0 11/15/2018 1745   GLUCOSEU NEGATIVE 12/07/2018 1745   HGBUR MODERATE (A) 12/07/2018 1745   BILIRUBINUR NEGATIVE 11/28/2018  1745   BILIRUBINUR ++ 09/16/2016 1547   KETONESUR NEGATIVE 11/30/2018 1745   PROTEINUR NEGATIVE 11/19/2018 1745   UROBILINOGEN 4.0 (H) 04/04/2012 0505   NITRITE NEGATIVE 12/01/2018 1745   LEUKOCYTESUR NEGATIVE 11/15/2018 1745   Recent Results (from the past 240 hour(s))  SARS CORONAVIRUS 2 (TAT 6-24 HRS) Nasopharyngeal Nasopharyngeal Swab     Status: None   Collection Time: 12/05/2018  8:51 AM   Specimen: Nasopharyngeal Swab  Result Value Ref Range Status   SARS Coronavirus 2 NEGATIVE NEGATIVE Final    Comment: (NOTE) SARS-CoV-2 target nucleic acids are NOT DETECTED. The SARS-CoV-2 RNA is generally detectable in upper and lower respiratory specimens during the acute phase of infection. Negative results do not preclude SARS-CoV-2 infection, do not rule out co-infections with other pathogens, and should not be used as the sole basis for treatment or other patient management decisions. Negative results must be combined with clinical observations, patient history, and epidemiological information. The expected result is Negative. Fact Sheet for Patients: SugarRoll.be Fact Sheet for Healthcare Providers: https://www.woods-mathews.com/ This test is not yet approved or cleared by the [place] FDA and  has been authorized for detection and/or diagnosis of SARS-CoV-2 by FDA under an Emergency Use Authorization (EUA). This EUA will remain  in effect (meaning this test can be used) for the duration of the COVID-19 declaration under Section 56 4(b)(1) of the Act, 21 U.S.C. section 360bbb-3(b)(1), unless the authorization is terminated or revoked sooner. Performed at [facility], [place] [address], [place], [place] [address]   Blood Culture (routine x 2)     Status: None (Preliminary result)   Collection Time: 12/04/2018 10:22 AM   Specimen: BLOOD LEFT ARM  Result Value Ref Range Status   Specimen Description BLOOD LEFT ARM  Final   Special  Requests   Final    BOTTLES DRAWN AEROBIC AND ANAEROBIC Blood Culture results may not be optimal due to an inadequate volume of blood received in culture bottles   Culture   Final    NO GROWTH 2 DAYS Performed at [facility], [address] [place], [place] [address]    Report Status PENDING  Incomplete  Blood Culture (routine x 2)     Status: None (Preliminary result)   Collection Time: 11/11/2018 10:24 AM   Specimen: BLOOD LEFT ARM  Result Value Ref Range Status   Specimen Description BLOOD LEFT ARM  Final   Special Requests   Final    BOTTLES DRAWN AEROBIC AND ANAEROBIC Blood Culture results may not be optimal due to an inadequate volume of blood received in culture bottles   Culture   Final    NO GROWTH 2 DAYS Performed at [facility], [address], [place], [place] [address]    Report Status PENDING  Incomplete  Respiratory Panel by PCR  Status: None   Collection Time: 11/08/2018  2:40 PM   Specimen: Nasopharyngeal Swab; Respiratory  Result Value Ref Range Status   Adenovirus NOT DETECTED NOT DETECTED Final   Coronavirus 229E NOT DETECTED NOT DETECTED Final    Comment: (NOTE) The Coronavirus on the Respiratory Panel, DOES NOT test for the novel  Coronavirus (2019 nCoV)    Coronavirus HKU1 NOT DETECTED NOT DETECTED Final   Coronavirus NL63 NOT DETECTED NOT DETECTED Final   Coronavirus OC43 NOT DETECTED NOT DETECTED Final   Metapneumovirus NOT DETECTED NOT DETECTED Final   Rhinovirus / Enterovirus NOT DETECTED NOT DETECTED Final   Influenza A NOT DETECTED NOT DETECTED Final   Influenza B NOT DETECTED NOT DETECTED Final   Parainfluenza Virus 1 NOT DETECTED NOT DETECTED Final   Parainfluenza Virus 2 NOT DETECTED NOT DETECTED Final   Parainfluenza Virus 3 NOT DETECTED NOT DETECTED Final   Parainfluenza Virus 4 NOT DETECTED NOT DETECTED Final   Respiratory Syncytial Virus NOT DETECTED NOT DETECTED Final   Bordetella pertussis NOT DETECTED NOT DETECTED Final   Chlamydophila  pneumoniae NOT DETECTED NOT DETECTED Final   Mycoplasma pneumoniae NOT DETECTED NOT DETECTED Final    Comment: Performed at [facility], [address], [place], [place] [address]  MRSA PCR Screening     Status: None   Collection Time: 11/19/2018  2:40 PM   Specimen: Nasopharyngeal  Result Value Ref Range Status   MRSA by PCR NEGATIVE NEGATIVE Final    Comment:        The GeneXpert MRSA Assay (FDA approved for NASAL specimens only), is one component of a comprehensive MRSA colonization surveillance program. It is not intended to diagnose MRSA infection nor to guide or monitor treatment for MRSA infections. Performed at [facility], [address], [place], [place] [address]   Urine culture     Status: None   Collection Time: 11/11/2018  5:45 PM   Specimen: In/Out Cath Urine  Result Value Ref Range Status   Specimen Description   Final    IN/OUT CATH URINE Performed at [facility], [address] [place], [place] [address]    Special Requests   Final    NONE Performed at [facility], [address] [place], [place] [address]    Culture   Final    NO GROWTH Performed at [facility], [place] [address], [place],  [address]    Report Status 11/18/2018 FINAL  Final     Scheduled Meds: . apixaban  5 mg Oral BID  . arformoterol  15 mcg Nebulization BID  . brimonidine  1 drop Both Eyes BID  . budesonide (PULMICORT) nebulizer solution  0.5 mg Nebulization BID  . chlorhexidine  15 mL Mouth Rinse BID  . Chlorhexidine Gluconate Cloth  6 each Topical Daily  . diltiazem  120 mg Oral QPM  . dorzolamide  1 drop Left Eye BID  . ipratropium-albuterol  3 mL Nebulization Q4H  . latanoprost  1 drop Left Eye QHS  . mouth rinse  15 mL Mouth Rinse q12n4p  . methylPREDNISolone (SOLU-MEDROL) injection  125 mg Intravenous Once  . methylPREDNISolone (SOLU-MEDROL) injection  60 mg Intravenous Q6H   Continuous Infusions: . azithromycin 500 mg (11/18/18 1019)  . ceFEPime  (MAXIPIME) IV 2 g (11/18/18 0520)    Procedures/Studies: Dg Chest 2 View  Result Date: 11/15/2018 CLINICAL DATA:  Patient with chest pain. EXAM: CHEST - 2 VIEW COMPARISON:  Chest radiograph 08/16/2018 FINDINGS: Monitoring  leads overlie the patient. Stable cardiac and mediastinal contours. Similar-appearing diffuse bilateral interstitial pulmonary opacities. No definite superimposed pulmonary consolidation. No pleural effusion or pneumothorax. IMPRESSION: Similar-appearing diffuse bilateral interstitial pulmonary opacities compatible with interstitial lung disease. Electronically Signed   By: Lovey Newcomer M.D.   On: 11/15/2018 14:53   Dg Chest Portable 1 View  Result Date: 11/13/2018 CLINICAL DATA:  Worsening shortness of breath and weakness. History of pulmonary fibrosis. EXAM: PORTABLE CHEST 1 VIEW COMPARISON:  11/15/2018 FINDINGS: The cardiomediastinal silhouette is unchanged. There is a background of chronic interstitial lung disease. New/increased patchy opacities are present in the right greater than left lung bases and possibly left midlung. No pleural effusion or pneumothorax is identified. No acute osseous abnormality is seen. IMPRESSION: Chronic interstitial lung disease with new/increased patchy opacities in the right greater than left lung bases which may reflect superimposed pneumonia or edema. Electronically Signed   By: Logan Bores M.D.   On: 12/07/2018 09:39   US Abdomen Limited Ruq  Result Date: 11/17/2018 CLINICAL DATA:  Hyperbilirubinemia EXAM: ULTRASOUND ABDOMEN LIMITED RIGHT UPPER QUADRANT COMPARISON:  CT abdomen and pelvis 05/27/2016 FINDINGS: Gallbladder: Surgically absent, 2014 Common bile duct: Diameter: 3 mm, normal Liver: Heterogeneous hepatic echogenicity. Suboptimal assessment of intrahepatic detail due to sound attenuation. Liver appears slightly nodular in appearance on the prior CT exam, suggesting cirrhosis. Portal vein is patent on color Doppler imaging with normal  direction of blood flow towards the liver. Other: No RIGHT upper quadrant free fluid. Marked cortical atrophy of the RIGHT kidney is noted. IMPRESSION: Post cholecystectomy. Heterogeneous hepatic echogenicity, with liver appearing nodular question cirrhotic on the prior CT exam from 2018. No definite hepatic mass or biliary dilatation identified. Cortical atrophy RIGHT kidney. Electronically Signed   By: Lavonia Dana M.D.   On: 11/17/2018 17:30    Irwin Brakeman, MD [facility] How to contact the [facility] Attending or Consulting provider [place] or covering provider during after hours [place], for this patient?  1. Check the care team in [facility] and look for a) attending/consulting [facility] provider listed and b) the [facility] team listed 2. Log into [email or web address] and use [place]'s universal password to access. If you do not have the password, please contact the hospital operator. 3. Locate the [facility] provider you are looking for under [facility] and page to a number that you can be directly reached. 4. If you still have difficulty reaching the provider, please page the [facility] (Director on Call) for the Hospitalists listed on amion for assistance.   If 7PM-7AM, please contact night-coverage [email or web address] Password [email or web address] 11/18/2018, 12:46 PM   LOS: 2 days

## 2018-11-19 ENCOUNTER — Inpatient Hospital Stay (HOSPITAL_COMMUNITY): Payer: PPO | Admitting: Anesthesiology

## 2018-11-19 ENCOUNTER — Inpatient Hospital Stay (HOSPITAL_COMMUNITY): Payer: PPO

## 2018-11-19 DIAGNOSIS — L8990 Pressure ulcer of unspecified site, unspecified stage: Secondary | ICD-10-CM | POA: Insufficient documentation

## 2018-11-19 DIAGNOSIS — J80 Acute respiratory distress syndrome: Secondary | ICD-10-CM

## 2018-11-19 DIAGNOSIS — J9621 Acute and chronic respiratory failure with hypoxia: Secondary | ICD-10-CM

## 2018-11-19 DIAGNOSIS — R579 Shock, unspecified: Secondary | ICD-10-CM

## 2018-11-19 DIAGNOSIS — Z9911 Dependence on respirator [ventilator] status: Secondary | ICD-10-CM

## 2018-11-19 LAB — POCT I-STAT 7, (LYTES, BLD GAS, ICA,H+H)
Acid-base deficit: 2 mmol/L (ref 0.0–2.0)
Bicarbonate: 24.4 mmol/L (ref 20.0–28.0)
Calcium, Ion: 1.27 mmol/L (ref 1.15–1.40)
HCT: 31 % — ABNORMAL LOW (ref 39.0–52.0)
Hemoglobin: 10.5 g/dL — ABNORMAL LOW (ref 13.0–17.0)
O2 Saturation: 90 %
Patient temperature: 99.2
Potassium: 4.4 mmol/L (ref 3.5–5.1)
Sodium: 147 mmol/L — ABNORMAL HIGH (ref 135–145)
TCO2: 26 mmol/L (ref 22–32)
pCO2 arterial: 46.3 mmHg (ref 32.0–48.0)
pH, Arterial: 7.332 — ABNORMAL LOW (ref 7.350–7.450)
pO2, Arterial: 63 mmHg — ABNORMAL LOW (ref 83.0–108.0)

## 2018-11-19 LAB — BLOOD GAS, ARTERIAL
Acid-Base Excess: 2.1 mmol/L — ABNORMAL HIGH (ref 0.0–2.0)
Acid-base deficit: 1.9 mmol/L (ref 0.0–2.0)
Bicarbonate: 20.9 mmol/L (ref 20.0–28.0)
Bicarbonate: 25.7 mmol/L (ref 20.0–28.0)
FIO2: 100
FIO2: 85
O2 Saturation: 98.9 %
O2 Saturation: 95.7 %
Patient temperature: 37
Patient temperature: 37
pCO2 arterial: 77.8 mmHg (ref 32.0–48.0)
pCO2 arterial: 50 mmHg — ABNORMAL HIGH (ref 32.0–48.0)
pH, Arterial: 7.15 — CL (ref 7.350–7.450)
pH, Arterial: 7.353 (ref 7.350–7.450)
pO2, Arterial: 174 mmHg — ABNORMAL HIGH (ref 83.0–108.0)
pO2, Arterial: 81.4 mmHg — ABNORMAL LOW (ref 83.0–108.0)

## 2018-11-19 LAB — TRIGLYCERIDES: Triglycerides: 104 mg/dL (ref <150)

## 2018-11-19 LAB — GLUCOSE, CAPILLARY
Glucose-Capillary: 127 mg/dL — ABNORMAL HIGH (ref 70–99)
Glucose-Capillary: 128 mg/dL — ABNORMAL HIGH (ref 70–99)
Glucose-Capillary: 136 mg/dL — ABNORMAL HIGH (ref 70–99)
Glucose-Capillary: 147 mg/dL — ABNORMAL HIGH (ref 70–99)

## 2018-11-19 MED ORDER — PROPOFOL 1000 MG/100ML IV EMUL
5.0000 ug/kg/min | INTRAVENOUS | Status: DC
  Administered 2018-11-19: 17:00:00 10 ug/kg/min via INTRAVENOUS
  Administered 2018-11-19: 23:00:00 30 ug/kg/min via INTRAVENOUS
  Administered 2018-11-20: 20 ug/kg/min via INTRAVENOUS
  Filled 2018-11-19 (×2): qty 100

## 2018-11-19 MED ORDER — CHLORHEXIDINE GLUCONATE 0.12 % MT SOLN
15.0000 mL | Freq: Two times a day (BID) | OROMUCOSAL | Status: DC
  Administered 2018-11-19: 08:00:00 15 mL via OROMUCOSAL
  Filled 2018-11-19: qty 15

## 2018-11-19 MED ORDER — SUCCINYLCHOLINE CHLORIDE 20 MG/ML IJ SOLN
INTRAMUSCULAR | Status: DC | PRN
  Administered 2018-11-19: 120 mg via INTRAVENOUS

## 2018-11-19 MED ORDER — FENTANYL 2500MCG IN NS 250ML (10MCG/ML) PREMIX INFUSION
25.0000 ug/h | INTRAVENOUS | Status: DC
  Administered 2018-11-19: 15:00:00 50 ug/h via INTRAVENOUS
  Administered 2018-11-20: 200 ug/h via INTRAVENOUS
  Administered 2018-11-20: 125 ug/h via INTRAVENOUS
  Administered 2018-11-21 (×2): 200 ug/h via INTRAVENOUS
  Administered 2018-11-22: 03:00:00 120 ug/h via INTRAVENOUS
  Filled 2018-11-19 (×5): qty 250

## 2018-11-19 MED ORDER — SODIUM CHLORIDE 0.9 % IV BOLUS
1000.0000 mL | Freq: Once | INTRAVENOUS | Status: AC
  Administered 2018-11-19: 1000 mL via INTRAVENOUS

## 2018-11-19 MED ORDER — CHLORHEXIDINE GLUCONATE 0.12% ORAL RINSE (MEDLINE KIT)
15.0000 mL | Freq: Two times a day (BID) | OROMUCOSAL | Status: DC
  Administered 2018-11-19 – 2018-11-22 (×6): 15 mL via OROMUCOSAL

## 2018-11-19 MED ORDER — PROPOFOL 1000 MG/100ML IV EMUL
0.0000 ug/kg/min | INTRAVENOUS | Status: DC
  Administered 2018-11-19: 08:00:00 40 ug/kg/min via INTRAVENOUS
  Filled 2018-11-19: qty 100

## 2018-11-19 MED ORDER — METHYLPREDNISOLONE SODIUM SUCC 125 MG IJ SOLR
60.0000 mg | Freq: Two times a day (BID) | INTRAMUSCULAR | Status: DC
  Administered 2018-11-19 – 2018-11-22 (×6): 60 mg via INTRAVENOUS
  Filled 2018-11-19 (×6): qty 2

## 2018-11-19 MED ORDER — ALBUTEROL SULFATE (2.5 MG/3ML) 0.083% IN NEBU: 2.5000 mg | INHALATION_SOLUTION | RESPIRATORY_TRACT | Status: DC | PRN

## 2018-11-19 MED ORDER — ORAL CARE MOUTH RINSE
15.0000 mL | OROMUCOSAL | Status: DC
  Administered 2018-11-19 – 2018-11-22 (×25): 15 mL via OROMUCOSAL

## 2018-11-19 MED ORDER — FENTANYL CITRATE (PF) 100 MCG/2ML IJ SOLN
25.0000 ug | Freq: Once | INTRAMUSCULAR | Status: AC
  Administered 2018-11-19: 25 ug via INTRAVENOUS

## 2018-11-19 MED ORDER — DEXMEDETOMIDINE HCL IN NACL 400 MCG/100ML IV SOLN
0.0000 ug/kg/h | INTRAVENOUS | Status: DC
  Administered 2018-11-19: 10:00:00 0.4 ug/kg/h via INTRAVENOUS
  Administered 2018-11-19: 16:00:00 0.8 ug/kg/h via INTRAVENOUS
  Administered 2018-11-19 (×2): 1.2 ug/kg/h via INTRAVENOUS
  Administered 2018-11-20: 1 ug/kg/h via INTRAVENOUS
  Administered 2018-11-21 (×3): 0.7 ug/kg/h via INTRAVENOUS
  Administered 2018-11-22: 08:00:00 0.3 ug/kg/h via INTRAVENOUS
  Administered 2018-11-22: 02:00:00 0.7 ug/kg/h via INTRAVENOUS
  Filled 2018-11-19 (×10): qty 100

## 2018-11-19 MED ORDER — NOREPINEPHRINE 4 MG/250ML-% IV SOLN
2.0000 ug/min | INTRAVENOUS | Status: DC
  Administered 2018-11-19: 13:00:00 4 ug/min via INTRAVENOUS
  Filled 2018-11-19: qty 250

## 2018-11-19 MED ORDER — IPRATROPIUM-ALBUTEROL 0.5-2.5 (3) MG/3ML IN SOLN
3.0000 mL | RESPIRATORY_TRACT | Status: DC
  Administered 2018-11-19 – 2018-11-22 (×17): 3 mL via RESPIRATORY_TRACT
  Filled 2018-11-19 (×16): qty 3

## 2018-11-19 MED ORDER — ORAL CARE MOUTH RINSE
15.0000 mL | OROMUCOSAL | Status: DC
  Administered 2018-11-19 (×5): 15 mL via OROMUCOSAL

## 2018-11-19 MED ORDER — IPRATROPIUM-ALBUTEROL 0.5-2.5 (3) MG/3ML IN SOLN
RESPIRATORY_TRACT | Status: AC
  Filled 2018-11-19: qty 3

## 2018-11-19 MED ORDER — SODIUM CHLORIDE 0.9 % IV SOLN
250.0000 mL | INTRAVENOUS | Status: DC
  Administered 2018-11-19 – 2018-11-20 (×2): 250 mL via INTRAVENOUS

## 2018-11-19 MED ORDER — FENTANYL CITRATE (PF) 100 MCG/2ML IJ SOLN
25.0000 ug | INTRAMUSCULAR | Status: DC | PRN
  Administered 2018-11-19 (×7): 100 ug via INTRAVENOUS
  Administered 2018-11-20 (×2): 25 ug via INTRAVENOUS
  Administered 2018-11-20 (×3): 50 ug via INTRAVENOUS
  Administered 2018-11-21 (×3): 100 ug via INTRAVENOUS
  Filled 2018-11-19 (×5): qty 2

## 2018-11-19 MED ORDER — FENTANYL BOLUS VIA INFUSION
25.0000 ug | INTRAVENOUS | Status: DC | PRN
  Filled 2018-11-19: qty 25

## 2018-11-19 MED ORDER — FAMOTIDINE 20 MG PO TABS
20.0000 mg | ORAL_TABLET | Freq: Two times a day (BID) | ORAL | Status: DC
  Administered 2018-11-19 – 2018-11-20 (×3): 20 mg via ORAL
  Filled 2018-11-19 (×4): qty 1

## 2018-11-19 MED ORDER — ETOMIDATE 2 MG/ML IV SOLN
INTRAVENOUS | Status: DC | PRN
  Administered 2018-11-19: 16 mg via INTRAVENOUS

## 2018-11-19 MED ORDER — APIXABAN 5 MG PO TABS
5.0000 mg | ORAL_TABLET | Freq: Two times a day (BID) | ORAL | Status: DC
  Administered 2018-11-19 – 2018-11-23 (×7): 5 mg
  Filled 2018-11-19 (×8): qty 1

## 2018-11-19 MED ORDER — VITAL HIGH PROTEIN PO LIQD
1000.0000 mL | ORAL | Status: DC
  Administered 2018-11-19: 16:00:00 1000 mL

## 2018-11-19 MED ORDER — PRO-STAT SUGAR FREE PO LIQD
30.0000 mL | Freq: Two times a day (BID) | ORAL | Status: DC
  Administered 2018-11-19 – 2018-11-20 (×3): 30 mL
  Filled 2018-11-19 (×3): qty 30

## 2018-11-19 MED ORDER — CHLORHEXIDINE GLUCONATE 0.12% ORAL RINSE (MEDLINE KIT): 15.0000 mL | Freq: Two times a day (BID) | OROMUCOSAL | Status: DC

## 2018-11-19 MED ORDER — SODIUM CHLORIDE 0.9 % IV BOLUS
500.0000 mL | Freq: Once | INTRAVENOUS | Status: AC
  Administered 2018-11-19: 500 mL via INTRAVENOUS

## 2018-11-19 MED ORDER — FENTANYL CITRATE (PF) 100 MCG/2ML IJ SOLN
25.0000 ug | INTRAMUSCULAR | Status: DC | PRN
  Filled 2018-11-19: qty 2

## 2018-11-19 MED ORDER — LEVALBUTEROL HCL 0.63 MG/3ML IN NEBU: 0.6300 mg | INHALATION_SOLUTION | Freq: Four times a day (QID) | RESPIRATORY_TRACT | Status: DC | PRN

## 2018-11-19 NOTE — Consult Note (Signed)
NAME:  Curtis [name], MRN:  [id number], DOB:  [birth year], LOS: 3 ADMISSION DATE:  12/05/2018, CONSULTATION DATE:  11/19/18 REFERRING MD:  Wynetta Emery - [facility]  CHIEF COMPLAINT:  SOB   Brief History   Curtis [name] is a 73 y.o. male with PMH UIP / IPF who was admitted to [facility] 11/9 with acute hypoxic respiratory failure presumed due to HCAP + IPF flare.  History of present illness   Pt is encephelopathic; therefore, this HPI is obtained from chart review. Curtis [name] is a 73 y.o. male who has a PMH including but not limited to UIP / IPF  (followed by Dr. Chase Caller though last seen in 2019.  Previously followed at [facility] by Dr. Randol Kern), MI, HTN, HLD (intolerant to statins), A.fib, chest pain GERD, arthritis (see "past medical history" for rest).  He presented to [facility] 11/9 with dyspnea x 2 days with significant worsening on day of presentation.  He had been seen in ED day prior for left sided chest pain that was felt to be MSK related.  Admission was recommended but pt declined (he had admission 08/16/18 through 08/17/18 for similar symptoms and was found to have pericarditis.  He had been placed on 3 months of colchicine).  Of note, he saw PCP 2 weeks earlier to ED presentation and was told that he had rhinosinusitis and possible IPF flare.  He was subsequently placed on abx and 5 days of prednisone.  In ED, he desaturated into th 70s.  He was placed on BiPAP and was admitted by [facility] for possible HCAP vs IPF flare.  On 11/11 he was trialed on HFNC; however, desaturated into 70s again and had increased WOB; therefore, was placed back on BiPAP.  At the time, wife had requested transfer to [facility] to see his pulmonologist (Dr. Chase Caller) and cardiology (Dr. Curt Bears).  Due to lack of bed availability, transfer was delayed.  Unfortunately in AM 11/12, he decompensated further despite BiPAP and required intubation prior to transfer to [facility].  Per discussion with wife, pt was on Esbriet but this was stopped in  2019 at some time due to intolerance with GI side effects.  He used to see [facility] (last seen 2017) but has since switched to only following with Dr. Chase Caller.  Past Medical History  UIP / IPF (followed by Dr. Chase Caller as well as managed at [facility] by Dr. Randol Kern), MI, HTN, HLD (intolerant to statins), A.fib, chest pain GERD, arthritis  Significant Hospital Events   11/9 > admit. 11/12 > intubated, transferred to [facility].  Consults:  PCCM.  Procedures:  ETT 11/12 >   Significant Diagnostic Tests:  RUQ [place] 11/10 > nodular appearing liver.  Cortical atrophy right kidney. CXR 11/12 > b/l opacities.  Micro Data:  Blood 11/9 >  Urine 11/9 > neg. RVP 11/9 > neg. SARS CoV2 11/9 > neg.  Antimicrobials:  Vanc 11/9 > 11/10. Zosyn 11/9 x 1 Cefepime 11/9 > Azithro 11/10 >   Interim history/subjective:  Agitated after arrival to [facility].  Sedation had been turned off due to soft pressures.  Objective:  Blood pressure (!) 108/51, pulse (!) 105, temperature 98 F (36.7 C), resp. rate (!) 24, height 6\' 1"  (1.854 m), weight 97.8 kg, SpO2 100 %.    Vent Mode: PRVC FiO2 (%):  [65 %-100 %] 100 % Set Rate:  [15 bmp-20 bmp] 20 bmp Vt Set:  [580 mL] 580 mL PEEP:  [5 cmH20-7 cmH20] 5 cmH20 Plateau Pressure:  [38 cmH20] 38 cmH20  Intake/Output Summary (Last 24 hours) at 11/19/2018 0937 Last data filed at 11/19/2018 0813 Gross per 24 hour  Intake 822.66 ml  Output 1305 ml  Net -482.34 ml   Filed Weights   11/18/18 0500 11/19/18 0500 11/19/18 0800  Weight: 98.4 kg 97.5 kg 97.8 kg    Examination: General: Elderly male, chronically ill appearing, resting in bed, in NAD. Neuro: Agitated after sedation was turned off due to soft pressures.  Does not follow commands. HEENT: Reading/AT. Sclerae anicteric. ETT in place. Cardiovascular: RRR, no M/R/G.  Lungs: Respirations even and unlabored.  Coarse crackles. Abdomen: BS x 4, soft, NT/ND.  Musculoskeletal: No gross deformities, no edema.  Skin: Intact,  warm, no rashes.   Assessment & Plan:   Acute hypoxic respiratory failure - felt to be 2/2 IPF flare +/- HCAP. - Continue full vent support. - Repeat ABG and adjust vent accordingly. - If P/F remains < 150 then may need to consider proning. - Follow BNP, I/Os with goal neg balance. - SBT as able. - Continue steroids (dosing adjusted), BD's. - Continue abx and follow cultures. - Bronchial hygiene. - Follow CXR. - Needs to re-establish with Dr. Chase Caller locally  (last seen Aug 2019) and possibly again with [facility] (per care everywhere, appears last seen Oct 2017).  Hypotension - ? Sedation related (had been on propofol prior to transfer). - Low dose levophed as needed to support goal MAP > 65, hopeful will be short term now that propofol is off. - D/c dilt.  Hx HTN, HLD (intolerant to statins), MI, A.fib, chest pain. - Continue preadmission apixaban. - Hold preadmission diltiazem.  Hx GERD. - Continue preadmission famotidine.   Best Practice:  Diet: NPO. Pain/Anxiety/Delirium protocol (if indicated): Precedex gtt / fentanyl PRN.  RASS goal 0 to -1. VAP protocol (if indicated): In place. DVT prophylaxis: SCD's / Apixaban. GI prophylaxis: Famotidine. Glucose control: SSI. Mobility: Bedrest. Code Status: Full. Family Communication: Wife [name] called and updated over the phone. Disposition: ICU.  Labs   CBC: Recent Labs  Lab 11/15/18 1402 12/03/2018 0909 11/17/18 0418 11/18/18 0356  WBC 12.3* 19.6* 30.1* 27.3*  NEUTROABS  --  16.0*  --  25.7*  HGB 13.8 13.2 13.5 12.5*  HCT 42.9 41.0 41.8 39.3  MCV 97.5 98.8 96.1 98.0  PLT 130* 126* 121* [id number]*   Basic Metabolic Panel: Recent Labs  Lab 11/15/18 1402 11/19/2018 0909 11/17/18 0418 11/18/18 0356  NA 138 138 139 142  K 3.7 3.7 4.0 3.9  CL 104 104 104 107  CO2 26 25 27 26   GLUCOSE 109* 122* 162* 141*  BUN 11 15 23  34*  CREATININE 0.75 0.80 0.83 0.83  CALCIUM 8.2* 8.3* 9.1 9.1   GFR: Estimated Creatinine  Clearance: 94.7 mL/min (by C-G formula based on SCr of 0.83 mg/dL). Recent Labs  Lab 11/15/18 1402 11/20/2018 0909 11/25/2018 1022 11/28/2018 1227 11/19/2018 1440 11/17/18 0418 11/18/18 0356  PROCALCITON  --   --   --   --  0.33 0.56 0.31  WBC 12.3* 19.6*  --   --   --  30.1* 27.3*  LATICACIDVEN  --   --  3.3* 4.1*  --   --   --    Liver Function Tests: Recent Labs  Lab 12/04/2018 0909 11/17/18 0418 11/18/18 0356  AST 38 53* 53*  ALT 28 26 23   ALKPHOS 105 104 95  BILITOT 5.9* 4.1* 3.1*  PROT 6.6 7.0 6.5  ALBUMIN 2.9* 3.0* 2.8*   No results  for input(s): LIPASE, AMYLASE in the last 168 hours. No results for input(s): AMMONIA in the last 168 hours. ABG    Component Value Date/Time   PHART 7.424 11/17/2018 0706   PCO2ART 42.3 11/17/2018 0706   PO2ART 69.5 (L) 11/17/2018 0706   HCO3 26.9 11/17/2018 0706   O2SAT 93.3 11/17/2018 0706    Coagulation Profile: Recent Labs  Lab 11/19/2018 0909  INR 1.8*   Cardiac Enzymes: No results for input(s): CKTOTAL, CKMB, CKMBINDEX, TROPONINI in the last 168 hours. HbA1C: Hgb A1c MFr Bld  Date/Time Value Ref Range Status  11/18/2018 03:56 AM 4.9 4.8 - 5.6 % Final    Comment:    (NOTE) Pre diabetes:          5.7%-6.4% Diabetes:              >6.4% Glycemic control for   <7.0% adults with diabetes   06/18/2016 08:44 AM 5.1 4.8 - 5.6 % Final    Comment:             Pre-diabetes: 5.7 - 6.4          Diabetes: >6.4          Glycemic control for adults with diabetes: <7.0    CBG: No results for input(s): GLUCAP in the last 168 hours.  Review of Systems:   Unable to obtain as pt is encephalopathic.  Past medical history  He,  has a past medical history of Arthritis, Asthma, Atrial fibrillation ([place]), Cataract, Cervical spondylosis, Chest pain, Dyslipidemia, Dyspnea, GERD (gastroesophageal reflux disease), Glaucoma, Hearing loss, Hypertension, Hypertension, Idiopathic pulmonary fibrosis ([place]), Interstitial pulmonary fibrosis ([place]) (2012),  Left shoulder pain (2011), Myocardial infarction ([place]), Psoriasis, Pulmonary fibrosis ([place]), and Statin intolerance.   Surgical History    Past Surgical History:  Procedure Laterality Date  . ATRIAL FIBRILLATION ABLATION N/A 02/18/2018   Procedure: ATRIAL FIBRILLATION ABLATION;  Surgeon: Constance Haw, MD;  Location: [place] [facility];  Service: Cardiovascular;  Laterality: N/A;  . ATRIAL FIBRILLATION ABLATION N/A 07/17/2018   Procedure: ATRIAL FIBRILLATION ABLATION;  Surgeon: Constance Haw, MD;  Location: [place] [facility];  Service: Cardiovascular;  Laterality: N/A;  . BRAIN SURGERY    . CARDIOVERSION N/A 12/13/2015   Procedure: CARDIOVERSION;  Surgeon: Josue Hector, MD;  Location: [place];  Service: Cardiovascular;  Laterality: N/A;  . CATARACT EXTRACTION W/PHACO Right 06/11/2012   Procedure: CATARACT EXTRACTION PHACO AND INTRAOCULAR LENS PLACEMENT ([place]);  Surgeon: Tonny Branch, MD;  Location: [facility];  Service: Ophthalmology;  Laterality: Right;  CDE:  14.43  . CATARACT EXTRACTION W/PHACO Left 06/29/2012   Procedure: CATARACT EXTRACTION PHACO AND INTRAOCULAR LENS PLACEMENT (I[facility]);  Surgeon: Tonny Branch, MD;  Location: [facility];  Service: Ophthalmology;  Laterality: Left;  CDE:14.11  . CHOLECYSTECTOMY  05/08/2012  . CHOLECYSTECTOMY N/A 05/08/2012   Procedure: LAPAROSCOPIC CHOLECYSTECTOMY;  Surgeon: Harl Bowie, MD;  Location: [place];  Service: General;  Laterality: N/A;  . COLONOSCOPY    . LIVER BIOPSY N/A 05/08/2012   Procedure: LIVER BIOPSY;  Surgeon: Harl Bowie, MD;  Location: [place];  Service: General;  Laterality: N/A;  . NECK SURGERY     cervical disc with cliip  . SHOULDER ARTHROSCOPY WITH DISTAL CLAVICLE RESECTION Right 12/12/2016   Procedure: RIGHT SHOULDER ARTHROSCOPY, SUBACROMIAL DECOMPRESSION, DISTAL CLAVICLE RESECTION, WITH POSSIBLE MINI OPEN ROTATOR CUFF REPAIR;  Surgeon: [name], MD;  Location: [place];  Service: Orthopedics;  Laterality: Right;      Social  History   reports that he quit smoking about 15 years ago. His smoking use included cigarettes. He has a 26.00 pack-year smoking history. He has never used smokeless tobacco. He reports that he does not drink alcohol or use drugs.   Family history   His family history includes Breast cancer in his sister; COPD in his mother; Congestive Heart Failure in his mother; Hypertension in his mother; Stroke in his father.   Allergies Allergies  Allergen Reactions  . Pirfenidone Other (See Comments)    Elevated bilirubin  . Acyclovir And Related Other (See Comments)    Unknown  . Oxycodone Itching  . Pravastatin Other (See Comments)    Chills    . Simvastatin Other (See Comments)    Chills and Leg pain  . Prednisone Anxiety and Other (See Comments)    Irritation, Can tolerate in small doses   . Tegretol [Carbamazepine] Other (See Comments)    dizziness     Home meds  Prior to Admission medications   Medication Sig Start Date End Date Taking? Authorizing Provider  albuterol (PROVENTIL HFA;VENTOLIN HFA) 108 (90 Base) MCG/ACT inhaler Inhale 2 puffs into the lungs every 6 (six) hours as needed for wheezing. 04/08/16  Yes Kathyrn Drown, MD  apixaban (ELIQUIS) 5 MG TABS tablet Take 1 tablet (5 mg total) by mouth 2 (two) times daily. 07/27/18  Yes Sherran Needs, NP  brimonidine (ALPHAGAN) 0.2 % ophthalmic solution Place 1 drop into both eyes 2 (two) times daily. 07/30/18 07/30/19 Yes [provider]  calcium carbonate (TUMS - DOSED IN MG ELEMENTAL CALCIUM) 500 MG chewable tablet Chew 1 tablet by mouth daily as needed for indigestion or heartburn.   Yes [provider]  diltiazem (CARTIA XT) 120 MG 24 hr capsule TAKE 1 CAPSULE(120 MG) BY MOUTH DAILY Patient taking differently: Take 120 mg by mouth every evening.  07/22/18  Yes Sherran Needs, NP  dorzolamide (TRUSOPT) 2 % ophthalmic solution Place 1 drop into the left eye 2 (two) times daily. 10/25/18  Yes [provider]  famotidine (PEPCID) 20 MG tablet Take 1 tablet (20 mg total) by mouth 2 (two) times daily. 11/15/18  Yes Khatri, Hina, PA-C  fexofenadine (ALLEGRA) 180 MG tablet Take 180 mg by mouth daily as needed for allergies or rhinitis.   Yes [provider]  fluticasone (FLONASE) 50 MCG/ACT nasal spray Place 2 sprays into both nostrils daily. Patient taking differently: Place 1 spray into both nostrils daily.  01/02/16  Yes Kathyrn Drown, MD  HYDROcodone-acetaminophen (NORCO/VICODIN) 5-325 MG tablet Take 1 tablet by mouth 4 (four) times daily as needed for moderate pain 10/21/18  Yes Luking, Scott A, MD  ibuprofen (ADVIL) 200 MG tablet Take 200 mg by mouth every 6 (six) hours as needed for moderate pain.   Yes [provider]  latanoprost (XALATAN) 0.005 % ophthalmic solution Place 1 drop into the left eye at bedtime.  07/30/18 07/30/19 Yes [provider]  LORazepam (ATIVAN) 0.5 MG tablet Take one tablet po q 8 hrs prn anxiety Patient taking differently: Take 0.5 mg by mouth every 8 (eight) hours as needed for anxiety.  11/06/18  Yes Mikey Kirschner, MD  OXYGEN Inhale 2.5-2.75 L into the lungs daily as needed (for shortness of breath).    Yes [provider]  ALPRAZolam (XANAX) 0.5 MG tablet Take 0.5-1 tablets (0.25-0.5 mg total) by mouth 2 (two) times daily as needed for anxiety or sleep. Patient not taking: Reported  on 11/15/2018 07/22/18   Kathyrn Drown, MD  colchicine 0.6 MG tablet Take 1 tablet (0.6 mg total) by mouth 2 (two) times daily. Patient not taking: Reported on 11/15/2018 08/17/18 11/15/18  Radene Gunning, NP  HYDROcodone-acetaminophen (NORCO/VICODIN) 5-325 MG tablet Take 1 tablet by mouth 4 (four) times daily as needed for moderate pain. Patient not taking: Reported on 11/15/2018 10/21/18   Kathyrn Drown, MD  HYDROcodone-acetaminophen (NORCO/VICODIN) 5-325 MG tablet Take 1 tablet by mouth 4 (four) times daily as needed for moderate pain  Patient not taking: Reported on 11/15/2018 10/21/18   Kathyrn Drown, MD  pantoprazole (PROTONIX) 40 MG tablet Take 1 tablet (40 mg total) by mouth daily at 6 (six) AM. Patient not taking: Reported on 11/15/2018 08/17/18 11/15/18  Radene Gunning, NP  predniSONE (DELTASONE) 10 MG tablet Take 3 tablets qd for 3 days then take 2 tablets qd for 3 days then take one tablet po qd for 2 days Patient not taking: Reported on 11/15/2018 11/06/18   Mikey Kirschner, MD    Critical care time: 60 min.    Montey Hora, [facility] 11/19/2018, 1:50 PM

## 2018-11-19 NOTE — Progress Notes (Signed)
Pt called staff into room. His condom catheter had came off and his bed was soaked. Gave pt a bath, changed his linens. While his BiPAP did remain on, the activity of rolling and turning the patient during the bath made him very short of breath. He started yelling that he "cant breathe". And to "get someone down here right now". It was explained calmly and gently to the patient that his mask is on and working properly, and that no one in the building could magically make him breathe. I gave his ativan IV prn as ordered for the anxiety. Finished cleaning the patient, replaced the condom catheter, and gave the patient his call bell. Patient does state as I leave the room he is feeling better.

## 2018-11-19 NOTE — Anesthesia Procedure Notes (Signed)
Procedure Name: Intubation Date/Time: 11/19/2018 7:23 AM Performed by: Vista Deck, CRNA Pre-anesthesia Checklist: Patient identified, Emergency Drugs available, Suction available, Patient being monitored and Timeout performed Patient Re-evaluated:Patient Re-evaluated prior to induction Oxygen Delivery Method: Ambu bag Preoxygenation: Pre-oxygenation with 100% oxygen Induction Type: IV induction Laryngoscope Size: Mac and 4 Grade View: Grade II Tube type: Oral Tube size: 7.5 mm Number of attempts: 1 Airway Equipment and Method: Stylet and Video-laryngoscopy Placement Confirmation: ETT inserted through vocal cords under direct vision,  CO2 detector and breath sounds checked- equal and bilateral Secured at: 24 cm Tube secured with: secured by respiratory. Dental Injury: Teeth and Oropharynx as per pre-operative assessment

## 2018-11-19 NOTE — Progress Notes (Signed)
I feel like the ativan Is not having the desired effect on this patient. After initially calming down for a brief time, he is now continually pulling at his bipap mask, has broken one already, pulling at gown ivs leads etc. I will leave a sticky note on chart for MD to review about d/c of ativan.

## 2018-11-19 NOTE — Telephone Encounter (Signed)
I did have a conversation with the wife I also went by the ICU on Wednesday evening and spoke with the patient and his wife We are certainly sympathetic to his health issues thank you

## 2018-11-19 NOTE — Progress Notes (Signed)
Spoke with Dr. Luan Pulling about possible intubation of the patient as he was clearly getting fatigued and the BiPAP was not providing adequate support. Dr. Luan Pulling ordered intubation. Pt intubated by Helene Kelp, CRNA 7.5 ETT 23 @ teeth. Color change present. Lung sounds present bliaterally. PCXR for confirmation. Foley placed without incident. OGT placed. Orders received for sedation. Bethena Roys, wife, notified and was agreeable with the plan of care including intubation.

## 2018-11-19 NOTE — Progress Notes (Signed)
Patient is now continually pulling off his BiPAP mask. He will not follow instruction, choosing instead to pull off his gown his mask and his O2 monitor. Lung sounds are diminished. HR 100 O2 sat 90 on 70%FiO2 RR 33 and BP 156/88.

## 2018-11-19 NOTE — Consult Note (Signed)
Consult requested by: [facility] hospitalist, Dr. Wynetta Emery Consult requested for: Respiratory failure  HPI: This is a 73 year old who has known history of pulmonary fibrosis persistent atrial fib hypertension, hyperlipidemia, coronary disease thrombocytopenia who came to the emergency department because of shortness of breath that started on 11/14/2018 got worse by 11/21/2018 and he was admitted on that day.  He had been to the emergency department on the eighth because of left-sided chest pain that was felt to be likely musculoskeletal with negative troponins.  Admission was advised but he did not want to do that.  He was short of breath at that time it became much worse over the next 24 hours.  He was admitted to the hospital in August of this year with what was thought to be pericarditis and had finished 3 months of colchicine.  In the emergency department he was saturating 70% on room air placed on a nonrebreather and his oxygen saturation improved but he still desaturated into the 70s with minimal exertion.  He was put on BiPAP.  Chest x-ray which I have personally reviewed showed bilateral interstitial and lower lobe opacities.  Since that time he has been on BiPAP but over the last 12 hours has had increasing work of breathing increasing agitation marginal oxygen saturation despite BiPAP.  History is from the medical record as he is not able to provide any history  Past Medical History:  Diagnosis Date  . Arthritis   . Asthma   . Atrial fibrillation ([place])   . Cataract   . Cervical spondylosis   . Chest pain    Emergency room October 24, 2011, no MI  //   Nuclear, October, 2013, adenosine, EF 61%, no scar or ischemia  . Dyslipidemia    Statin intolerant  . Dyspnea   . GERD (gastroesophageal reflux disease)   . Glaucoma   . Hearing loss   . Hypertension   . Hypertension   . Idiopathic pulmonary fibrosis ([place])   . Interstitial pulmonary fibrosis ([place]) 2012  . Left shoulder pain 2011  .  Myocardial infarction ([place])   . Psoriasis   . Pulmonary fibrosis ([place])   . Statin intolerance      Family History  Problem Relation Age of Onset  . Hypertension Mother   . Congestive Heart Failure Mother   . COPD Mother   . Stroke Father   . Breast cancer Sister      Social History   Socioeconomic History  . Marital status: Married    Spouse name: Not on file  . Number of children: Not on file  . Years of education: Not on file  . Highest education level: Not on file  Occupational History  . Not on file  Social Needs  . Financial resource strain: Not on file  . Food insecurity    Worry: Not on file    Inability: Not on file  . Transportation needs    Medical: Not on file    Non-medical: Not on file  Tobacco Use  . Smoking status: Former Smoker    Packs/day: 0.50    Years: 52.00    Pack years: 26.00    Types: Cigarettes    Quit date: 09/08/2003    Years since quitting: 15.2  . Smokeless tobacco: Never Used  Substance and Sexual Activity  . Alcohol use: No    Comment: quit drinking 2004  . Drug use: No  . Sexual activity: Yes    Birth control/protection: None  Lifestyle  .  Physical activity    Days per week: Not on file    Minutes per session: Not on file  . Stress: Not on file  Relationships  . Social Herbalist on phone: Not on file    Gets together: Not on file    Attends religious service: Not on file    Active member of club or organization: Not on file    Attends meetings of clubs or organizations: Not on file    Relationship status: Not on file  Other Topics Concern  . Not on file  Social History Narrative  . Not on file     ROS: Unobtainable    Objective: Vital signs in last 24 hours: Temp:  [97.5 F (36.4 C)-98.1 F (36.7 C)] 98.1 F (36.7 C) (11/11 1146) Pulse Rate:  [33-120] 86 (11/12 0500) Resp:  [21-37] 27 (11/12 0500) BP: (122-168)/(69-109) 152/78 (11/12 0500) SpO2:  [90 %-97 %] 92 % (11/12 0500) FiO2 (%):  [65 %-75  %] 70 % (11/12 0249) Weight change:  Last BM Date: 11/18/18  Intake/Output from previous day: No intake/output data recorded.  PHYSICAL EXAM Constitutional: He is uncomfortable in appearance.  Not really able to make any conversation.  He is on BiPAP.  Eyes: Pupils react EOMI.  Ears nose mouth and throat: Mucous membranes are dry difficult exam through the BiPAP.  Cardiovascular: He is in atrial fib with a heart rate of about 1 10-1 20.  Respiratory respiratory rate is about 38.  He has bilateral crackles.  Gastrointestinal: His abdomen soft with no masses.  Musculoskeletal: He is moving all 4 extremities neurological: No focal abnormalities psychiatric: Unable to assess  Lab Results: Basic Metabolic Panel: Recent Labs    11/17/18 0418 11/18/18 0356  NA 139 142  K 4.0 3.9  CL 104 107  CO2 27 26  GLUCOSE 162* 141*  BUN 23 34*  CREATININE 0.83 0.83  CALCIUM 9.1 9.1   Liver Function Tests: Recent Labs    11/17/18 0418 11/18/18 0356  AST 53* 53*  ALT 26 23  ALKPHOS 104 95  BILITOT 4.1* 3.1*  PROT 7.0 6.5  ALBUMIN 3.0* 2.8*   No results for input(s): LIPASE, AMYLASE in the last 72 hours. No results for input(s): AMMONIA in the last 72 hours. CBC: Recent Labs    11/15/2018 0909 11/17/18 0418 11/18/18 0356  WBC 19.6* 30.1* 27.3*  NEUTROABS 16.0*  --  25.7*  HGB 13.2 13.5 12.5*  HCT 41.0 41.8 39.3  MCV 98.8 96.1 98.0  PLT 126* 121* 134*   Cardiac Enzymes: No results for input(s): CKTOTAL, CKMB, CKMBINDEX, TROPONINI in the last 72 hours. BNP: No results for input(s): PROBNP in the last 72 hours. D-Dimer: No results for input(s): DDIMER in the last 72 hours. CBG: No results for input(s): GLUCAP in the last 72 hours. Hemoglobin A1C: Recent Labs    11/18/18 0356  HGBA1C 4.9   Fasting Lipid Panel: No results for input(s): CHOL, HDL, LDLCALC, TRIG, CHOLHDL, LDLDIRECT in the last 72 hours. Thyroid Function Tests: No results for input(s): TSH, T4TOTAL, FREET4,  T3FREE, THYROIDAB in the last 72 hours. Anemia Panel: No results for input(s): VITAMINB12, FOLATE, FERRITIN, TIBC, IRON, RETICCTPCT in the last 72 hours. Coagulation: Recent Labs    11/25/2018 0909  LABPROT 20.9*  INR 1.8*   Urine Drug Screen: Drugs of Abuse  No results found for: LABOPIA, COCAINSCRNUR, LABBENZ, AMPHETMU, THCU, LABBARB  Alcohol Level: No results for input(s): ETH in the last  72 hours. Urinalysis: Recent Labs    12/06/2018 1745  COLORURINE YELLOW  LABSPEC 1.004*  PHURINE 5.0  GLUCOSEU NEGATIVE  HGBUR MODERATE*  BILIRUBINUR NEGATIVE  KETONESUR NEGATIVE  PROTEINUR NEGATIVE  NITRITE NEGATIVE  LEUKOCYTESUR NEGATIVE   Misc. Labs:   ABGS: Recent Labs    11/17/18 0706  PHART 7.424  PO2ART 69.5*  HCO3 26.9     MICROBIOLOGY: Recent Results (from the past 240 hour(s))  SARS CORONAVIRUS 2 (TAT 6-24 HRS) Nasopharyngeal Nasopharyngeal Swab     Status: None   Collection Time: 11/14/2018  8:51 AM   Specimen: Nasopharyngeal Swab  Result Value Ref Range Status   SARS Coronavirus 2 NEGATIVE NEGATIVE Final    Comment: (NOTE) SARS-CoV-2 target nucleic acids are NOT DETECTED. The SARS-CoV-2 RNA is generally detectable in upper and lower respiratory specimens during the acute phase of infection. Negative results do not preclude SARS-CoV-2 infection, do not rule out co-infections with other pathogens, and should not be used as the sole basis for treatment or other patient management decisions. Negative results must be combined with clinical observations, patient history, and epidemiological information. The expected result is Negative. Fact Sheet for Patients: SugarRoll.be Fact Sheet for Healthcare Providers: https://www.woods-mathews.com/ This test is not yet approved or cleared by the [place] FDA and  has been authorized for detection and/or diagnosis of SARS-CoV-2 by FDA under an Emergency Use Authorization (EUA).  This EUA will remain  in effect (meaning this test can be used) for the duration of the COVID-19 declaration under Section 56 4(b)(1) of the Act, 21 U.S.C. section 360bbb-3(b)(1), unless the authorization is terminated or revoked sooner. Performed at [facility], [place] [address], [place], [place] [address]   Blood Culture (routine x 2)     Status: None (Preliminary result)   Collection Time: 11/15/2018 10:22 AM   Specimen: BLOOD LEFT ARM  Result Value Ref Range Status   Specimen Description BLOOD LEFT ARM  Final   Special Requests   Final    BOTTLES DRAWN AEROBIC AND ANAEROBIC Blood Culture results may not be optimal due to an inadequate volume of blood received in culture bottles   Culture   Final    NO GROWTH 2 DAYS Performed at [facility], [address] [place], [place] [address]    Report Status PENDING  Incomplete  Blood Culture (routine x 2)     Status: None (Preliminary result)   Collection Time: 11/28/2018 10:24 AM   Specimen: BLOOD LEFT ARM  Result Value Ref Range Status   Specimen Description BLOOD LEFT ARM  Final   Special Requests   Final    BOTTLES DRAWN AEROBIC AND ANAEROBIC Blood Culture results may not be optimal due to an inadequate volume of blood received in culture bottles   Culture   Final    NO GROWTH 2 DAYS Performed at [facility] [address], [place], [place] [address]    Report Status PENDING  Incomplete  Respiratory Panel by PCR     Status: None   Collection Time: 12/03/2018  2:40 PM   Specimen: Nasopharyngeal Swab; Respiratory  Result Value Ref Range Status   Adenovirus NOT DETECTED NOT DETECTED Final   Coronavirus 229E NOT DETECTED NOT DETECTED Final    Comment: (NOTE) The Coronavirus on the Respiratory Panel, DOES NOT test for the novel  Coronavirus (2019 nCoV)    Coronavirus HKU1 NOT DETECTED NOT DETECTED Final   Coronavirus NL63 NOT DETECTED NOT DETECTED Final   Coronavirus OC43  NOT DETECTED NOT DETECTED Final   Metapneumovirus NOT  DETECTED NOT DETECTED Final   Rhinovirus / Enterovirus NOT DETECTED NOT DETECTED Final   Influenza A NOT DETECTED NOT DETECTED Final   Influenza B NOT DETECTED NOT DETECTED Final   Parainfluenza Virus 1 NOT DETECTED NOT DETECTED Final   Parainfluenza Virus 2 NOT DETECTED NOT DETECTED Final   Parainfluenza Virus 3 NOT DETECTED NOT DETECTED Final   Parainfluenza Virus 4 NOT DETECTED NOT DETECTED Final   Respiratory Syncytial Virus NOT DETECTED NOT DETECTED Final   Bordetella pertussis NOT DETECTED NOT DETECTED Final   Chlamydophila pneumoniae NOT DETECTED NOT DETECTED Final   Mycoplasma pneumoniae NOT DETECTED NOT DETECTED Final    Comment: Performed at [facility], [place] [address] [place], [place] [address]  MRSA PCR Screening     Status: None   Collection Time: 12/07/2018  2:40 PM   Specimen: Nasopharyngeal  Result Value Ref Range Status   MRSA by PCR NEGATIVE NEGATIVE Final    Comment:        The GeneXpert MRSA Assay (FDA approved for NASAL specimens only), is one component of a comprehensive MRSA colonization surveillance program. It is not intended to diagnose MRSA infection nor to guide or monitor treatment for MRSA infections. Performed at [facility], [address], [place], [place] [address]   Urine culture     Status: None   Collection Time: 11/22/2018  5:45 PM   Specimen: In/Out Cath Urine  Result Value Ref Range Status   Specimen Description   Final    IN/OUT CATH URINE Performed at [facility], [address] [place], [place] [address]    Special Requests   Final    NONE Performed at [facility], [address] [place], [place] [address]    Culture   Final    NO GROWTH Performed at [facility], [place] [address] [place], [place] [address]    Report Status 11/18/2018 FINAL  Final    Studies/Results: US Abdomen Limited Ruq  Result Date: 11/17/2018 CLINICAL DATA:  Hyperbilirubinemia EXAM: ULTRASOUND ABDOMEN LIMITED RIGHT UPPER QUADRANT  COMPARISON:  CT abdomen and pelvis 05/27/2016 FINDINGS: Gallbladder: Surgically absent, 2014 Common bile duct: Diameter: 3 mm, normal Liver: Heterogeneous hepatic echogenicity. Suboptimal assessment of intrahepatic detail due to sound attenuation. Liver appears slightly nodular in appearance on the prior CT exam, suggesting cirrhosis. Portal vein is patent on color Doppler imaging with normal direction of blood flow towards the liver. Other: No RIGHT upper quadrant free fluid. Marked cortical atrophy of the RIGHT kidney is noted. IMPRESSION: Post cholecystectomy. Heterogeneous hepatic echogenicity, with liver appearing nodular question cirrhotic on the prior CT exam from 2018. No definite hepatic mass or biliary dilatation identified. Cortical atrophy RIGHT kidney. Electronically Signed   By: Lavonia Dana M.D.   On: 11/17/2018 17:30    Medications:  Prior to Admission:  Medications Prior to Admission  Medication Sig Dispense Refill Last Dose  . albuterol (PROVENTIL HFA;VENTOLIN HFA) 108 (90 Base) MCG/ACT inhaler Inhale 2 puffs into the lungs every 6 (six) hours as needed for wheezing. 1 Inhaler 2 11/15/2018 at Unknown time  . apixaban (ELIQUIS) 5 MG TABS tablet Take 1 tablet (5 mg total) by mouth 2 (two) times daily. 180 tablet 1 11/15/2018 at 2100  . brimonidine (ALPHAGAN) 0.2 % ophthalmic solution Place 1 drop into both eyes 2 (two) times daily.   11/15/2018 at Unknown time  . calcium carbonate (TUMS - DOSED IN MG  ELEMENTAL CALCIUM) 500 MG chewable tablet Chew 1 tablet by mouth daily as needed for indigestion or heartburn.   11/15/2018 at Unknown time  . diltiazem (CARTIA XT) 120 MG 24 hr capsule TAKE 1 CAPSULE(120 MG) BY MOUTH DAILY (Patient taking differently: Take 120 mg by mouth every evening. ) 30 capsule 5 11/15/2018 at Unknown time  . dorzolamide (TRUSOPT) 2 % ophthalmic solution Place 1 drop into the left eye 2 (two) times daily.   11/15/2018 at Unknown time  . famotidine (PEPCID) 20 MG tablet Take 1  tablet (20 mg total) by mouth 2 (two) times daily. 30 tablet 0 11/15/2018 at Unknown time  . fexofenadine (ALLEGRA) 180 MG tablet Take 180 mg by mouth daily as needed for allergies or rhinitis.   11/15/2018 at Unknown time  . fluticasone (FLONASE) 50 MCG/ACT nasal spray Place 2 sprays into both nostrils daily. (Patient taking differently: Place 1 spray into both nostrils daily. ) 16 g 5 11/15/2018 at Unknown time  . HYDROcodone-acetaminophen (NORCO/VICODIN) 5-325 MG tablet Take 1 tablet by mouth 4 (four) times daily as needed for moderate pain 120 tablet 0 11/15/2018 at Unknown time  . ibuprofen (ADVIL) 200 MG tablet Take 200 mg by mouth every 6 (six) hours as needed for moderate pain.   11/15/2018 at Unknown time  . latanoprost (XALATAN) 0.005 % ophthalmic solution Place 1 drop into the left eye at bedtime.    11/15/2018 at Unknown time  . LORazepam (ATIVAN) 0.5 MG tablet Take one tablet po q 8 hrs prn anxiety (Patient taking differently: Take 0.5 mg by mouth every 8 (eight) hours as needed for anxiety. ) 30 tablet 0   . OXYGEN Inhale 2.5-2.75 L into the lungs daily as needed (for shortness of breath).      . ALPRAZolam (XANAX) 0.5 MG tablet Take 0.5-1 tablets (0.25-0.5 mg total) by mouth 2 (two) times daily as needed for anxiety or sleep. (Patient not taking: Reported on 11/15/2018) 20 tablet 2 Not Taking at Unknown time  . colchicine 0.6 MG tablet Take 1 tablet (0.6 mg total) by mouth 2 (two) times daily. (Patient not taking: Reported on 11/15/2018) 180 tablet 0   . HYDROcodone-acetaminophen (NORCO/VICODIN) 5-325 MG tablet Take 1 tablet by mouth 4 (four) times daily as needed for moderate pain. (Patient not taking: Reported on 11/15/2018) 120 tablet 0 Not Taking at Unknown time  . HYDROcodone-acetaminophen (NORCO/VICODIN) 5-325 MG tablet Take 1 tablet by mouth 4 (four) times daily as needed for moderate pain (Patient not taking: Reported on 11/15/2018) 120 tablet 0 Not Taking at Unknown time  . pantoprazole  (PROTONIX) 40 MG tablet Take 1 tablet (40 mg total) by mouth daily at 6 (six) AM. (Patient not taking: Reported on 11/15/2018) 90 tablet 0   . predniSONE (DELTASONE) 10 MG tablet Take 3 tablets qd for 3 days then take 2 tablets qd for 3 days then take one tablet po qd for 2 days (Patient not taking: Reported on 11/15/2018) 15 tablet 0 Not Taking at Unknown time   Scheduled: . apixaban  5 mg Oral BID  . arformoterol  15 mcg Nebulization BID  . brimonidine  1 drop Both Eyes BID  . budesonide (PULMICORT) nebulizer solution  0.5 mg Nebulization BID  . chlorhexidine  15 mL Mouth Rinse BID  . Chlorhexidine Gluconate Cloth  6 each Topical Daily  . diltiazem  120 mg Oral QPM  . dorzolamide  1 drop Left Eye BID  . ipratropium  0.5 mg Nebulization TID  .  latanoprost  1 drop Left Eye QHS  . levalbuterol  0.63 mg Nebulization Q4H  . mouth rinse  15 mL Mouth Rinse q12n4p  . methylPREDNISolone (SOLU-MEDROL) injection  125 mg Intravenous Once  . methylPREDNISolone (SOLU-MEDROL) injection  60 mg Intravenous Q6H   Continuous: . azithromycin 500 mg (11/18/18 1019)  . ceFEPime (MAXIPIME) IV 2 g (11/19/18 0511)   [id number] **OR** acetaminophen, albuterol, diltiazem, HYDROcodone-acetaminophen, ondansetron **OR** ondansetron (ZOFRAN) IV  Assesment: He has acute on chronic respiratory failure and I think now he needs to be intubated and placed on mechanical ventilation.  He is having significantly increased work of breathing not being able to cooperate with BiPAP.  He has pneumonia and is being treated appropriately  He has atrial fib with rapid response.  Recent history of pericarditis that seems to have resolved  He is obviously critically ill. Active Problems:   IPF (idiopathic pulmonary fibrosis) ([facility])   Persistent atrial fibrillation   Atrial fibrillation, persistent   Acute on chronic respiratory failure ([place])   Sepsis due to undetermined organism ([place])   Lobar pneumonia  ([place])    Plan: Intubated and placed on mechanical ventilation.  Discussed with wife by phone    LOS: 3 days   Curtis [name] 11/19/2018, 7:07 AM

## 2018-11-19 NOTE — Progress Notes (Signed)
[place]  PROGRESS NOTE  Curtis [name] YEM:[id number] DOB: [birth year] DOA: 11/13/2018 PCP: Kathyrn Drown, MD  Brief History:  73 y.o. male with medical history of IPF, persistent atrial fib s/p ablation 02/18/2018 and 07/17/2018, hypertension, hyperlipidemia, coronary disease, and thrombocytopenia presenting with shortness of breath that began on 11/14/2018 that was significantly worsened in the morning of 11/29/2018.  Patient actually visited emergency department on 11/15/2018 secondary to having some left-sided chest pain.  It was felt this was likely musculoskeletal.  His troponins were negative at that time.  Admission was advised at that time, but the patient deferred.  The patient stated he had some shortness of breath at that time when he was in the emergency department, but has significantly worsened over the last 24 hours.   Notably, the patient had hospital admit from 08/16/2018 to 08/17/2018 for chest pain.  He was seen by cardiology who felt the patient had pericarditis at that time.  He has finished 3 months of colchicine.  He saw his primary care provider approximately 2 weeks prior to this admission.  He was felt to have had rhinosinusitis and possibly an exacerbation of his IPF.  He was placed on antibiotics as well as 5 days of prednisone which he finished approximately 1 week prior to this admission.  In the emergency department, the patient was afebrile hemodynamically stable saturating 70% on room air.  He was placed on nonrebreather.  However, the patient desaturated into the 70s with minimal exertion.  I have asked the patient to be placed on BiPAP at this time.  Chest x-ray showed bilateral interstitial and lower lobe opacities.  BMP was unremarkable.  AST 38, ALT 28, alk phosphatase 105, total bilirubin 5.9.  WBC was 19.5 with hemoglobin 13.2, platelets 126.  Troponin was 9.  BNP 295.  Patient was given Solu-Medrol, furosemide, vancomycin, and Zosyn.   Assessment/Plan: Acute on chronic respiratory failure with hypoxia -Secondary to pneumonia and exacerbation of his IPF -11/11--pt remains on BiPAP and unable to stay off bipap long -Wean oxygen back to baseline as tolerated -SARS-CoV2--neg -check viral respiratory panel--neg -wife requesting transfer to [facility] to see his pulmonologist Dr. Chase Caller and cardiologist Dr. Curt Bears. I spoke with Dr. Manuella Ghazi at [facility] who will accept care of patient when arrives at [facility].   -updated 11/19/18: Pt has had increased work of breathing and unable to tolerate bipap, he was seen by pulmonologist Dr. Luan Pulling and being intubated.  I called and spoke with critical care at [facility] and they accepted him.  I spoke with Dr. Carlis Abbott.  I also spoke with Dr. Luan Pulling to discuss care plans.   Wife updated by telephone.   Healthcare associated pneumonia/lobar pneumonia -Continue empiric vancomycin and cefepime -Check procalcitonin 0.33>>>0.56 -Follow blood cultures -MRSA neg-->d/c vanco -add azithromycin  Sepsis -Present on admission -Secondary to pneumonia -Judicious IV fluids -Lactic acid peaked 3.3 -Check procalcitonin 0.33>>>0.56 -Follow blood cultures - NGTD -Continue vancomycin and cefepime  Lactic acidosis -I feel that most of his lactic acidosis is due to his hypoxia -Judicious IV fluids  Leukocytosis - secondary to high dose steroids, following.   Exacerbation of IPF -Continue IV steroids -Continue Pulmicort -Continue Brovana  -Continue Duo nebs  Persistent atrial fibrillation -Status post ablation 02/18/2018 and 07/17/2018 -Continue apixaban -Continue diltiazem XT  Coronary artery disease -No chest pain presently -personally reviewed CXR-increased interstitial markings, coarsening -personally reviewed EKG--sinus, nonspecific STT changes -troponin 9>>>10 -repeat EKG  Atrial fibrillation -  resumed all cardiac meds.  -Pt has a complex cardiac history, followed by Dr. Curt Bears. S/p ablation.     Anxiety and Chronic Pain -this is partly driving his dyspnea this am -11/10 am--pt was belligerent to staff and myself -start IV ativan 0.23m prn -restart home hydrocodone -PMP Aware--queried--pt receives monthly Norco #120;  Also had ativan 0.570m#30 on 10/22/18;  Previously on alprazolam last filled 07/14/18  Hyperbilirubinemia -previous CTs suggest patient has liver cirrhosis -RUQ [place] LFTs  Critical Care Procedure Note Authorized and Performed by: C.Murvin NatalD  Total Critical Care time:  40 minutes  Due to a high probability of clinically significant, life threatening deterioration, the patient required my highest level of preparedness to intervene emergently and I personally spent this critical care time directly and personally managing the patient.  This critical care time included obtaining a history; examining the patient, pulse oximetry; ordering and review of studies; arranging urgent treatment with development of a management plan; evaluation of patient's response of treatment; frequent reassessment; and discussions with other providers.  This critical care time was performed to assess and manage the high probability of imminent and life threatening deterioration that could result in multi-organ failure.  It was exclusive of separately billable procedures and treating other patients and teaching time.   Disposition Plan:   [facility]:   Wife updated by telephone  Consultants:  none  Code Status:  FULL  DVT Prophylaxis: apixaban   Procedures: As Listed in Progress Note Above  Antibiotics: Cefepime 11/9>>> azithro 11/10>>> vanco 11/9>>>11/10  Subjective: Pt sedated on vent.   Objective: Vitals:   11/19/18 0249 11/19/18 0300 11/19/18 0400 11/19/18 0500  BP:  (!) 155/92 (!) 157/97 (!) 152/78  Pulse:  (!) 111 (!) 118 86  Resp:  (!) 32 (!) 30 (!) 27  Temp:   98 F (36.7 C)   TempSrc:      SpO2: 93% 92% 92% 92%  Weight:     97.5 kg  Height:        Intake/Output Summary (Last 24 hours) at 11/19/2018 0725 Last data filed at 11/19/2018 0000 Gross per 24 hour  Intake -  Output 1300 ml  Net -1300 ml   Weight change: -0.9 kg Exam:   General:  Pt sedated on the vent.   HEENT: No icterus, No thrush, No neck mass, [place]/AT  Cardiovascular: RRR, S1/S2, no rubs, no gallops  Respiratory: poor air movement, bilateral crackles, no wheeze  Abdomen: Soft/+BS, non tender, non distended, no guarding  Extremities: No edema, No lymphangitis, No petechiae, No rashes, no synovitis  Data Reviewed: I have personally reviewed following labs and imaging studies Basic Metabolic Panel: Recent Labs  Lab 11/15/18 1402 11/21/2018 0909 11/17/18 0418 11/18/18 0356  NA 138 138 139 142  K 3.7 3.7 4.0 3.9  CL 104 104 104 107  CO2 '26 25 27 26  ' GLUCOSE 109* 122* 162* 141*  BUN '11 15 23 ' 34*  CREATININE 0.75 0.80 0.83 0.83  CALCIUM 8.2* 8.3* 9.1 9.1   Liver Function Tests: Recent Labs  Lab 11/14/2018 0909 11/17/18 0418 11/18/18 0356  AST 38 53* 53*  ALT '28 26 23  ' ALKPHOS 105 104 95  BILITOT 5.9* 4.1* 3.1*  PROT 6.6 7.0 6.5  ALBUMIN 2.9* 3.0* 2.8*   No results for input(s): LIPASE, AMYLASE in the last 168 hours. No results for input(s): AMMONIA in the last 168 hours. Coagulation Profile: Recent Labs  Lab 11/23/2018 [id number]  INR 1.8*   CBC: Recent Labs  Lab 11/15/18 1402 12/02/2018 0909 11/17/18 0418 11/18/18 0356  WBC 12.3* 19.6* 30.1* 27.3*  NEUTROABS  --  16.0*  --  25.7*  HGB 13.8 13.2 13.5 12.5*  HCT 42.9 41.0 41.8 39.3  MCV 97.5 98.8 96.1 98.0  PLT 130* 126* 121* 134*   Cardiac Enzymes: No results for input(s): CKTOTAL, CKMB, CKMBINDEX, TROPONINI in the last 168 hours. BNP: Invalid input(s): POCBNP CBG: No results for input(s): GLUCAP in the last 168 hours. HbA1C: Recent Labs    11/18/18 0356  HGBA1C 4.9   Urine analysis:    Component Value Date/Time   COLORURINE YELLOW 11/11/2018 1745    APPEARANCEUR CLEAR 12/04/2018 1745   LABSPEC 1.004 (L) 11/20/2018 1745   PHURINE 5.0 11/18/2018 1745   GLUCOSEU NEGATIVE 11/26/2018 1745   HGBUR MODERATE (A) 11/17/2018 1745   BILIRUBINUR NEGATIVE 11/21/2018 1745   BILIRUBINUR ++ 09/16/2016 1547   KETONESUR NEGATIVE 12/06/2018 1745   PROTEINUR NEGATIVE 11/15/2018 1745   UROBILINOGEN 4.0 (H) 04/04/2012 0505   NITRITE NEGATIVE 11/24/2018 1745   LEUKOCYTESUR NEGATIVE 12/01/2018 1745   Recent Results (from the past 240 hour(s))  SARS CORONAVIRUS 2 (TAT 6-24 HRS) Nasopharyngeal Nasopharyngeal Swab     Status: None   Collection Time: 11/28/2018  8:51 AM   Specimen: Nasopharyngeal Swab  Result Value Ref Range Status   SARS Coronavirus 2 NEGATIVE NEGATIVE Final    Comment: (NOTE) SARS-CoV-2 target nucleic acids are NOT DETECTED. The SARS-CoV-2 RNA is generally detectable in upper and lower respiratory specimens during the acute phase of infection. Negative results do not preclude SARS-CoV-2 infection, do not rule out co-infections with other pathogens, and should not be used as the sole basis for treatment or other patient management decisions. Negative results must be combined with clinical observations, patient history, and epidemiological information. The expected result is Negative. Fact Sheet for Patients: SugarRoll.be Fact Sheet for Healthcare Providers: https://www.woods-mathews.com/ This test is not yet approved or cleared by the [place] FDA and  has been authorized for detection and/or diagnosis of SARS-CoV-2 by FDA under an Emergency Use Authorization (EUA). This EUA will remain  in effect (meaning this test can be used) for the duration of the COVID-19 declaration under Section 56 4(b)(1) of the Act, 21 U.S.C. section 360bbb-3(b)(1), unless the authorization is terminated or revoked sooner. Performed at [facility], [place] [address] [place], [place] [address]   Blood  Culture (routine x 2)     Status: None (Preliminary result)   Collection Time: 12/06/2018 10:22 AM   Specimen: BLOOD LEFT ARM  Result Value Ref Range Status   Specimen Description BLOOD LEFT ARM  Final   Special Requests   Final    BOTTLES DRAWN AEROBIC AND ANAEROBIC Blood Culture results may not be optimal due to an inadequate volume of blood received in culture bottles   Culture   Final    NO GROWTH 2 DAYS Performed at [facility], [address], [place],  [address]    Report Status PENDING  Incomplete  Blood Culture (routine x 2)     Status: None (Preliminary result)   Collection Time: 11/13/2018 10:24 AM   Specimen: BLOOD LEFT ARM  Result Value Ref Range Status   Specimen Description BLOOD LEFT ARM  Final   Special Requests   Final    BOTTLES DRAWN AEROBIC AND ANAEROBIC Blood Culture results may not be optimal due to an inadequate volume of blood received in  culture bottles   Culture   Final    NO GROWTH 2 DAYS Performed at [facility], [address], [place], [place] [address]    Report Status PENDING  Incomplete  Respiratory Panel by PCR     Status: None   Collection Time: 11/13/2018  2:40 PM   Specimen: Nasopharyngeal Swab; Respiratory  Result Value Ref Range Status   Adenovirus NOT DETECTED NOT DETECTED Final   Coronavirus 229E NOT DETECTED NOT DETECTED Final    Comment: (NOTE) The Coronavirus on the Respiratory Panel, DOES NOT test for the novel  Coronavirus (2019 nCoV)    Coronavirus HKU1 NOT DETECTED NOT DETECTED Final   Coronavirus NL63 NOT DETECTED NOT DETECTED Final   Coronavirus OC43 NOT DETECTED NOT DETECTED Final   Metapneumovirus NOT DETECTED NOT DETECTED Final   Rhinovirus / Enterovirus NOT DETECTED NOT DETECTED Final   Influenza A NOT DETECTED NOT DETECTED Final   Influenza B NOT DETECTED NOT DETECTED Final   Parainfluenza Virus 1 NOT DETECTED NOT DETECTED Final   Parainfluenza Virus 2 NOT DETECTED NOT DETECTED Final   Parainfluenza Virus 3 NOT  DETECTED NOT DETECTED Final   Parainfluenza Virus 4 NOT DETECTED NOT DETECTED Final   Respiratory Syncytial Virus NOT DETECTED NOT DETECTED Final   Bordetella pertussis NOT DETECTED NOT DETECTED Final   Chlamydophila pneumoniae NOT DETECTED NOT DETECTED Final   Mycoplasma pneumoniae NOT DETECTED NOT DETECTED Final    Comment: Performed at [facility], [place], [place], [place] [address]  MRSA PCR Screening     Status: None   Collection Time: 11/12/2018  2:40 PM   Specimen: Nasopharyngeal  Result Value Ref Range Status   MRSA by PCR NEGATIVE NEGATIVE Final    Comment:        The GeneXpert MRSA Assay (FDA approved for NASAL specimens only), is one component of a comprehensive MRSA colonization surveillance program. It is not intended to diagnose MRSA infection nor to guide or monitor treatment for MRSA infections. Performed at [facility], [address], [place], [place] [address]   Urine culture     Status: None   Collection Time: 12/01/2018  5:45 PM   Specimen: In/Out Cath Urine  Result Value Ref Range Status   Specimen Description   Final    IN/OUT CATH URINE Performed at [facility], [name], [place], [place] [address]    Special Requests   Final    NONE Performed at [facility], [address], [place], [place] [address]    Culture   Final    NO GROWTH Performed at [facility], [place] [address] [place],  [address]    Report Status 11/18/2018 FINAL  Final     Scheduled Meds: . apixaban  5 mg Oral BID  . arformoterol  15 mcg Nebulization BID  . brimonidine  1 drop Both Eyes BID  . budesonide (PULMICORT) nebulizer solution  0.5 mg Nebulization BID  . chlorhexidine  15 mL Mouth Rinse BID  . chlorhexidine gluconate (MEDLINE KIT)  15 mL Mouth Rinse BID  . Chlorhexidine Gluconate Cloth  6 each Topical Daily  . diltiazem  120 mg Oral QPM  . dorzolamide  1 drop Left Eye BID  . ipratropium  0.5 mg Nebulization TID  . latanoprost  1 drop  Left Eye QHS  . levalbuterol  0.63 mg Nebulization Q4H  . mouth rinse  15 mL Mouth Rinse q12n4p  . mouth rinse  15 mL  Mouth Rinse 10 times per day  . methylPREDNISolone (SOLU-MEDROL) injection  125 mg Intravenous Once  . methylPREDNISolone (SOLU-MEDROL) injection  60 mg Intravenous Q6H   Continuous Infusions: . azithromycin 500 mg (11/18/18 1019)  . ceFEPime (MAXIPIME) IV 2 g (11/19/18 0511)  . propofol (DIPRIVAN) infusion      Procedures/Studies: Dg Chest 2 View  Result Date: 11/15/2018 CLINICAL DATA:  Patient with chest pain. EXAM: CHEST - 2 VIEW COMPARISON:  Chest radiograph 08/16/2018 FINDINGS: Monitoring leads overlie the patient. Stable cardiac and mediastinal contours. Similar-appearing diffuse bilateral interstitial pulmonary opacities. No definite superimposed pulmonary consolidation. No pleural effusion or pneumothorax. IMPRESSION: Similar-appearing diffuse bilateral interstitial pulmonary opacities compatible with interstitial lung disease. Electronically Signed   By: Lovey Newcomer M.D.   On: 11/15/2018 14:53   Dg Chest Portable 1 View  Result Date: 11/21/2018 CLINICAL DATA:  Worsening shortness of breath and weakness. History of pulmonary fibrosis. EXAM: PORTABLE CHEST 1 VIEW COMPARISON:  11/15/2018 FINDINGS: The cardiomediastinal silhouette is unchanged. There is a background of chronic interstitial lung disease. New/increased patchy opacities are present in the right greater than left lung bases and possibly left midlung. No pleural effusion or pneumothorax is identified. No acute osseous abnormality is seen. IMPRESSION: Chronic interstitial lung disease with new/increased patchy opacities in the right greater than left lung bases which may reflect superimposed pneumonia or edema. Electronically Signed   By: Logan Bores M.D.   On: 12/01/2018 09:39   US Abdomen Limited Ruq  Result Date: 11/17/2018 CLINICAL DATA:  Hyperbilirubinemia EXAM: ULTRASOUND ABDOMEN LIMITED RIGHT UPPER  QUADRANT COMPARISON:  CT abdomen and pelvis 05/27/2016 FINDINGS: Gallbladder: Surgically absent, 2014 Common bile duct: Diameter: 3 mm, normal Liver: Heterogeneous hepatic echogenicity. Suboptimal assessment of intrahepatic detail due to sound attenuation. Liver appears slightly nodular in appearance on the prior CT exam, suggesting cirrhosis. Portal vein is patent on color Doppler imaging with normal direction of blood flow towards the liver. Other: No RIGHT upper quadrant free fluid. Marked cortical atrophy of the RIGHT kidney is noted. IMPRESSION: Post cholecystectomy. Heterogeneous hepatic echogenicity, with liver appearing nodular question cirrhotic on the prior CT exam from 2018. No definite hepatic mass or biliary dilatation identified. Cortical atrophy RIGHT kidney. Electronically Signed   By: Lavonia Dana M.D.   On: 11/17/2018 17:30   Irwin Brakeman, MD [facility] How to contact the [facility] Attending or Consulting provider [place] or covering provider during after hours [place], for this patient?  1. Check the care team in [facility] and look for a) attending/consulting [facility] provider listed and b) the [facility] team listed 2. Log into [email or web address] and use Center's universal password to access. If you do not have the password, please contact the hospital operator. 3. Locate the [facility] provider you are looking for under [facility] and page to a number that you can be directly reached. 4. If you still have difficulty reaching the provider, please page the [facility] (Director on Call) for the Hospitalists listed on amion for assistance.   If 7PM-7AM, please contact night-coverage [email or web address] Password [email or web address] 11/19/2018, 7:25 AM   LOS: 3 days

## 2018-11-19 NOTE — Progress Notes (Signed)
Pharmacy Antibiotic Note  Curtis [name] is a 73 y.o. male admitted on 11/10/2018 with pneumonia.  Pharmacy has been consulted for cefepime dosing.  Plan: Continue cefepime 2gm IV q8h Monitor labs, c/s, and patient improvement.   Height: 6\' 1"  (185.4 cm) Weight: 215 lb 9.8 oz (97.8 kg) IBW/kg (Calculated) : 79.9  Temp (24hrs), Avg:98.4 F (36.9 C), Min:98 F (36.7 C), Max:98.9 F (37.2 C)  Recent Labs  Lab 11/15/18 1402 12/02/2018 0909 12/04/2018 1022 11/30/2018 1227 11/17/18 0418 11/18/18 0356  WBC 12.3* 19.6*  --   --  30.1* 27.3*  CREATININE 0.75 0.80  --   --  0.83 0.83  LATICACIDVEN  --   --  3.3* 4.1*  --   --     Estimated Creatinine Clearance: 94.7 mL/min (by C-G formula based on SCr of 0.83 mg/dL).   Normalized CrCL 64mls/min  Allergies  Allergen Reactions  . Pirfenidone Other (See Comments)    Elevated bilirubin  . Acyclovir And Related Other (See Comments)    Unknown  . Oxycodone Itching  . Pravastatin Other (See Comments)    Chills    . Simvastatin Other (See Comments)    Chills and Leg pain  . Prednisone Anxiety and Other (See Comments)    Irritation, Can tolerate in small doses   . Tegretol [Carbamazepine] Other (See Comments)    dizziness    Antimicrobials this admission: Vancomycin 11/9 >> 11/10 Cefepime 11/9 >> Azith 11/10 >> Zosyn x 1 dose in ED 11/9   Microbiology results: 11/9 BCx: ngtd 11/19 UCx: ng  MRSA PCR: negative  Thank you for allowing pharmacy to be a part of this patient's care.  Margot Ables, PharmD Clinical Pharmacist 11/19/2018 10:59 AM

## 2018-11-20 ENCOUNTER — Inpatient Hospital Stay (HOSPITAL_COMMUNITY): Payer: PPO

## 2018-11-20 DIAGNOSIS — J181 Lobar pneumonia, unspecified organism: Secondary | ICD-10-CM

## 2018-11-20 DIAGNOSIS — E440 Moderate protein-calorie malnutrition: Secondary | ICD-10-CM | POA: Insufficient documentation

## 2018-11-20 DIAGNOSIS — J9601 Acute respiratory failure with hypoxia: Secondary | ICD-10-CM

## 2018-11-20 DIAGNOSIS — I4819 Other persistent atrial fibrillation: Secondary | ICD-10-CM

## 2018-11-20 DIAGNOSIS — Z978 Presence of other specified devices: Secondary | ICD-10-CM

## 2018-11-20 DIAGNOSIS — J84112 Idiopathic pulmonary fibrosis: Secondary | ICD-10-CM

## 2018-11-20 LAB — CBC
HCT: 35.9 % — ABNORMAL LOW (ref 39.0–52.0)
Hemoglobin: 11.3 g/dL — ABNORMAL LOW (ref 13.0–17.0)
MCH: 31.6 pg (ref 26.0–34.0)
MCHC: 31.5 g/dL (ref 30.0–36.0)
MCV: 100.3 fL — ABNORMAL HIGH (ref 80.0–100.0)
Platelets: 91 10*3/uL — ABNORMAL LOW (ref 150–400)
RBC: 3.58 MIL/uL — ABNORMAL LOW (ref 4.22–5.81)
RDW: 13.2 % (ref 11.5–15.5)
WBC: 9 10*3/uL (ref 4.0–10.5)
nRBC: 0 % (ref 0.0–0.2)

## 2018-11-20 LAB — POCT I-STAT 7, (LYTES, BLD GAS, ICA,H+H)
Acid-Base Excess: 1 mmol/L (ref 0.0–2.0)
Acid-base deficit: 1 mmol/L (ref 0.0–2.0)
Bicarbonate: 26.8 mmol/L (ref 20.0–28.0)
Bicarbonate: 27.6 mmol/L (ref 20.0–28.0)
Calcium, Ion: 1.35 mmol/L (ref 1.15–1.40)
Calcium, Ion: 1.34 mmol/L (ref 1.15–1.40)
HCT: 31 % — ABNORMAL LOW (ref 39.0–52.0)
HCT: 30 % — ABNORMAL LOW (ref 39.0–52.0)
Hemoglobin: 10.5 g/dL — ABNORMAL LOW (ref 13.0–17.0)
Hemoglobin: 10.2 g/dL — ABNORMAL LOW (ref 13.0–17.0)
O2 Saturation: 95 %
O2 Saturation: 99 %
Patient temperature: 98.7
Patient temperature: 98.7
Potassium: 4.9 mmol/L (ref 3.5–5.1)
Potassium: 4.6 mmol/L (ref 3.5–5.1)
Sodium: 149 mmol/L — ABNORMAL HIGH (ref 135–145)
Sodium: 149 mmol/L — ABNORMAL HIGH (ref 135–145)
TCO2: 29 mmol/L (ref 22–32)
TCO2: 29 mmol/L (ref 22–32)
pCO2 arterial: 57.2 mmHg — ABNORMAL HIGH (ref 32.0–48.0)
pCO2 arterial: 53.2 mmHg — ABNORMAL HIGH (ref 32.0–48.0)
pH, Arterial: 7.28 — ABNORMAL LOW (ref 7.350–7.450)
pH, Arterial: 7.324 — ABNORMAL LOW (ref 7.350–7.450)
pO2, Arterial: 89 mmHg (ref 83.0–108.0)
pO2, Arterial: 172 mmHg — ABNORMAL HIGH (ref 83.0–108.0)

## 2018-11-20 LAB — BASIC METABOLIC PANEL
Anion gap: 10 (ref 5–15)
BUN: 44 mg/dL — ABNORMAL HIGH (ref 8–23)
CO2: 24 mmol/L (ref 22–32)
Calcium: 8.5 mg/dL — ABNORMAL LOW (ref 8.9–10.3)
Chloride: 115 mmol/L — ABNORMAL HIGH (ref 98–111)
Creatinine, Ser: 0.82 mg/dL (ref 0.61–1.24)
GFR calc Af Amer: >60 mL/min (ref >60)
GFR calc non Af Amer: >60 mL/min (ref >60)
Glucose, Bld: 156 mg/dL — ABNORMAL HIGH (ref 70–99)
Potassium: 4.8 mmol/L (ref 3.5–5.1)
Sodium: 149 mmol/L — ABNORMAL HIGH (ref 135–145)

## 2018-11-20 LAB — BRAIN NATRIURETIC PEPTIDE: B Natriuretic Peptide: 201 pg/mL — ABNORMAL HIGH (ref 0.0–100.0)

## 2018-11-20 LAB — GLUCOSE, CAPILLARY
Glucose-Capillary: 126 mg/dL — ABNORMAL HIGH (ref 70–99)
Glucose-Capillary: 129 mg/dL — ABNORMAL HIGH (ref 70–99)
Glucose-Capillary: 136 mg/dL — ABNORMAL HIGH (ref 70–99)
Glucose-Capillary: 136 mg/dL — ABNORMAL HIGH (ref 70–99)
Glucose-Capillary: 139 mg/dL — ABNORMAL HIGH (ref 70–99)
Glucose-Capillary: 154 mg/dL — ABNORMAL HIGH (ref 70–99)

## 2018-11-20 LAB — PHOSPHORUS: Phosphorus: 3.4 mg/dL (ref 2.5–4.6)

## 2018-11-20 LAB — MAGNESIUM: Magnesium: 2.7 mg/dL — ABNORMAL HIGH (ref 1.7–2.4)

## 2018-11-20 MED ORDER — DILTIAZEM HCL 30 MG PO TABS
30.0000 mg | ORAL_TABLET | Freq: Four times a day (QID) | ORAL | Status: DC
  Administered 2018-11-20 – 2018-11-21 (×3): 30 mg via ORAL
  Filled 2018-11-20 (×4): qty 1

## 2018-11-20 MED ORDER — FREE WATER
100.0000 mL | Freq: Three times a day (TID) | Status: DC
  Administered 2018-11-20 – 2018-11-21 (×3): 100 mL

## 2018-11-20 MED ORDER — FAMOTIDINE 40 MG/5ML PO SUSR
20.0000 mg | Freq: Two times a day (BID) | ORAL | Status: DC
  Administered 2018-11-20 – 2018-11-22 (×4): 20 mg
  Filled 2018-11-20 (×4): qty 2.5

## 2018-11-20 MED ORDER — MIDAZOLAM HCL 2 MG/2ML IJ SOLN
2.0000 mg | Freq: Once | INTRAMUSCULAR | Status: AC
  Administered 2018-11-20: 22:00:00 2 mg via INTRAVENOUS
  Filled 2018-11-20: qty 2

## 2018-11-20 MED ORDER — VITAL AF 1.2 CAL PO LIQD
1000.0000 mL | ORAL | Status: DC
  Administered 2018-11-20 (×2): 1000 mL

## 2018-11-20 NOTE — Progress Notes (Signed)
Post change ABG obtained.  Sample obtained on settings of VT of 550, RR of 26, FIO2 of 50%, and PEEP of 10.  Results given to Noe Gens, NP.  No further changes at this time.  Will continue to monitor.    Ref. Range 11/20/2018 09:06  Sample type Unknown ARTERIAL  pH, Arterial Latest Ref Range: 7.350 - 7.450  7.324 (L)  pCO2 arterial Latest Ref Range: 32.0 - 48.0 mmHg 53.2 (H)  pO2, Arterial Latest Ref Range: 83.0 - 108.0 mmHg 172.0 (H)  TCO2 Latest Ref Range: 22 - 32 mmol/L 29  Acid-Base Excess Latest Ref Range: 0.0 - 2.0 mmol/L 1.0  Bicarbonate Latest Ref Range: 20.0 - 28.0 mmol/L 27.6  O2 Saturation Latest Units: % 99.0  Patient temperature Unknown 98.7 F  Collection site Unknown RADIAL, ALLEN'S TEST ACCEPTABLE

## 2018-11-20 NOTE — Progress Notes (Addendum)
NAME:  Curtis [name], MRN:  [id number], DOB:  Apr 02, 1945, LOS: 4 ADMISSION DATE:  11/18/2018, CONSULTATION DATE:  11/19/18 REFERRING MD:  Curtis [name] - [facility]  CHIEF COMPLAINT:  SOB   Brief History   Curtis [name] is a 73 y.o. male with PMH UIP / IPF who was admitted to [facility] 11/9 with acute hypoxic respiratory failure presumed due to HCAP + IPF flare.   Past Medical History  UIP / IPF (followed by Dr. Chase Caller as well as managed at [facility] by Dr. Randol Kern), MI, HTN, HLD (intolerant to statins), A.fib, chest pain GERD, arthritis  Significant Hospital Events   11/09 Admit. 11/12 Intubated, transferred to [facility].  Consults:  PCCM.  Procedures:  ETT 11/12 >>  Significant Diagnostic Tests:  RUQ [place] 11/10 > nodular appearing liver.  Cortical atrophy right kidney. CXR 11/12 > b/l opacities.  Micro Data:  Blood 11/9 >> Urine 11/9 >> neg RVP 11/9 >> neg SARS CoV2 11/9 >> neg.  Antimicrobials:  Vanc 11/9 > 11/10 Zosyn 11/9 x 1 Cefepime 11/9 >> Azithro 11/10 >>  Interim history/subjective:  RN reports levophed weaned off early PM 11/12, VSS.  Tmax 100.2.  WBC 9.  I/O - 1.3L UOP, 1.9L+ for last 24 hours.    Objective:  Blood pressure 116/63, pulse 63, temperature 98.2 F (36.8 C), resp. rate (!) 24, height 6\' 1"  (1.854 m), weight 100.2 kg, SpO2 100 %.    Vent Mode: PRVC FiO2 (%):  [40 %-100 %] 40 % Set Rate:  [24 bmp-27 bmp] 27 bmp Vt Set:  [500 mL-640 mL] 500 mL PEEP:  [5 cmH20-10 cmH20] 10 cmH20 Plateau Pressure:  [23 cmH20-31 cmH20] 31 cmH20   Intake/Output Summary (Last 24 hours) at 11/20/2018 [id number] Last data filed at 11/20/2018 0600 Gross per 24 hour  Intake 3293.81 ml  Output 1329 ml  Net 1964.81 ml   Filed Weights   11/19/18 0500 11/19/18 0800 11/20/18 0500  Weight: 97.5 kg 97.8 kg 100.2 kg    Examination: General: elderly male lying in bed on vent in NAD HEENT: MM pink/moist, ETT, pupils 42mm symmetrical and reactive, anicteric Neuro: sedate, no response to  verbal stimuli CV: s1s2 rrr, SR in 60's on monitor, no m/r/g PULM:  Even/non-labored, lungs bilaterally clear anterior, fine basilar crackles GI: soft, bsx4 active, TF infusing  Extremities: warm/dry, trace BLE edema  Skin: no rashes or lesions  CXR - images reviewed, NGT, ETT in good position, cardiomegaly, diffuse interstitial & alveolar infiltrates  Assessment & Plan:   Acute Hypoxic Respiratory Failure  HAP Felt to be 2/2 IPF flare +/- HCAP. P/F ratio 223 on am ABG -PRVC 7cc/kg  -Wean PEEP / FiO2 for sats >88% -Vent adjusted for am abg, follow up pending  -Follow I/O's, goal negative balance -Intermittent CXR  -SBT/WUA daily as tolerated  -ABX as above, D2/7 -Duoneb Q4  Pulmonary Fibrosis with Acute Flare Followed by Dr. Randol Kern & Dr. Chase Caller.  Last seen in 08/2017 by MR.  Continue solumedrol 60 mg Q12  Hypotension  Suspect element of sedation related (had been on propofol prior to transfer) +/- element of infection with HCAP, resolved -follow in ICU -discontinue levophed from [facility] -follow cultures  Hx HTN, HLD (intolerant to statins), MI, A.fib, chest pain. -continue pre-admit apixaban -hold home diltiazem, consider restart am 11/14    Hypernatremia  -135ml free water Q8  Hx GERD. -continue famotidine  At Risk Malnutrition -TF per Nutrition   Best Practice:  Diet: NPO. Pain/Anxiety/Delirium protocol (if indicated): Precedex  gtt / fentanyl PRN.  RASS goal 0 to -1. VAP protocol (if indicated): In place. DVT prophylaxis: SCD's / Apixaban. GI prophylaxis: Famotidine. Glucose control: SSI. Mobility: Bedrest. Code Status: Full. Family Communication: Wife called am 11/13, updated via phone on plan of care.  Disposition: ICU.  Labs   CBC: Recent Labs  Lab 11/15/18 1402 11/19/2018 0909 11/17/18 0418 11/18/18 0356 11/19/18 1447 11/20/18 0346 11/20/18 0423  WBC 12.3* 19.6* 30.1* 27.3*  --   --  9.0  NEUTROABS  --  16.0*  --  25.7*  --   --   --   HGB  13.8 13.2 13.5 12.5* 10.5* 10.5* 11.3*  HCT 42.9 41.0 41.8 39.3 31.0* 31.0* 35.9*  MCV 97.5 98.8 96.1 98.0  --   --  100.3*  PLT 130* 126* 121* 134*  --   --  91*   Basic Metabolic Panel: Recent Labs  Lab 11/15/18 1402 11/18/2018 0909 11/17/18 0418 11/18/18 0356 11/19/18 1447 11/20/18 0346 11/20/18 0423  NA 138 138 139 142 147* 149* 149*  K 3.7 3.7 4.0 3.9 4.4 4.9 4.8  CL 104 104 104 107  --   --  115*  CO2 26 25 27 26   --   --  24  GLUCOSE 109* 122* 162* 141*  --   --  156*  BUN 11 15 23  34*  --   --  44*  CREATININE 0.75 0.80 0.83 0.83  --   --  0.82  CALCIUM 8.2* 8.3* 9.1 9.1  --   --  8.5*  MG  --   --   --   --   --   --  2.7*  PHOS  --   --   --   --   --   --  3.4   GFR: Estimated Creatinine Clearance: 96.9 mL/min (by C-G formula based on SCr of 0.82 mg/dL). Recent Labs  Lab 11/15/2018 0909 12/06/2018 1022 11/24/2018 1227 11/29/2018 1440 11/17/18 0418 11/18/18 0356 11/20/18 0423  PROCALCITON  --   --   --  0.33 0.56 0.31  --   WBC 19.6*  --   --   --  30.1* 27.3* 9.0  LATICACIDVEN  --  3.3* 4.1*  --   --   --   --    Liver Function Tests: Recent Labs  Lab 11/15/2018 0909 11/17/18 0418 11/18/18 0356  AST 38 53* 53*  ALT 28 26 23   ALKPHOS 105 104 95  BILITOT 5.9* 4.1* 3.1*  PROT 6.6 7.0 6.5  ALBUMIN 2.9* 3.0* 2.8*   No results for input(s): LIPASE, AMYLASE in the last 168 hours. No results for input(s): AMMONIA in the last 168 hours. ABG    Component Value Date/Time   PHART 7.280 (L) 11/20/2018 0346   PCO2ART 57.2 (H) 11/20/2018 0346   PO2ART 89.0 11/20/2018 0346   HCO3 26.8 11/20/2018 0346   TCO2 29 11/20/2018 0346   ACIDBASEDEF 1.0 11/20/2018 0346   O2SAT 95.0 11/20/2018 0346    Coagulation Profile: Recent Labs  Lab 11/24/2018 0909  INR 1.8*   Cardiac Enzymes: No results for input(s): CKTOTAL, CKMB, CKMBINDEX, TROPONINI in the last 168 hours. HbA1C: Hgb A1c MFr Bld  Date/Time Value Ref Range Status  11/18/2018 03:56 AM 4.9 4.8 - 5.6 % Final     Comment:    (NOTE) Pre diabetes:          5.7%-6.4% Diabetes:              >  6.4% Glycemic control for   <7.0% adults with diabetes   06/18/2016 08:44 AM 5.1 4.8 - 5.6 % Final    Comment:             Pre-diabetes: 5.7 - 6.4          Diabetes: >6.4          Glycemic control for adults with diabetes: <7.0    CBG: Recent Labs  Lab 11/19/18 1526 11/19/18 1935 11/19/18 2332 11/20/18 0328 11/20/18 0748  GLUCAP 147* 127* 136* 154* 139*    Critical care time:  35 minutes     Noe Gens, NP-C [facility] 11/20/2018, 7:52 AM

## 2018-11-20 NOTE — Progress Notes (Signed)
RT note: ventilator changes made by Noe Gens, NP.  ABG was ordered for 0800.  Per Brandi, hold on ABG to obtain sample on settings that patient is currently on.  Tolerating ventilator settings well.  Will continue to monitor.

## 2018-11-20 NOTE — Progress Notes (Addendum)
Initial Nutrition Assessment  DOCUMENTATION CODES:   Non-severe (moderate) malnutrition in context of chronic illness  INTERVENTION:   Begin TF via OGT:   Vital AF 1.2 at 20 ml/h, increase by 10 ml every 4 hours to goal rate of 80 ml/h (1920 ml per day)   Provides 2304 kcal, 144 gm protein, 1557 ml free water daily   Monitor electrolytes and replete as needed, as pt is at risk for refeeding syndrome given moderate malnutrition.  NUTRITION DIAGNOSIS:   Moderate Malnutrition related to chronic illness(idiopathic pulmonary fibrosis) as evidenced by moderate fat depletion, moderate muscle depletion, mild muscle depletion.  GOAL:   Patient will meet greater than or equal to 90% of their needs  MONITOR:   Vent status, TF tolerance, Labs, Skin  REASON FOR ASSESSMENT:   Ventilator, Consult Enteral/tube feeding initiation and management  ASSESSMENT:   73 yo male admitted with progressive hypoxic respiratory failure requiring intubation. PMH includes IPF, HTN, HLD, MI, A fib, GERD, asthma.   Received MD Consult for TF initiation and management. OG tube in place.   Patient is currently intubated on ventilator support MV: 14.4 L/min Temp (24hrs), Avg:98.8 F (37.1 C), Min:97.3 F (36.3 C), Max:100.8 F (38.2 C)  Propofol: off   Labs reviewed. Sodium 149 (H), magnesium 2.7 (H) CBG's: [id number]  Medications reviewed and include solu-medrol, precedex, fentanyl.  Usual weights reviewed. Patient with ~8% weight loss within the past 3 months. Weights have fluctuated up and down from ~102-108.9 kg over the past 3 months. Weight fluctuations could be fluid related, however, patient is at least moderately malnourished, given moderate-severe subcutaneous fat depletion and mild-moderate muscle depletion.   NUTRITION - FOCUSED PHYSICAL EXAM:    Most Recent Value  Orbital Region  Severe depletion  Upper Arm Region  Moderate depletion  Thoracic and Lumbar Region  Unable to  assess  Buccal Region  Unable to assess  Temple Region  Moderate depletion  Clavicle Bone Region  Moderate depletion  Clavicle and Acromion Bone Region  Mild depletion  Scapular Bone Region  Unable to assess  Dorsal Hand  Unable to assess  Patellar Region  Mild depletion  Anterior Thigh Region  Mild depletion  Posterior Calf Region  Mild depletion  Edema (RD Assessment)  Mild  Hair  Reviewed  Eyes  Unable to assess  Mouth  Unable to assess  Skin  Reviewed  Nails  Reviewed       Diet Order:   Diet Order            Diet NPO time specified  Diet effective now              EDUCATION NEEDS:   Not appropriate for education at this time  Skin:  Skin Assessment: Skin Integrity Issues: Skin Integrity Issues:: Stage I Stage I: sacrum  Last BM:  11/11  Height:   Ht Readings from Last 1 Encounters:  11/19/18 6\' 1"  (1.854 m)    Weight:   Wt Readings from Last 1 Encounters:  11/20/18 100.2 kg    Ideal Body Weight:  83.6 kg  BMI:  Body mass index is 29.14 kg/m.  Estimated Nutritional Needs:   Kcal:  2335  Protein:  140-160 gm  Fluid:  >/= 2.3 L    Molli Barrows, RD, LDN, [place] Pager [phone] After Hours Pager [phone]

## 2018-11-20 NOTE — Progress Notes (Signed)
[place] Progress Note Patient Name: Keavon Mcvoy DOB: Jan 29, 1945 MRN: [id number]   Date of Service  11/20/2018  HPI/Events of Note  Pt gets agitated with stimulation and nurses need to give him a bath.  eICU Interventions  Versed 2 mg iv x 1 dose just before bathing patient.        Kerry Kass Ogan 11/20/2018, 8:59 PM

## 2018-11-20 NOTE — Progress Notes (Signed)
[facility] Progress Note Patient Name: Curtis [name] DOB: 02/13/1945 MRN: [id number]   Date of Service  11/20/2018  HPI/Events of Note  ABG: PH 7.28, PCO2  57 (baseline PCO2 on admission was 50 with a PH of 7.35)  eICU Interventions  RR on vent increased to 27 targeting a PCO@ of 50 and PH of 7.35 which appear to be his baseline.        Kerry Kass Ogan 11/20/2018, 6:33 AM

## 2018-11-21 ENCOUNTER — Inpatient Hospital Stay (HOSPITAL_COMMUNITY): Payer: PPO

## 2018-11-21 DIAGNOSIS — E440 Moderate protein-calorie malnutrition: Secondary | ICD-10-CM

## 2018-11-21 LAB — CULTURE, BLOOD (ROUTINE X 2)
Culture: NO GROWTH
Culture: NO GROWTH

## 2018-11-21 LAB — BASIC METABOLIC PANEL
Anion gap: 6 (ref 5–15)
BUN: 46 mg/dL — ABNORMAL HIGH (ref 8–23)
CO2: 26 mmol/L (ref 22–32)
Calcium: 8.7 mg/dL — ABNORMAL LOW (ref 8.9–10.3)
Chloride: 119 mmol/L — ABNORMAL HIGH (ref 98–111)
Creatinine, Ser: 0.86 mg/dL (ref 0.61–1.24)
GFR calc Af Amer: >60 mL/min (ref >60)
GFR calc non Af Amer: >60 mL/min (ref >60)
Glucose, Bld: 163 mg/dL — ABNORMAL HIGH (ref 70–99)
Potassium: 4.3 mmol/L (ref 3.5–5.1)
Sodium: 151 mmol/L — ABNORMAL HIGH (ref 135–145)

## 2018-11-21 LAB — POCT I-STAT 7, (LYTES, BLD GAS, ICA,H+H)
Acid-Base Excess: 2 mmol/L (ref 0.0–2.0)
Bicarbonate: 28.2 mmol/L — ABNORMAL HIGH (ref 20.0–28.0)
Calcium, Ion: 1.32 mmol/L (ref 1.15–1.40)
HCT: 34 % — ABNORMAL LOW (ref 39.0–52.0)
Hemoglobin: 11.6 g/dL — ABNORMAL LOW (ref 13.0–17.0)
O2 Saturation: 99 %
Patient temperature: 98.7
Potassium: 4.3 mmol/L (ref 3.5–5.1)
Sodium: 152 mmol/L — ABNORMAL HIGH (ref 135–145)
TCO2: 30 mmol/L (ref 22–32)
pCO2 arterial: 48.2 mmHg — ABNORMAL HIGH (ref 32.0–48.0)
pH, Arterial: 7.375 (ref 7.350–7.450)
pO2, Arterial: 160 mmHg — ABNORMAL HIGH (ref 83.0–108.0)

## 2018-11-21 LAB — CBC
HCT: 36.1 % — ABNORMAL LOW (ref 39.0–52.0)
Hemoglobin: 11.6 g/dL — ABNORMAL LOW (ref 13.0–17.0)
MCH: 31.9 pg (ref 26.0–34.0)
MCHC: 32.1 g/dL (ref 30.0–36.0)
MCV: 99.2 fL (ref 80.0–100.0)
Platelets: 90 10*3/uL — ABNORMAL LOW (ref 150–400)
RBC: 3.64 MIL/uL — ABNORMAL LOW (ref 4.22–5.81)
RDW: 13.2 % (ref 11.5–15.5)
WBC: 9 10*3/uL (ref 4.0–10.5)
nRBC: 0.2 % (ref 0.0–0.2)

## 2018-11-21 LAB — GLUCOSE, CAPILLARY
Glucose-Capillary: 134 mg/dL — ABNORMAL HIGH (ref 70–99)
Glucose-Capillary: 155 mg/dL — ABNORMAL HIGH (ref 70–99)
Glucose-Capillary: 159 mg/dL — ABNORMAL HIGH (ref 70–99)
Glucose-Capillary: 160 mg/dL — ABNORMAL HIGH (ref 70–99)
Glucose-Capillary: 161 mg/dL — ABNORMAL HIGH (ref 70–99)
Glucose-Capillary: 187 mg/dL — ABNORMAL HIGH (ref 70–99)

## 2018-11-21 MED ORDER — ACETAMINOPHEN 325 MG PO TABS: 650.0000 mg | ORAL_TABLET | Freq: Four times a day (QID) | ORAL | Status: DC | PRN

## 2018-11-21 MED ORDER — DILTIAZEM 12 MG/ML ORAL SUSPENSION
60.0000 mg | Freq: Four times a day (QID) | ORAL | Status: DC
  Administered 2018-11-21 – 2018-11-22 (×5): 60 mg
  Filled 2018-11-21 (×6): qty 6

## 2018-11-21 MED ORDER — DILTIAZEM 12 MG/ML ORAL SUSPENSION
30.0000 mg | Freq: Four times a day (QID) | ORAL | Status: DC
  Filled 2018-11-21 (×2): qty 3

## 2018-11-21 MED ORDER — LABETALOL HCL 5 MG/ML IV SOLN
20.0000 mg | INTRAVENOUS | Status: DC | PRN
  Administered 2018-11-22 – 2018-11-23 (×5): 20 mg via INTRAVENOUS
  Filled 2018-11-21 (×5): qty 4

## 2018-11-21 MED ORDER — BISACODYL 5 MG PO TBEC: 5.0000 mg | DELAYED_RELEASE_TABLET | Freq: Every day | ORAL | Status: DC | PRN

## 2018-11-21 MED ORDER — POLYETHYLENE GLYCOL 3350 17 G PO PACK
17.0000 g | PACK | Freq: Every day | ORAL | Status: DC
  Administered 2018-11-21 – 2018-11-27 (×5): 17 g via ORAL
  Filled 2018-11-21 (×6): qty 1

## 2018-11-21 MED ORDER — FUROSEMIDE 10 MG/ML IJ SOLN
40.0000 mg | Freq: Four times a day (QID) | INTRAMUSCULAR | Status: AC
  Administered 2018-11-21 (×2): 40 mg via INTRAVENOUS
  Filled 2018-11-21 (×2): qty 4

## 2018-11-21 MED ORDER — FREE WATER
200.0000 mL | Status: DC
  Administered 2018-11-21 – 2018-11-22 (×7): 200 mL

## 2018-11-21 MED ORDER — QUETIAPINE FUMARATE 50 MG PO TABS
50.0000 mg | ORAL_TABLET | Freq: Two times a day (BID) | ORAL | Status: DC
  Administered 2018-11-21 – 2018-11-22 (×3): 50 mg
  Filled 2018-11-21 (×3): qty 1

## 2018-11-21 NOTE — Progress Notes (Signed)
NAME:  Curtis [name], MRN:  [id number], DOB:  July 31, 1945, LOS: 5 ADMISSION DATE:  11/26/2018, CONSULTATION DATE:  11/12 REFERRING MD:  Wynetta Emery, CHIEF COMPLAINT:  Dyspnea   Brief History   73 y/o male with diffuse parenchymal lung disease of uncertain etiology admitted from [place] on 11/9 with acute hypoxemic respiratory failure presumably due to HCAP vs flare of underlying lung disease. Had recent pericarditis treated with colchicine prior to admission.  Past Medical History  Diffuse parenchymal lung disease, has been labeled IPF but duration of illness and pattern on CT not completely consistent with this Hypertension Hyperlipidemia Atrial fib Chest pain GERD arthritis  Significant Hospital Events   11/9 admission 11/12 intubated, transferred to [facility]  Consults:  PCCM  Procedures:  ETT 11/12 >   Significant Diagnostic Tests:  RUQ [place] 11/10 > nodular appearing liver.  Cortical atrophy right kidney. CXR 11/12 > b/l opacities.  Micro Data:  Blood 11/9 >> Urine 11/9 >> negative RVP 11/9 >> neg SARS CoV2 11/9 >> neg resp culture 11/12 > GPC  Antimicrobials:  Vanc 11/9 > 11/10 Zosyn 11/9 x 1 Cefepime 11/9 >> Azithro 11/10 >>  Interim history/subjective:  Some tachyarrthymias overnight Otherwise no acute events  Objective   Blood pressure (!) 166/60, pulse (!) 113, temperature 98.8 F (37.1 C), resp. rate 16, height 6\' 1"  (1.854 m), weight 100.3 kg, SpO2 96 %.    Vent Mode: PRVC FiO2 (%):  [40 %-50 %] 40 % Set Rate:  [26 bmp] 26 bmp Vt Set:  [550 mL] 550 mL PEEP:  [5 cmH20-10 cmH20] 5 cmH20 Plateau Pressure:  [24 cmH20-31 cmH20] 25 cmH20   Intake/Output Summary (Last 24 hours) at 11/21/2018 1026 Last data filed at 11/21/2018 0900 Gross per 24 hour  Intake 2102.6 ml  Output 1685 ml  Net 417.6 ml   Filed Weights   11/19/18 0800 11/20/18 0500 11/21/18 0332  Weight: 97.8 kg 100.2 kg 100.3 kg    Examination:  General:  In bed on vent HENT: NCAT ETT in  place PULM: CTA B, vent supported breathing CV: RRR, no mgr GI: BS+, soft, nontender MSK: normal bulk and tone Derm: edema legs noted Neuro: sedated on vent   11/14 CXR images personally reviewed> no signficant changes in bilateral airspace disease, ETT in place  Resolved Hospital Problem list     Assessment & Plan:  Acute respiratory failure with hypoxemia: CAP? ILD flare? Acute pulmonary edema? Hoping to see improvement in infiltrates/oxygenation after 3-4 days of steroids Continue cefepime/azithro F/u resp culture Continue solumedrol Continue full vent support VAP prevention Lasix x2 doses Daily SBT/WUA   Need for sedation for mechanical ventilation: Periods of agitation/panic: wife says steroids make him agitated RASS target -1 to -2 Precedex infusion, prn fentanyl per PAD protocol Add seroquel Monitor QTc  Constipation miralax dulcolax  Atrial fibrillation Tele Eliquis Diltiazem  Hypertension, hyperlipidemia Prn labetalol  GERD Famotidine  Hypernatremia: Increase free water  Thrombocytopenia: slowly trending down, uncertain etiology Monitor for bleeding   Best practice:  Diet: tube feeding Pain/Anxiety/Delirium protocol (if indicated): as above VAP protocol (if indicated): yes DVT prophylaxis: eliquis GI prophylaxis: famotidine Glucose control: SSI Mobility: bed rest Code Status: FULL Family Communication: I updated his wife Bethena Roys on  Disposition: remain in ICU  Labs   CBC: Recent Labs  Lab 11/11/2018 0909 11/17/18 0418 11/18/18 0356  11/20/18 0346 11/20/18 0423 11/20/18 0906 11/21/18 0208 11/21/18 0408  WBC 19.6* 30.1* 27.3*  --   --  9.0  --  9.0  --   NEUTROABS 16.0*  --  25.7*  --   --   --   --   --   --   HGB 13.2 13.5 12.5*   < > 10.5* 11.3* 10.2* 11.6* 11.6*  HCT 41.0 41.8 39.3   < > 31.0* 35.9* 30.0* 36.1* 34.0*  MCV 98.8 96.1 98.0  --   --  100.3*  --  99.2  --   PLT 126* 121* 134*  --   --  91*  --  90*  --    < > =  values in this interval not displayed.    Basic Metabolic Panel: Recent Labs  Lab 11/10/2018 0909 11/17/18 0418 11/18/18 0356  11/20/18 0346 11/20/18 0423 11/20/18 0906 11/21/18 0208 11/21/18 0408  NA 138 139 142   < > 149* 149* 149* 151* 152*  K 3.7 4.0 3.9   < > 4.9 4.8 4.6 4.3 4.3  CL 104 104 107  --   --  115*  --  119*  --   CO2 25 27 26   --   --  24  --  26  --   GLUCOSE 122* 162* 141*  --   --  156*  --  163*  --   BUN 15 23 34*  --   --  44*  --  46*  --   CREATININE 0.80 0.83 0.83  --   --  0.82  --  0.86  --   CALCIUM 8.3* 9.1 9.1  --   --  8.5*  --  8.7*  --   MG  --   --   --   --   --  2.7*  --   --   --   PHOS  --   --   --   --   --  3.4  --   --   --    < > = values in this interval not displayed.   GFR: Estimated Creatinine Clearance: 92.5 mL/min (by C-G formula based on SCr of 0.86 mg/dL). Recent Labs  Lab 11/15/2018 1022 11/26/2018 1227 12/04/2018 1440 11/17/18 0418 11/18/18 0356 11/20/18 0423 11/21/18 0208  PROCALCITON  --   --  0.33 0.56 0.31  --   --   WBC  --   --   --  30.1* 27.3* 9.0 9.0  LATICACIDVEN 3.3* 4.1*  --   --   --   --   --     Liver Function Tests: Recent Labs  Lab 11/19/2018 0909 11/17/18 0418 11/18/18 0356  AST 38 53* 53*  ALT 28 26 23   ALKPHOS 105 104 95  BILITOT 5.9* 4.1* 3.1*  PROT 6.6 7.0 6.5  ALBUMIN 2.9* 3.0* 2.8*   No results for input(s): LIPASE, AMYLASE in the last 168 hours. No results for input(s): AMMONIA in the last 168 hours.  ABG    Component Value Date/Time   PHART 7.375 11/21/2018 0408   PCO2ART 48.2 (H) 11/21/2018 0408   PO2ART 160.0 (H) 11/21/2018 0408   HCO3 28.2 (H) 11/21/2018 0408   TCO2 30 11/21/2018 0408   ACIDBASEDEF 1.0 11/20/2018 0346   O2SAT 99.0 11/21/2018 0408     Coagulation Profile: Recent Labs  Lab 11/10/2018 0909  INR 1.8*    Cardiac Enzymes: No results for input(s): CKTOTAL, CKMB, CKMBINDEX, TROPONINI in the last 168 hours.  HbA1C: Hgb A1c MFr Bld  Date/Time Value Ref Range  Status  11/18/2018 03:56 AM 4.9  4.8 - 5.6 % Final    Comment:    (NOTE) Pre diabetes:          5.7%-6.4% Diabetes:              >6.4% Glycemic control for   <7.0% adults with diabetes   06/18/2016 08:44 AM 5.1 4.8 - 5.6 % Final    Comment:             Pre-diabetes: 5.7 - 6.4          Diabetes: >6.4          Glycemic control for adults with diabetes: <7.0     CBG: Recent Labs  Lab 11/20/18 1557 11/20/18 2006 11/20/18 2332 11/21/18 0329 11/21/18 0756  GLUCAP 126* 129* 136* 161* 134*     Critical care time: 35 minutes    Roselie Awkward, MD [place] Pager: [phone] Cell: [phone] If no response, call [phone]

## 2018-11-22 LAB — POCT I-STAT 7, (LYTES, BLD GAS, ICA,H+H)
Acid-Base Excess: 12 mmol/L — ABNORMAL HIGH (ref 0.0–2.0)
Acid-Base Excess: 3 mmol/L — ABNORMAL HIGH (ref 0.0–2.0)
Bicarbonate: 29.1 mmol/L — ABNORMAL HIGH (ref 20.0–28.0)
Bicarbonate: 37.3 mmol/L — ABNORMAL HIGH (ref 20.0–28.0)
Calcium, Ion: 1.23 mmol/L (ref 1.15–1.40)
Calcium, Ion: 1.31 mmol/L (ref 1.15–1.40)
HCT: 31 % — ABNORMAL LOW (ref 39.0–52.0)
HCT: 39 % (ref 39.0–52.0)
Hemoglobin: 10.5 g/dL — ABNORMAL LOW (ref 13.0–17.0)
Hemoglobin: 13.3 g/dL (ref 13.0–17.0)
O2 Saturation: 98 %
O2 Saturation: 98 %
Patient temperature: 97.7
Patient temperature: 98.8
Potassium: 3.9 mmol/L (ref 3.5–5.1)
Potassium: 4.2 mmol/L (ref 3.5–5.1)
Sodium: 150 mmol/L — ABNORMAL HIGH (ref 135–145)
Sodium: 151 mmol/L — ABNORMAL HIGH (ref 135–145)
TCO2: 31 mmol/L (ref 22–32)
TCO2: 39 mmol/L — ABNORMAL HIGH (ref 22–32)
pCO2 arterial: 47.1 mmHg (ref 32.0–48.0)
pCO2 arterial: 50.8 mmHg — ABNORMAL HIGH (ref 32.0–48.0)
pH, Arterial: 7.397 (ref 7.350–7.450)
pH, Arterial: 7.474 — ABNORMAL HIGH (ref 7.350–7.450)
pO2, Arterial: 101 mmHg (ref 83.0–108.0)
pO2, Arterial: 94 mmHg (ref 83.0–108.0)

## 2018-11-22 LAB — BASIC METABOLIC PANEL
Anion gap: 8 (ref 5–15)
BUN: 45 mg/dL — ABNORMAL HIGH (ref 8–23)
CO2: 27 mmol/L (ref 22–32)
Calcium: 8.6 mg/dL — ABNORMAL LOW (ref 8.9–10.3)
Chloride: 117 mmol/L — ABNORMAL HIGH (ref 98–111)
Creatinine, Ser: 0.82 mg/dL (ref 0.61–1.24)
GFR calc Af Amer: >60 mL/min (ref >60)
GFR calc non Af Amer: >60 mL/min (ref >60)
Glucose, Bld: 191 mg/dL — ABNORMAL HIGH (ref 70–99)
Potassium: 3.9 mmol/L (ref 3.5–5.1)
Sodium: 152 mmol/L — ABNORMAL HIGH (ref 135–145)

## 2018-11-22 LAB — CULTURE, RESPIRATORY W GRAM STAIN: Culture: NO GROWTH

## 2018-11-22 LAB — GLUCOSE, CAPILLARY
Glucose-Capillary: 115 mg/dL — ABNORMAL HIGH (ref 70–99)
Glucose-Capillary: 121 mg/dL — ABNORMAL HIGH (ref 70–99)
Glucose-Capillary: 124 mg/dL — ABNORMAL HIGH (ref 70–99)
Glucose-Capillary: 127 mg/dL — ABNORMAL HIGH (ref 70–99)
Glucose-Capillary: 159 mg/dL — ABNORMAL HIGH (ref 70–99)
Glucose-Capillary: 189 mg/dL — ABNORMAL HIGH (ref 70–99)

## 2018-11-22 MED ORDER — AMIODARONE LOAD VIA INFUSION
150.0000 mg | Freq: Once | INTRAVENOUS | Status: DC
  Filled 2018-11-22: qty 83.34

## 2018-11-22 MED ORDER — DILTIAZEM HCL-DEXTROSE 125-5 MG/125ML-% IV SOLN (PREMIX)
5.0000 mg/h | INTRAVENOUS | Status: DC
  Administered 2018-11-22: 23:00:00 15 mg/h via INTRAVENOUS
  Administered 2018-11-22: 16:00:00 10 mg/h via INTRAVENOUS
  Administered 2018-11-23 – 2018-11-26 (×8): 15 mg/h via INTRAVENOUS
  Administered 2018-11-26: 5 mg/h via INTRAVENOUS
  Filled 2018-11-22 (×15): qty 125

## 2018-11-22 MED ORDER — METHYLPREDNISOLONE SODIUM SUCC 125 MG IJ SOLR
40.0000 mg | Freq: Once | INTRAMUSCULAR | Status: AC
  Administered 2018-11-22: 17:00:00 40 mg via INTRAVENOUS
  Filled 2018-11-22: qty 2

## 2018-11-22 MED ORDER — IPRATROPIUM-ALBUTEROL 0.5-2.5 (3) MG/3ML IN SOLN: 3.0000 mL | RESPIRATORY_TRACT | Status: DC | PRN

## 2018-11-22 MED ORDER — LORAZEPAM 2 MG/ML IJ SOLN
0.5000 mg | INTRAMUSCULAR | Status: DC | PRN
  Administered 2018-11-22: 22:00:00 0.5 mg via INTRAVENOUS
  Administered 2018-11-23 – 2018-11-24 (×2): 1 mg via INTRAVENOUS
  Filled 2018-11-22 (×4): qty 1

## 2018-11-22 MED ORDER — AMIODARONE HCL IN DEXTROSE 360-4.14 MG/200ML-% IV SOLN: 60.0000 mg/h | INTRAVENOUS | Status: AC

## 2018-11-22 MED ORDER — CLONAZEPAM 0.5 MG PO TABS
0.5000 mg | ORAL_TABLET | Freq: Two times a day (BID) | ORAL | Status: DC
  Administered 2018-11-22: 0.5 mg via ORAL
  Filled 2018-11-22: qty 1

## 2018-11-22 MED ORDER — AMIODARONE HCL IN DEXTROSE 360-4.14 MG/200ML-% IV SOLN: 30.0000 mg/h | INTRAVENOUS | Status: DC

## 2018-11-22 MED ORDER — METOPROLOL TARTRATE 5 MG/5ML IV SOLN
5.0000 mg | Freq: Four times a day (QID) | INTRAVENOUS | Status: DC
  Administered 2018-11-22 – 2018-11-27 (×16): 5 mg via INTRAVENOUS
  Filled 2018-11-22 (×16): qty 5

## 2018-11-22 MED ORDER — FUROSEMIDE 10 MG/ML IJ SOLN
40.0000 mg | Freq: Four times a day (QID) | INTRAMUSCULAR | Status: AC
  Administered 2018-11-22 (×2): 40 mg via INTRAVENOUS
  Filled 2018-11-22 (×2): qty 4

## 2018-11-22 MED ORDER — PREDNISONE 20 MG PO TABS
20.0000 mg | ORAL_TABLET | Freq: Every day | ORAL | Status: DC
  Administered 2018-11-23: 20 mg
  Filled 2018-11-22: qty 1

## 2018-11-22 NOTE — Progress Notes (Signed)
[facility] Progress Note Patient Name: Curtis [name] DOB: 10-13-1945 MRN: [id number]   Date of Service  11/22/2018  HPI/Events of Note  ILD-PNA, extubated earlier today. RN asking for re intubation. Notes, labs, meds reviewed.  Camera: A fib with rate 115, hypertensive. On nasal o2 sats 96%. Full code as per family.on cardizem gtt. Asking for amiodarone .  As per notes, try not to re intubate due to his IPF, to go on BiPAP first before going for intubation.   Encephalopathy.  eICU Interventions  - to go on BiPAP 15/8/15 for now. Follow ABG - a fib RVR should improve on biPAP. Watch for now. No amiodarone yet. - aspiration precautions - low thresh hold for re intubation if worsening.       Intervention Category Major Interventions: Respiratory failure - evaluation and management  Elmer Sow 11/22/2018, 9:41 PM

## 2018-11-22 NOTE — Progress Notes (Signed)
NAME:  Curtis [name], MRN:  [id number], DOB:  [birth year], LOS: 6 ADMISSION DATE:  11/15/2018, CONSULTATION DATE:  11/12 REFERRING MD:  Wynetta Emery, CHIEF COMPLAINT:  Dyspnea   Brief History   73 y/o male with diffuse parenchymal lung disease of uncertain etiology admitted from [facility] on 11/9 with acute hypoxemic respiratory failure presumably due to HCAP vs flare of underlying lung disease. Had recent pericarditis treated with colchicine prior to admission.  Past Medical History  Diffuse parenchymal lung disease, has been labeled IPF but duration of illness and pattern on CT not completely consistent with this Hypertension Hyperlipidemia Atrial fib Chest pain GERD arthritis  Significant Hospital Events   11/9 admission 11/12 intubated, transferred to [facility] 11/15 extubated  Consults:  PCCM  Procedures:  ETT 11/12 > 11/15  Significant Diagnostic Tests:  RUQ [place] 11/10 > nodular appearing liver.  Cortical atrophy right kidney. CXR 11/12 > b/l opacities.  Micro Data:  Blood 11/9 >> Urine 11/9 >> negative RVP 11/9 >> neg SARS CoV2 11/9 >> neg resp culture 11/12 > GPC  Antimicrobials:  Vanc 11/9 > 11/10 Zosyn 11/9 x 1 Cefepime 11/9 >> 11/15 Azithro 11/10 >> 11/15  Interim history/subjective:   More awake this morning Agitated, follows commands Was in sinus last night while sleeping  Objective   Blood pressure (!) 123/54, pulse 64, temperature 97.6 F (36.4 C), temperature source Oral, resp. rate (!) 9, height 6\' 1"  (1.854 m), weight 100.3 kg, SpO2 100 %.    Vent Mode: PRVC FiO2 (%):  [40 %] 40 % Set Rate:  [26 bmp] 26 bmp Vt Set:  [550 mL] 550 mL PEEP:  [5 cmH20] 5 cmH20 Plateau Pressure:  [20 cmH20-24 cmH20] 22 cmH20   Intake/Output Summary (Last 24 hours) at 11/22/2018 0834 Last data filed at 11/22/2018 0600 Gross per 24 hour  Intake 2969.91 ml  Output 3430 ml  Net -460.09 ml   Filed Weights   11/19/18 0800 11/20/18 0500 11/21/18 0332  Weight: 97.8 kg  100.2 kg 100.3 kg    Examination:  General:  In bed on vent HENT: NCAT ETT in place PULM: CTA B, vent supported breathing CV: Tachy, irregular, no mgr GI: BS+, soft, nontender MSK: normal bulk and tone Neuro: agitated, will nod head appropriately and follow commands   11/14 CXR images personally reviewed> no signficant changes in bilateral airspace disease, ETT in place  Resolved Hospital Problem list     Assessment & Plan:  Acute respiratory failure with hypoxemia: CAP? ILD flare? Acute pulmonary edema? Improving Continue cefepime/azithro 5 days F/U resp culture Change solumedrol to prednisone Extubate today Aspiration precautions after extubation Maintain O2 to maintain O2 saturation > 88%  Need for sedation for mechanical ventilation: Periods of agitation/panic: wife says steroids make him agitated Benzo dependence at home Stop solumedrol Stop sedation protocol Continue seroquel Monitor QTc Add low dose clonazepam Ativan prn Furosemide again today  Constipation miralax Dulcolax  Atrial fibrillation Tele Eliquis Increased dose of eliquis  Hypertension, hyperlipidemia Prn labetalol  GERD: Famotidine  Hypernatremia: Allow free water  Thrombocytopenia: slowly trending down, uncertain etiology Monitor for bleeding   Best practice:  Diet: tube feeding Pain/Anxiety/Delirium protocol (if indicated): as above VAP protocol (if indicated): yes DVT prophylaxis: eliquis GI prophylaxis: famotidine Glucose control: SSI Mobility: bed rest Code Status: FULL Family Communication: I called Bethena Roys and gave her an update Disposition: remain in ICU  Labs   CBC: Recent Labs  Lab 11/15/2018 0909 11/17/18 0418 11/18/18 0356  11/20/18 0423 11/20/18  [id number] 11/21/18 0208 11/21/18 0408 11/22/18 0411  WBC 19.6* 30.1* 27.3*  --  9.0  --  9.0  --   --   NEUTROABS 16.0*  --  25.7*  --   --   --   --   --   --   HGB 13.2 13.5 12.5*   < > 11.3* 10.2* 11.6* 11.6* 10.5*   HCT 41.0 41.8 39.3   < > 35.9* 30.0* 36.1* 34.0* 31.0*  MCV 98.8 96.1 98.0  --  100.3*  --  99.2  --   --   PLT 126* 121* 134*  --  91*  --  90*  --   --    < > = values in this interval not displayed.    Basic Metabolic Panel: Recent Labs  Lab 11/17/18 0418 11/18/18 0356  11/20/18 0423 11/20/18 0906 11/21/18 0208 11/21/18 0408 11/22/18 0301 11/22/18 0411  NA 139 142   < > 149* 149* 151* 152* 152* 151*  K 4.0 3.9   < > 4.8 4.6 4.3 4.3 3.9 3.9  CL 104 107  --  115*  --  119*  --  117*  --   CO2 27 26  --  24  --  26  --  27  --   GLUCOSE 162* 141*  --  156*  --  163*  --  191*  --   BUN 23 34*  --  44*  --  46*  --  45*  --   CREATININE 0.83 0.83  --  0.82  --  0.86  --  0.82  --   CALCIUM 9.1 9.1  --  8.5*  --  8.7*  --  8.6*  --   MG  --   --   --  2.7*  --   --   --   --   --   PHOS  --   --   --  3.4  --   --   --   --   --    < > = values in this interval not displayed.   GFR: Estimated Creatinine Clearance: 97 mL/min (by C-G formula based on SCr of 0.82 mg/dL). Recent Labs  Lab 12/03/2018 1022 11/10/2018 1227 11/28/2018 1440 11/17/18 0418 11/18/18 0356 11/20/18 0423 11/21/18 0208  PROCALCITON  --   --  0.33 0.56 0.31  --   --   WBC  --   --   --  30.1* 27.3* 9.0 9.0  LATICACIDVEN 3.3* 4.1*  --   --   --   --   --     Liver Function Tests: Recent Labs  Lab 11/13/2018 0909 11/17/18 0418 11/18/18 0356  AST 38 53* 53*  ALT 28 26 23   ALKPHOS 105 104 95  BILITOT 5.9* 4.1* 3.1*  PROT 6.6 7.0 6.5  ALBUMIN 2.9* 3.0* 2.8*   No results for input(s): LIPASE, AMYLASE in the last 168 hours. No results for input(s): AMMONIA in the last 168 hours.  ABG    Component Value Date/Time   PHART 7.397 11/22/2018 0411   PCO2ART 47.1 11/22/2018 0411   PO2ART 101.0 11/22/2018 0411   HCO3 29.1 (H) 11/22/2018 0411   TCO2 31 11/22/2018 0411   ACIDBASEDEF 1.0 11/20/2018 0346   O2SAT 98.0 11/22/2018 0411     Coagulation Profile: Recent Labs  Lab 12/03/2018 0909  INR 1.8*     Cardiac Enzymes: No results for input(s): CKTOTAL, CKMB, CKMBINDEX, TROPONINI in the  last 168 hours.  HbA1C: Hgb A1c MFr Bld  Date/Time Value Ref Range Status  11/18/2018 03:56 AM 4.9 4.8 - 5.6 % Final    Comment:    (NOTE) Pre diabetes:          5.7%-6.4% Diabetes:              >6.4% Glycemic control for   <7.0% adults with diabetes   06/18/2016 08:44 AM 5.1 4.8 - 5.6 % Final    Comment:             Pre-diabetes: 5.7 - 6.4          Diabetes: >6.4          Glycemic control for adults with diabetes: <7.0     CBG: Recent Labs  Lab 11/21/18 1607 11/21/18 1957 11/21/18 2352 11/22/18 0347 11/22/18 0727  GLUCAP 155* 159* 187* 189* 159*     Critical care time: 35 minutes    Roselie Awkward, MD [place] PCCM Pager: [phone] Cell: [phone] If no response, call [phone]

## 2018-11-22 NOTE — Progress Notes (Signed)
Amiodarone Drug - Drug Interaction Consult Note  Recommendations: Monitor for QTc prolongation with concomitant quetiapine. Also monitor for bradycardia with metoprolol and diltiazem.   Amiodarone is metabolized by the cytochrome P450 system and therefore has the potential to cause many drug interactions. Amiodarone has an average plasma half-life of 50 days (range 20 to 100 days).   There is potential for drug interactions to occur several weeks or months after stopping treatment and the onset of drug interactions may be slow after initiating amiodarone.   []  Statins: Increased risk of myopathy. Simvastatin- restrict dose to 20mg  daily. Other statins: counsel patients to report any muscle pain or weakness immediately.  []  Anticoagulants: Amiodarone can increase anticoagulant effect. Consider warfarin dose reduction. Patients should be monitored closely and the dose of anticoagulant altered accordingly, remembering that amiodarone levels take several weeks to stabilize.  []  Antiepileptics: Amiodarone can increase plasma concentration of phenytoin, the dose should be reduced. Note that small changes in phenytoin dose can result in large changes in levels. Monitor patient and counsel on signs of toxicity.  [x]  Beta blockers: increased risk of bradycardia, AV block and myocardial depression. Sotalol - avoid concomitant use.  [x]   Calcium channel blockers (diltiazem and verapamil): increased risk of bradycardia, AV block and myocardial depression.  []   Cyclosporine: Amiodarone increases levels of cyclosporine. Reduced dose of cyclosporine is recommended.  []  Digoxin dose should be halved when amiodarone is started.  []  Diuretics: increased risk of cardiotoxicity if hypokalemia occurs.  []  Oral hypoglycemic agents (glyburide, glipizide, glimepiride): increased risk of hypoglycemia. Patient's glucose levels should be monitored closely when initiating amiodarone therapy.   [x]  Drugs that prolong  the QT interval:  Torsades de pointes risk may be increased with concurrent use - avoid if possible.  Monitor QTc, also keep magnesium/potassium WNL if concurrent therapy can't be avoided. Marland Kitchen Antibiotics: e.g. fluoroquinolones, erythromycin. . Antiarrhythmics: e.g. quinidine, procainamide, disopyramide, sotalol. . Antipsychotics: e.g. phenothiazines, haloperidol.  . Lithium, tricyclic antidepressants, and methadone. Thank You,  [place], Rande Lawman  11/22/2018 5:34 PM

## 2018-11-22 NOTE — Progress Notes (Signed)
Pt becoming more labored, tachypneic with RR 30's, & HR becoming more irregular on Cardizem 15mg /hr.  Received order from Dr. Prudencio Burly in [place] to place pt on Bipap and then re-evaluate for Amiodarone.

## 2018-11-22 NOTE — Progress Notes (Signed)
[facility] PCCM  Asked to see patient after extubation  RR 20's, O2 saturation 94% on RA but diaphoretic, tachycardia (atrial fib again) Confused Flushed on exam  Impression: ILD CAP Acute pulmonary edema Atrial fib, worse without dilt in system Acute toxic metabolic encephalopathy  Discussion: Currently doesn't need intubation, but he is in some distress.  He would not do well with re-intubation given his advanced lung disease.  Discussed with wife who will have goals of care conversation with sons. I wonder if some of the diaphoresis is due to the fact that he hasn't had steroids today.  Plan: Solumedrol 40mg  IV x1 Start dilt drip now Amiodarone tonight if afib not better controlled on dilt If resp distress worsens try BIPAP prior to intubation Continue diuresis with lasix F/u with family re goals of care conversation  Additional cc time 40 minutes  Roselie Awkward, MD [place] PCCM Pager: [phone] Cell: [phone] If no response, call [phone]

## 2018-11-22 NOTE — Progress Notes (Signed)
[facility] PCCM  Runs of VT HD stable  Check Mg  Start metoprolol IV scheduled in addition to [place], MD [place] PCCM Pager: [phone] Cell: [phone] If no response, call [phone]

## 2018-11-22 NOTE — Procedures (Signed)
Extubation Procedure Note  Patient Details:   Name: Curtis [name] DOB: 1945-09-16 MRN: [id number]   Airway Documentation:    Vent end date: 11/22/18 Vent end time: 0855   Evaluation  O2 sats: stable throughout Complications: No apparent complications Patient did tolerate procedure well. Bilateral Breath Sounds: Clear, Diminished   Patient extubated to 4L [place] per MD order. Patient able to speak post extubation, but not coughing much.  Kathie Dike 11/22/2018, 9:02 AM

## 2018-11-22 NOTE — Progress Notes (Signed)
Patient extubated this am following three day intubation. Recommend proceeding with bedside swallow evaluation 11/16.  [place] MA, CCC-SLP

## 2018-11-23 DIAGNOSIS — J9621 Acute and chronic respiratory failure with hypoxia: Secondary | ICD-10-CM

## 2018-11-23 LAB — BASIC METABOLIC PANEL
Anion gap: 10 (ref 5–15)
BUN: 48 mg/dL — ABNORMAL HIGH (ref 8–23)
CO2: 32 mmol/L (ref 22–32)
Calcium: 8.9 mg/dL (ref 8.9–10.3)
Chloride: 111 mmol/L (ref 98–111)
Creatinine, Ser: 0.85 mg/dL (ref 0.61–1.24)
GFR calc Af Amer: >60 mL/min (ref >60)
GFR calc non Af Amer: >60 mL/min (ref >60)
Glucose, Bld: 136 mg/dL — ABNORMAL HIGH (ref 70–99)
Potassium: 4.5 mmol/L (ref 3.5–5.1)
Sodium: 153 mmol/L — ABNORMAL HIGH (ref 135–145)

## 2018-11-23 LAB — CBC WITH DIFFERENTIAL/PLATELET
Abs Immature Granulocytes: 0.8 10*3/uL — ABNORMAL HIGH (ref 0.00–0.07)
Basophils Absolute: 0.1 10*3/uL (ref 0.0–0.1)
Basophils Relative: 0 %
Eosinophils Absolute: 0 10*3/uL (ref 0.0–0.5)
Eosinophils Relative: 0 %
HCT: 42.1 % (ref 39.0–52.0)
Hemoglobin: 13.4 g/dL (ref 13.0–17.0)
Immature Granulocytes: 5 %
Lymphocytes Relative: 4 %
Lymphs Abs: 0.8 10*3/uL (ref 0.7–4.0)
MCH: 31.4 pg (ref 26.0–34.0)
MCHC: 31.8 g/dL (ref 30.0–36.0)
MCV: 98.6 fL (ref 80.0–100.0)
Monocytes Absolute: 0.7 10*3/uL (ref 0.1–1.0)
Monocytes Relative: 4 %
Neutro Abs: 15.5 10*3/uL — ABNORMAL HIGH (ref 1.7–7.7)
Neutrophils Relative %: 87 %
Platelets: 97 10*3/uL — ABNORMAL LOW (ref 150–400)
RBC: 4.27 MIL/uL (ref 4.22–5.81)
RDW: 13.4 % (ref 11.5–15.5)
WBC: 17.8 10*3/uL — ABNORMAL HIGH (ref 4.0–10.5)
nRBC: 0.3 % — ABNORMAL HIGH (ref 0.0–0.2)

## 2018-11-23 LAB — GLUCOSE, CAPILLARY
Glucose-Capillary: 124 mg/dL — ABNORMAL HIGH (ref 70–99)
Glucose-Capillary: 126 mg/dL — ABNORMAL HIGH (ref 70–99)
Glucose-Capillary: 128 mg/dL — ABNORMAL HIGH (ref 70–99)
Glucose-Capillary: 128 mg/dL — ABNORMAL HIGH (ref 70–99)
Glucose-Capillary: 131 mg/dL — ABNORMAL HIGH (ref 70–99)
Glucose-Capillary: 138 mg/dL — ABNORMAL HIGH (ref 70–99)

## 2018-11-23 MED ORDER — METHYLPREDNISOLONE SODIUM SUCC 125 MG IJ SOLR: 40.0000 mg | Freq: Every day | INTRAMUSCULAR | Status: DC

## 2018-11-23 MED ORDER — PREDNISONE 20 MG PO TABS: 20.0000 mg | ORAL_TABLET | Freq: Every day | ORAL | Status: DC

## 2018-11-23 MED ORDER — APIXABAN 5 MG PO TABS
5.0000 mg | ORAL_TABLET | Freq: Two times a day (BID) | ORAL | Status: DC
  Administered 2018-11-24 – 2018-11-25 (×3): 5 mg via ORAL
  Filled 2018-11-23 (×3): qty 1

## 2018-11-23 MED ORDER — DEXTROSE 5 % IV SOLN
INTRAVENOUS | Status: DC
  Administered 2018-11-23 – 2018-11-24 (×2): via INTRAVENOUS

## 2018-11-23 NOTE — Progress Notes (Signed)
PT Cancellation Note  Patient Details Name: Curtis [name] MRN: [id number] DOB: July 31, 1945   Cancelled Treatment:    Reason Eval/Treat Not Completed: (P) Medical issues which prohibited therapy RN reports pt respiratory status is tenuous and that activity may jeopardize that. RN request follow back for Evaluation. PT will follow back to determine appropriateness tomorrow.   Aron Needles B. Migdalia Dk PT, DPT Acute Rehabilitation Services Pager [phone] Office [phone]     [place] 11/23/2018, 2:05 PM

## 2018-11-23 NOTE — Progress Notes (Signed)
NAME:  Curtis [name], MRN:  [id number], DOB:  1945-06-22, LOS: 7 ADMISSION DATE:  12/06/2018, CONSULTATION DATE:  11/12 REFERRING MD:  Wynetta Emery, CHIEF COMPLAINT:  Dyspnea   Brief History   73 y/o male with diffuse parenchymal lung disease of uncertain etiology admitted from [facility] on 11/9 with acute hypoxemic respiratory failure presumably due to HCAP vs flare of underlying lung disease. Had recent pericarditis treated with colchicine prior to admission.  Past Medical History  Diffuse parenchymal lung disease, has been labeled IPF but duration of illness and pattern on CT not completely consistent with this Hypertension Hyperlipidemia Atrial fib Chest pain GERD arthritis  Significant Hospital Events   11/9 admission 11/12 intubated, transferred to [facility] 11/15 extubated  Consults:  PCCM  Procedures:  ETT 11/12 > 11/15  Significant Diagnostic Tests:  RUQ [place] 11/10 > nodular appearing liver.  Cortical atrophy right kidney. CXR 11/12 > b/l opacities.  Micro Data:  Blood 11/9 >>  Urine 11/9 >> negative RVP 11/9 >> neg SARS CoV2 11/9 >> neg resp culture 11/12 > GPC  Antimicrobials:  Vanc 11/9 > 11/10 Zosyn 11/9 x 1 Cefepime 11/9 >> 11/15 Azithro 11/10 >> 11/15  Interim history/subjective:  Extubated yesterday. Started on BiPAP overnight for respiratory distress. Atrial fib after extubation. Started on scheduled metoprolol and diltiazem infusion.   Objective   Blood pressure (!) 170/96, pulse 81, temperature (!) 100.4 F (38 C), resp. rate (!) 28, height 6\' 1"  (1.854 m), weight 100.3 kg, SpO2 99 %.    Vent Mode: BIPAP FiO2 (%):  [50 %] 50 % Set Rate:  [15 bmp] 15 bmp   Intake/Output Summary (Last 24 hours) at 11/23/2018 0833 Last data filed at 11/23/2018 0741 Gross per 24 hour  Intake 644.63 ml  Output 3230 ml  Net -2585.37 ml   Filed Weights   11/19/18 0800 11/20/18 0500 11/21/18 0332  Weight: 97.8 kg 100.2 kg 100.3 kg    Examination: General:  Elderly  male on BiPAP in NAD Neuro:  Alert, mild confusion, follows commands.  HEENT:  [place]/AT, No JVD noted, PERRL Cardiovascular:  IRIR rate controlled no MRG Lungs:  Clear bilateral breath sounds Abdomen:  Soft, non-distended, non-tender. Normoactive BS.  Musculoskeletal:  No acute deformity or ROM limitation Skin:  Intact, MMM  Resolved Hospital Problem list     Assessment & Plan:  Acute respiratory failure with hypoxemia: CAP? ILD flare? Acute pulmonary edema? Improving ABX off yesterday F/U resp culture Continue steroids. If unable to take prednisone will change back to solumedrol.  Aspiration precautions Maintain O2 to maintain O2 saturation > 88% Will hold off diuresis today as NA rising. 2L negative for admission.  At risk for re-intubation.   Periods of agitation/panic: wife says steroids make him agitated Benzo dependence at home Continue seroquel Monitor QTc Low dose clonazepam Ativan prn  Constipation miralax Dulcolax  Atrial fibrillation Tele Eliquis (dose increased): if unable to take PO will need to transition to heparin.   Hypertension, hyperlipidemia Prn labetalol  GERD: Famotidine  Hypernatremia: Allow free water Start low rate D5W  Thrombocytopenia: slowly trending down, uncertain etiology Monitor for bleeding   Best practice:  Diet: bedside swallow eval and SLP if needed.  Pain/Anxiety/Delirium protocol (if indicated): as above VAP protocol (if indicated): yes DVT prophylaxis: eliquis GI prophylaxis: famotidine Glucose control: SSI Mobility: bed rest Code Status: FULL Family Communication: Attempted to call wife Bethena Roys. No answer or voicemail box. Will follow up later.  Disposition: If able to remain of BiPAP  can move out of ICU. Will follow up.   Labs   CBC: Recent Labs  Lab 11/22/2018 0909 11/17/18 0418 11/18/18 0356  11/20/18 0423  11/21/18 0208 11/21/18 0408 11/22/18 0411 11/22/18 2349 11/23/18 0332  WBC 19.6* 30.1* 27.3*  --  9.0   --  9.0  --   --   --  17.8*  NEUTROABS 16.0*  --  25.7*  --   --   --   --   --   --   --  15.5*  HGB 13.2 13.5 12.5*   < > 11.3*   < > 11.6* 11.6* 10.5* 13.3 13.4  HCT 41.0 41.8 39.3   < > 35.9*   < > 36.1* 34.0* 31.0* 39.0 42.1  MCV 98.8 96.1 98.0  --  100.3*  --  99.2  --   --   --  98.6  PLT 126* 121* 134*  --  91*  --  90*  --   --   --  97*   < > = values in this interval not displayed.    Basic Metabolic Panel: Recent Labs  Lab 11/18/18 0356  11/20/18 0423  11/21/18 0208 11/21/18 0408 11/22/18 0301 11/22/18 0411 11/22/18 2349 11/23/18 0332  NA 142   < > 149*   < > 151* 152* 152* 151* 150* 153*  K 3.9   < > 4.8   < > 4.3 4.3 3.9 3.9 4.2 4.5  CL 107  --  115*  --  119*  --  117*  --   --  111  CO2 26  --  24  --  26  --  27  --   --  32  GLUCOSE 141*  --  156*  --  163*  --  191*  --   --  136*  BUN 34*  --  44*  --  46*  --  45*  --   --  48*  CREATININE 0.83  --  0.82  --  0.86  --  0.82  --   --  0.85  CALCIUM 9.1  --  8.5*  --  8.7*  --  8.6*  --   --  8.9  MG  --   --  2.7*  --   --   --   --   --   --   --   PHOS  --   --  3.4  --   --   --   --   --   --   --    < > = values in this interval not displayed.   GFR: Estimated Creatinine Clearance: 93.6 mL/min (by C-G formula based on SCr of 0.85 mg/dL). Recent Labs  Lab 11/12/2018 1022 12/05/2018 1227 11/18/2018 1440 11/17/18 0418 11/18/18 0356 11/20/18 0423 11/21/18 0208 11/23/18 0332  PROCALCITON  --   --  0.33 0.56 0.31  --   --   --   WBC  --   --   --  30.1* 27.3* 9.0 9.0 17.8*  LATICACIDVEN 3.3* 4.1*  --   --   --   --   --   --     Liver Function Tests: Recent Labs  Lab 11/26/2018 0909 11/17/18 0418 11/18/18 0356  AST 38 53* 53*  ALT 28 26 23   ALKPHOS 105 104 95  BILITOT 5.9* 4.1* 3.1*  PROT 6.6 7.0 6.5  ALBUMIN 2.9* 3.0* 2.8*   No results for input(s):  LIPASE, AMYLASE in the last 168 hours. No results for input(s): AMMONIA in the last 168 hours.  ABG    Component Value Date/Time   PHART  7.474 (H) 11/22/2018 2349   PCO2ART 50.8 (H) 11/22/2018 2349   PO2ART 94.0 11/22/2018 2349   HCO3 37.3 (H) 11/22/2018 2349   TCO2 39 (H) 11/22/2018 2349   ACIDBASEDEF 1.0 11/20/2018 0346   O2SAT 98.0 11/22/2018 2349     Coagulation Profile: Recent Labs  Lab 11/19/2018 0909  INR 1.8*    Cardiac Enzymes: No results for input(s): CKTOTAL, CKMB, CKMBINDEX, TROPONINI in the last 168 hours.  HbA1C: Hgb A1c MFr Bld  Date/Time Value Ref Range Status  11/18/2018 03:56 AM 4.9 4.8 - 5.6 % Final    Comment:    (NOTE) Pre diabetes:          5.7%-6.4% Diabetes:              >6.4% Glycemic control for   <7.0% adults with diabetes   06/18/2016 08:44 AM 5.1 4.8 - 5.6 % Final    Comment:             Pre-diabetes: 5.7 - 6.4          Diabetes: >6.4          Glycemic control for adults with diabetes: <7.0     CBG: Recent Labs  Lab 11/22/18 1526 11/22/18 1941 11/22/18 2352 11/23/18 0358 11/23/18 0743  GLUCAP 115* 121* 124* 124* 126*     Critical care time: 35 minutes     Georgann Housekeeper, AGACNP-BC [place] Pager [phone] or [phone]  11/23/2018 8:48 AM

## 2018-11-23 NOTE — Progress Notes (Signed)
[facility] Progress Note Patient Name: Curtis [name] DOB: Oct 31, 1945 MRN: [id number]   Date of Service  11/23/2018  HPI/Events of Note  Pt spouse indicated that she conferred with her son's and they decided to reverse Pt's partial code status and make him a full code.  eICU Interventions  Full code order entered after discontinuing his partial code order.        Kerry Kass Nazier Neyhart 11/23/2018, 10:16 PM

## 2018-11-23 NOTE — Progress Notes (Signed)
OT Cancellation Note  Patient Details Name: Curtis [name] MRN: [id number] DOB: 06-17-1945   Cancelled Treatment:    Reason Eval/Treat Not Completed: Medical issues which prohibited therapy. Spoke with RN and pt's breathing is quite tenuous, we will hold and check on pt tomorrow.  [place], OTR/L Acute Rehab Services Pager [phone] Office [phone]     Almon Register 11/23/2018, 1:14 PM

## 2018-11-23 NOTE — Progress Notes (Signed)
RT removed patient from BIPAP and placed on 6l [place]. Vitals stable at this time. Patient is tolerating well.RN aware.  RT will continue to monitor.

## 2018-11-23 NOTE — Progress Notes (Signed)
CCM briefly rounded this am with no new orders received.

## 2018-11-23 NOTE — Progress Notes (Signed)
Dr. Lucile Shutters via [place] made aware of pt's wife's desire to reverse current code status.

## 2018-11-23 NOTE — Progress Notes (Signed)
PCCM INTERVAL PROGRESS NOTE  I was able to get ahold of Mr Masley's wife. I updated her on his current status and we discussed their goals for his care. She understands that given his degree of ILD he is a poor candidate for ongoing aggressive measures. She would like [place] to proceed with intubation should the need arise, but would forego Heroic measures in the setting of a cardiac arrest. Code status updated to limited code.    Georgann Housekeeper, AGACNP-BC [place] Pager [phone] or [phone]  11/23/2018 1:58 PM

## 2018-11-23 NOTE — Progress Notes (Signed)
SLP Cancellation Note  Patient Details Name: Curtis [name] MRN: [id number] DOB: Oct 03, 1945   Cancelled treatment:       Reason Eval/Treat Not Completed: Patient not medically ready - pt with increased WOB this afternoon and recommends holding eval until tomorrow.    Venita Sheffield Damarri Rampy 11/23/2018, 1:55 PM  Pollyann Glen, M.A. [place] Acute Environmental education officer [phone] Office [phone]

## 2018-11-24 ENCOUNTER — Inpatient Hospital Stay (HOSPITAL_COMMUNITY): Payer: PPO

## 2018-11-24 LAB — COMPREHENSIVE METABOLIC PANEL
ALT: 73 U/L — ABNORMAL HIGH (ref 0–44)
AST: 60 U/L — ABNORMAL HIGH (ref 15–41)
Albumin: 2.5 g/dL — ABNORMAL LOW (ref 3.5–5.0)
Alkaline Phosphatase: 95 U/L (ref 38–126)
Anion gap: 9 (ref 5–15)
BUN: 63 mg/dL — ABNORMAL HIGH (ref 8–23)
CO2: 31 mmol/L (ref 22–32)
Calcium: 8.8 mg/dL — ABNORMAL LOW (ref 8.9–10.3)
Chloride: 113 mmol/L — ABNORMAL HIGH (ref 98–111)
Creatinine, Ser: 0.89 mg/dL (ref 0.61–1.24)
GFR calc Af Amer: >60 mL/min (ref >60)
GFR calc non Af Amer: >60 mL/min (ref >60)
Glucose, Bld: 145 mg/dL — ABNORMAL HIGH (ref 70–99)
Potassium: 4.8 mmol/L (ref 3.5–5.1)
Sodium: 153 mmol/L — ABNORMAL HIGH (ref 135–145)
Total Bilirubin: 2.2 mg/dL — ABNORMAL HIGH (ref 0.3–1.2)
Total Protein: 6 g/dL — ABNORMAL LOW (ref 6.5–8.1)

## 2018-11-24 LAB — GLUCOSE, CAPILLARY
Glucose-Capillary: 123 mg/dL — ABNORMAL HIGH (ref 70–99)
Glucose-Capillary: 134 mg/dL — ABNORMAL HIGH (ref 70–99)
Glucose-Capillary: 140 mg/dL — ABNORMAL HIGH (ref 70–99)
Glucose-Capillary: 163 mg/dL — ABNORMAL HIGH (ref 70–99)
Glucose-Capillary: 168 mg/dL — ABNORMAL HIGH (ref 70–99)
Glucose-Capillary: 179 mg/dL — ABNORMAL HIGH (ref 70–99)

## 2018-11-24 LAB — BRAIN NATRIURETIC PEPTIDE: B Natriuretic Peptide: 58.2 pg/mL (ref 0.0–100.0)

## 2018-11-24 LAB — CBC
HCT: 41.6 % (ref 39.0–52.0)
Hemoglobin: 13.1 g/dL (ref 13.0–17.0)
MCH: 31.6 pg (ref 26.0–34.0)
MCHC: 31.5 g/dL (ref 30.0–36.0)
MCV: 100.5 fL — ABNORMAL HIGH (ref 80.0–100.0)
Platelets: 90 10*3/uL — ABNORMAL LOW (ref 150–400)
RBC: 4.14 MIL/uL — ABNORMAL LOW (ref 4.22–5.81)
RDW: 13.7 % (ref 11.5–15.5)
WBC: 20.2 10*3/uL — ABNORMAL HIGH (ref 4.0–10.5)
nRBC: 0.3 % — ABNORMAL HIGH (ref 0.0–0.2)

## 2018-11-24 LAB — HEPATIC FUNCTION PANEL
ALT: 74 U/L — ABNORMAL HIGH (ref 0–44)
AST: 60 U/L — ABNORMAL HIGH (ref 15–41)
Albumin: 2.5 g/dL — ABNORMAL LOW (ref 3.5–5.0)
Alkaline Phosphatase: 85 U/L (ref 38–126)
Bilirubin, Direct: 0.5 mg/dL — ABNORMAL HIGH (ref 0.0–0.2)
Indirect Bilirubin: 2 mg/dL — ABNORMAL HIGH (ref 0.3–0.9)
Total Bilirubin: 2.5 mg/dL — ABNORMAL HIGH (ref 0.3–1.2)
Total Protein: 6 g/dL — ABNORMAL LOW (ref 6.5–8.1)

## 2018-11-24 MED ORDER — POTASSIUM CHLORIDE CRYS ER 20 MEQ PO TBCR: 40.0000 meq | EXTENDED_RELEASE_TABLET | Freq: Once | ORAL | Status: DC

## 2018-11-24 MED ORDER — METHYLPREDNISOLONE SODIUM SUCC 125 MG IJ SOLR
60.0000 mg | Freq: Two times a day (BID) | INTRAMUSCULAR | Status: DC
  Administered 2018-11-24 – 2018-11-27 (×7): 60 mg via INTRAVENOUS
  Filled 2018-11-24 (×7): qty 2

## 2018-11-24 MED ORDER — ACETAMINOPHEN 160 MG/5ML PO SOLN
650.0000 mg | ORAL | Status: DC | PRN
  Administered 2018-11-24 – 2018-11-26 (×2): 650 mg
  Filled 2018-11-24 (×2): qty 20.3

## 2018-11-24 MED ORDER — PRO-STAT SUGAR FREE PO LIQD
30.0000 mL | Freq: Three times a day (TID) | ORAL | Status: DC
  Administered 2018-11-24 – 2018-11-27 (×9): 30 mL
  Filled 2018-11-24 (×9): qty 30

## 2018-11-24 MED ORDER — DEXTROSE 5 % IV SOLN: INTRAVENOUS | Status: DC

## 2018-11-24 MED ORDER — JEVITY 1.2 CAL PO LIQD
1000.0000 mL | ORAL | Status: DC
  Filled 2018-11-24: qty 1000

## 2018-11-24 MED ORDER — JEVITY 1.2 CAL PO LIQD
1000.0000 mL | ORAL | Status: DC
  Administered 2018-11-24: 16:00:00 20 mL/h
  Administered 2018-11-25: 20 mL
  Administered 2018-11-27: 1000 mL
  Filled 2018-11-24 (×4): qty 1000

## 2018-11-24 NOTE — Procedures (Signed)
Cortrak  Person Inserting Tube:  Rosezetta Schlatter, RD Tube Type:  Cortrak - 43 inches Tube Location:  Left nare Initial Placement:  Stomach Secured by: Bridle Technique Used to Measure Tube Placement:  Documented cm marking at nare/ corner of mouth Cortrak Secured At:  76 cm Procedure Comments:  Cortrak Tube Team Note:  Consult received to place a Cortrak feeding tube.   No x-ray is required. RN may begin using tube.    If the tube becomes dislodged please keep the tube and contact the Cortrak team at [email or web address] ([email or web address]) for replacement.  If after hours and replacement cannot be delayed, place a NG tube and confirm placement with an abdominal x-ray.      Jarome Matin, MS, RD, LDN, [facility] Inpatient Clinical Dietitian Pager # [phone] After hours/weekend pager # [phone]

## 2018-11-24 NOTE — Progress Notes (Signed)
SLP Cancellation Note  Patient Details Name: Curtis [name] MRN: [id number] DOB: 20-Sep-1945   Cancelled treatment:       Reason Eval/Treat Not Completed: Patient not medically ready per RN. Cortrak being placed. Will cancel orders at this time and await reorder when appropriate.    Vy Badley, Katherene Ponto 11/24/2018, 10:17 AM

## 2018-11-24 NOTE — Progress Notes (Signed)
  Interdisciplinary Goals of Care Family Meeting   Date carried out:: 11/24/2018  Location of the meeting: 62m14 patient - confernce had with wife in empty room 66m02  Member's involved: Physician and Other: patient wife  Durable Power of Attorney or acting medical decision maker: Wife -   But makes decisions after talking to sons  Discussion: We discussed goals of care for [employer] .  - she reported that end of oct 2020 he stayed at brothers house and bed room had some smell. Later 2 weeks ago or so he worked heavily redoing bathroom and breaking tiles. Since then downhill resuling in hypoxemia and worsenin dyspnea and itubation  Explained features c/w ILD/IPF flare and this has 90% - 3 month mortality after admission and 50% - 30d mortality.   Discussed best recommendation as aim to get him better with o2 steroids, bipap to go home at newer lower baseline for remainder of natural life.   Discussed that if declines comfort is best option . I do not recommend intubation and cpr  Overall, concurrent palliation and active medical care short of intubation and cpr and transition to comfort if declines    Code status: Full Code . She is in agreement with above but needs to talk and get buy-in from soms  Disposition: Continue current acute care  Time spent for the meeting: 15 minutes    SIGNATURE    Dr. Brand Males, M.D., F.C.C.P,  Pulmonary and Critical Care Medicine Staff Physician, [place] Director - Interstitial Lung Disease  Program  Pulmonary [place] [facility], [place], [address]  Pager: [phone], If no answer or between  15:00h - 7:00h: call [phone] Telephone: [phone]  6:38 PM 11/24/2018    Brand Males 11/24/2018, 6:13 PM

## 2018-11-24 NOTE — Progress Notes (Signed)
[facility] Progress Note Patient Name: Zakhary Elena DOB: 1945/04/19 MRN: [id number]   Date of Service  11/24/2018  HPI/Events of Note  Blood sugar 168 mg %, Pt is on dextrose containing iv fluids as well as enteral nutrition.  eICU Interventions  Discontinue iv fluids.        Kerry Kass Jamine Wingate 11/24/2018, 9:03 PM

## 2018-11-24 NOTE — Progress Notes (Signed)
Pt transferred to and from CT without incident.

## 2018-11-24 NOTE — Progress Notes (Signed)
PT Cancellation Note  Patient Details Name: Curtis [name] MRN: [id number] DOB: 1945/09/15   Cancelled Treatment:    Reason Eval/Treat Not Completed: (P) Medical issues which prohibited therapy Spoke with RN and pt is on Bipap today 50% not ready for therapy evals. Will continue to follow for appropriateness  Wilmina Maxham B. Migdalia Dk PT, DPT Acute Rehabilitation Services Pager [phone] Office [phone]  [place] 11/24/2018, 11:51 AM

## 2018-11-24 NOTE — Progress Notes (Signed)
NAME:  Curtis [name], MRN:  [id number], DOB:  30-Jun-1945, LOS: 8 ADMISSION DATE:  11/15/2018, CONSULTATION DATE:  11/12 REFERRING MD:  Wynetta Emery, CHIEF COMPLAINT:  Dyspnea   Brief History   73 y/o male with diffuse parenchymal lung disease of uncertain etiology admitted from [facility] on 11/9 with acute hypoxemic respiratory failure presumably due to HCAP vs flare of underlying lung disease. Had recent pericarditis treated with colchicine prior to admission.  Past Medical History  Diffuse parenchymal lung disease, has been labeled IPF but duration of illness and pattern on CT not completely consistent with this Hypertension Hyperlipidemia Atrial fib Chest pain GERD arthritis  Significant Hospital Events   11/9 admission 11/12 intubated, transferred to [facility] 11/15 extubated 11/16 - Extubated yesterday. Started on BiPAP overnight for respiratory distress. Atrial fib after extubation. Started on scheduled metoprolol and diltiazem infusion.   Consults:  PCCM  Procedures:  ETT 11/12 > 11/15  Significant Diagnostic Tests:  RUQ [place] 11/10 > nodular appearing liver.  Cortical atrophy right kidney. CXR 11/12 > b/l opacities. CT chest wo contrast - bilat HC on UL but with superimposed diffused GGO  Micro Data:  Blood 11/9 >>  Urine 11/9 >> negative RVP 11/9 >> neg SARS CoV2 11/9 >> neg resp culture 11/12 > GPC  Antimicrobials:  Vanc 11/9 > 11/10 Zosyn 11/9 x 1 Cefepime 11/9 >> 11/15 Azithro 11/10 >> 11/15  Interim history/subjective:    11/17 - made no cpr but yes for intubation yesterday evening and by night fall made full code again by family. Currently on BiPAP. CT chest c/w ILD flare - personally visualized. LFT slightly worse. RN says he has been on bipap since yesterday afternoon . Still drowsy despite stopping klonopin - Na 153 despite d5w at 50cc/h. On cardizem gtt   Objective   Blood pressure 130/88, pulse 94, temperature (!) 100.8 F (38.2 C), resp. rate (!) 34, height  6\' 1"  (1.854 m), weight 100.3 kg, SpO2 100 %.    Vent Mode: BIPAP FiO2 (%):  [40 %-50 %] 40 % Set Rate:  [15 bmp] 15 bmp PEEP:  [6 cmH20] 6 cmH20   Intake/Output Summary (Last 24 hours) at 11/24/2018 0806 Last data filed at 11/24/2018 0600 Gross per 24 hour  Intake 1429.36 ml  Output 3040 ml  Net -1610.64 ml   Filed Weights   11/19/18 0800 11/20/18 0500 11/21/18 0332  Weight: 97.8 kg 100.2 kg 100.3 kg    Examination: General Appearance:  Look drowsy. On BiPAP Head:  Normocephalic, without obvious abnormality, atraumatic Eyes:  PERRL - yes, conjunctiva/corneas - muddy     Ears:  Normal external ear canals, both ears Nose:  G tube - no Throat:  ETT TUBE - no , OG tube - no. Has BiPAP on Neck:  Supple,  No enlargement/tenderness/nodules Lungs: TAchypenic, distant, not paradoxical. No wheeze. No crackles anteriorly Heart:  S1 and S2 normal, no murmur, CVP - no.  Pressors - no Abdomen:  Soft, no masses, no organomegaly Genitalia / Rectal:  Not done Extremities:  Extremities- intact Skin:  ntact in exposed areas . Sacral area - not examined Neurologic:  Sedation - none -> RASS - 2/-3 . Moves all 4s - yes. CAM-ICU - cannot assess . Orientation - cannot assess      Resolved Hospital Problem list     Assessment & Plan:  Acute respiratory failure with hypoxemia in background of ILD (likely IPF baseed on clinical suspicion)  11/24/2018 - behaving like ILD flare . Extubated  but bipap dependent   Plan - Bipap  - keep pulse ox > 88%  - holding diureses due to hypernatremia  - IV steroids empiric - increase  Periods of agitation/panic: wife says steroids make him agitated Benzo dependence at home  11/24/2018 - acute encephalopathy with drowsiness despite off klonopin x 24h. Could be residual benzo effect or High Na  Plan Dc ativan prn Dc seroquel Precedex gtt if needed Monitor QTc prn  Constipation miralax Dulcolax  Atrial fibrillation Tele Eliquis (dose  increased): if unable to take PO will need to transition to heparin.  Cardizem gtt  Hypertension, hyperlipidemia Prn labetalol  GERD: Famotidine  Hypernatremia:  11/24/2018 - worse  plan Allow free water -  Increase D5W 50 -> 75cc/h   Thrombocytopenia: slowly trending down, uncertain etiology  Plan Monitor for bleeding (esp with eliquis) Dc anticog if platelet drop < 80  Transaminitis  11/24/2018 - worse  Plan Dc tylenol prn monitor  Best practice:  Diet: NPO. Place cortrak and start tube feeds Pain/Anxiety/Delirium protocol (if indicated): as above VAP protocol (if indicated): yes DVT prophylaxis: eliquis GI prophylaxis: famotidine Glucose control: SSI Mobility: bed rest Code Status: FULL  Family Communication:  Family back and forth with code status. Dr Golden Pop opinion is that this is ILD flare and has 90%, 3 month mortality rate. DNI/No CPR is in his best interest. Wife's cell phone/home did not have message option and work phone did not connect. Palliative consult called   Dispo: icu . Needs palliative consult  LABS    PULMONARY Recent Labs  Lab 11/20/18 0346 11/20/18 0906 11/21/18 0408 11/22/18 0411 11/22/18 2349  PHART 7.280* 7.324* 7.375 7.397 7.474*  PCO2ART 57.2* 53.2* 48.2* 47.1 50.8*  PO2ART 89.0 172.0* 160.0* 101.0 94.0  HCO3 26.8 27.6 28.2* 29.1* 37.3*  TCO2 29 29 30 31  39*  O2SAT 95.0 99.0 99.0 98.0 98.0    CBC Recent Labs  Lab 11/21/18 0208  11/22/18 2349 11/23/18 0332 11/24/18 0350  HGB 11.6*   < > 13.3 13.4 13.1  HCT 36.1*   < > 39.0 42.1 41.6  WBC 9.0  --   --  17.8* 20.2*  PLT 90*  --   --  97* 90*   < > = values in this interval not displayed.    COAGULATION No results for input(s): INR in the last 168 hours.  CARDIAC  No results for input(s): TROPONINI in the last 168 hours. No results for input(s): PROBNP in the last 168 hours.   CHEMISTRY Recent Labs  Lab 11/20/18 0423  11/21/18 0208  11/22/18 0301  11/22/18 0411 11/22/18 2349 11/23/18 0332 11/24/18 0350  NA 149*   < > 151*   < > 152* 151* 150* 153* 153*  K 4.8   < > 4.3   < > 3.9 3.9 4.2 4.5 4.8  CL 115*  --  119*  --  117*  --   --  111 113*  CO2 24  --  26  --  27  --   --  32 31  GLUCOSE 156*  --  163*  --  191*  --   --  136* 145*  BUN 44*  --  46*  --  45*  --   --  48* 63*  CREATININE 0.82  --  0.86  --  0.82  --   --  0.85 0.89  CALCIUM 8.5*  --  8.7*  --  8.6*  --   --  8.9 8.8*  MG 2.7*  --   --   --   --   --   --   --   --   PHOS 3.4  --   --   --   --   --   --   --   --    < > = values in this interval not displayed.   Estimated Creatinine Clearance: 89.4 mL/min (by C-G formula based on SCr of 0.89 mg/dL).   LIVER Recent Labs  Lab 11/18/18 0356 11/24/18 0350  AST 53* 60*  ALT 23 73*  ALKPHOS 95 95  BILITOT 3.1* 2.2*  PROT 6.5 6.0*  ALBUMIN 2.8* 2.5*     INFECTIOUS Recent Labs  Lab 11/18/18 0356  PROCALCITON 0.31     ENDOCRINE CBG (last 3)  Recent Labs    11/23/18 2323 11/24/18 0327 11/24/18 0746  GLUCAP 138* 123* 134*         IMAGING x48h  - image(s) personally visualized  -   highlighted in bold Ct Chest Wo Contrast  Result Date: 11/24/2018 CLINICAL DATA:  Follow-up interstitial lung disease. Dyspnea and chest pain. Inpatient. EXAM: CT CHEST WITHOUT CONTRAST TECHNIQUE: Multidetector CT imaging of the chest was performed following the standard protocol without IV contrast. COMPARISON:  09/11/2016 high-resolution chest CT. FINDINGS: Cardiovascular: Mild cardiomegaly. No significant pericardial effusion/thickening. Three-vessel coronary atherosclerosis. Atherosclerotic nonaneurysmal thoracic aorta. Top-normal caliber main pulmonary artery (3.2 cm diameter). Mediastinum/Nodes: No discrete thyroid nodules. Fluid level in the thoracic esophagus. No axillary adenopathy. Stable mildly enlarged 1.2 cm low right paratracheal node (series 3/image 57). No new pathologically enlarged mediastinal  nodes. No discrete hilar adenopathy on these noncontrast images. Lungs/Pleura: No pneumothorax. No pleural effusion. There is new extensive patchy ground-glass opacity throughout both lungs superimposed on chronic patchy confluent subpleural reticulation, ground-glass attenuation, traction bronchiectasis and moderate honeycombing. No clear apicobasilar gradient to the underlying fibrotic findings, noting that the honeycombing is most prominent in upper lobes. The underlying honeycombing and bronchiectasis has not appreciably progressed in the interval. No lung masses or significant pulmonary nodules. Upper abdomen: Cholecystectomy. Musculoskeletal:  No aggressive appearing focal osseous lesions. IMPRESSION: 1. New extensive patchy ground-glass opacity throughout both lungs superimposed on chronic fibrotic interstitial lung disease with honeycombing, previously favored to represent UIP. Differential for the new ground-glass opacity is broad and includes pulmonary edema, IPF flare, atypical infection, drug toxicity and other causes. 2. Mild cardiomegaly.  Three-vessel coronary atherosclerosis. 3. Chronic mild mediastinal lymphadenopathy is unchanged and most compatible with benign reactive etiology. Aortic Atherosclerosis (ICD10-I70.0). Electronically Signed   By: Ilona Sorrel M.D.   On: 11/24/2018 07:56   Am Dg Chest Port 1 View  Result Date: 11/24/2018 CLINICAL DATA:  Hypoxia EXAM: PORTABLE CHEST 1 VIEW COMPARISON:  November 21, 2018 FINDINGS: Endotracheal tube and nasogastric tube have been removed. No evident pneumothorax. There remains interstitial fibrosis throughout the lungs bilaterally without significant change. There is no frank edema or consolidation. Heart is mildly enlarged with pulmonary vascularity normal. No adenopathy. There is aortic atherosclerosis. No bone lesions. IMPRESSION: No pneumothorax. Coarse interstitial opacities consistent with fibrosis remain. No new opacity evident. No frank  airspace consolidation. Stable cardiac prominence. Aortic Atherosclerosis (ICD10-I70.0). Electronically Signed   By: Lowella Grip III M.D.   On: 11/24/2018 06:59

## 2018-11-24 NOTE — Progress Notes (Signed)
[facility] Progress Note Patient Name: Curtis [name] DOB: Dec 09, 1945 MRN: [id number]   Date of Service  11/24/2018  HPI/Events of Note  Pt with fever.  eICU Interventions  Tylenol liquid 650 mg via NG tube Q 4 hours prn fever.        Kerry Kass Ogan 11/24/2018, 8:08 PM

## 2018-11-24 NOTE — Progress Notes (Signed)
Nutrition Follow-up  DOCUMENTATION CODES:   Non-severe (moderate) malnutrition in context of chronic illness  INTERVENTION:   Begin TF via Cortrak:   Jevity 1.2 at 20 ml/h, keep at trickle rate today   When able, increase by 10 ml every 4 hours to goal rate of 70 ml/h   Pro-stat 30 ml TID   Provides 2316 kcal, 138 gm protein, 1361 ml free water daily   NUTRITION DIAGNOSIS:   Moderate Malnutrition related to chronic illness(idiopathic pulmonary fibrosis) as evidenced by moderate fat depletion, moderate muscle depletion, mild muscle depletion.  Ongoing  GOAL:   Patient will meet greater than or equal to 90% of their needs   Progressing  MONITOR:   TF tolerance, Labs, Skin, Diet advancement  ASSESSMENT:   73 yo male admitted with progressive hypoxic respiratory failure requiring intubation. PMH includes IPF, HTN, HLD, MI, A fib, GERD, asthma.   Extubated 11/15. Remains NPO, not ready for swallow evaluation at this time.  Per discussion with MD, okay to begin trickle TF. Cortrak was placed this morning, tip in the stomach.    Labs reviewed. Sodium 153 (H) CBG's: [id number]  Medications reviewed.  Weight down 8.6 kg since admission. I/O -3.6 L since admission   Diet Order:   Diet Order            Diet NPO time specified Except for: Sips with Meds  Diet effective now              EDUCATION NEEDS:   Not appropriate for education at this time  Skin:  Skin Assessment: Skin Integrity Issues: Skin Integrity Issues:: Stage I Stage I: sacrum  Last BM:  11/11  Height:   Ht Readings from Last 1 Encounters:  11/19/18 6\' 1"  (1.854 m)    Weight:   Wt Readings from Last 1 Encounters:  11/21/18 100.3 kg    Ideal Body Weight:  83.6 kg  BMI:  Body mass index is 29.17 kg/m.  Estimated Nutritional Needs:   Kcal:  [id number]  Protein:  130-140 gm  Fluid:  >/= 2.3 L    Molli Barrows, RD, LDN, [place] Pager [phone] After Hours Pager  [phone]

## 2018-11-24 NOTE — Progress Notes (Signed)
[facility] Cancellation Note  Patient Details Name: Curtis [name] MRN: [id number] DOB: 08-01-1945   Cancelled Treatment:    Reason Eval/Treat Not Completed: Medical issues which prohibited therapy. Spoke with RN and pt is on Bipap today 50% not ready for therapy evals. Will continue to follow for appropriateness.  [place], OTR/L Acute Rehab Services Pager [phone] Office [phone]     Almon Register 11/24/2018, 9:18 AM

## 2018-11-25 ENCOUNTER — Inpatient Hospital Stay (HOSPITAL_COMMUNITY): Payer: PPO

## 2018-11-25 LAB — GLUCOSE, CAPILLARY
Glucose-Capillary: 144 mg/dL — ABNORMAL HIGH (ref 70–99)
Glucose-Capillary: 172 mg/dL — ABNORMAL HIGH (ref 70–99)
Glucose-Capillary: 147 mg/dL — ABNORMAL HIGH (ref 70–99)
Glucose-Capillary: 146 mg/dL — ABNORMAL HIGH (ref 70–99)
Glucose-Capillary: 158 mg/dL — ABNORMAL HIGH (ref 70–99)
Glucose-Capillary: 155 mg/dL — ABNORMAL HIGH (ref 70–99)
Glucose-Capillary: 131 mg/dL — ABNORMAL HIGH (ref 70–99)

## 2018-11-25 LAB — CBC WITH DIFFERENTIAL/PLATELET
Abs Immature Granulocytes: 0.49 10*3/uL — ABNORMAL HIGH (ref 0.00–0.07)
Basophils Absolute: 0 10*3/uL (ref 0.0–0.1)
Basophils Relative: 0 %
Eosinophils Absolute: 0 10*3/uL (ref 0.0–0.5)
Eosinophils Relative: 0 %
HCT: 39 % (ref 39.0–52.0)
Hemoglobin: 12.4 g/dL — ABNORMAL LOW (ref 13.0–17.0)
Immature Granulocytes: 3 %
Lymphocytes Relative: 5 %
Lymphs Abs: 0.9 10*3/uL (ref 0.7–4.0)
MCH: 31.8 pg (ref 26.0–34.0)
MCHC: 31.8 g/dL (ref 30.0–36.0)
MCV: 100 fL (ref 80.0–100.0)
Monocytes Absolute: 0.3 10*3/uL (ref 0.1–1.0)
Monocytes Relative: 2 %
Neutro Abs: 18.1 10*3/uL — ABNORMAL HIGH (ref 1.7–7.7)
Neutrophils Relative %: 90 %
Platelets: 76 10*3/uL — ABNORMAL LOW (ref 150–400)
RBC: 3.9 MIL/uL — ABNORMAL LOW (ref 4.22–5.81)
RDW: 13.4 % (ref 11.5–15.5)
WBC: 19.9 10*3/uL — ABNORMAL HIGH (ref 4.0–10.5)
nRBC: 0.4 % — ABNORMAL HIGH (ref 0.0–0.2)

## 2018-11-25 LAB — BASIC METABOLIC PANEL
Anion gap: 8 (ref 5–15)
BUN: 66 mg/dL — ABNORMAL HIGH (ref 8–23)
CO2: 30 mmol/L (ref 22–32)
Calcium: 8.7 mg/dL — ABNORMAL LOW (ref 8.9–10.3)
Chloride: 114 mmol/L — ABNORMAL HIGH (ref 98–111)
Creatinine, Ser: 1.13 mg/dL (ref 0.61–1.24)
GFR calc Af Amer: >60 mL/min (ref >60)
GFR calc non Af Amer: >60 mL/min (ref >60)
Glucose, Bld: 166 mg/dL — ABNORMAL HIGH (ref 70–99)
Potassium: 5 mmol/L (ref 3.5–5.1)
Sodium: 152 mmol/L — ABNORMAL HIGH (ref 135–145)

## 2018-11-25 LAB — PROCALCITONIN: Procalcitonin: 0.12 ng/mL

## 2018-11-25 LAB — MAGNESIUM: Magnesium: 2.6 mg/dL — ABNORMAL HIGH (ref 1.7–2.4)

## 2018-11-25 LAB — PROTIME-INR
INR: 1.9 — ABNORMAL HIGH (ref 0.8–1.2)
Prothrombin Time: 21.5 seconds — ABNORMAL HIGH (ref 11.4–15.2)

## 2018-11-25 LAB — LACTIC ACID, PLASMA: Lactic Acid, Venous: 2 mmol/L (ref 0.5–1.9)

## 2018-11-25 LAB — STREP PNEUMONIAE URINARY ANTIGEN: Strep Pneumo Urinary Antigen: NEGATIVE

## 2018-11-25 LAB — PHOSPHORUS: Phosphorus: 3.5 mg/dL (ref 2.5–4.6)

## 2018-11-25 MED ORDER — VANCOMYCIN HCL 10 G IV SOLR
2000.0000 mg | Freq: Once | INTRAVENOUS | Status: AC
  Administered 2018-11-25: 2000 mg via INTRAVENOUS
  Filled 2018-11-25: qty 2000

## 2018-11-25 MED ORDER — VANCOMYCIN HCL 10 G IV SOLR
2000.0000 mg | INTRAVENOUS | Status: DC
  Filled 2018-11-25: qty 2000

## 2018-11-25 MED ORDER — FREE WATER
300.0000 mL | Freq: Four times a day (QID) | Status: DC
  Administered 2018-11-25 – 2018-11-27 (×8): 300 mL

## 2018-11-25 MED ORDER — INSULIN ASPART 100 UNIT/ML ~~LOC~~ SOLN: 0.0000 [IU] | Freq: Three times a day (TID) | SUBCUTANEOUS | Status: DC

## 2018-11-25 MED ORDER — INSULIN ASPART 100 UNIT/ML ~~LOC~~ SOLN
2.0000 [IU] | SUBCUTANEOUS | Status: DC
  Administered 2018-11-25 (×2): 2 [IU] via SUBCUTANEOUS
  Administered 2018-11-25 (×3): 4 [IU] via SUBCUTANEOUS
  Administered 2018-11-25 (×2): 2 [IU] via SUBCUTANEOUS
  Administered 2018-11-26 (×2): 4 [IU] via SUBCUTANEOUS
  Administered 2018-11-26 (×3): 2 [IU] via SUBCUTANEOUS
  Administered 2018-11-27 (×3): 4 [IU] via SUBCUTANEOUS

## 2018-11-25 MED ORDER — SODIUM CHLORIDE 0.9 % IV SOLN
2.0000 g | Freq: Two times a day (BID) | INTRAVENOUS | Status: DC
  Administered 2018-11-25 – 2018-11-27 (×4): 2 g via INTRAVENOUS
  Filled 2018-11-25 (×5): qty 2

## 2018-11-25 NOTE — Progress Notes (Signed)
PT Cancellation Note  Patient Details Name: Curtis [name] MRN: [id number] DOB: 02-11-1945   Cancelled Treatment:    Reason Eval/Treat Not Completed: (P) Medical issues which prohibited therapy Pt continues to be on BiPAP with little improvement. Due to pt's current medical status over the last 3 days, PT is signing off. Please reorder therapy when pt becomes medically appropriate. Thanks.  Kiven Vangilder B. Migdalia Dk PT, DPT Acute Rehabilitation Services Pager [phone] Office [phone]    [place] 11/25/2018, 7:58 AM

## 2018-11-25 NOTE — Progress Notes (Signed)
[facility] Progress Note Patient Name: Zelma Fiene DOB: 1945-04-03 MRN: [id number]   Date of Service  11/25/2018  HPI/Events of Note  Hyperglycemia while on steroids for IPF  eICU Interventions  Phase 1 hyperglycemia SQ insulin orders entered.        Kerry Kass Ogan 11/25/2018, 12:47 AM

## 2018-11-25 NOTE — Plan of Care (Signed)

## 2018-11-25 NOTE — Progress Notes (Signed)
NAME:  Curtis [name], MRN:  [id number], DOB:  1946/01/01, LOS: 9 ADMISSION DATE:  11/29/2018, CONSULTATION DATE:  11/12 REFERRING MD:  Wynetta Emery, CHIEF COMPLAINT:  Dyspnea   Brief History   73 y/o male with diffuse parenchymal lung disease of uncertain etiology admitted from [place] on 11/9 with acute hypoxemic respiratory failure presumably due to HCAP vs flare of underlying lung disease. Had recent pericarditis treated with colchicine prior to admission.  Past Medical History  Diffuse parenchymal lung disease, has been labeled IPF but duration of illness and pattern on CT not completely consistent with this Hypertension Hyperlipidemia Atrial fib Chest pain GERD arthritis  Significant Hospital Events   11/9 admission 11/12 intubated, transferred to [facility] 11/15 extubated 11/16 - Extubated yesterday. Started on BiPAP overnight for respiratory distress. Atrial fib after extubation. Started on scheduled metoprolol and diltiazem infusion.  11/17 - made no cpr but yes for intubation yesterday evening and by night fall made full code again by family. Currently on BiPAP. CT chest c/w ILD flare - personally visualized. LFT slightly worse. RN says he has been on bipap since yesterday afternoon . Still drowsy despite stopping klonopin - Na 153 despite d5w at 50cc/h. On cardizem gtt   Consults:  PCCM  Procedures:  ETT 11/12 > 11/15  Significant Diagnostic Tests:  RUQ [place] 11/10 > nodular appearing liver.  Cortical atrophy right kidney. CXR 11/12 > b/l opacities. CT chest wo contrast - bilat HC on UL but with superimposed diffused GGO  Micro Data:  Blood 11/9 >>  Urine 11/9 >> negative RVP 11/9 >> neg SARS CoV2 11/9 >> neg resp culture 11/12 > GPC  Antimicrobials:  Vanc 11/9 > 11/10 Zosyn 11/9 x 1 Cefepime 11/9 >> 11/15 Azithro 11/10 >> 11/15 [id number] Vanc 11/18 >> Cefepime 11/18 >>  Interim history/subjective:   11/25/2018 -= Goals of care with wife at bedside  yesterday. This morning she called and agreed to No CPR. No intubation but still try to get him better with goals of going home. Wife aware of high 3 month mortality and newer lower baseline quality of life. . If declines move to comfort. She wants sons to visit. Currently hypernatremic Na 153, creat up at 1.1 and drowsy. Platelts worse at [place]. Remains on cardizem gtt and TF  WAs on BiPAP at night. Now 6L [place]  FEbrile 101.5 and rising and wbc 20k   Objective   Blood pressure (!) 144/68, pulse 91, temperature (!) 100.6 F (38.1 C), temperature source Core, resp. rate (!) 27, height 6\' 1"  (1.854 m), weight 94.7 kg, SpO2 100 %.    Vent Mode: BIPAP FiO2 (%):  [40 %] 40 % Set Rate:  [15 bmp] 15 bmp   Intake/Output Summary (Last 24 hours) at 11/25/2018 1023 Last data filed at 11/25/2018 1000 Gross per 24 hour  Intake 1641.78 ml  Output 1865 ml  Net -223.22 ml   Filed Weights   11/20/18 0500 11/21/18 0332 11/25/18 0400  Weight: 100.2 kg 100.3 kg 94.7 kg    General Appearance:  Looks chronic critically ill. Deconditioned. Frail . Drowsy Head:  Normocephalic, without obvious abnormality, atraumatic Eyes:  PERRL - yes, conjunctiva/corneas - muddy     Ears:  Normal external ear canals, both ears Nose:  G tube - no but has [place] Throat:  ETT TUBE - no , OG tube - no Neck:  Supple,  No enlargement/tenderness/nodules Lungs: Distant and mildly tachypneic. Some crack;es Heart:  S1 and S2 normal, no murmur, CVP -  no.  Pressors - no Abdomen:  Soft, no masses, no organomegaly Genitalia / Rectal:  Not done Extremities:  Extremities- intact Skin:  ntact in exposed areas . Sacral area - not examined Neurologic:  Sedation - none -> RASS - -3/-3 equivalent . Moves all 4s - yes. CAM-ICU - cannot assess . Orientation - cannot assess         Resolved Hospital Problem list     Assessment & Plan:  Acute respiratory failure with hypoxemia in background of ILD (likely IPF baseed on clinical  suspicion)  11/25/2018 - behaving like ILD flare . 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  Likely now to residual effect of benzozo effect or High Na and possible sepsis since 11/17 (fever)  Plan Hold  ativan prn hold seroquel Correct  High Na with free water and see if mental status improves  Constipation miralax Dulcolax  Atrial fibrillation = - Tele  11/25/2018 - on cardizem gtt .. HR 104/min  Plan - Stop Eliquis due to low platelets (dose increased): if unable to take PO will need to transition to heparin.  - Cardizem gtt to continue  Hypertension, hyperlipidemia Prn labetalol  GERD: Famotidine  Hypernatremia:  11/25/2018 - worse. D5 stopped last night due to hyperglycemia  plan - start free water  Anemia critical illness  Pl,an  -= - PRBC for hgb </= 6.9gm%    - exceptions are   -  if ACS susepcted/confirmed then transfuse for hgb </= 8.0gm%,  or    -  active bleeding with hemodynamic instability, then transfuse regardless of hemoglobin value   At at all times try to transfuse 1 unit prbc as possible with exception of active hemorrhage    Thrombocytopenia: slowly trending down, uncertain etiology  Plan Monitor for bleeding (esp with eliquis) DC eliquis  Transaminitis with mild jaundice  11/25/2018 - worse  Plan Dc tylenol prn Monitor Get RUQ [place]  Best practice:  Diet: NPO. Place cortrak and start tube  feeds Pain/Anxiety/Delirium protocol (if indicated): as above VAP protocol (if indicated): yes DVT prophylaxis: eliquis GI prophylaxis: famotidine Glucose control: SSI Mobility: bed rest Code Status: Limited code. [place] for pressors   Dispo: icu .Cancel palliative consult - limited code. I think we can reverse sepsis and high Na and see if he can be well enough to go home +/- home hospice with new baseline. This is primary goal. Ok for bipap and pressors.  Move to progressive. TRH primary from 11/26/18 and ccm consult     ATTESTATION & SIGNATURE   The patient Than Forsey is critically ill with multiple organ systems failure and requires high complexity decision making for assessment and support, frequent evaluation and titration of therapies, application of advanced monitoring technologies and extensive interpretation of multiple databases.   Critical Care Time devoted to patient care  services described in this note is  30  Minutes. This time reflects time of care of this signee Dr Brand Males. This critical care time does not reflect procedure time, or teaching time or supervisory time of PA/NP/Med student/Med Resident etc but could involve care discussion time     Dr. Brand Males, M.D., [facility].C.P Pulmonary and Critical Care Medicine Staff Physician [facility] Pager: [phone], If no answer or between  15:00h - 7:00h: call [phone]  11/25/2018 11:04 AM    LABS    PULMONARY Recent Labs  Lab 11/20/18 0346 11/20/18 0906 11/21/18 0408 11/22/18 0411 11/22/18 2349  PHART 7.280* 7.324* 7.375 7.397 7.474*  PCO2ART 57.2* 53.2* 48.2* 47.1 50.8*  PO2ART 89.0 172.0* 160.0* 101.0 94.0  HCO3 26.8 27.6 28.2* 29.1* 37.3*  TCO2 29 29 30 31  39*  O2SAT 95.0 99.0 99.0 98.0 98.0    CBC Recent Labs  Lab 11/23/18 0332 11/24/18 0350 11/25/18 0049  HGB 13.4 13.1 12.4*  HCT 42.1 41.6 39.0  WBC 17.8* 20.2* 19.9*   PLT 97* 90* 76*    COAGULATION Recent Labs  Lab 11/25/18 0049  INR 1.9*    CARDIAC  No results for input(s): TROPONINI in the last 168 hours. No results for input(s): PROBNP in the last 168 hours.   CHEMISTRY Recent Labs  Lab 11/20/18 0423  11/21/18 0208  11/22/18 0301 11/22/18 0411 11/22/18 2349 11/23/18 0332 11/24/18 0350 11/25/18 0049  NA 149*   < > 151*   < > 152* 151* 150* 153* 153* 152*  K 4.8   < > 4.3   < > 3.9 3.9 4.2 4.5 4.8 5.0  CL 115*  --  119*  --  117*  --   --  111 113* 114*  CO2 24  --  26  --  27  --   --  32 31 30  GLUCOSE 156*  --  163*  --  191*  --   --  136* 145* 166*  BUN 44*  --  46*  --  45*  --   --  48* 63* 66*  CREATININE 0.82  --  0.86  --  0.82  --   --  0.85 0.89 1.13  CALCIUM 8.5*  --  8.7*  --  8.6*  --   --  8.9 8.8* 8.7*  MG 2.7*  --   --   --   --   --   --   --   --  2.6*  PHOS 3.4  --   --   --   --   --   --   --   --  3.5   < > = values in this interval not displayed.   Estimated Creatinine Clearance: 63.8 mL/min (by C-G formula based on SCr of 1.13 mg/dL).   LIVER Recent Labs  Lab 11/24/18 0350 11/25/18 0049  AST 60*  60*  --   ALT 74*  73*  --   ALKPHOS 85  95  --   BILITOT 2.5*  2.2*  --   PROT 6.0*  6.0*  --   ALBUMIN 2.5*  2.5*  --   INR  --  1.9*     INFECTIOUS No results for input(s): LATICACIDVEN, PROCALCITON in the last 168 hours.   ENDOCRINE CBG (last 3)  Recent Labs    11/25/18 0113 11/25/18 0422 11/25/18 0738  GLUCAP 144* 172* 147*  IMAGING x48h  - image(s) personally visualized  -   highlighted in bold Ct Chest Wo Contrast  Result Date: 11/24/2018 CLINICAL DATA:  Follow-up interstitial lung disease. Dyspnea and chest pain. Inpatient. EXAM: CT CHEST WITHOUT CONTRAST TECHNIQUE: Multidetector CT imaging of the chest was performed following the standard protocol without IV contrast. COMPARISON:  09/11/2016 high-resolution chest CT. FINDINGS: Cardiovascular: Mild cardiomegaly.  No significant pericardial effusion/thickening. Three-vessel coronary atherosclerosis. Atherosclerotic nonaneurysmal thoracic aorta. Top-normal caliber main pulmonary artery (3.2 cm diameter). Mediastinum/Nodes: No discrete thyroid nodules. Fluid level in the thoracic esophagus. No axillary adenopathy. Stable mildly enlarged 1.2 cm low right paratracheal node (series 3/image 57). No new pathologically enlarged mediastinal nodes. No discrete hilar adenopathy on these noncontrast images. Lungs/Pleura: No pneumothorax. No pleural effusion. There is new extensive patchy ground-glass opacity throughout both lungs superimposed on chronic patchy confluent subpleural reticulation, ground-glass attenuation, traction bronchiectasis and moderate honeycombing. No clear apicobasilar gradient to the underlying fibrotic findings, noting that the honeycombing is most prominent in upper lobes. The underlying honeycombing and bronchiectasis has not appreciably progressed in the interval. No lung masses or significant pulmonary nodules. Upper abdomen: Cholecystectomy. Musculoskeletal:  No aggressive appearing focal osseous lesions. IMPRESSION: 1. New extensive patchy ground-glass opacity throughout both lungs superimposed on chronic fibrotic interstitial lung disease with honeycombing, previously favored to represent UIP. Differential for the new ground-glass opacity is broad and includes pulmonary edema, IPF flare, atypical infection, drug toxicity and other causes. 2. Mild cardiomegaly.  Three-vessel coronary atherosclerosis. 3. Chronic mild mediastinal lymphadenopathy is unchanged and most compatible with benign reactive etiology. Aortic Atherosclerosis (ICD10-I70.0). Electronically Signed   By: Ilona Sorrel M.D.   On: 11/24/2018 07:56   Am Dg Chest Port 1 View  Result Date: 11/24/2018 CLINICAL DATA:  Hypoxia EXAM: PORTABLE CHEST 1 VIEW COMPARISON:  November 21, 2018 FINDINGS: Endotracheal tube and nasogastric tube have been  removed. No evident pneumothorax. There remains interstitial fibrosis throughout the lungs bilaterally without significant change. There is no frank edema or consolidation. Heart is mildly enlarged with pulmonary vascularity normal. No adenopathy. There is aortic atherosclerosis. No bone lesions. IMPRESSION: No pneumothorax. Coarse interstitial opacities consistent with fibrosis remain. No new opacity evident. No frank airspace consolidation. Stable cardiac prominence. Aortic Atherosclerosis (ICD10-I70.0). Electronically Signed   By: Lowella Grip III M.D.   On: 11/24/2018 06:59

## 2018-11-25 NOTE — Plan of Care (Signed)
TRH pickup patient on 11/19 from PCCM.  See TRH communication for further details.

## 2018-11-25 NOTE — Progress Notes (Signed)
Floor RT called and given report, aware of bipap order.

## 2018-11-25 NOTE — Progress Notes (Signed)
OT Cancellation Note  Patient Details Name: Larz Bissette MRN: [id number] DOB: 05-16-1945   Cancelled Treatment:    Reason Eval/Treat Not Completed: Patient not medically ready Pt continues to be on BiPAP with little improvement. Due to pt's current medical status over the last 3 days, Ot to sign off acutely. Please reorder when appropriate for evaluation.   Billey Chang, OTR/L  Acute Rehabilitation Services Pager: [phone] Office: [phone] .  11/25/2018, 8:01 AM

## 2018-11-25 NOTE — Progress Notes (Signed)
Patient removed from BIPAP and placed on 6l [place]. Patient is tolerating well. RT will continue to monitor.

## 2018-11-25 NOTE — Progress Notes (Signed)
Pharmacy Antibiotic Note  Curtis [name] is a 73 y.o. male admitted on 11/26/2018 with sepsis.  Pharmacy has been consulted for cefepime and vancomycin dosing.  Pt with history of idiopathic pulmonary fibrosis but per CCM duration of illness and pattern on CT are not completely consistent with this. New onset WBC elevation, pt febrile, suspicion for sepsis secondary to PNA.   Pt received broad spectrum IV ABX in past week including cefepime and azithromycin which concluded on 11/15.  Plan: Initiate cefepime 2g IV q12h. Initiate vancomycin 2000mg  IV bolus x 1. Will continue vancomycin 2000mg  IV every 24h.  Goal AUC 400-550. Expected AUC: 511 SCr used: 1.13 Will f/u renal function, micro data, and pt's clinical condition   Height: 6\' 1"  (185.4 cm) Weight: 208 lb 12.4 oz (94.7 kg) IBW/kg (Calculated) : 79.9  Temp (24hrs), Avg:100.9 F (38.3 C), Min:99.1 F (37.3 C), Max:102 F (38.9 C)  Recent Labs  Lab 11/20/18 0423 11/21/18 0208 11/22/18 0301 11/23/18 0332 11/24/18 0350 11/25/18 0049  WBC 9.0 9.0  --  17.8* 20.2* 19.9*  CREATININE 0.82 0.86 0.82 0.85 0.89 1.13    Estimated Creatinine Clearance: 63.8 mL/min (by C-G formula based on SCr of 1.13 mg/dL).    Allergies  Allergen Reactions  . Pirfenidone Other (See Comments)    Elevated bilirubin  . Acyclovir And Related Other (See Comments)    Unknown  . Oxycodone Itching  . Pravastatin Other (See Comments)    Chills    . Simvastatin Other (See Comments)    Chills and Leg pain  . Prednisone Anxiety and Other (See Comments)    Irritation, Can tolerate in small doses   . Tegretol [Carbamazepine] Other (See Comments)    dizziness    Thank you for allowing pharmacy to be a part of this patient's care.  Onnie Boer 11/25/2018 12:03 PM

## 2018-11-26 DIAGNOSIS — E870 Hyperosmolality and hypernatremia: Secondary | ICD-10-CM

## 2018-11-26 LAB — COMPREHENSIVE METABOLIC PANEL
ALT: 148 U/L — ABNORMAL HIGH (ref 0–44)
AST: 169 U/L — ABNORMAL HIGH (ref 15–41)
Albumin: 2.4 g/dL — ABNORMAL LOW (ref 3.5–5.0)
Alkaline Phosphatase: 100 U/L (ref 38–126)
Anion gap: 12 (ref 5–15)
BUN: 88 mg/dL — ABNORMAL HIGH (ref 8–23)
CO2: 29 mmol/L (ref 22–32)
Calcium: 9.1 mg/dL (ref 8.9–10.3)
Chloride: 116 mmol/L — ABNORMAL HIGH (ref 98–111)
Creatinine, Ser: 1.83 mg/dL — ABNORMAL HIGH (ref 0.61–1.24)
GFR calc Af Amer: 41 mL/min — ABNORMAL LOW (ref >60)
GFR calc non Af Amer: 35 mL/min — ABNORMAL LOW (ref >60)
Glucose, Bld: 152 mg/dL — ABNORMAL HIGH (ref 70–99)
Potassium: 5 mmol/L (ref 3.5–5.1)
Sodium: 157 mmol/L — ABNORMAL HIGH (ref 135–145)
Total Bilirubin: 2.2 mg/dL — ABNORMAL HIGH (ref 0.3–1.2)
Total Protein: 5.3 g/dL — ABNORMAL LOW (ref 6.5–8.1)

## 2018-11-26 LAB — URINE CULTURE: Culture: NO GROWTH

## 2018-11-26 LAB — CBC WITH DIFFERENTIAL/PLATELET
Abs Immature Granulocytes: 0.82 10*3/uL — ABNORMAL HIGH (ref 0.00–0.07)
Basophils Absolute: 0.1 10*3/uL (ref 0.0–0.1)
Basophils Relative: 0 %
Eosinophils Absolute: 0 10*3/uL (ref 0.0–0.5)
Eosinophils Relative: 0 %
HCT: 37.1 % — ABNORMAL LOW (ref 39.0–52.0)
Hemoglobin: 11.5 g/dL — ABNORMAL LOW (ref 13.0–17.0)
Immature Granulocytes: 3 %
Lymphocytes Relative: 6 %
Lymphs Abs: 1.5 10*3/uL (ref 0.7–4.0)
MCH: 31.8 pg (ref 26.0–34.0)
MCHC: 31 g/dL (ref 30.0–36.0)
MCV: 102.5 fL — ABNORMAL HIGH (ref 80.0–100.0)
Monocytes Absolute: 1 10*3/uL (ref 0.1–1.0)
Monocytes Relative: 4 %
Neutro Abs: 24 10*3/uL — ABNORMAL HIGH (ref 1.7–7.7)
Neutrophils Relative %: 87 %
Platelets: 90 10*3/uL — ABNORMAL LOW (ref 150–400)
RBC: 3.62 MIL/uL — ABNORMAL LOW (ref 4.22–5.81)
RDW: 13.4 % (ref 11.5–15.5)
WBC: 27.4 10*3/uL — ABNORMAL HIGH (ref 4.0–10.5)
nRBC: 1.3 % — ABNORMAL HIGH (ref 0.0–0.2)

## 2018-11-26 LAB — PROCALCITONIN: Procalcitonin: 0.32 ng/mL

## 2018-11-26 LAB — LEGIONELLA PNEUMOPHILA SEROGP 1 UR AG: L. pneumophila Serogp 1 Ur Ag: NEGATIVE

## 2018-11-26 LAB — GLUCOSE, CAPILLARY
Glucose-Capillary: 139 mg/dL — ABNORMAL HIGH (ref 70–99)
Glucose-Capillary: 140 mg/dL — ABNORMAL HIGH (ref 70–99)
Glucose-Capillary: 121 mg/dL — ABNORMAL HIGH (ref 70–99)
Glucose-Capillary: 164 mg/dL — ABNORMAL HIGH (ref 70–99)
Glucose-Capillary: 163 mg/dL — ABNORMAL HIGH (ref 70–99)

## 2018-11-26 LAB — PROTIME-INR
INR: 2 — ABNORMAL HIGH (ref 0.8–1.2)
Prothrombin Time: 22.9 seconds — ABNORMAL HIGH (ref 11.4–15.2)

## 2018-11-26 LAB — MAGNESIUM: Magnesium: 2.9 mg/dL — ABNORMAL HIGH (ref 1.7–2.4)

## 2018-11-26 LAB — PHOSPHORUS: Phosphorus: 3.2 mg/dL (ref 2.5–4.6)

## 2018-11-26 MED ORDER — DEXTROSE 5 % IV SOLN
INTRAVENOUS | Status: DC
  Administered 2018-11-26 – 2018-11-27 (×2): via INTRAVENOUS

## 2018-11-26 MED ORDER — VANCOMYCIN HCL 10 G IV SOLR
1250.0000 mg | INTRAVENOUS | Status: DC
  Administered 2018-11-26: 1250 mg via INTRAVENOUS
  Filled 2018-11-26 (×3): qty 1250

## 2018-11-26 MED ORDER — DEXTROSE 5 % IV SOLN: INTRAVENOUS | Status: DC

## 2018-11-26 NOTE — Progress Notes (Signed)
PROGRESS NOTE   Curtis [name]  [id number]    DOB: October 11, 1945    DOA: 11/12/2018  PCP: Kathyrn Drown, MD   I have briefly reviewed patients previous medical records in [facility]  Chief Complaint  Patient presents with  . Shortness of Breath    Brief Narrative:  PCCM transfer to [facility] 30/62: 73 year old married male with PMH of diffuse parenchymal lung disease suspected to be ILD, HTN, HLD, A. fib, GERD, recent pericarditis treated with colchicine, admitted from [facility] on 11/9 with acute hypoxic respiratory failure presumably due to community-acquired pneumonia versus ILD flare.  Intubated/11/12, extubated 11/15, PCCM discussed extensively with family regarding [place], transitioned to partial code/BiPAP only and transferred to progressive unit.  Worsening hypernatremia/acute kidney injury, initiated IV D5 infusion.  Overall poor prognosis.   Assessment & Plan:   Active Problems:   IPF (idiopathic pulmonary fibrosis) (HCC)   Persistent atrial fibrillation   Atrial fibrillation, persistent   Acute respiratory failure with hypoxia (HCC)   Sepsis due to undetermined organism (HCC)   Lobar pneumonia (HCC)   Pressure injury of skin   Endotracheal tube present   Malnutrition of moderate degree   Acute on chronic respiratory failure with hypoxia (HCC)   Acute respiratory failure with hypoxia  Suspected due to healthcare associated pneumonia versus ILD flare.  Intubated on 11/12, extubated 11/15.  PCCM discussed with spouse regarding goals of care and now BiPAP only.  BiPAP as needed.  Oxygen supplementation to maintain sats between 88-92%.  RN reports that he is currently on oxygen 5 L/min.  Acute metabolic encephalopathy  Likely multifactorial due to acute kidney injury, hypernatremia and hypoxia.  All sedatives/opiates have been discontinued.  Treat metabolic abnormalities and hope for improvement.  Per spouse, less responsive compared to  yesterday.  Acute kidney injury   creatinine has gone up from 0.82 days ago to 1.83.  Serum sodium has gone up from 1 52-1 57  IV D5 infusion.  Follow BMP in a.m.  Hypernatremic dehydration  Suspected secondary to poor oral intake.  Continue IV D5 infusion and free water via core track.  Sepsis due to Healthcare associated pneumonia/possible aspiration pneumonia  Cultures thus far including urine culture, tracheal aspirate culture and blood cultures, negative to date.  Continue empiric cefepime and vancomycin.  High risk for aspiration pneumonia.  Aspiration precautions and aggressive pulmonary toilet.  Spiked fever of 101.8 overnight  ILD flare  Per CCM, on IV Solu-Medrol 60 mg every 12 hourly.  Agitation/panic/benzodiazepine dependent at home  Benzodiazepines on hold due to mental status changes.  A. fib, paroxysmal  Telemetry shows sinus rhythm with frequent PACs.  Patient currently is on IV Cardizem at 15 mg/h, has not received last couple doses of IV metoprolol due to soft blood pressures.  Discussed with RN, continue to wean off of Cardizem and IV metoprolol with holding parameters.  Essential hypertension  Soft blood pressures.  Continue IV fluids.  Hyperlipidemia  GERD  Thrombocytopenia  Unclear etiology.?  Acute illness/sepsis.  Platelets stable in the 90s over the last several days.  Holding Lovenox.  SCDs for DVT prophylaxis.  Consider resuming Eliquis which was held.  Mild transaminitis/cirrhosis  Unclear etiology.  RUQ ultrasound shows cirrhosis, no ascites, s/p cholecystectomy  Body mass index is 27.54 kg/m.  Nutritional Status Nutrition Problem: Moderate Malnutrition Etiology: chronic illness(idiopathic pulmonary fibrosis) Signs/Symptoms: moderate fat depletion, moderate muscle depletion, mild muscle depletion Interventions: Tube feeding  DVT prophylaxis: SCDs Code Status: Partial/BiPAP only  Family Communication: Discussed in detail  with patient spouse, updated care and answered questions.  Advised her regarding poor/guarded prognosis. Disposition: To be determined.   Consultants:  PCCM.  Procedures:  11/12 intubated, transferred to [facility] 11/15 extubated 11/17 on BiPAP >  Antimicrobials:  As above.   Subjective: Patient seen this morning.  Extremely ill looking.  Was on BiPAP with shallow breath sounds, tachypneic.  Unresponsive.  Eyes open but not tracking.  Nonverbal.  Did not follow any instructions.  Objective:  Vitals:   11/26/18 1022 11/26/18 1024 11/26/18 1157 11/26/18 1200  BP: 99/66 (!) 93/59 (!) 92/59 (!) 102/54  Pulse: 94 93 (!) 108 80  Resp: (!) 40 (!) 40  (!) 35  Temp:  (!) 101.8 F (38.8 C)  (!) 100.6 F (38.1 C)  TempSrc:  Axillary  Axillary  SpO2: 99% 98%  99%  Weight:      Height:        Examination:  General exam: Elderly male, moderately built and frail, ill looking lying propped up in bed with BiPAP on this morning, shallow but fast breathing. Respiratory system: Reduced breath sounds bilaterally.  Scattered occasional crackles without rhonchi.  Increased work of breathing. Cardiovascular system: S1 & S2 heard, RRR. No JVD, murmurs, rubs, gallops or clicks. No pedal edema.  Telemetry personally reviewed: Sinus rhythm with frequent PACs Gastrointestinal system: Abdomen is nondistended, soft and nontender. No organomegaly or masses felt. Normal bowel sounds heard. Central nervous system: Mental status as noted above. No focal neurological deficits. Extremities: Spontaneously moves extremities at times Skin: No rashes, lesions or ulcers Psychiatry: Judgement and insight impaired. Mood & affect cannot be assessed.     Data Reviewed: I have personally reviewed following labs and imaging studies   CBC: Recent Labs  Lab 11/21/18 0208  11/22/18 2349 11/23/18 0332 11/24/18 0350 11/25/18 0049 11/26/18 0532  WBC 9.0  --   --  17.8* 20.2* 19.9* 27.4*  NEUTROABS  --   --   --   15.5*  --  18.1* 24.0*  HGB 11.6*   < > 13.3 13.4 13.1 12.4* 11.5*  HCT 36.1*   < > 39.0 42.1 41.6 39.0 37.1*  MCV 99.2  --   --  98.6 100.5* 100.0 102.5*  PLT 90*  --   --  97* 90* 76* 90*   < > = values in this interval not displayed.    Basic Metabolic Panel: Recent Labs  Lab 11/20/18 0423  11/22/18 0301  11/22/18 2349 11/23/18 0332 11/24/18 0350 11/25/18 0049 11/26/18 0532  NA 149*   < > 152*   < > 150* 153* 153* 152* 157*  K 4.8   < > 3.9   < > 4.2 4.5 4.8 5.0 5.0  CL 115*   < > 117*  --   --  111 113* 114* 116*  CO2 24   < > 27  --   --  32 31 30 29   GLUCOSE 156*   < > 191*  --   --  136* 145* 166* 152*  BUN 44*   < > 45*  --   --  48* 63* 66* 88*  CREATININE 0.82   < > 0.82  --   --  0.85 0.89 1.13 1.83*  CALCIUM 8.5*   < > 8.6*  --   --  8.9 8.8* 8.7* 9.1  MG 2.7*  --   --   --   --   --   --  2.6* 2.9*  PHOS 3.4  --   --   --   --   --   --  3.5 3.2   < > = values in this interval not displayed.    Liver Function Tests: Recent Labs  Lab 11/24/18 0350 11/26/18 0532  AST 60*  60* 169*  ALT 74*  73* 148*  ALKPHOS 85  95 100  BILITOT 2.5*  2.2* 2.2*  PROT 6.0*  6.0* 5.3*  ALBUMIN 2.5*  2.5* 2.4*    CBG: Recent Labs  Lab 11/25/18 2258 11/26/18 0344 11/26/18 0737 11/26/18 1228 11/26/18 1619  GLUCAP 131* 139* 140* 121* 164*    Recent Results (from the past 240 hour(s))  Urine culture     Status: None   Collection Time: 12/01/2018  5:45 PM   Specimen: In/Out Cath Urine  Result Value Ref Range Status   Specimen Description   Final    IN/OUT CATH URINE Performed at [facility], [address] [place], [place] [address]    Special Requests   Final    NONE Performed at [facility], [address] [place], [place] [address]    Culture   Final    NO GROWTH Performed at [facility], [place] [address], [place], [place] [address]    Report Status 11/18/2018 FINAL  Final  Culture, respiratory (non-expectorated)     Status: None   Collection  Time: 11/19/18  2:29 PM   Specimen: Tracheal Aspirate; Respiratory  Result Value Ref Range Status   Specimen Description TRACHEAL ASPIRATE  Final   Special Requests NONE  Final   Gram Stain   Final    MODERATE WBC PRESENT, PREDOMINANTLY PMN RARE GRAM POSITIVE COCCI    Culture   Final    NO GROWTH 2 DAYS Performed at [facility], [address] [place], [place] [address]    Report Status 11/22/2018 FINAL  Final  Culture, blood (routine x 2)     Status: None (Preliminary result)   Collection Time: 11/25/18 11:15 AM   Specimen: BLOOD RIGHT HAND  Result Value Ref Range Status   Specimen Description BLOOD RIGHT HAND  Final   Special Requests   Final    BOTTLES DRAWN AEROBIC ONLY Blood Culture adequate volume   Culture   Final    NO GROWTH < 24 HOURS Performed at [facility], [place] [address] [place], [place] [address]    Report Status PENDING  Incomplete  Culture, blood (routine x 2)     Status: None (Preliminary result)   Collection Time: 11/25/18 11:20 AM   Specimen: BLOOD RIGHT HAND  Result Value Ref Range Status   Specimen Description BLOOD RIGHT HAND  Final   Special Requests   Final    BOTTLES DRAWN AEROBIC ONLY Blood Culture results may not be optimal due to an inadequate volume of blood received in culture bottles   Culture   Final    NO GROWTH < 24 HOURS Performed at [facility], [place] [address], [place], [place] [address]    Report Status PENDING  Incomplete  Culture, Urine     Status: None   Collection Time: 11/25/18  1:59 PM   Specimen: Urine, Catheterized  Result Value Ref Range Status   Specimen Description URINE, CATHETERIZED  Final   Special Requests NONE  Final   Culture   Final    NO GROWTH Performed at [facility], [address]  [place], [place] [address]    Report Status 11/26/2018 FINAL  Final      Radiology Studies: US Abdomen Limited Ruq  Result Date: 11/25/2018 CLINICAL DATA:  73 year old male with jaundice.  History of cholecystectomy. EXAM: ULTRASOUND ABDOMEN LIMITED RIGHT UPPER QUADRANT COMPARISON:  Right upper quadrant ultrasound dated 11/17/2018. FINDINGS: Gallbladder: Cholecystectomy. Common bile duct: Diameter: 2 mm Liver: There is diffuse coarsened appearance of the liver echotexture with surface irregularity consistent with cirrhosis. Portal vein is patent on color Doppler imaging with normal direction of blood flow towards the liver. Other: None. IMPRESSION: 1. Cirrhosis. 2. Patent main portal vein with hepatopetal flow. 3. No ascites. 4. Cholecystectomy. Electronically Signed   By: Anner Crete M.D.   On: 11/25/2018 13:50          Scheduled Meds: . brimonidine  1 drop Both Eyes BID  . Chlorhexidine Gluconate Cloth  6 each Topical Daily  . dorzolamide  1 drop Left Eye BID  . feeding supplement (JEVITY 1.2 CAL)  1,000 mL Per Tube Q24H  . feeding supplement (PRO-STAT SUGAR FREE 64)  30 mL Per Tube TID  . free water  300 mL Per Tube Q6H  . insulin aspart  2-6 Units Subcutaneous Q4H  . latanoprost  1 drop Left Eye QHS  . methylPREDNISolone (SOLU-MEDROL) injection  60 mg Intravenous Q12H  . metoprolol tartrate  5 mg Intravenous Q6H  . polyethylene glycol  17 g Oral Daily   Continuous Infusions: . ceFEPime (MAXIPIME) IV 2 g (11/26/18 1249)  . dextrose 75 mL/hr at 11/26/18 1245  . diltiazem (CARDIZEM) infusion 15 mg/hr (11/26/18 0220)  . vancomycin       LOS: 10 days     Vernell Leep, MD, [place], [facility]. [facility]  To contact the attending provider between 7A-7P or the covering provider during after hours 7P-7A, please log into the web site [email or web address] and access using universal [place] password for that web site. If you do not have the password, please call the hospital operator.  11/26/2018, 5:18 PM

## 2018-11-26 NOTE — Progress Notes (Signed)
NAME:  Curtis [name], MRN:  [id number], DOB:  Aug 30, 1945, LOS: 36 ADMISSION DATE:  12/06/2018, CONSULTATION DATE:  11/12 REFERRING MD:  Wynetta Emery, CHIEF COMPLAINT:  Dyspnea   Brief History   73 y/o male with diffuse parenchymal lung disease of uncertain etiology admitted from [place] on 11/9 with acute hypoxemic respiratory failure presumably due to HCAP vs flare of underlying lung disease. Had recent pericarditis treated with colchicine prior to admission.  Past Medical History  Diffuse parenchymal lung disease, has been labeled IPF but duration of illness and pattern on CT not completely consistent with this Hypertension Hyperlipidemia Atrial fib Chest pain GERD arthritis  Significant Hospital Events   11/9 admission 11/12 intubated, transferred to [facility] 11/15 extubated 11/16 - Extubated yesterday. Started on BiPAP overnight for respiratory distress. Atrial fib after extubation. Started on scheduled metoprolol and diltiazem infusion.  11/17 - made no cpr but yes for intubation yesterday evening and by night fall made full code again by family. Currently on BiPAP. CT chest c/w ILD flare - personally visualized. LFT slightly worse. RN says he has been on bipap since yesterday afternoon . Still drowsy despite stopping klonopin - Na 153 despite d5w at 50cc/h. On cardizem gtt   Consults:  PCCM  Procedures:  ETT 11/12 > 11/15  Significant Diagnostic Tests:  RUQ [place] 11/10 > nodular appearing liver.  Cortical atrophy right kidney. CXR 11/12 > b/l opacities. CT chest wo contrast - bilat HC on UL but with superimposed diffused GGO  Micro Data:  Blood 11/9 >> negative Urine 11/9 >> negative 11/25/2018 urine cultures negative RVP 11/9 >> neg SARS CoV2 11/9 >> neg resp culture 11/12 > GPC 11/25/2018 blood cultures x2>>  Antimicrobials:  Vanc 11/9 > 11/10 Zosyn 11/9 x 1 Cefepime 11/9 >> 11/15 Azithro 11/10 >> 11/15  Vanc 11/18 >> Cefepime 11/18 >>  Interim history/subjective:   11/26/2018 currently in progressive care limited CODE BLUE poorly responsive    Objective   Blood pressure (!) 93/59, pulse 93, temperature (!) 101.8 F (38.8 C), temperature source Axillary, resp. rate (!) 40, height 6\' 1"  (1.854 m), weight 94.7 kg, SpO2 98 %.    FiO2 (%):  [40 %] 40 %   Intake/Output Summary (Last 24 hours) at 11/26/2018 1031 Last data filed at 11/26/2018 0220 Gross per 24 hour  Intake 704.67 ml  Output 225 ml  Net 479.67 ml   Filed Weights   11/20/18 0500 11/21/18 0332 11/25/18 0400  Weight: 100.2 kg 100.3 kg 94.7 kg    General: Ill-appearing male who is poorly responsive currently on noninvasive mechanical ventilatory support with a respiratory rate of 40 HEENT: Currently with full facemask BiPAP in place Neuro: Does not respond to verbal stimulus CV: Heart sounds are irregular PULM: Decreased air movement GI: soft, bsx4 active  Extremities: warm/dry, 2+ edema  Skin: no rashes or lesions          Resolved Hospital Problem list     Assessment & Plan:  Acute respiratory failure with hypoxemia in background of ILD (likely IPF baseed on clinical suspicion)     Plan Continue BiPAP nightly and as needed currently with a respiratory rate of 40 poorly responsive nearing end-stage in nature Try off BiPAP as tolerated   Sepsis - new since 11/17 or so  Plan Per primary Continue vancomycin and cefepime Monitor culture data   Periods of agitation/panic: wife says steroids make him agitated Benzo dependence at home  11/26/2018 - acute encephalopathy with drowsiness despite off klonopin  x 48h.  Likely now to residual effect of benzozo effect or High Na and possible sepsis since 11/17 (fever)  Plan Hold sedating medications Correct sodium is stable  Constipation MiraLAX Atrial fibrillation   Plan Questionable transition to heparin per primary Remains on Cardizem  Hypertension, hyperlipidemia .  Labetalol GERD: Pepcid   Hypernatremia: Recent Labs  Lab 11/24/18 0350 11/25/18 0049 11/26/18 0532  NA 153* 152* 157*     plan Free water From primary   Thrombocytopenia: slowly trending down, uncertain etiology  Plan Per primary  Transaminitis with mild jaundice  Plan Per primary  Best practice:  Diet: NPO. Place cortrak and start tube feeds Pain/Anxiety/Delirium protocol (if indicated): as above VAP protocol (if indicated): yes DVT prophylaxis: eliquis GI prophylaxis: famotidine Glucose control: SSI Mobility: bed rest Code Status: Limited code.  Noninvasive mechanical ventilatory support        LABS    PULMONARY Recent Labs  Lab 11/20/18 0346 11/20/18 0906 11/21/18 0408 11/22/18 0411 11/22/18 2349  PHART 7.280* 7.324* 7.375 7.397 7.474*  PCO2ART 57.2* 53.2* 48.2* 47.1 50.8*  PO2ART 89.0 172.0* 160.0* 101.0 94.0  HCO3 26.8 27.6 28.2* 29.1* 37.3*  TCO2 29 29 30 31  39*  O2SAT 95.0 99.0 99.0 98.0 98.0    CBC Recent Labs  Lab 11/24/18 0350 11/25/18 0049 11/26/18 0532  HGB 13.1 12.4* 11.5*  HCT 41.6 39.0 37.1*  WBC 20.2* 19.9* 27.4*  PLT 90* 76* 90*    COAGULATION Recent Labs  Lab 11/25/18 0049 11/26/18 0532  INR 1.9* 2.0*    CARDIAC  No results for input(s): TROPONINI in the last 168 hours. No results for input(s): PROBNP in the last 168 hours.   CHEMISTRY Recent Labs  Lab 11/20/18 0423  11/22/18 0301  11/22/18 2349 11/23/18 0332 11/24/18 0350 11/25/18 0049 11/26/18 0532  NA 149*   < > 152*   < > 150* 153* 153* 152* 157*  K 4.8   < > 3.9   < > 4.2 4.5 4.8 5.0 5.0  CL 115*   < > 117*  --   --  111 113* 114* 116*  CO2 24   < > 27  --   --  32 31 30 29   GLUCOSE 156*   < > 191*  --   --  136* 145* 166* 152*  BUN 44*   < > 45*  --   --  48* 63* 66* 88*  CREATININE 0.82   < > 0.82  --   --  0.85 0.89 1.13 1.83*  CALCIUM 8.5*   < > 8.6*  --   --  8.9 8.8* 8.7* 9.1  MG 2.7*  --   --   --   --   --   --  2.6* 2.9*  PHOS 3.4  --   --   --   --   --    --  3.5 3.2   < > = values in this interval not displayed.   Estimated Creatinine Clearance: 39.4 mL/min (A) (by C-G formula based on SCr of 1.83 mg/dL (H)).   LIVER Recent Labs  Lab 11/24/18 0350 11/25/18 0049 11/26/18 0532  AST 60*  60*  --  169*  ALT 74*  73*  --  148*  ALKPHOS 85  95  --  100  BILITOT 2.5*  2.2*  --  2.2*  PROT 6.0*  6.0*  --  5.3*  ALBUMIN 2.5*  2.5*  --  2.4*  INR  --  1.9* 2.0*     INFECTIOUS Recent Labs  Lab 11/25/18 1115 11/25/18 1125 11/26/18 0532  LATICACIDVEN 2.0*  --   --   PROCALCITON  --  0.12 0.32     ENDOCRINE CBG (last 3)  Recent Labs    11/25/18 2258 11/26/18 0344 11/26/18 0737  GLUCAP 131* 139* 140*         IMAGING x48h  - image(s) personally visualized  -   highlighted in bold US Abdomen Limited Ruq  Result Date: 11/25/2018 CLINICAL DATA:  73 year old male with jaundice. History of cholecystectomy. EXAM: ULTRASOUND ABDOMEN LIMITED RIGHT UPPER QUADRANT COMPARISON:  Right upper quadrant ultrasound dated 11/17/2018. FINDINGS: Gallbladder: Cholecystectomy. Common bile duct: Diameter: 2 mm Liver: There is diffuse coarsened appearance of the liver echotexture with surface irregularity consistent with cirrhosis. Portal vein is patent on color Doppler imaging with normal direction of blood flow towards the liver. Other: None. IMPRESSION: 1. Cirrhosis. 2. Patent main portal vein with hepatopetal flow. 3. No ascites. 4. Cholecystectomy. Electronically Signed   By: Anner Crete M.D.   On: 11/25/2018 13:50   Richardson Landry Merrin Mcvicker ACNP Maryanna Shape PCCM

## 2018-11-26 NOTE — Progress Notes (Signed)
RT attempted to place BIPAP on patient. Patient became more agitated with increase WOB with mask on. Wife is at beside and agreed to take mask off and just let him rest on 6L [place]. Patient tolerating well at this time.

## 2018-11-26 NOTE — Progress Notes (Signed)
Talked with NP Blount, patient is being weaned off of Cardizem drip, patient is currently at 73mL/hr.  MD orders were to be weaned 8mL/hr every hour. MD Blount stated to continue to wean patient from the Cardizem drip, the drip will be discontinued for now unless the patient begins to go into Afib with RVR. Will continue to monitor.

## 2018-11-26 NOTE — Progress Notes (Signed)
   Checked on patient, patient is breathing rapid short breaths around 40 breaths a minute.  Patient is on Bipap, lung sounds decreased and showing sinus tach with PVCs. This is not an acute change. Will continue to monitor.   Vital Signs MEWS/VS Documentation      11/25/2018 2314 11/25/2018 2319 11/26/2018 0300 11/26/2018 0311   MEWS Score:  4  3  5  4    MEWS Score Color:  Red  Yellow  Red  Red   Resp:  (!) 32  (!) 34  (!) 43  (!) 41   Pulse:  (!) 103  99  (!) 101  (!) 104   BP:  -  123/77  -  108/76   Temp:  -  98.4 F (36.9 C)  -  98 F (36.7 C)   O2 Device:  Bi-PAP  -  Bi-PAP  -   FiO2 (%):  40 %  -  40 %  -           Tila Millirons G Mercedees Convery 11/26/2018,3:27 AM

## 2018-11-26 NOTE — Progress Notes (Signed)
Pharmacy Antibiotic Note  Curtis [name] is a 73 y.o. male admitted on 11/24/2018 with sepsis.  Pharmacy has been consulted for cefepime and vancomycin dosing.  Pt with history of idiopathic pulmonary fibrosis but per CCM duration of illness and pattern on CT are not completely consistent with this. New onset WBC elevation, pt febrile, suspicion for sepsis secondary to PNA.   Patient with worsening renal fx, SCr 0.8 > 1.1 > 1.8.  Plan: Will empirically reduce vancomycin to 1250 mg IV q24h given worsening renal fx Low threshold to stop vancomycin  Continue cefepime 2g IV q12h  Goal AUC 400-550. Expected AUC: 486 SCr used: 1.8 Will f/u renal function, micro data, and pt's clinical condition   Height: 6\' 1"  (185.4 cm) Weight: 208 lb 12.4 oz (94.7 kg) IBW/kg (Calculated) : 79.9  Temp (24hrs), Avg:100 F (37.8 C), Min:98 F (36.7 C), Max:101.8 F (38.8 C)  Recent Labs  Lab 11/21/18 0208 11/22/18 0301 11/23/18 0332 11/24/18 0350 11/25/18 0049 11/25/18 1115 11/26/18 0532  WBC 9.0  --  17.8* 20.2* 19.9*  --  27.4*  CREATININE 0.86 0.82 0.85 0.89 1.13  --  1.83*  LATICACIDVEN  --   --   --   --   --  2.0*  --     Estimated Creatinine Clearance: 39.4 mL/min (A) (by C-G formula based on SCr of 1.83 mg/dL (H)).    Allergies  Allergen Reactions  . Pirfenidone Other (See Comments)    Elevated bilirubin  . Acyclovir And Related Other (See Comments)    Unknown  . Oxycodone Itching  . Pravastatin Other (See Comments)    Chills    . Simvastatin Other (See Comments)    Chills and Leg pain  . Prednisone Anxiety and Other (See Comments)    Irritation, Can tolerate in small doses   . Tegretol [Carbamazepine] Other (See Comments)    dizziness     Curtis [name] 11/26/2018 11:48 AM

## 2018-11-26 NOTE — Plan of Care (Signed)
  Problem: Education: Goal: Knowledge of General Education information will improve Description: Including pain rating scale, medication(s)/side effects and non-pharmacologic comfort measures 11/26/2018 2231 by Max Fickle, RN Outcome: Progressing 11/26/2018 2231 by Max Fickle, RN Outcome: Not Progressing   Problem: Health Behavior/Discharge Planning: Goal: Ability to manage health-related needs will improve 11/26/2018 2231 by Max Fickle, RN Outcome: Progressing 11/26/2018 2231 by Max Fickle, RN Outcome: Not Progressing   Problem: Clinical Measurements: Goal: Ability to maintain clinical measurements within normal limits will improve 11/26/2018 2231 by Max Fickle, RN Outcome: Progressing 11/26/2018 2231 by Max Fickle, RN Outcome: Not Progressing Goal: Will remain free from infection 11/26/2018 2231 by Max Fickle, RN Outcome: Progressing 11/26/2018 2231 by Max Fickle, RN Outcome: Not Progressing Goal: Diagnostic test results will improve 11/26/2018 2231 by Max Fickle, RN Outcome: Progressing 11/26/2018 2231 by Max Fickle, RN Outcome: Not Progressing Goal: Respiratory complications will improve 11/26/2018 2231 by Max Fickle, RN Outcome: Progressing 11/26/2018 2231 by Max Fickle, RN Outcome: Not Progressing Goal: Cardiovascular complication will be avoided 11/26/2018 2231 by Max Fickle, RN Outcome: Progressing 11/26/2018 2231 by Max Fickle, RN Outcome: Not Progressing   Problem: Activity: Goal: Risk for activity intolerance will decrease 11/26/2018 2231 by Max Fickle, RN Outcome: Progressing 11/26/2018 2231 by Max Fickle, RN Outcome: Not Progressing   Problem: Nutrition: Goal: Adequate nutrition will be maintained 11/26/2018 2231 by Max Fickle, RN Outcome: Progressing 11/26/2018 2231 by Max Fickle, RN Outcome: Not Progressing   Problem: Coping: Goal: Level of anxiety will  decrease 11/26/2018 2231 by Max Fickle, RN Outcome: Progressing 11/26/2018 2231 by Max Fickle, RN Outcome: Not Progressing   Problem: Elimination: Goal: Will not experience complications related to bowel motility 11/26/2018 2231 by Max Fickle, RN Outcome: Progressing 11/26/2018 2231 by Max Fickle, RN Outcome: Not Progressing Goal: Will not experience complications related to urinary retention 11/26/2018 2231 by Max Fickle, RN Outcome: Progressing 11/26/2018 2231 by Max Fickle, RN Outcome: Not Progressing   Problem: Pain Managment: Goal: General experience of comfort will improve 11/26/2018 2231 by Max Fickle, RN Outcome: Progressing 11/26/2018 2231 by Max Fickle, RN Outcome: Not Progressing   Problem: Safety: Goal: Ability to remain free from injury will improve 11/26/2018 2231 by Max Fickle, RN Outcome: Progressing 11/26/2018 2231 by Max Fickle, RN Outcome: Not Progressing   Problem: Skin Integrity: Goal: Risk for impaired skin integrity will decrease 11/26/2018 2231 by Max Fickle, RN Outcome: Progressing 11/26/2018 2231 by Max Fickle, RN Outcome: Not Progressing

## 2018-11-27 LAB — BASIC METABOLIC PANEL
Anion gap: 11 (ref 5–15)
BUN: 124 mg/dL — ABNORMAL HIGH (ref 8–23)
CO2: 25 mmol/L (ref 22–32)
Calcium: 8.6 mg/dL — ABNORMAL LOW (ref 8.9–10.3)
Chloride: 119 mmol/L — ABNORMAL HIGH (ref 98–111)
Creatinine, Ser: 2.46 mg/dL — ABNORMAL HIGH (ref 0.61–1.24)
GFR calc Af Amer: 29 mL/min — ABNORMAL LOW (ref >60)
GFR calc non Af Amer: 25 mL/min — ABNORMAL LOW (ref >60)
Glucose, Bld: 188 mg/dL — ABNORMAL HIGH (ref 70–99)
Potassium: 5.3 mmol/L — ABNORMAL HIGH (ref 3.5–5.1)
Sodium: 155 mmol/L — ABNORMAL HIGH (ref 135–145)

## 2018-11-27 LAB — CBC WITH DIFFERENTIAL/PLATELET
Abs Immature Granulocytes: 0.8 10*3/uL — ABNORMAL HIGH (ref 0.00–0.07)
Basophils Absolute: 0.1 10*3/uL (ref 0.0–0.1)
Basophils Relative: 0 %
Eosinophils Absolute: 0 10*3/uL (ref 0.0–0.5)
Eosinophils Relative: 0 %
HCT: 34.4 % — ABNORMAL LOW (ref 39.0–52.0)
Hemoglobin: 10.5 g/dL — ABNORMAL LOW (ref 13.0–17.0)
Immature Granulocytes: 3 %
Lymphocytes Relative: 5 %
Lymphs Abs: 1.4 10*3/uL (ref 0.7–4.0)
MCH: 31.6 pg (ref 26.0–34.0)
MCHC: 30.5 g/dL (ref 30.0–36.0)
MCV: 103.6 fL — ABNORMAL HIGH (ref 80.0–100.0)
Monocytes Absolute: 1.1 10*3/uL — ABNORMAL HIGH (ref 0.1–1.0)
Monocytes Relative: 4 %
Neutro Abs: 23.1 10*3/uL — ABNORMAL HIGH (ref 1.7–7.7)
Neutrophils Relative %: 88 %
Platelets: 72 10*3/uL — ABNORMAL LOW (ref 150–400)
RBC: 3.32 MIL/uL — ABNORMAL LOW (ref 4.22–5.81)
RDW: 13.7 % (ref 11.5–15.5)
WBC: 26.4 10*3/uL — ABNORMAL HIGH (ref 4.0–10.5)
nRBC: 1.2 % — ABNORMAL HIGH (ref 0.0–0.2)

## 2018-11-27 LAB — GLUCOSE, CAPILLARY
Glucose-Capillary: 183 mg/dL — ABNORMAL HIGH (ref 70–99)
Glucose-Capillary: 159 mg/dL — ABNORMAL HIGH (ref 70–99)
Glucose-Capillary: 176 mg/dL — ABNORMAL HIGH (ref 70–99)

## 2018-11-27 LAB — MAGNESIUM: Magnesium: 2.9 mg/dL — ABNORMAL HIGH (ref 1.7–2.4)

## 2018-11-27 LAB — PROCALCITONIN: Procalcitonin: 0.54 ng/mL

## 2018-11-27 LAB — PHOSPHORUS: Phosphorus: 4.8 mg/dL — ABNORMAL HIGH (ref 2.5–4.6)

## 2018-11-27 MED ORDER — LORAZEPAM 2 MG/ML PO CONC: 1.0000 mg | ORAL | Status: DC | PRN

## 2018-11-27 MED ORDER — DIPHENHYDRAMINE HCL 50 MG/ML IJ SOLN: 12.5000 mg | INTRAMUSCULAR | Status: DC | PRN

## 2018-11-27 MED ORDER — SODIUM CHLORIDE 0.9 % IV SOLN: 250.0000 mL | INTRAVENOUS | Status: DC | PRN

## 2018-11-27 MED ORDER — LORAZEPAM 2 MG/ML IJ SOLN: 1.0000 mg | INTRAMUSCULAR | Status: DC | PRN

## 2018-11-27 MED ORDER — GLYCOPYRROLATE 0.2 MG/ML IJ SOLN: 0.2000 mg | INTRAMUSCULAR | Status: DC | PRN

## 2018-11-27 MED ORDER — SODIUM CHLORIDE 0.9% FLUSH: 3.0000 mL | Freq: Two times a day (BID) | INTRAVENOUS | Status: DC

## 2018-11-27 MED ORDER — LORAZEPAM 1 MG PO TABS: 1.0000 mg | ORAL_TABLET | ORAL | Status: DC | PRN

## 2018-11-27 MED ORDER — GLYCOPYRROLATE 0.2 MG/ML IJ SOLN
0.2000 mg | INTRAMUSCULAR | Status: DC
  Administered 2018-11-27 (×2): 0.2 mg via INTRAVENOUS
  Filled 2018-11-27 (×2): qty 1

## 2018-11-27 MED ORDER — SODIUM ZIRCONIUM CYCLOSILICATE 10 G PO PACK
10.0000 g | PACK | Freq: Two times a day (BID) | ORAL | Status: DC
  Administered 2018-11-27: 10 g via ORAL
  Filled 2018-11-27: qty 1

## 2018-11-27 MED ORDER — GLYCOPYRROLATE 1 MG PO TABS: 1.0000 mg | ORAL_TABLET | ORAL | Status: DC | PRN

## 2018-11-27 MED ORDER — MORPHINE SULFATE (CONCENTRATE) 10 MG/0.5ML PO SOLN: 5.0000 mg | ORAL | Status: DC | PRN

## 2018-11-27 MED ORDER — SODIUM CHLORIDE 0.9% FLUSH: 3.0000 mL | INTRAVENOUS | Status: DC | PRN

## 2018-11-27 MED ORDER — MORPHINE SULFATE (CONCENTRATE) 10 MG/0.5ML PO SOLN
5.0000 mg | ORAL | Status: DC | PRN
  Filled 2018-11-27: qty 0.5

## 2018-11-30 LAB — CULTURE, BLOOD (ROUTINE X 2)
Culture: NO GROWTH
Culture: NO GROWTH
Special Requests: ADEQUATE

## 2018-12-08 NOTE — Progress Notes (Signed)
Comfort measures only Discussed with Dr. Algis Liming at this morning  PCCM will sign off

## 2018-12-08 NOTE — Progress Notes (Signed)
Patient converted back to Afib, paged NP Blount and started Cardizem gtt back at 18mL/hr. Patient's Hr is sustaining around 140s, RR sustaining in the 83s. O2 is sustaining between 88-90. Respiratory has been called. NP Blount has been paged once again for patients further deteriorating status.

## 2018-12-08 NOTE — Death Summary Note (Signed)
DEATH SUMMARY   Patient Details  Name: Dezion Jeanlouis MRN: [id number] DOB: 07-04-1945  Admission/Discharge Information   Admit Date:  12-06-18  Date of Death: Date of Death: 12/17/18  Time of Death: Time of Death: 04-22-39  Length of Stay: Apr 07, 2022  Referring Physician: Kathyrn Drown, MD   Reason(s) for Hospitalization    Diagnoses  Preliminary cause of death: Acute respiratory failure with hypoxia Secondary Diagnoses (including complications and co-morbidities):  Active Problems:   IPF (idiopathic pulmonary fibrosis) (HCC)   Persistent atrial fibrillation   Atrial fibrillation, persistent   Acute respiratory failure with hypoxia (HCC)   Sepsis due to undetermined organism (HCC)   Lobar pneumonia (HCC)   Pressure injury of skin   Endotracheal tube present   Malnutrition of moderate degree   Acute on chronic respiratory failure with hypoxia [facility])   Brief Hospital Course (including significant findings, care, treatment, and services provided and events leading to death)   PCCM transfer to [facility] 3/77: 73 year old married male with PMH of diffuse parenchymal lung disease suspected to be ILD, HTN, HLD, A. fib, GERD, recent pericarditis treated with colchicine, admitted from [facility] on December 06, 2022 with acute hypoxic respiratory failure presumably due to community-acquired pneumonia versus ILD flare.  Intubated/11/12, extubated 11/15, PCCM discussed extensively with family regarding [place], transitioned to partial code/BiPAP only and transferred to progressive unit.  Worsening hypernatremia/acute kidney injury, initiated IV D5 infusion.  Overall poor prognosis.  Discussed extensively with patient spouse on 11/19 and advised her of patient's critical illness, multiorgan failure but decided to continue treatment overnight with hope of improvement in his overall condition.  When I assessed patient this morning with his spouse at bedside, patient was much worse.  Unresponsive.  Shallow  rapid breathing.  Worsening acute kidney injury with hyperkalemia.  I reiterated patient's overall poor prognosis and multiorgan failure.  At this time spouse spoke with her sons and transitioned him to full comfort care.  Patient demised a short while after BiPAP was discontinued.  Following was the course of events during his last hospitalization.   Assessment & Plan:   Acute respiratory failure with hypoxia  Suspected due to healthcare associated pneumonia versus ILD flare.  Intubated on 11/12, extubated 11/15.  Acute metabolic encephalopathy  Likely multifactorial due to acute kidney injury, hypernatremia and hypoxia.  All sedatives/opiates have been discontinued.  Progressively worsened.  Acute kidney injury   creatinine has gone up from 0.82 days ago to 1.83.  Serum sodium has gone up from 1 52-1 57  Progressively worsening despite IV D5 infusion.  Hypernatremic dehydration  Suspected secondary to poor oral intake.  Treated with IV D5 infusion  Sepsis due to Healthcare associated pneumonia/possible aspiration pneumonia  Cultures thus far including urine culture, tracheal aspirate culture and blood cultures, negative to date.  Treated with cefepime and vancomycin.  Remained high aspiration risk.  ILD flare  Per CCM, treated with IV Solu-Medrol 60 mg every 12 hourly.  Agitation/panic/benzodiazepine dependent at home  Benzodiazepines held due to mental status changes.  A. fib, paroxysmal Cardizem was weaned off but went back into A. fib with RVR.  Cardizem drip resumed but issues with hypotension this morning.  Essential hypertension  Hypotension.  Hyperlipidemia  GERD  Thrombocytopenia  Unclear etiology.?  Acute illness/sepsis.  Eliquis was held.  Mild transaminitis/cirrhosis  Unclear etiology.  RUQ ultrasound shows cirrhosis, no ascites, s/p cholecystectomy  Body mass index is 27.54 kg/m.  Nutritional Status Nutrition  Problem: Moderate Malnutrition Etiology: chronic illness(idiopathic  pulmonary fibrosis) Signs/Symptoms: moderate fat depletion, moderate muscle depletion, mild muscle depletion Interventions: Tube feeding   Pertinent Labs and Studies  Significant Diagnostic Studies Dg Chest 2 View  Result Date: 11/15/2018 CLINICAL DATA:  Patient with chest pain. EXAM: CHEST - 2 VIEW COMPARISON:  Chest radiograph 08/16/2018 FINDINGS: Monitoring leads overlie the patient. Stable cardiac and mediastinal contours. Similar-appearing diffuse bilateral interstitial pulmonary opacities. No definite superimposed pulmonary consolidation. No pleural effusion or pneumothorax. IMPRESSION: Similar-appearing diffuse bilateral interstitial pulmonary opacities compatible with interstitial lung disease. Electronically Signed   By: Lovey Newcomer M.D.   On: 11/15/2018 14:53   Ct Chest Wo Contrast  Result Date: 11/24/2018 CLINICAL DATA:  Follow-up interstitial lung disease. Dyspnea and chest pain. Inpatient. EXAM: CT CHEST WITHOUT CONTRAST TECHNIQUE: Multidetector CT imaging of the chest was performed following the standard protocol without IV contrast. COMPARISON:  09/11/2016 high-resolution chest CT. FINDINGS: Cardiovascular: Mild cardiomegaly. No significant pericardial effusion/thickening. Three-vessel coronary atherosclerosis. Atherosclerotic nonaneurysmal thoracic aorta. Top-normal caliber main pulmonary artery (3.2 cm diameter). Mediastinum/Nodes: No discrete thyroid nodules. Fluid level in the thoracic esophagus. No axillary adenopathy. Stable mildly enlarged 1.2 cm low right paratracheal node (series 3/image 57). No new pathologically enlarged mediastinal nodes. No discrete hilar adenopathy on these noncontrast images. Lungs/Pleura: No pneumothorax. No pleural effusion. There is new extensive patchy ground-glass opacity throughout both lungs superimposed on chronic patchy confluent subpleural reticulation, ground-glass attenuation,  traction bronchiectasis and moderate honeycombing. No clear apicobasilar gradient to the underlying fibrotic findings, noting that the honeycombing is most prominent in upper lobes. The underlying honeycombing and bronchiectasis has not appreciably progressed in the interval. No lung masses or significant pulmonary nodules. Upper abdomen: Cholecystectomy. Musculoskeletal:  No aggressive appearing focal osseous lesions. IMPRESSION: 1. New extensive patchy ground-glass opacity throughout both lungs superimposed on chronic fibrotic interstitial lung disease with honeycombing, previously favored to represent UIP. Differential for the new ground-glass opacity is broad and includes pulmonary edema, IPF flare, atypical infection, drug toxicity and other causes. 2. Mild cardiomegaly.  Three-vessel coronary atherosclerosis. 3. Chronic mild mediastinal lymphadenopathy is unchanged and most compatible with benign reactive etiology. Aortic Atherosclerosis (ICD10-I70.0). Electronically Signed   By: Ilona Sorrel M.D.   On: 11/24/2018 07:56   Am Dg Chest Port 1 View  Result Date: 11/24/2018 CLINICAL DATA:  Hypoxia EXAM: PORTABLE CHEST 1 VIEW COMPARISON:  November 21, 2018 FINDINGS: Endotracheal tube and nasogastric tube have been removed. No evident pneumothorax. There remains interstitial fibrosis throughout the lungs bilaterally without significant change. There is no frank edema or consolidation. Heart is mildly enlarged with pulmonary vascularity normal. No adenopathy. There is aortic atherosclerosis. No bone lesions. IMPRESSION: No pneumothorax. Coarse interstitial opacities consistent with fibrosis remain. No new opacity evident. No frank airspace consolidation. Stable cardiac prominence. Aortic Atherosclerosis (ICD10-I70.0). Electronically Signed   By: Lowella Grip III M.D.   On: 11/24/2018 06:59   Dg Chest Port 1 View  Result Date: 11/21/2018 CLINICAL DATA:  Acute respiratory failure with hypoxia. EXAM:  PORTABLE CHEST 1 VIEW COMPARISON:  11/20/2018 FINDINGS: The endotracheal tube tip is above the carina. There is a nasogastric tube in place with tip below the field of view. Stable cardiac enlargement. Diffuse reticular interstitial opacities of chronic interstitial lung disease is again noted throughout both lungs. Superimposed bilateral airspace opacities are unchanged compared with previous exam. IMPRESSION: 1. No change in aeration to the lungs compared with previous exam. 2. Stable support apparatus. 3. Stable appearance of chronic interstitial lung disease. Electronically Signed   By: Lovena Le  Clovis Riley M.D.   On: 11/21/2018 05:35   Portable Chest Xray  Result Date: 11/20/2018 CLINICAL DATA:  Intubation. EXAM: PORTABLE CHEST 1 VIEW COMPARISON:  11/19/2018.  12/03/2018. FINDINGS: Endotracheal tube and NG tube in stable position. Cardiomegaly. Diffuse severe bilateral pulmonary interstitial prominence again noted. Bilateral alveolar infiltrates have improved slightly from prior exam. Small left pleural effusion cannot be excluded. No pneumothorax. IMPRESSION: 1.  Endotracheal tube and NG tube in stable position. 2.  Stable cardiomegaly. 3. Diffuse severe bilateral pulmonary interstitial prominence again noted. Bilateral alveolar infiltrates have improved slightly from prior exam. Small left pleural effusion cannot be excluded. Electronically Signed   By: Marcello Moores  Register   On: 11/20/2018 06:33   Dg Chest Port 1 View  Result Date: 11/19/2018 CLINICAL DATA:  Status post intubation. EXAM: PORTABLE CHEST 1 VIEW COMPARISON:  November 16, 2018. FINDINGS: Stable cardiomediastinal silhouette. Interval placement of endotracheal and nasogastric tubes in grossly good position. Stable bilateral lung opacities are noted concerning for pneumonia or edema. No pneumothorax or pleural effusion is noted. Bony thorax is unremarkable. IMPRESSION: Endotracheal and nasogastric tubes are in grossly good position. Stable bilateral  lung opacities. Electronically Signed   By: Marijo Conception M.D.   On: 11/19/2018 07:42   Dg Chest Portable 1 View  Result Date: 11/25/2018 CLINICAL DATA:  Worsening shortness of breath and weakness. History of pulmonary fibrosis. EXAM: PORTABLE CHEST 1 VIEW COMPARISON:  11/15/2018 FINDINGS: The cardiomediastinal silhouette is unchanged. There is a background of chronic interstitial lung disease. New/increased patchy opacities are present in the right greater than left lung bases and possibly left midlung. No pleural effusion or pneumothorax is identified. No acute osseous abnormality is seen. IMPRESSION: Chronic interstitial lung disease with new/increased patchy opacities in the right greater than left lung bases which may reflect superimposed pneumonia or edema. Electronically Signed   By: Logan Bores M.D.   On: 11/15/2018 09:39   US Abdomen Limited Ruq  Result Date: 11/25/2018 CLINICAL DATA:  73 year old male with jaundice. History of cholecystectomy. EXAM: ULTRASOUND ABDOMEN LIMITED RIGHT UPPER QUADRANT COMPARISON:  Right upper quadrant ultrasound dated 11/17/2018. FINDINGS: Gallbladder: Cholecystectomy. Common bile duct: Diameter: 2 mm Liver: There is diffuse coarsened appearance of the liver echotexture with surface irregularity consistent with cirrhosis. Portal vein is patent on color Doppler imaging with normal direction of blood flow towards the liver. Other: None. IMPRESSION: 1. Cirrhosis. 2. Patent main portal vein with hepatopetal flow. 3. No ascites. 4. Cholecystectomy. Electronically Signed   By: Anner Crete M.D.   On: 11/25/2018 13:50   US Abdomen Limited Ruq  Result Date: 11/17/2018 CLINICAL DATA:  Hyperbilirubinemia EXAM: ULTRASOUND ABDOMEN LIMITED RIGHT UPPER QUADRANT COMPARISON:  CT abdomen and pelvis 05/27/2016 FINDINGS: Gallbladder: Surgically absent, 2014 Common bile duct: Diameter: 3 mm, normal Liver: Heterogeneous hepatic echogenicity. Suboptimal assessment of  intrahepatic detail due to sound attenuation. Liver appears slightly nodular in appearance on the prior CT exam, suggesting cirrhosis. Portal vein is patent on color Doppler imaging with normal direction of blood flow towards the liver. Other: No RIGHT upper quadrant free fluid. Marked cortical atrophy of the RIGHT kidney is noted. IMPRESSION: Post cholecystectomy. Heterogeneous hepatic echogenicity, with liver appearing nodular question cirrhotic on the prior CT exam from 2018. No definite hepatic mass or biliary dilatation identified. Cortical atrophy RIGHT kidney. Electronically Signed   By: Lavonia Dana M.D.   On: 11/17/2018 17:30    Microbiology Recent Results (from the past 240 hour(s))  Culture, respiratory (non-expectorated)  Status: None   Collection Time: 11/19/18  2:29 PM   Specimen: Tracheal Aspirate; Respiratory  Result Value Ref Range Status   Specimen Description TRACHEAL ASPIRATE  Final   Special Requests NONE  Final   Gram Stain   Final    MODERATE WBC PRESENT, PREDOMINANTLY PMN RARE GRAM POSITIVE COCCI    Culture   Final    NO GROWTH 2 DAYS Performed at [facility], [address] [place], [place] [address]    Report Status 11/22/2018 FINAL  Final  Culture, blood (routine x 2)     Status: None (Preliminary result)   Collection Time: 11/25/18 11:15 AM   Specimen: BLOOD RIGHT HAND  Result Value Ref Range Status   Specimen Description BLOOD RIGHT HAND  Final   Special Requests   Final    BOTTLES DRAWN AEROBIC ONLY Blood Culture adequate volume   Culture   Final    NO GROWTH 2 DAYS Performed at [facility], [place] [address], [place], [place] [address]    Report Status PENDING  Incomplete  Culture, blood (routine x 2)     Status: None (Preliminary result)   Collection Time: 11/25/18 11:20 AM   Specimen: BLOOD RIGHT HAND  Result Value Ref Range Status   Specimen Description BLOOD RIGHT HAND  Final   Special Requests   Final    BOTTLES DRAWN AEROBIC  ONLY Blood Culture results may not be optimal due to an inadequate volume of blood received in culture bottles   Culture   Final    NO GROWTH 2 DAYS Performed at [facility], [place] [address], [place], [place] [address]    Report Status PENDING  Incomplete  Culture, Urine     Status: None   Collection Time: 11/25/18  1:59 PM   Specimen: Urine, Catheterized  Result Value Ref Range Status   Specimen Description URINE, CATHETERIZED  Final   Special Requests NONE  Final   Culture   Final    NO GROWTH Performed at [facility], [address] [place],  [address]    Report Status 11/26/2018 FINAL  Final    Lab Basic Metabolic Panel: Recent Labs  Lab 11/23/18 0332 11/24/18 0350 11/25/18 0049 11/26/18 0532 27-Dec-2018 0352  NA 153* 153* 152* 157* 155*  K 4.5 4.8 5.0 5.0 5.3*  CL 111 113* 114* 116* 119*  CO2 32 31 30 29 25   GLUCOSE 136* 145* 166* 152* 188*  BUN 48* 63* 66* 88* 124*  CREATININE 0.85 0.89 1.13 1.83* 2.46*  CALCIUM 8.9 8.8* 8.7* 9.1 8.6*  MG  --   --  2.6* 2.9* 2.9*  PHOS  --   --  3.5 3.2 4.8*   Liver Function Tests: Recent Labs  Lab 11/24/18 0350 11/26/18 0532  AST 60*  60* 169*  ALT 74*  73* 148*  ALKPHOS 85  95 100  BILITOT 2.5*  2.2* 2.2*  PROT 6.0*  6.0* 5.3*  ALBUMIN 2.5*  2.5* 2.4*   No results for input(s): LIPASE, AMYLASE in the last 168 hours. No results for input(s): AMMONIA in the last 168 hours. CBC: Recent Labs  Lab 11/23/18 0332 11/24/18 0350 11/25/18 0049 11/26/18 0532 December 27, 2018 0352  WBC 17.8* 20.2* 19.9* 27.4* 26.4*  NEUTROABS 15.5*  --  18.1* 24.0* 23.1*  HGB 13.4 13.1 12.4* 11.5* 10.5*  HCT 42.1 41.6 39.0 37.1* 34.4*  MCV 98.6 100.5* 100.0 102.5* 103.6*  PLT 97* 90* 76* 90*  72*   Cardiac Enzymes: No results for input(s): CKTOTAL, CKMB, CKMBINDEX, TROPONINI in the last 168 hours. Sepsis Labs: Recent Labs  Lab 11/24/18 0350 11/25/18 0049 11/25/18 1115 11/25/18 1125 11/26/18 0532 12/12/2018 0352   PROCALCITON  --   --   --  0.12 0.32 0.54  WBC 20.2* 19.9*  --   --  27.4* 26.4*  LATICACIDVEN  --   --  2.0*  --   --   --     Procedures/Operations     [employer] 12-12-18, 10:57 AM

## 2018-12-08 NOTE — Care Management (Addendum)
CM had lengthy discussion with pts wife - she is at bedside and pt is not able to communicate.  Per CM consult CM discussed home with hospice option and answered question regarding residential hospice.  CM had detailed conversation with wife and wife informed CM that she  would like to utilize [facility] if pt continues to be appropriate for home discharge or needs residential. Wife declined for CM to make a referral or to request liaison from [facility] to contact her and answer additionals questions.   Wife pans to [facility] and research both options of home with hospice and will follow up with CM.

## 2018-12-08 NOTE — Progress Notes (Signed)
Curtis [name] paged re: pt deteriorating. Pt not responsive to voice; does not speak, but will open eyes with physical stimulation. Open mouthed, shallow, rattling breathing. RR approximately 40. Discussed via phone pt status, robinul added. Pt's BUE have become edematous, growing in size over the course of this shift. Pt will intermittently move arms jerkily, lifting them suddenly, but difficult to do - will fall down, cannot fight gravity. Is not moving BLE. Wife at the bedside, visibly concerned & distressed.

## 2018-12-08 DEATH — deceased

## 2019-01-22 ENCOUNTER — Ambulatory Visit: Payer: PPO | Admitting: Cardiology

## 2021-01-22 IMAGING — CR CHEST - 2 VIEW
2 series · 2 of 2 positions shown · non-contrast
Comparison: Chest radiograph dated 08/13/2018

CLINICAL DATA: 72-year-old male with chest pain and shortness of
breath.

EXAM:
CHEST - 2 VIEW

[chest lat]
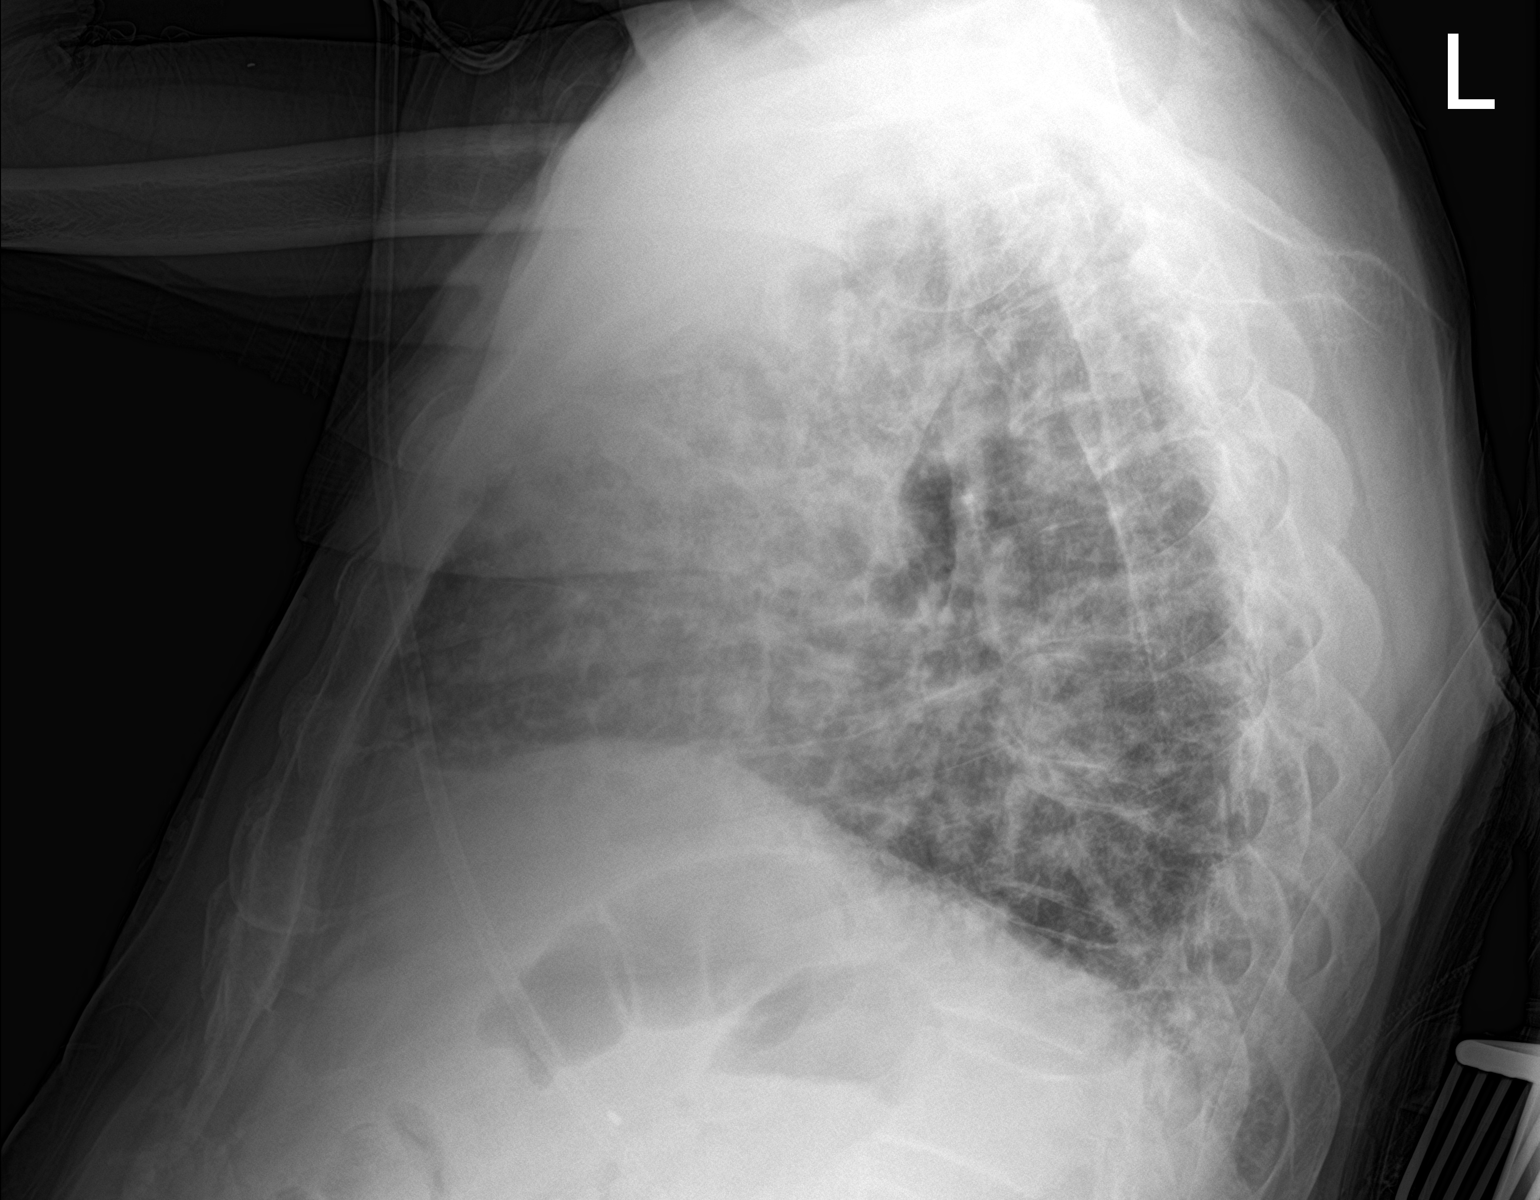

[chest ap]
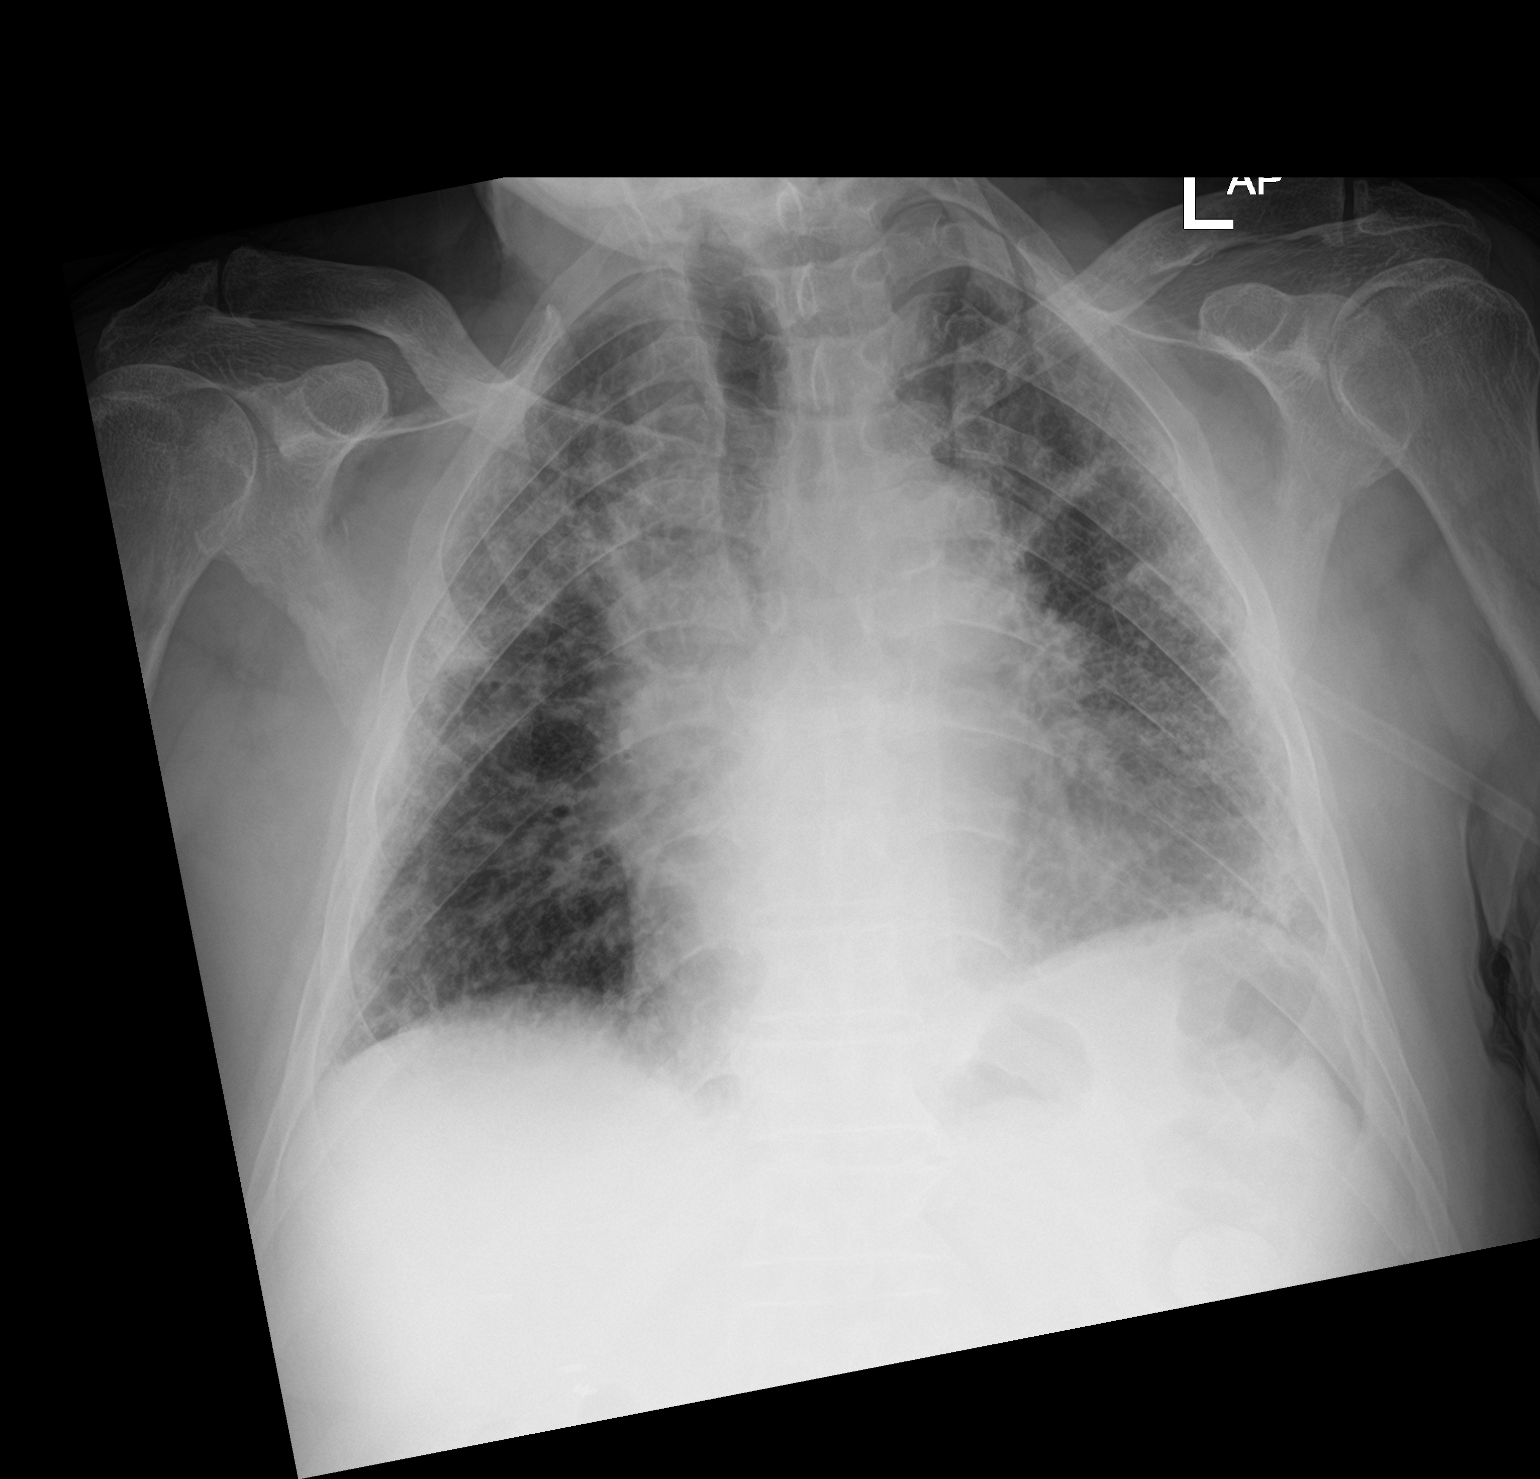

[2 of 2 positions shown; findings below may reference images not displayed]

FINDINGS: Diffuse bilateral interstitial and subpleural densities similar to
prior radiograph likely chronic and fibrosis. No new consolidative
changes. There is no pleural effusion or pneumothorax. Stable
cardiac silhouette. No acute osseous pathology.
IMPRESSION: No active cardiopulmonary disease.  No interval change.
